# Patient Record
Sex: Female | Born: 1950 | Race: Black or African American | Hispanic: No | Marital: Married | Smoking: Current every day smoker
Health system: Southern US, Community
[De-identification: ages and names within clinical notes are randomized; demographics above are authoritative.]

## PROBLEM LIST (undated history)

## (undated) DIAGNOSIS — N321 Vesicointestinal fistula: Secondary | ICD-10-CM

## (undated) DIAGNOSIS — K5792 Diverticulitis of intestine, part unspecified, without perforation or abscess without bleeding: Secondary | ICD-10-CM

## (undated) DIAGNOSIS — G459 Transient cerebral ischemic attack, unspecified: Secondary | ICD-10-CM

## (undated) DIAGNOSIS — K635 Polyp of colon: Secondary | ICD-10-CM

## (undated) DIAGNOSIS — J432 Centrilobular emphysema: Secondary | ICD-10-CM

## (undated) DIAGNOSIS — E785 Hyperlipidemia, unspecified: Secondary | ICD-10-CM

## (undated) HISTORY — PX: SPINE SURGERY: SHX786

## (undated) HISTORY — PX: CHOLECYSTECTOMY: SHX55

## (undated) HISTORY — PX: PARTIAL COLECTOMY: SHX5273

---

## 1999-10-07 ENCOUNTER — Encounter: Admission: RE | Admit: 1999-10-07 | Discharge: 1999-10-07 | Payer: Self-pay | Admitting: *Deleted

## 1999-10-07 ENCOUNTER — Encounter: Payer: Self-pay | Admitting: *Deleted

## 2000-09-30 ENCOUNTER — Other Ambulatory Visit: Admission: RE | Admit: 2000-09-30 | Discharge: 2000-09-30 | Payer: Self-pay | Admitting: Gynecology

## 2000-10-07 ENCOUNTER — Encounter: Payer: Self-pay | Admitting: Gynecology

## 2000-10-07 ENCOUNTER — Encounter: Admission: RE | Admit: 2000-10-07 | Discharge: 2000-10-07 | Payer: Self-pay | Admitting: Gynecology

## 2000-10-26 ENCOUNTER — Other Ambulatory Visit: Admission: RE | Admit: 2000-10-26 | Discharge: 2000-10-26 | Payer: Self-pay | Admitting: Gynecology

## 2001-10-18 ENCOUNTER — Encounter: Admission: RE | Admit: 2001-10-18 | Discharge: 2001-10-18 | Payer: Self-pay | Admitting: Gynecology

## 2001-10-18 ENCOUNTER — Encounter: Payer: Self-pay | Admitting: Gynecology

## 2001-11-01 ENCOUNTER — Other Ambulatory Visit: Admission: RE | Admit: 2001-11-01 | Discharge: 2001-11-01 | Payer: Self-pay | Admitting: Gynecology

## 2002-10-21 ENCOUNTER — Encounter: Admission: RE | Admit: 2002-10-21 | Discharge: 2002-10-21 | Payer: Self-pay | Admitting: Gynecology

## 2002-10-21 ENCOUNTER — Encounter: Payer: Self-pay | Admitting: Gynecology

## 2003-01-25 ENCOUNTER — Other Ambulatory Visit: Admission: RE | Admit: 2003-01-25 | Discharge: 2003-01-25 | Payer: Self-pay | Admitting: Gynecology

## 2003-10-27 ENCOUNTER — Ambulatory Visit (HOSPITAL_COMMUNITY): Admission: RE | Admit: 2003-10-27 | Discharge: 2003-10-27 | Payer: Self-pay | Admitting: Obstetrics and Gynecology

## 2004-02-01 ENCOUNTER — Other Ambulatory Visit: Admission: RE | Admit: 2004-02-01 | Discharge: 2004-02-01 | Payer: Self-pay | Admitting: Gynecology

## 2004-09-16 ENCOUNTER — Encounter (INDEPENDENT_AMBULATORY_CARE_PROVIDER_SITE_OTHER): Payer: Self-pay | Admitting: *Deleted

## 2004-09-16 ENCOUNTER — Ambulatory Visit (HOSPITAL_COMMUNITY): Admission: RE | Admit: 2004-09-16 | Discharge: 2004-09-16 | Payer: Self-pay | Admitting: Gastroenterology

## 2004-10-29 ENCOUNTER — Ambulatory Visit (HOSPITAL_COMMUNITY): Admission: RE | Admit: 2004-10-29 | Discharge: 2004-10-29 | Payer: Self-pay | Admitting: Gynecology

## 2004-11-11 ENCOUNTER — Encounter: Admission: RE | Admit: 2004-11-11 | Discharge: 2004-11-11 | Payer: Self-pay | Admitting: Internal Medicine

## 2005-01-16 ENCOUNTER — Encounter (INDEPENDENT_AMBULATORY_CARE_PROVIDER_SITE_OTHER): Payer: Self-pay | Admitting: Specialist

## 2005-01-16 ENCOUNTER — Inpatient Hospital Stay (HOSPITAL_COMMUNITY): Admission: RE | Admit: 2005-01-16 | Discharge: 2005-01-21 | Payer: Self-pay | Admitting: General Surgery

## 2005-03-05 ENCOUNTER — Other Ambulatory Visit: Admission: RE | Admit: 2005-03-05 | Discharge: 2005-03-05 | Payer: Self-pay | Admitting: Gynecology

## 2005-10-31 ENCOUNTER — Ambulatory Visit (HOSPITAL_COMMUNITY): Admission: RE | Admit: 2005-10-31 | Discharge: 2005-10-31 | Payer: Self-pay | Admitting: Gynecology

## 2006-01-20 ENCOUNTER — Ambulatory Visit (HOSPITAL_COMMUNITY): Admission: RE | Admit: 2006-01-20 | Discharge: 2006-01-20 | Payer: Self-pay | Admitting: Internal Medicine

## 2006-03-11 ENCOUNTER — Other Ambulatory Visit: Admission: RE | Admit: 2006-03-11 | Discharge: 2006-03-11 | Payer: Self-pay | Admitting: Gynecology

## 2006-11-03 ENCOUNTER — Ambulatory Visit (HOSPITAL_COMMUNITY): Admission: RE | Admit: 2006-11-03 | Discharge: 2006-11-03 | Payer: Self-pay | Admitting: Gynecology

## 2007-01-05 ENCOUNTER — Encounter: Admission: RE | Admit: 2007-01-05 | Discharge: 2007-01-05 | Payer: Self-pay | Admitting: Internal Medicine

## 2007-03-15 ENCOUNTER — Other Ambulatory Visit: Admission: RE | Admit: 2007-03-15 | Discharge: 2007-03-15 | Payer: Self-pay | Admitting: Gynecology

## 2007-11-11 ENCOUNTER — Ambulatory Visit (HOSPITAL_COMMUNITY): Admission: RE | Admit: 2007-11-11 | Discharge: 2007-11-11 | Payer: Self-pay | Admitting: Gynecology

## 2007-12-02 ENCOUNTER — Encounter: Admission: RE | Admit: 2007-12-02 | Discharge: 2007-12-02 | Payer: Self-pay | Admitting: Interventional Radiology

## 2008-03-16 ENCOUNTER — Other Ambulatory Visit: Admission: RE | Admit: 2008-03-16 | Discharge: 2008-03-16 | Payer: Self-pay | Admitting: Gynecology

## 2008-11-13 ENCOUNTER — Ambulatory Visit (HOSPITAL_COMMUNITY): Admission: RE | Admit: 2008-11-13 | Discharge: 2008-11-13 | Payer: Self-pay | Admitting: Gynecology

## 2008-12-20 ENCOUNTER — Ambulatory Visit (HOSPITAL_COMMUNITY): Admission: RE | Admit: 2008-12-20 | Discharge: 2008-12-20 | Payer: Self-pay | Admitting: Gynecology

## 2009-01-31 ENCOUNTER — Ambulatory Visit: Admission: RE | Admit: 2009-01-31 | Discharge: 2009-01-31 | Payer: Self-pay | Admitting: Gynecologic Oncology

## 2009-11-14 ENCOUNTER — Ambulatory Visit (HOSPITAL_COMMUNITY): Admission: RE | Admit: 2009-11-14 | Discharge: 2009-11-14 | Payer: Self-pay | Admitting: Gynecology

## 2010-02-12 ENCOUNTER — Encounter: Admission: RE | Admit: 2010-02-12 | Discharge: 2010-02-12 | Payer: Self-pay | Admitting: Internal Medicine

## 2010-11-19 ENCOUNTER — Other Ambulatory Visit (HOSPITAL_COMMUNITY): Payer: Self-pay | Admitting: Gynecology

## 2010-11-19 DIAGNOSIS — Z1231 Encounter for screening mammogram for malignant neoplasm of breast: Secondary | ICD-10-CM

## 2010-11-21 ENCOUNTER — Ambulatory Visit (HOSPITAL_COMMUNITY)
Admission: RE | Admit: 2010-11-21 | Discharge: 2010-11-21 | Disposition: A | Discharge status: Home / Self Care | Payer: BC Managed Care – PPO | Source: Ambulatory Visit | Attending: Gynecology | Admitting: Gynecology

## 2010-11-21 DIAGNOSIS — Z1231 Encounter for screening mammogram for malignant neoplasm of breast: Secondary | ICD-10-CM | POA: Insufficient documentation

## 2011-02-11 NOTE — Consult Note (Signed)
NAME:  Ashley Galloway, BROSIOUS NO.:  [id number]   MEDICAL RECORD NO.:  [id number]          PATIENT TYPE:  OUT   LOCATION:  GYN                          FACILITY:  [facility]   PHYSICIAN:  John T. Kyla Balzarine, M.D.    DATE OF BIRTH:  04-May-1951   DATE OF CONSULTATION:  DATE OF DISCHARGE:                                 CONSULTATION   CHIEF COMPLAINT:  This 60 year old nulligravida is seen at the request  of Dr. Chevis Pretty for a cyst with borderline CA-125 value.   HISTORY OF PRESENT ILLNESS:  The patient relates menopause at  approximately age 52.  She was on variety of HRT regimens including oral  estrogen and progesterone and a more recent estrogen patch.  She  discontinued this and had an episode of spotting.  Evaluation included  endometrial biopsy showing disordered proliferative endometrium and  ultrasound performed, March 24 that revealed small fibroids in the  posterior uterine wall ranging between 1.3 and 2.5 cm in maximum  diameter.  The endometrial thickness was 4 mm.  The right ovary  contained a complex cystic lesion with thin internal septations  measuring 3.2 x 2.8 x 2.9 cm with no internal blood flow identified and  no thickened septations, mural nodularity, or solid components.  The  left ovary was normal and there was no evidence of free fluid.  CA-125  value was 32.9.  On close questioning, the patient is otherwise  asymptomatic, with no further episodes of menopausal bleeding.  She has  negligible vasomotor symptoms since discontinuing HRT.  Bowel and  bladder functions are normal and she denies bloating or early satiety.  There is no family history suggesting BRCA mutation.  The patient used  oral contraceptives for 15 years prior to menopause and underwent BTL in  1989.   PAST MEDICAL HISTORY:  Hypertension, hyperlipidemia, prior tubal  ligation, open cholecystectomy in 2006, and partial colonic resection in  2006.  Remote T&A.   MEDICATIONS:  Triamterene/HCTZ,  lovastatin, potassium supplementation,  low-dose aspirin, and multivitamins.   ALLERGIES:  None known.   PERSONAL SOCIAL HISTORY:  Married, smokes one-half pack per day tobacco,  and denies illicit drug or ethanol use.   FAMILY HISTORY:  No know gynecologic malignancies; maternal first cousin  had breast cancer, but several maternal aunts and the patient's mother  had no malignancies.   REVIEW OF SYSTEMS:  Other than noted above, negative 10-point system  review.   PHYSICAL EXAMINATION:  VITAL SIGNS:  Weight 178 pounds and vital signs  stable/afebrile.  GENERAL:  The patient is anxious, alert, and oriented x3, in no acute  distress.  ENT:  Clear oropharynx, supple neck without goiter, and no scleral  icterus.  LUNGS:  Lung fields are clear.  HEART:  Regular rhythm with no JVD.  ABDOMEN:  Soft and benign with well-healed surgical incisions.  No  hernia, tenderness, ascites, or mass.  EXTREMITIES:  Full strength and range of motion with no cords, Homans,  or edema.  NEUROLOGIC:  Screen intact.  SKIN:  Without suspicious lesions.  PELVIC:  External genitalia, BUS, bladder, and urethra  are normal.  The  vagina and cervix are clear.  The cervix is mobile without tenderness or  lesion.  Bimanual rectovaginal examinations, uterus is upper limits in  size, there is vague fullness in the right adnexa, but no distinct mass.  There is no cul-de-sac nodularity in the contralateral adnexa.  The  adnexum is normal.   LABORATORY DATA:  Review of Dr. Corwin Levins records confirm CA-125 value and  ultrasound report.   ASSESSMENT:  A 3-cm and benign-appearing ovarian cyst with borderline CA-  125 value (32.9).   PLAN AND RECOMMENDATIONS:  I had a long talk with the patient and her  husband regarding our differential diagnosis and potential causes for  elevation of CA-125, including ovarian cancer of various benign  conditions.  It is likely that she has functional cyst of the ovary   precipitated by withdrawal of HRT.  I recommended that the patient  undergo followup ultrasound scan in approximately 8 weeks; if the cyst  is still present and CA-125 values following, she could then simply be  observed with a followup scan in approximately 6 months; if the cyst is  resolved no further followup would be required.  If her CA-125 is  elevated above 100, cyst enlarging are looking a little more worrisome,  I would then recommend an operative intervention.  To keep imaging  consistent, we would recommend that she follow up with Dr. Chevis Pretty and we  would be glad to see her back at any time on a p.r.n. basis.      John T. Kyla Balzarine, M.D.  Electronically Signed     JTS/MEDQ  D:  01/31/2009  T:  02/01/2009  Job:  [id number]   cc:   Deirdre Peer. Polite, M.D.   Leatha Gilding. Mezer, M.D.  Fax: [phone]   Telford Nab, R.N.  [address]  [place], [place] [address]

## 2011-02-14 NOTE — Op Note (Signed)
NAME:  Ashley Galloway, Ashley Galloway              ACCOUNT NO.:  [id number]   MEDICAL RECORD NO.:  [id number]          PATIENT TYPE:  AMB   LOCATION:  ENDO                         FACILITY:  [facility]   PHYSICIAN:  John C. Madilyn Fireman, M.D.    DATE OF BIRTH:  25-Apr-1951   DATE OF PROCEDURE:  09/16/2004  DATE OF DISCHARGE:                                 OPERATIVE REPORT   PROCEDURE:  Colonoscopy with polypectomy and biopsy.   INDICATIONS FOR PROCEDURE:  Average risk colon cancer screening.   DESCRIPTION OF PROCEDURE:  The patient was placed in the left lateral  decubitus position then placed on the pulse monitor with continuous low flow  oxygen delivered by nasal cannula. She was sedated with 75 mcg IV fentanyl  and 7 mg IV Versed. The Olympus video colonoscope was inserted into the  rectum and advanced to the cecum, confirmed by transillumination at  McBurney's point and visualization of the ileocecal valve and appendiceal  orifice. The prep was excellent. The cecum appeared normal with no masses,  polyps, diverticula or other mucosal abnormalities.  Within the ascending  colon, there was a soft, diffuse,  multilobulated somewhat complex polypoid  mass approximately 2 x 4 cm in diameter that was sessile with no stalk and  did not appear to be resectable endoscopically.  Multiple biopsies were  taken.  Overall, it had the appearance of a complex adenoma but had somewhat  atypical looking surface.  The remainder of the ascending and transverse  colon appeared normal with no further polyps, masses, diverticula or other  mucosal abnormalities.  Within the descending colon, there was a 1.5 cm  polyp at 40 cm removed by snare. A 1 cm polyp at 30 cm was also removed by  snare and sent in a separate specimen container. A 2 x 1.5 cm pedunculated  polyp was seen at 22 cm and sent in a third specimen container.  The  remainder of the rectosigmoid and rectum appeared normal with no further  abnormalities.  The scope was  then withdrawn and the patient returned to the  recovery room in stable condition.  She tolerated the procedure well and  there were no immediate complications.   IMPRESSION:  1.  Ascending colon polypoid mass doubt resectable endoscopically.  2.  Descending sigmoid and rectosigmoid polyps.   PLAN:  Await all biopsy results and if ascending colon polyp adenomatous  will probably need surgical resection.      JCH/MEDQ  D:  09/16/2004  T:  09/16/2004  Job:  [id number]   cc:   Darius Bump, M.D.  [address]  [place] [address]  Fax: [phone]

## 2011-02-14 NOTE — Op Note (Signed)
NAME:  Ashley Galloway, Ashley Galloway NO.:  [id number]   MEDICAL RECORD NO.:  [id number]          PATIENT TYPE:  INP   LOCATION:  0001                         FACILITY:  [facility]   PHYSICIAN:  Adolph Pollack, M.D.DATE OF BIRTH:  [birth year]   DATE OF PROCEDURE:  01/16/2005  DATE OF DISCHARGE:                                 OPERATIVE REPORT   PREOPERATIVE DIAGNOSES:  1.  Symptomatic cholelithiasis.  2.  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   POSTOPERATIVE DIAGNOSES:  1.  Symptomatic cholelithiasis.  2.  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   OPERATION/PROCEDURE:  1.  Laparoscopic cholecystectomy with a intraoperative cholangiogram.  2.  Laparoscopic-assisted right colectomy.   SURGEON:  Adolph Pollack, M.D.   ASSISTANT:  Leonie Man, M.D.   ANESTHESIA:  General.   INDICATIONS:  The patient is a 60 year old female who underwent screening  colonoscopy and has a large tubulovillous polyp in the right colon they  cannot removed endoscopically.  She also has symptomatic cholelithiasis.  She presents for the above procedures.   DESCRIPTION OF PROCEDURE:  She is seen in the holding area and then brought  to the operating room, placed supine on the operating table and  general  anesthetic was administered.  Foley catheter was placed in the bladder.  The  abdominal wall was sterilely prepped and draped.  A small subumbilical  incision was made through the skin and subcutaneous tissue, fascia, and  peritoneum layers.  Peritoneal cavity was entered under direct vision.  A  pursestring suture of 0 Vicryl was placed around the fascial edges.  A  Hasson trocar was introduced into the peritoneal cavity and pneumoperitoneum  was created by insufflation of the CO2 gas.   Next, a laparoscope was introduced.  Under direct vision a 10/11 mm trocar  was placed in the epigastrium to the left of midline.  Two 5 mm trocars were  then placed through right mid  lateral abdomen.  The patient was placed in  reverse Trendelenburg position, right side tilted slightly up.  The  gallbladder was very firm and intrahepatic.  It was difficult to grasp.  We  began trying to incise some of the peritoneum near the body of the  gallbladder.  The infundibulum went fairly close to the porta hepatis.  I  subsequently used the Harmonic scalpel and began at the dome of the fundus  of the gallbladder and began dissecting the fundus free with the Harmonic  scalpel.  A branch of the cystic artery immediately on the gallbladder began  bleeding and I clipped this.  I continued to go ahead and free the  gallbladder fundus first down to the body and infundibulum.  The gallbladder  was very firm.  We did enter the gallbladder and some stones were spilled  but I retrieved these.  There was also a very large stone impacted in the  neck of the gallbladder.  I then used very careful dissection to identify a  cystic entry branch of the cystic artery, clip it and divide it after  creating a window around it.  I used careful blunt dissection and continued  to free the gallbladder up until the only thing holding the gallbladder was  a cystic duct.  It was very short cystic duct as I could see its junction  with the common bile duct.  A small incision was made at the gallbladder-  cystic duct junction and a cholangiocatheter placed into the cystic duct and  cholangiogram was performed.   Under real time fluoroscopy, dilute contrast material was injected into the  cystic duct.  It was a very short cystic duct.  The common bile duct, common  hepatic and right and left hepatic ducts were all calcified.  The contrast  spilled rapidly into the duodenum.  There was no obvious evidence of  obstruction.  Final report is pending the radiologist's interpretation.   I removed the Cholangiocath.  I was able to place three clips proximally on  the cystic duct and divide it sharply.  The  gallbladder was placed in an  Endopouch bag.  I irrigated the raw surface of the liver.  Hemostasis was  actually adequate.  I did note no bile leak.  I put two pieces of Surgicel  on the raw surface of the liver.  The gallbladder was then removed to the  subumbilical port.  Irrigation was performed in the perihepatic area.  Blood  loss for the cholecystectomy was approximately 200 mL given the fact that  the gallbladder completely intrahepatic and we had to dissect it to the  liver.   Following this, I placed a 5 mm trocar in the left mid lateral abdomen.  I  then approached the hepatic flexure of the colon and divided the lateral  attachments using the Harmonic scalpel.  I then mobilized the right colon by  dividing the white line of Toldt and medializing the colon.  I further  mobilized the hepatic flexure and proximal transverse colon using the  Harmonic scalpel.  Once I felt I had adequate mobility, I removed the two 5  mm right mid abdominal trocars and made an incision between the two through  the skin and subcutaneous tissue and anterior fascia.  I retracted the  rectus muscle medially and divided the posterior fascia and perineum  entering the peritoneal cavity.  I then brought the cecum and distal ileum  up into the wound.  I divided the distal ileum just proximal to the anterior  mesenteric fatty area with the endo-GIA stapler and divided part of the  mesentery in between clamps and ligated the vessels with Vicryl ties.  I  subsequently then placed a stay suture on the staple line of the distal  ileum and dropped it back to the peritoneal cavity.  I then mobilized the  right colon and the proximal transverse colon up into the wound.  I  dissected the omentum away from the proximal transverse colon.  I divided  the proximal transverse colon with the endo-GIA stapler.  The remaining mesentery was divided between clamps and vessels were ligated.  The specimen  was taken off the  field.  I subsequently took the specimen to the back  table, opened it up and identified the large polypoid mass in the midline  ascending colon.   Gloves were then changed.  A side-to-side stabled anastomosis was then  performed with a layered cutting stapler.  The remaining enterotomy was  closed with a linear non-cutting stapler and this site was also oversewn  with real silk sutures.  I then reinforced the  distal portion of the  anterior staple line with a 3-0 silk suture.  The anastomosis was patent,  viable and under no tension.  It was dropped back into the abdominal cavity.   Again, gloves were changed.  The abdominal cavity was then irrigated.  I did  not detect any active bleeding.  Sponges were all removed from the abdominal  cavity, counted and correct.  I then closed the posterior fascia of the  extraction site incision with a running #1 PDS suture.  Anterior fascia was  also closed with a running #1 PDS suture.   The abdomen was reinsufflated.  The anastomosis was seen and a closure was  seen.  Minimal retained irrigation fluid was present and this was evacuated.  I looked up in the gallbladder fossa.  No bleeding or bile leakage was  noted.  I subsequently removed the remaining trocars.  The subumbilical  fascia defect was closed by tightening up and tying down the pursestring  suture.  The wounds were then irrigated and skin incisions closed with  staples followed by sterile dressings.   She tolerated the procedure well without any apparent complications.  Estimated blood loss was approximately 400 mL.  She was taken to the  recovery room in satisfactory condition.      TJR/MEDQ  D:  01/16/2005  T:  01/16/2005  Job:  [id number]   cc:   Darius Bump, M.D.  [address]  [place] [address]  Fax: [phone]   Everardo All. Madilyn Fireman, M.D.  [address]  [place]  [place] [address]  Fax: [phone]

## 2011-02-14 NOTE — H&P (Signed)
NAME:  Ashley Galloway, Ashley Galloway NO.:  [id number]   MEDICAL RECORD NO.:  [id number]          PATIENT TYPE:  INP   LOCATION:  0001                         [facility]ACILITY:  [facility]   PHYSICIAN:  Adolph Pollack, M.D.DATE OF BIRTH:  [birth year]   DATE OF ADMISSION:  01/16/2005  DATE OF DISCHARGE:                                HISTORY & PHYSICAL   REASON:  Elective cholecystectomy and partial colectomy.   HISTORY OF PRESENT ILLNESS:  Ashley Galloway is a 60 year old female who had a  screening colonoscopy and had a large sessile type polyp in the ascending  colon which could not be removed by way of colonoscopy.  It was biopsied and  showed a tubulovillous adenoma.  She has also been having some right upper  quadrant and has been known to have known gallstones and symptomatic  cholelithiasis.  She now presents for laparoscopic cholecystectomy and  laparoscopic assisted partial colectomy.   PAST MEDICAL HISTORY:  1.  Hypertension.  2.  Hyperlipidemia.  3.  Hormonal deficiency.   PAST SURGICAL HISTORY:  1.  Tubal ligation.  2.  Tonsillectomy.  3.  Repair of hammer toe and bunionectomy.   ALLERGIES:  None.   MEDICATIONS:  1.  Aspirin.  2.  Potassium.  3.  Triamterene/hydrochlorothiazide.  4.  Lovastatin.  5.  Hormone replacement.   SOCIAL HISTORY:  She is a former smoker.  No alcohol use.  Works for  [employer].   FAMILY HISTORY:  Positive for sickle cell disease, diabetes.  No colon  cancer.   REVIEW OF SYSTEMS:  Only positive for intermittent vaginal spotting if she  is not on her female hormone replacement.   PHYSICAL EXAMINATION:  GENERAL:  Moderately obese female in no acute  distress, pleasant and cooperative.  VITAL SIGNS:  Temperature, heart rate 58, blood pressure 110/60.  She is  about 5 feet 7 inches tall, weighs 203 pounds.  HEENT:  Eyes:  Extraocular motions intact.  No icterus.  NECK:  Supple without masses or obvious thyroid enlargement.  RESPIRATORY:  The breath sounds are equal and clear.  Respirations are  unlabored.  CARDIOVASCULAR:  Regular rate and rhythm.  No murmur heard.  No lower  extremity edema.  ABDOMEN:  Soft.  Small subumbilical incision.  No palpable masses.  No  organomegaly.  No hernias.  EXTREMITIES:  Full range of motion.   IMPRESSION:  1.  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  2.  Symptomatic cholelithiasis.  3.  Hypertension, well-controlled.   PLAN:  Laparoscopic cholecystectomy with cholangiogram and laparoscopic  assisted right colectomy.  We discussed the procedure and the risks  preoperatively.      TJR/MEDQ  D:  01/16/2005  T:  01/16/2005  Job:  [id number]

## 2011-02-14 NOTE — Discharge Summary (Signed)
NAME:  [name], Ashley Galloway NO.:  [id number]   MEDICAL RECORD NO.:  [id number]          PATIENT TYPE:  INP   LOCATION:  0342                         FACILITY:  [facility]   PHYSICIAN:  Adolph Pollack, M.D.DATE OF BIRTH:  [birth year]   DATE OF ADMISSION:  01/16/2005  DATE OF DISCHARGE:  01/21/2005                                 DISCHARGE SUMMARY   PRINCIPAL DISCHARGE DIAGNOSIS:  Tubulovillous adenoma of the right colon.   SECONDARY DIAGNOSES:  1.  Chronic calculus cholecystitis.  2.  Blood loss anemia.  3.  Hypertension.  4.  Mild postoperative ileus.   PROCEDURE:  Laparoscopic-assisted right colectomy and laparoscopic  cholecystectomy with intraoperative cholangiogram on January 16, 2005.   HISTORY OF PRESENT ILLNESS:  [name] is a 60 year old female who on  screening colonoscopy was found to have a large sessile-type polyp in the  ascending colon that could not be removed completely.  It showed a  tubulovillous adenoma.  She also has symptomatic cholelithiasis.  She was  admitted for the above procedures.   HOSPITAL COURSE:  She underwent the above procedures and tolerated these  well.  She did have a little bit of blood loss anemia with a hemoglobin of  10.9 but tolerated this well.  Her ileus slowly resolved, such that by her  third postoperative day she was taking full liquids.  She was tolerating  these well.  By her fifth postoperative day her bowels were moving.  She was  tolerating a side diet.  Her incisions were clean and intact.  She is able  to be discharged.   DISPOSITION:  Discharged to home on January 21, 2005.   FOLLOWUP:  She is to return to the office in a few days for staple removal.   DISCHARGE INSTRUCTIONS:  She is given discharge instructions as well as  medication for pain.   CONDITION ON DISCHARGE:  Satisfactory.      TJR/MEDQ  D:  02/04/2005  T:  02/04/2005  Job:  [id number]   cc:   Darius Bump, M.D.  [address]  [place] [address]  Fax: [phone]   Everardo All. Madilyn Fireman, M.D.  [address]  [place]  [place] [address]  Fax: [phone]

## 2011-11-24 ENCOUNTER — Other Ambulatory Visit: Payer: Self-pay | Admitting: Gynecology

## 2011-11-24 DIAGNOSIS — Z1231 Encounter for screening mammogram for malignant neoplasm of breast: Secondary | ICD-10-CM

## 2011-12-03 ENCOUNTER — Other Ambulatory Visit (HOSPITAL_COMMUNITY): Payer: Self-pay | Admitting: Gynecology

## 2011-12-03 DIAGNOSIS — Z1231 Encounter for screening mammogram for malignant neoplasm of breast: Secondary | ICD-10-CM

## 2011-12-29 ENCOUNTER — Ambulatory Visit (HOSPITAL_COMMUNITY)
Admission: RE | Admit: 2011-12-29 | Discharge: 2011-12-29 | Disposition: A | Discharge status: Home / Self Care | Payer: BC Managed Care – PPO | Source: Ambulatory Visit | Attending: Gynecology | Admitting: Gynecology

## 2011-12-29 DIAGNOSIS — Z1231 Encounter for screening mammogram for malignant neoplasm of breast: Secondary | ICD-10-CM | POA: Insufficient documentation

## 2012-12-13 ENCOUNTER — Other Ambulatory Visit (HOSPITAL_COMMUNITY): Payer: Self-pay | Admitting: Gynecology

## 2012-12-13 DIAGNOSIS — Z1231 Encounter for screening mammogram for malignant neoplasm of breast: Secondary | ICD-10-CM

## 2012-12-29 ENCOUNTER — Ambulatory Visit (HOSPITAL_COMMUNITY)
Admission: RE | Admit: 2012-12-29 | Discharge: 2012-12-29 | Disposition: A | Discharge status: Home / Self Care | Payer: BC Managed Care – PPO | Source: Ambulatory Visit | Attending: Gynecology | Admitting: Gynecology

## 2012-12-29 DIAGNOSIS — Z1231 Encounter for screening mammogram for malignant neoplasm of breast: Secondary | ICD-10-CM

## 2013-12-30 ENCOUNTER — Other Ambulatory Visit (HOSPITAL_COMMUNITY): Payer: Self-pay | Admitting: Gynecology

## 2013-12-30 DIAGNOSIS — Z1231 Encounter for screening mammogram for malignant neoplasm of breast: Secondary | ICD-10-CM

## 2014-01-06 ENCOUNTER — Ambulatory Visit (HOSPITAL_COMMUNITY)
Admission: RE | Admit: 2014-01-06 | Discharge: 2014-01-06 | Disposition: A | Discharge status: Home / Self Care | Payer: BC Managed Care – PPO | Source: Ambulatory Visit | Attending: Gynecology | Admitting: Gynecology

## 2014-01-06 DIAGNOSIS — Z1231 Encounter for screening mammogram for malignant neoplasm of breast: Secondary | ICD-10-CM | POA: Insufficient documentation

## 2015-01-15 ENCOUNTER — Other Ambulatory Visit (HOSPITAL_COMMUNITY): Payer: Self-pay | Admitting: Internal Medicine

## 2015-01-15 DIAGNOSIS — Z1231 Encounter for screening mammogram for malignant neoplasm of breast: Secondary | ICD-10-CM

## 2015-01-16 ENCOUNTER — Ambulatory Visit (HOSPITAL_COMMUNITY)
Admission: RE | Admit: 2015-01-16 | Discharge: 2015-01-16 | Disposition: A | Discharge status: Home / Self Care | Payer: BLUE CROSS/BLUE SHIELD | Source: Ambulatory Visit | Attending: Internal Medicine | Admitting: Internal Medicine

## 2015-01-16 DIAGNOSIS — Z1231 Encounter for screening mammogram for malignant neoplasm of breast: Secondary | ICD-10-CM | POA: Diagnosis not present

## 2015-04-27 ENCOUNTER — Ambulatory Visit (HOSPITAL_COMMUNITY)
Admission: RE | Admit: 2015-04-27 | Discharge: 2015-04-27 | Disposition: A | Discharge status: Home / Self Care | Payer: BLUE CROSS/BLUE SHIELD | Source: Ambulatory Visit | Attending: Internal Medicine | Admitting: Internal Medicine

## 2015-04-27 ENCOUNTER — Other Ambulatory Visit (HOSPITAL_COMMUNITY): Payer: Self-pay | Admitting: Internal Medicine

## 2015-04-27 DIAGNOSIS — M79604 Pain in right leg: Secondary | ICD-10-CM | POA: Diagnosis not present

## 2015-04-27 DIAGNOSIS — R52 Pain, unspecified: Secondary | ICD-10-CM | POA: Diagnosis not present

## 2015-04-27 NOTE — Progress Notes (Signed)
VASCULAR LAB PRELIMINARY  PRELIMINARY  PRELIMINARY  PRELIMINARY  Right lower extremity venous duplex completed.    Preliminary report:  Right:  No evidence of DVT, superficial thrombosis, or Baker's cyst.  Ulises Wolfinger, RVT 04/27/2015, 6:23 PM

## 2015-05-15 ENCOUNTER — Other Ambulatory Visit: Payer: Self-pay | Admitting: Gynecology

## 2015-05-16 LAB — CYTOLOGY - PAP

## 2015-05-31 ENCOUNTER — Other Ambulatory Visit: Payer: Self-pay | Admitting: Internal Medicine

## 2015-05-31 DIAGNOSIS — R29898 Other symptoms and signs involving the musculoskeletal system: Secondary | ICD-10-CM

## 2015-06-12 ENCOUNTER — Ambulatory Visit
Admission: RE | Admit: 2015-06-12 | Discharge: 2015-06-12 | Disposition: A | Discharge status: Home / Self Care | Payer: BLUE CROSS/BLUE SHIELD | Source: Ambulatory Visit | Attending: Internal Medicine | Admitting: Internal Medicine

## 2015-06-12 DIAGNOSIS — R29898 Other symptoms and signs involving the musculoskeletal system: Secondary | ICD-10-CM

## 2016-01-02 ENCOUNTER — Other Ambulatory Visit: Payer: Self-pay | Admitting: Internal Medicine

## 2016-01-02 DIAGNOSIS — E2839 Other primary ovarian failure: Secondary | ICD-10-CM

## 2016-01-02 DIAGNOSIS — Z1231 Encounter for screening mammogram for malignant neoplasm of breast: Secondary | ICD-10-CM

## 2016-01-22 ENCOUNTER — Other Ambulatory Visit: Payer: BLUE CROSS/BLUE SHIELD

## 2016-01-22 ENCOUNTER — Ambulatory Visit: Payer: BLUE CROSS/BLUE SHIELD

## 2016-01-25 ENCOUNTER — Ambulatory Visit
Admission: RE | Admit: 2016-01-25 | Discharge: 2016-01-25 | Disposition: A | Discharge status: Home / Self Care | Payer: BLUE CROSS/BLUE SHIELD | Source: Ambulatory Visit | Attending: Internal Medicine | Admitting: Internal Medicine

## 2016-01-25 DIAGNOSIS — Z1231 Encounter for screening mammogram for malignant neoplasm of breast: Secondary | ICD-10-CM

## 2016-01-25 DIAGNOSIS — E2839 Other primary ovarian failure: Secondary | ICD-10-CM

## 2016-04-29 DIAGNOSIS — R69 Illness, unspecified: Secondary | ICD-10-CM | POA: Diagnosis not present

## 2016-04-29 DIAGNOSIS — E7800 Pure hypercholesterolemia, unspecified: Secondary | ICD-10-CM | POA: Diagnosis not present

## 2016-04-29 DIAGNOSIS — L989 Disorder of the skin and subcutaneous tissue, unspecified: Secondary | ICD-10-CM | POA: Diagnosis not present

## 2016-04-29 DIAGNOSIS — I10 Essential (primary) hypertension: Secondary | ICD-10-CM | POA: Diagnosis not present

## 2016-06-20 DIAGNOSIS — L821 Other seborrheic keratosis: Secondary | ICD-10-CM | POA: Diagnosis not present

## 2016-06-20 DIAGNOSIS — L820 Inflamed seborrheic keratosis: Secondary | ICD-10-CM | POA: Diagnosis not present

## 2016-06-20 DIAGNOSIS — L918 Other hypertrophic disorders of the skin: Secondary | ICD-10-CM | POA: Diagnosis not present

## 2016-06-20 DIAGNOSIS — D2371 Other benign neoplasm of skin of right lower limb, including hip: Secondary | ICD-10-CM | POA: Diagnosis not present

## 2016-06-20 DIAGNOSIS — D2271 Melanocytic nevi of right lower limb, including hip: Secondary | ICD-10-CM | POA: Diagnosis not present

## 2016-06-22 DIAGNOSIS — R69 Illness, unspecified: Secondary | ICD-10-CM | POA: Diagnosis not present

## 2016-09-02 DIAGNOSIS — R69 Illness, unspecified: Secondary | ICD-10-CM | POA: Diagnosis not present

## 2016-09-06 DIAGNOSIS — I6529 Occlusion and stenosis of unspecified carotid artery: Secondary | ICD-10-CM | POA: Diagnosis not present

## 2016-09-06 DIAGNOSIS — Z0000 Encounter for general adult medical examination without abnormal findings: Secondary | ICD-10-CM | POA: Diagnosis not present

## 2016-09-06 DIAGNOSIS — E7800 Pure hypercholesterolemia, unspecified: Secondary | ICD-10-CM | POA: Diagnosis not present

## 2016-09-06 DIAGNOSIS — I10 Essential (primary) hypertension: Secondary | ICD-10-CM | POA: Diagnosis not present

## 2016-11-19 ENCOUNTER — Other Ambulatory Visit: Payer: Self-pay | Admitting: Internal Medicine

## 2016-11-19 DIAGNOSIS — E7800 Pure hypercholesterolemia, unspecified: Secondary | ICD-10-CM | POA: Diagnosis not present

## 2016-11-19 DIAGNOSIS — F17208 Nicotine dependence, unspecified, with other nicotine-induced disorders: Secondary | ICD-10-CM

## 2016-11-19 DIAGNOSIS — E663 Overweight: Secondary | ICD-10-CM | POA: Diagnosis not present

## 2016-11-19 DIAGNOSIS — R69 Illness, unspecified: Secondary | ICD-10-CM | POA: Diagnosis not present

## 2016-11-19 DIAGNOSIS — I10 Essential (primary) hypertension: Secondary | ICD-10-CM | POA: Diagnosis not present

## 2016-11-19 DIAGNOSIS — Z0000 Encounter for general adult medical examination without abnormal findings: Secondary | ICD-10-CM | POA: Diagnosis not present

## 2016-11-19 DIAGNOSIS — Z6830 Body mass index (BMI) 30.0-30.9, adult: Secondary | ICD-10-CM | POA: Diagnosis not present

## 2016-11-20 ENCOUNTER — Ambulatory Visit
Admission: RE | Admit: 2016-11-20 | Discharge: 2016-11-20 | Disposition: A | Discharge status: Home / Self Care | Payer: Medicare HMO | Source: Ambulatory Visit | Attending: Internal Medicine | Admitting: Internal Medicine

## 2016-11-20 DIAGNOSIS — F17208 Nicotine dependence, unspecified, with other nicotine-induced disorders: Secondary | ICD-10-CM

## 2016-11-20 DIAGNOSIS — R69 Illness, unspecified: Secondary | ICD-10-CM | POA: Diagnosis not present

## 2016-12-30 DIAGNOSIS — M5441 Lumbago with sciatica, right side: Secondary | ICD-10-CM | POA: Diagnosis not present

## 2017-01-06 DIAGNOSIS — R69 Illness, unspecified: Secondary | ICD-10-CM | POA: Diagnosis not present

## 2017-01-28 ENCOUNTER — Ambulatory Visit
Admission: RE | Admit: 2017-01-28 | Discharge: 2017-01-28 | Disposition: A | Discharge status: Home / Self Care | Payer: Medicare HMO | Source: Ambulatory Visit | Attending: Internal Medicine | Admitting: Internal Medicine

## 2017-01-28 ENCOUNTER — Other Ambulatory Visit: Payer: Self-pay | Admitting: Internal Medicine

## 2017-01-28 DIAGNOSIS — Z1231 Encounter for screening mammogram for malignant neoplasm of breast: Secondary | ICD-10-CM | POA: Diagnosis not present

## 2017-02-20 ENCOUNTER — Ambulatory Visit: Payer: Medicare HMO

## 2017-03-26 DIAGNOSIS — R69 Illness, unspecified: Secondary | ICD-10-CM | POA: Diagnosis not present

## 2017-03-26 DIAGNOSIS — M5431 Sciatica, right side: Secondary | ICD-10-CM | POA: Diagnosis not present

## 2017-04-09 ENCOUNTER — Other Ambulatory Visit: Payer: Self-pay | Admitting: Internal Medicine

## 2017-04-09 DIAGNOSIS — M545 Low back pain: Secondary | ICD-10-CM | POA: Diagnosis not present

## 2017-04-15 ENCOUNTER — Other Ambulatory Visit: Payer: Self-pay | Admitting: Internal Medicine

## 2017-04-15 ENCOUNTER — Ambulatory Visit
Admission: RE | Admit: 2017-04-15 | Discharge: 2017-04-15 | Disposition: A | Discharge status: Home / Self Care | Payer: Medicare HMO | Source: Ambulatory Visit | Attending: Internal Medicine | Admitting: Internal Medicine

## 2017-04-15 DIAGNOSIS — M47816 Spondylosis without myelopathy or radiculopathy, lumbar region: Secondary | ICD-10-CM | POA: Diagnosis not present

## 2017-04-15 DIAGNOSIS — M5489 Other dorsalgia: Secondary | ICD-10-CM

## 2017-04-18 ENCOUNTER — Ambulatory Visit
Admission: RE | Admit: 2017-04-18 | Discharge: 2017-04-18 | Disposition: A | Discharge status: Home / Self Care | Payer: Medicare HMO | Source: Ambulatory Visit | Attending: Internal Medicine | Admitting: Internal Medicine

## 2017-04-18 DIAGNOSIS — M5126 Other intervertebral disc displacement, lumbar region: Secondary | ICD-10-CM | POA: Diagnosis not present

## 2017-04-18 DIAGNOSIS — M545 Low back pain: Secondary | ICD-10-CM

## 2017-04-18 DIAGNOSIS — M48061 Spinal stenosis, lumbar region without neurogenic claudication: Secondary | ICD-10-CM | POA: Diagnosis not present

## 2017-05-18 DIAGNOSIS — M5416 Radiculopathy, lumbar region: Secondary | ICD-10-CM | POA: Diagnosis not present

## 2017-05-20 DIAGNOSIS — E663 Overweight: Secondary | ICD-10-CM | POA: Diagnosis not present

## 2017-05-20 DIAGNOSIS — M48061 Spinal stenosis, lumbar region without neurogenic claudication: Secondary | ICD-10-CM | POA: Diagnosis not present

## 2017-05-20 DIAGNOSIS — I10 Essential (primary) hypertension: Secondary | ICD-10-CM | POA: Diagnosis not present

## 2017-05-20 DIAGNOSIS — E7800 Pure hypercholesterolemia, unspecified: Secondary | ICD-10-CM | POA: Diagnosis not present

## 2017-05-20 DIAGNOSIS — R69 Illness, unspecified: Secondary | ICD-10-CM | POA: Diagnosis not present

## 2017-05-20 DIAGNOSIS — Z6830 Body mass index (BMI) 30.0-30.9, adult: Secondary | ICD-10-CM | POA: Diagnosis not present

## 2017-05-26 DIAGNOSIS — M545 Low back pain: Secondary | ICD-10-CM | POA: Diagnosis not present

## 2017-05-26 DIAGNOSIS — M6281 Muscle weakness (generalized): Secondary | ICD-10-CM | POA: Diagnosis not present

## 2017-05-26 DIAGNOSIS — M79604 Pain in right leg: Secondary | ICD-10-CM | POA: Diagnosis not present

## 2017-06-04 DIAGNOSIS — M6281 Muscle weakness (generalized): Secondary | ICD-10-CM | POA: Diagnosis not present

## 2017-06-04 DIAGNOSIS — M545 Low back pain: Secondary | ICD-10-CM | POA: Diagnosis not present

## 2017-06-04 DIAGNOSIS — M79604 Pain in right leg: Secondary | ICD-10-CM | POA: Diagnosis not present

## 2017-06-10 DIAGNOSIS — M79604 Pain in right leg: Secondary | ICD-10-CM | POA: Diagnosis not present

## 2017-06-10 DIAGNOSIS — M6281 Muscle weakness (generalized): Secondary | ICD-10-CM | POA: Diagnosis not present

## 2017-06-10 DIAGNOSIS — M545 Low back pain: Secondary | ICD-10-CM | POA: Diagnosis not present

## 2017-06-17 DIAGNOSIS — M79604 Pain in right leg: Secondary | ICD-10-CM | POA: Diagnosis not present

## 2017-06-17 DIAGNOSIS — M6281 Muscle weakness (generalized): Secondary | ICD-10-CM | POA: Diagnosis not present

## 2017-06-17 DIAGNOSIS — M545 Low back pain: Secondary | ICD-10-CM | POA: Diagnosis not present

## 2017-07-01 DIAGNOSIS — Z23 Encounter for immunization: Secondary | ICD-10-CM | POA: Diagnosis not present

## 2017-12-04 ENCOUNTER — Other Ambulatory Visit: Payer: Self-pay | Admitting: Internal Medicine

## 2017-12-04 DIAGNOSIS — R69 Illness, unspecified: Secondary | ICD-10-CM | POA: Diagnosis not present

## 2017-12-04 DIAGNOSIS — E2839 Other primary ovarian failure: Secondary | ICD-10-CM | POA: Diagnosis not present

## 2017-12-04 DIAGNOSIS — F17210 Nicotine dependence, cigarettes, uncomplicated: Secondary | ICD-10-CM

## 2017-12-04 DIAGNOSIS — E663 Overweight: Secondary | ICD-10-CM | POA: Diagnosis not present

## 2017-12-04 DIAGNOSIS — E7800 Pure hypercholesterolemia, unspecified: Secondary | ICD-10-CM | POA: Diagnosis not present

## 2017-12-04 DIAGNOSIS — Z0000 Encounter for general adult medical examination without abnormal findings: Secondary | ICD-10-CM | POA: Diagnosis not present

## 2017-12-04 DIAGNOSIS — I10 Essential (primary) hypertension: Secondary | ICD-10-CM | POA: Diagnosis not present

## 2017-12-04 DIAGNOSIS — I700 Atherosclerosis of aorta: Secondary | ICD-10-CM | POA: Diagnosis not present

## 2017-12-04 DIAGNOSIS — Z1389 Encounter for screening for other disorder: Secondary | ICD-10-CM | POA: Diagnosis not present

## 2017-12-16 DIAGNOSIS — Z0000 Encounter for general adult medical examination without abnormal findings: Secondary | ICD-10-CM | POA: Diagnosis not present

## 2017-12-21 ENCOUNTER — Ambulatory Visit
Admission: RE | Admit: 2017-12-21 | Discharge: 2017-12-21 | Disposition: A | Discharge status: Home / Self Care | Payer: Medicare HMO | Source: Ambulatory Visit | Attending: Internal Medicine | Admitting: Internal Medicine

## 2017-12-21 ENCOUNTER — Other Ambulatory Visit: Payer: Self-pay | Admitting: Internal Medicine

## 2017-12-21 DIAGNOSIS — R69 Illness, unspecified: Secondary | ICD-10-CM | POA: Diagnosis not present

## 2017-12-21 DIAGNOSIS — F17210 Nicotine dependence, cigarettes, uncomplicated: Secondary | ICD-10-CM

## 2017-12-21 DIAGNOSIS — Z1231 Encounter for screening mammogram for malignant neoplasm of breast: Secondary | ICD-10-CM

## 2018-01-04 DIAGNOSIS — R195 Other fecal abnormalities: Secondary | ICD-10-CM | POA: Diagnosis not present

## 2018-01-04 DIAGNOSIS — D509 Iron deficiency anemia, unspecified: Secondary | ICD-10-CM | POA: Diagnosis not present

## 2018-02-02 ENCOUNTER — Ambulatory Visit
Admission: RE | Admit: 2018-02-02 | Discharge: 2018-02-02 | Disposition: A | Discharge status: Home / Self Care | Payer: Medicare HMO | Source: Ambulatory Visit | Attending: Internal Medicine | Admitting: Internal Medicine

## 2018-02-02 DIAGNOSIS — Z1231 Encounter for screening mammogram for malignant neoplasm of breast: Secondary | ICD-10-CM | POA: Diagnosis not present

## 2018-04-06 DIAGNOSIS — M545 Low back pain: Secondary | ICD-10-CM | POA: Diagnosis not present

## 2018-06-08 DIAGNOSIS — R69 Illness, unspecified: Secondary | ICD-10-CM | POA: Diagnosis not present

## 2018-06-08 DIAGNOSIS — I700 Atherosclerosis of aorta: Secondary | ICD-10-CM | POA: Diagnosis not present

## 2018-06-08 DIAGNOSIS — Z23 Encounter for immunization: Secondary | ICD-10-CM | POA: Diagnosis not present

## 2018-06-08 DIAGNOSIS — E7800 Pure hypercholesterolemia, unspecified: Secondary | ICD-10-CM | POA: Diagnosis not present

## 2018-06-08 DIAGNOSIS — I10 Essential (primary) hypertension: Secondary | ICD-10-CM | POA: Diagnosis not present

## 2018-06-11 DIAGNOSIS — H5212 Myopia, left eye: Secondary | ICD-10-CM | POA: Diagnosis not present

## 2018-06-11 DIAGNOSIS — H25093 Other age-related incipient cataract, bilateral: Secondary | ICD-10-CM | POA: Diagnosis not present

## 2018-06-11 DIAGNOSIS — H40013 Open angle with borderline findings, low risk, bilateral: Secondary | ICD-10-CM | POA: Diagnosis not present

## 2018-06-11 DIAGNOSIS — H524 Presbyopia: Secondary | ICD-10-CM | POA: Diagnosis not present

## 2018-06-11 DIAGNOSIS — Z83511 Family history of glaucoma: Secondary | ICD-10-CM | POA: Diagnosis not present

## 2018-06-11 DIAGNOSIS — H52223 Regular astigmatism, bilateral: Secondary | ICD-10-CM | POA: Diagnosis not present

## 2018-06-18 DIAGNOSIS — H401131 Primary open-angle glaucoma, bilateral, mild stage: Secondary | ICD-10-CM | POA: Diagnosis not present

## 2018-07-12 DIAGNOSIS — H401131 Primary open-angle glaucoma, bilateral, mild stage: Secondary | ICD-10-CM | POA: Diagnosis not present

## 2018-08-12 DIAGNOSIS — R69 Illness, unspecified: Secondary | ICD-10-CM | POA: Diagnosis not present

## 2018-10-11 DIAGNOSIS — H401131 Primary open-angle glaucoma, bilateral, mild stage: Secondary | ICD-10-CM | POA: Diagnosis not present

## 2018-10-20 DIAGNOSIS — J209 Acute bronchitis, unspecified: Secondary | ICD-10-CM | POA: Diagnosis not present

## 2018-12-12 IMAGING — CT CT CHEST LUNG CANCER SCREENING LOW DOSE W/O CM
1 series · 10 of 10 positions shown, 13 images · non-contrast
Comparison: 11/20/2016.

CLINICAL DATA: Current smoker, 37 pack-year history, lung cancer
screening.

EXAM:
CT CHEST WITHOUT CONTRAST LOW-DOSE FOR LUNG CANCER SCREENING
TECHNIQUE: Multidetector CT imaging of the chest was performed following the
standard protocol without IV contrast.

[ct lung segmentation data · axial · 0.77mm/px · z∈[-324,-324]mm · 10 of 318 frames shown]
[frame 1/318  mediastinal]
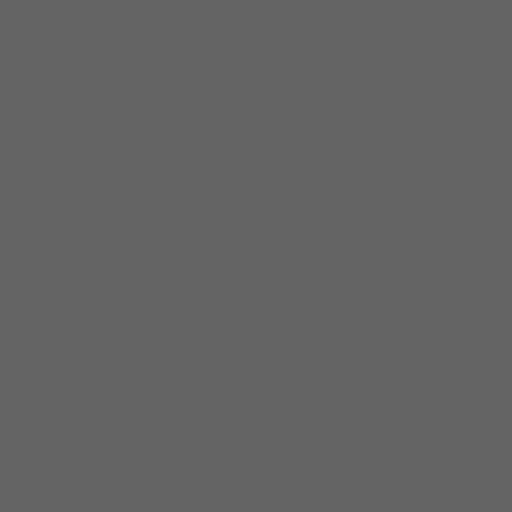
[frame 1/318  lung]
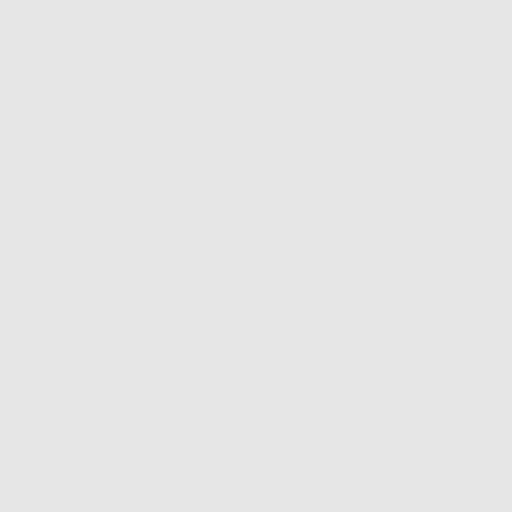
[frame 36/318  lung]
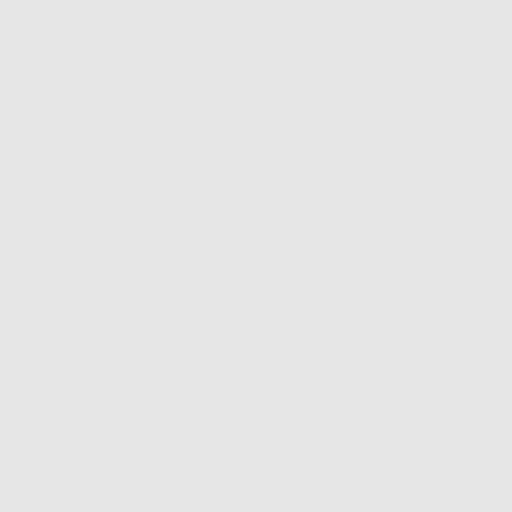
[frame 71/318  lung]
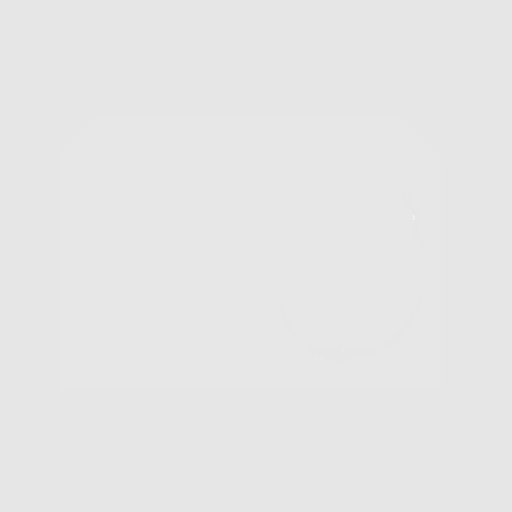
[frame 106/318  lung]
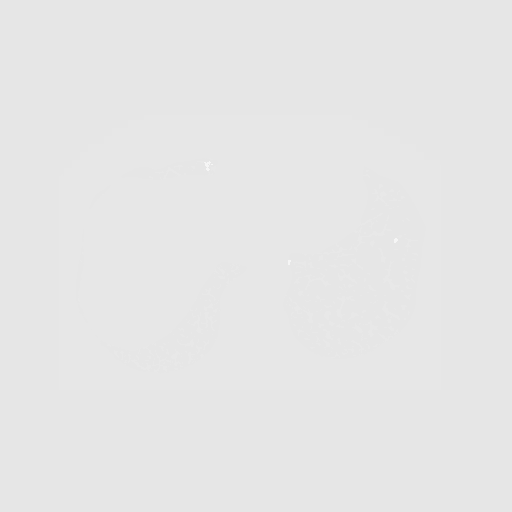
[frame 141/318  mediastinal]
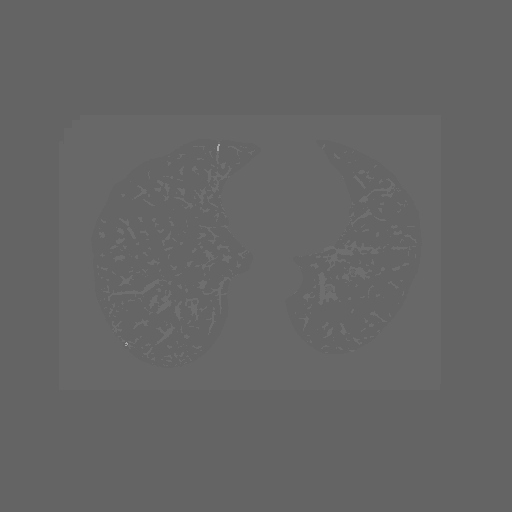
[frame 141/318  lung]
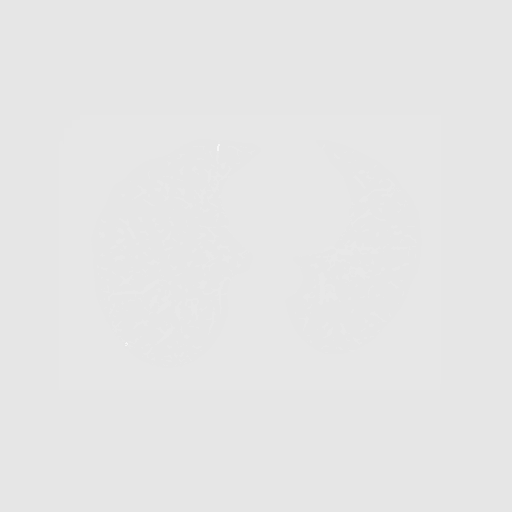
[frame 177/318  lung]
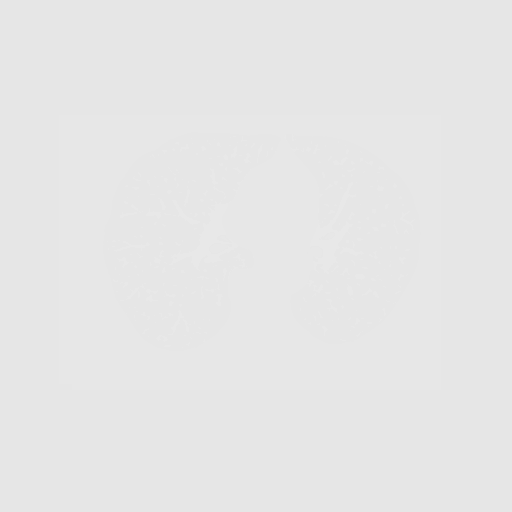
[frame 212/318  lung]
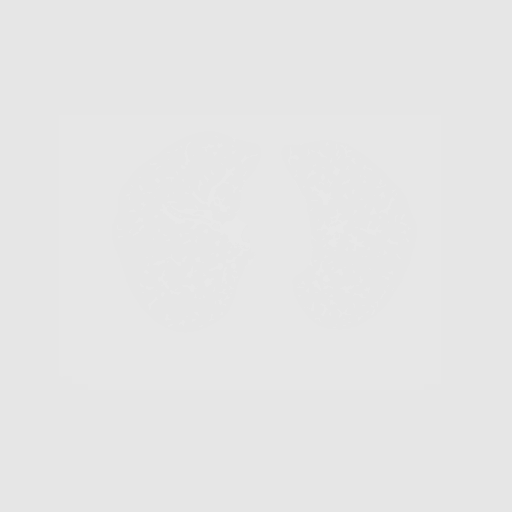
[frame 247/318  lung]
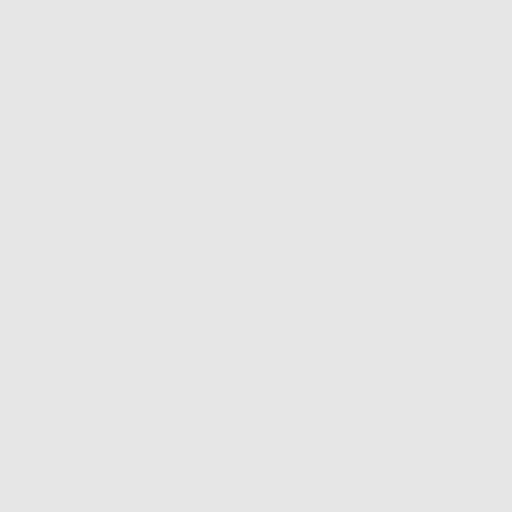
[frame 282/318  mediastinal]
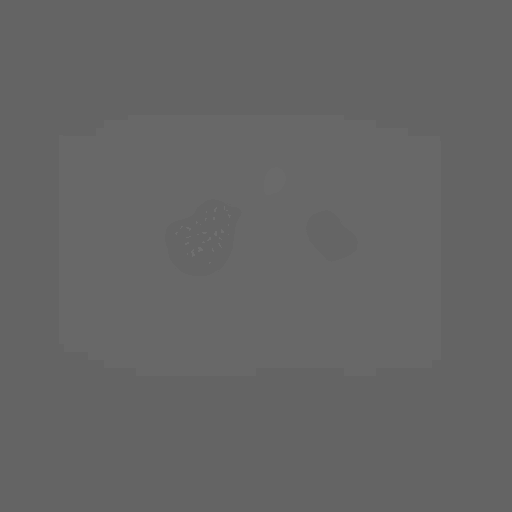
[frame 282/318  lung]
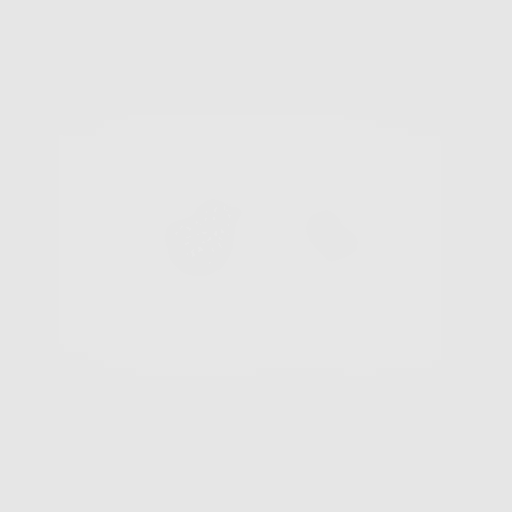
[frame 318/318  lung]
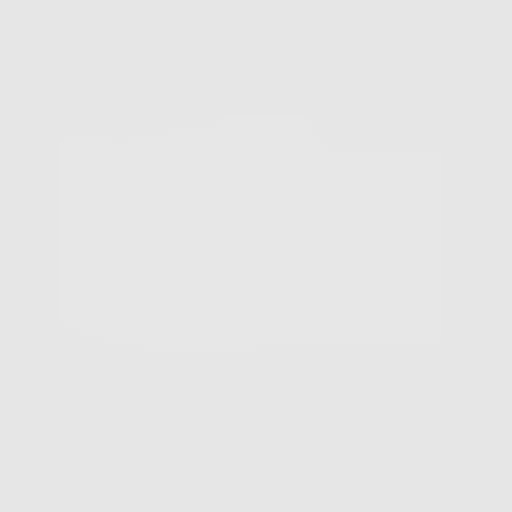

[10 of 10 positions shown; findings below may reference images not displayed]

FINDINGS: Cardiovascular: Atherosclerotic calcification of the arterial
vasculature, including coronary arteries. Heart size normal. No
pericardial effusion.

Mediastinum/Nodes: No pathologically enlarged mediastinal or
axillary lymph nodes. Hilar regions are difficult to evaluate
without IV contrast. Esophagus is grossly unremarkable.

Lungs/Pleura: Mild scattered scarring in the lung bases. 2 mm right
upper lobe pulmonary nodule, unchanged. No new pulmonary nodules. No
pleural fluid. Airway is unremarkable.

Upper Abdomen: Visualized portions of the liver, adrenal glands,
kidneys, spleen, pancreas, stomach and bowel are grossly
unremarkable. Cholecystectomy. No upper abdominal adenopathy.

Musculoskeletal: Degenerative changes in the spine. No worrisome
lytic or sclerotic lesions.
IMPRESSION: 1. Lung-RADS 2, benign appearance or behavior. Continue annual
screening with low-dose chest CT without contrast in 12 months.
2. Aortic atherosclerosis (937QQ-170.0). Coronary artery
calcification.

## 2018-12-29 ENCOUNTER — Other Ambulatory Visit: Payer: Self-pay | Admitting: Internal Medicine

## 2018-12-29 DIAGNOSIS — Z1231 Encounter for screening mammogram for malignant neoplasm of breast: Secondary | ICD-10-CM

## 2019-02-16 ENCOUNTER — Ambulatory Visit
Admission: RE | Admit: 2019-02-16 | Discharge: 2019-02-16 | Disposition: A | Discharge status: Home / Self Care | Payer: Medicare HMO | Source: Ambulatory Visit | Attending: Internal Medicine | Admitting: Internal Medicine

## 2019-02-16 ENCOUNTER — Other Ambulatory Visit: Payer: Self-pay

## 2019-02-16 DIAGNOSIS — Z1231 Encounter for screening mammogram for malignant neoplasm of breast: Secondary | ICD-10-CM

## 2019-02-18 DIAGNOSIS — Z20828 Contact with and (suspected) exposure to other viral communicable diseases: Secondary | ICD-10-CM | POA: Diagnosis not present

## 2019-02-24 ENCOUNTER — Ambulatory Visit: Payer: Medicare HMO

## 2019-05-04 DIAGNOSIS — K635 Polyp of colon: Secondary | ICD-10-CM | POA: Diagnosis not present

## 2019-05-04 DIAGNOSIS — Z980 Intestinal bypass and anastomosis status: Secondary | ICD-10-CM | POA: Diagnosis not present

## 2019-05-04 DIAGNOSIS — D123 Benign neoplasm of transverse colon: Secondary | ICD-10-CM | POA: Diagnosis not present

## 2019-05-04 DIAGNOSIS — D124 Benign neoplasm of descending colon: Secondary | ICD-10-CM | POA: Diagnosis not present

## 2019-05-04 DIAGNOSIS — K644 Residual hemorrhoidal skin tags: Secondary | ICD-10-CM | POA: Diagnosis not present

## 2019-05-04 DIAGNOSIS — D125 Benign neoplasm of sigmoid colon: Secondary | ICD-10-CM | POA: Diagnosis not present

## 2019-05-04 DIAGNOSIS — Z86010 Personal history of colonic polyps: Secondary | ICD-10-CM | POA: Diagnosis not present

## 2019-05-04 DIAGNOSIS — K5730 Diverticulosis of large intestine without perforation or abscess without bleeding: Secondary | ICD-10-CM | POA: Diagnosis not present

## 2019-05-06 DIAGNOSIS — D124 Benign neoplasm of descending colon: Secondary | ICD-10-CM | POA: Diagnosis not present

## 2019-05-06 DIAGNOSIS — D123 Benign neoplasm of transverse colon: Secondary | ICD-10-CM | POA: Diagnosis not present

## 2019-05-06 DIAGNOSIS — K635 Polyp of colon: Secondary | ICD-10-CM | POA: Diagnosis not present

## 2019-05-06 DIAGNOSIS — D125 Benign neoplasm of sigmoid colon: Secondary | ICD-10-CM | POA: Diagnosis not present

## 2019-06-28 ENCOUNTER — Other Ambulatory Visit: Payer: Self-pay | Admitting: Internal Medicine

## 2019-06-28 DIAGNOSIS — E7800 Pure hypercholesterolemia, unspecified: Secondary | ICD-10-CM | POA: Diagnosis not present

## 2019-06-28 DIAGNOSIS — I700 Atherosclerosis of aorta: Secondary | ICD-10-CM | POA: Diagnosis not present

## 2019-06-28 DIAGNOSIS — Z0000 Encounter for general adult medical examination without abnormal findings: Secondary | ICD-10-CM | POA: Diagnosis not present

## 2019-06-28 DIAGNOSIS — Z86010 Personal history of colonic polyps: Secondary | ICD-10-CM | POA: Diagnosis not present

## 2019-06-28 DIAGNOSIS — F17210 Nicotine dependence, cigarettes, uncomplicated: Secondary | ICD-10-CM

## 2019-06-28 DIAGNOSIS — I10 Essential (primary) hypertension: Secondary | ICD-10-CM | POA: Diagnosis not present

## 2019-06-28 DIAGNOSIS — Z23 Encounter for immunization: Secondary | ICD-10-CM | POA: Diagnosis not present

## 2019-06-28 DIAGNOSIS — R69 Illness, unspecified: Secondary | ICD-10-CM | POA: Diagnosis not present

## 2019-06-28 DIAGNOSIS — Z1389 Encounter for screening for other disorder: Secondary | ICD-10-CM | POA: Diagnosis not present

## 2019-06-29 DIAGNOSIS — Z0100 Encounter for examination of eyes and vision without abnormal findings: Secondary | ICD-10-CM | POA: Diagnosis not present

## 2019-06-30 ENCOUNTER — Other Ambulatory Visit: Payer: Self-pay | Admitting: Internal Medicine

## 2019-06-30 DIAGNOSIS — F17210 Nicotine dependence, cigarettes, uncomplicated: Secondary | ICD-10-CM

## 2019-07-04 ENCOUNTER — Ambulatory Visit: Payer: Medicare HMO

## 2019-07-05 ENCOUNTER — Ambulatory Visit
Admission: RE | Admit: 2019-07-05 | Discharge: 2019-07-05 | Disposition: A | Discharge status: Home / Self Care | Payer: Medicare HMO | Source: Ambulatory Visit | Attending: Internal Medicine | Admitting: Internal Medicine

## 2019-07-05 DIAGNOSIS — F17210 Nicotine dependence, cigarettes, uncomplicated: Secondary | ICD-10-CM

## 2019-07-05 DIAGNOSIS — Z87891 Personal history of nicotine dependence: Secondary | ICD-10-CM | POA: Diagnosis not present

## 2019-07-11 ENCOUNTER — Other Ambulatory Visit: Payer: Self-pay | Admitting: Internal Medicine

## 2019-07-11 ENCOUNTER — Ambulatory Visit
Admission: RE | Admit: 2019-07-11 | Discharge: 2019-07-11 | Disposition: A | Discharge status: Home / Self Care | Payer: Medicare HMO | Source: Ambulatory Visit | Attending: Internal Medicine | Admitting: Internal Medicine

## 2019-07-11 ENCOUNTER — Other Ambulatory Visit: Payer: Self-pay

## 2019-07-11 DIAGNOSIS — R1032 Left lower quadrant pain: Secondary | ICD-10-CM

## 2019-07-11 DIAGNOSIS — K5730 Diverticulosis of large intestine without perforation or abscess without bleeding: Secondary | ICD-10-CM | POA: Diagnosis not present

## 2019-07-11 MED ORDER — IOPAMIDOL (ISOVUE-300) INJECTION 61%
100.0000 mL | Freq: Once | INTRAVENOUS | Status: AC | PRN
  Administered 2019-07-11: 100 mL via INTRAVENOUS

## 2019-07-19 DIAGNOSIS — K630 Abscess of intestine: Secondary | ICD-10-CM | POA: Diagnosis not present

## 2019-07-19 DIAGNOSIS — K529 Noninfective gastroenteritis and colitis, unspecified: Secondary | ICD-10-CM | POA: Diagnosis not present

## 2019-08-04 ENCOUNTER — Other Ambulatory Visit: Payer: Self-pay | Admitting: Gastroenterology

## 2019-08-04 DIAGNOSIS — K5732 Diverticulitis of large intestine without perforation or abscess without bleeding: Secondary | ICD-10-CM | POA: Diagnosis not present

## 2019-08-04 DIAGNOSIS — N321 Vesicointestinal fistula: Secondary | ICD-10-CM | POA: Diagnosis not present

## 2019-08-04 DIAGNOSIS — K630 Abscess of intestine: Secondary | ICD-10-CM

## 2019-08-11 ENCOUNTER — Other Ambulatory Visit: Payer: Medicare HMO

## 2019-08-12 ENCOUNTER — Other Ambulatory Visit: Payer: Self-pay

## 2019-08-12 ENCOUNTER — Ambulatory Visit
Admission: RE | Admit: 2019-08-12 | Discharge: 2019-08-12 | Disposition: A | Discharge status: Home / Self Care | Payer: Medicare HMO | Source: Ambulatory Visit | Attending: Gastroenterology | Admitting: Gastroenterology

## 2019-08-12 DIAGNOSIS — K5732 Diverticulitis of large intestine without perforation or abscess without bleeding: Secondary | ICD-10-CM | POA: Diagnosis not present

## 2019-08-12 DIAGNOSIS — K630 Abscess of intestine: Secondary | ICD-10-CM

## 2019-08-12 MED ORDER — IOPAMIDOL (ISOVUE-300) INJECTION 61%
100.0000 mL | Freq: Once | INTRAVENOUS | Status: AC | PRN
  Administered 2019-08-12: 100 mL via INTRAVENOUS

## 2019-11-04 DIAGNOSIS — Z86010 Personal history of colonic polyps: Secondary | ICD-10-CM | POA: Diagnosis not present

## 2019-11-04 DIAGNOSIS — Z8719 Personal history of other diseases of the digestive system: Secondary | ICD-10-CM | POA: Diagnosis not present

## 2019-11-23 DIAGNOSIS — E7800 Pure hypercholesterolemia, unspecified: Secondary | ICD-10-CM | POA: Diagnosis not present

## 2019-11-23 DIAGNOSIS — I10 Essential (primary) hypertension: Secondary | ICD-10-CM | POA: Diagnosis not present

## 2019-11-23 DIAGNOSIS — E782 Mixed hyperlipidemia: Secondary | ICD-10-CM | POA: Diagnosis not present

## 2019-12-14 DIAGNOSIS — H401131 Primary open-angle glaucoma, bilateral, mild stage: Secondary | ICD-10-CM | POA: Diagnosis not present

## 2019-12-20 DIAGNOSIS — I10 Essential (primary) hypertension: Secondary | ICD-10-CM | POA: Diagnosis not present

## 2019-12-20 DIAGNOSIS — I700 Atherosclerosis of aorta: Secondary | ICD-10-CM | POA: Diagnosis not present

## 2019-12-20 DIAGNOSIS — E7800 Pure hypercholesterolemia, unspecified: Secondary | ICD-10-CM | POA: Diagnosis not present

## 2019-12-20 DIAGNOSIS — M48061 Spinal stenosis, lumbar region without neurogenic claudication: Secondary | ICD-10-CM | POA: Diagnosis not present

## 2019-12-20 DIAGNOSIS — R69 Illness, unspecified: Secondary | ICD-10-CM | POA: Diagnosis not present

## 2019-12-20 DIAGNOSIS — E663 Overweight: Secondary | ICD-10-CM | POA: Diagnosis not present

## 2020-01-09 ENCOUNTER — Other Ambulatory Visit: Payer: Self-pay | Admitting: Internal Medicine

## 2020-01-09 DIAGNOSIS — Z1231 Encounter for screening mammogram for malignant neoplasm of breast: Secondary | ICD-10-CM

## 2020-02-20 ENCOUNTER — Ambulatory Visit
Admission: RE | Admit: 2020-02-20 | Discharge: 2020-02-20 | Disposition: A | Discharge status: Home / Self Care | Payer: Medicare HMO | Source: Ambulatory Visit | Attending: Internal Medicine | Admitting: Internal Medicine

## 2020-02-20 ENCOUNTER — Other Ambulatory Visit: Payer: Self-pay

## 2020-02-20 DIAGNOSIS — Z1231 Encounter for screening mammogram for malignant neoplasm of breast: Secondary | ICD-10-CM

## 2020-03-10 DIAGNOSIS — Z0100 Encounter for examination of eyes and vision without abnormal findings: Secondary | ICD-10-CM | POA: Diagnosis not present

## 2020-03-10 DIAGNOSIS — R69 Illness, unspecified: Secondary | ICD-10-CM | POA: Diagnosis not present

## 2020-04-12 DIAGNOSIS — R69 Illness, unspecified: Secondary | ICD-10-CM | POA: Diagnosis not present

## 2020-05-24 ENCOUNTER — Other Ambulatory Visit: Payer: Self-pay | Admitting: Gastroenterology

## 2020-06-05 ENCOUNTER — Other Ambulatory Visit: Payer: Self-pay | Admitting: Gastroenterology

## 2020-06-05 DIAGNOSIS — N321 Vesicointestinal fistula: Secondary | ICD-10-CM

## 2020-06-05 DIAGNOSIS — R159 Full incontinence of feces: Secondary | ICD-10-CM

## 2020-06-11 ENCOUNTER — Other Ambulatory Visit: Payer: Self-pay | Admitting: Gastroenterology

## 2020-06-11 DIAGNOSIS — R159 Full incontinence of feces: Secondary | ICD-10-CM

## 2020-06-11 DIAGNOSIS — N321 Vesicointestinal fistula: Secondary | ICD-10-CM

## 2020-06-22 ENCOUNTER — Ambulatory Visit
Admission: RE | Admit: 2020-06-22 | Discharge: 2020-06-22 | Disposition: A | Discharge status: Home / Self Care | Payer: Medicare HMO | Source: Ambulatory Visit | Attending: Gastroenterology | Admitting: Gastroenterology

## 2020-06-22 ENCOUNTER — Other Ambulatory Visit: Payer: Self-pay

## 2020-06-22 DIAGNOSIS — K5732 Diverticulitis of large intestine without perforation or abscess without bleeding: Secondary | ICD-10-CM | POA: Diagnosis not present

## 2020-06-22 DIAGNOSIS — N321 Vesicointestinal fistula: Secondary | ICD-10-CM

## 2020-06-22 DIAGNOSIS — R159 Full incontinence of feces: Secondary | ICD-10-CM

## 2020-06-22 MED ORDER — IOPAMIDOL (ISOVUE-300) INJECTION 61%
100.0000 mL | Freq: Once | INTRAVENOUS | Status: AC | PRN
  Administered 2020-06-22: 100 mL via INTRAVENOUS

## 2020-06-24 DIAGNOSIS — R69 Illness, unspecified: Secondary | ICD-10-CM | POA: Diagnosis not present

## 2020-06-25 ENCOUNTER — Other Ambulatory Visit: Payer: Medicare HMO

## 2020-07-25 DIAGNOSIS — R69 Illness, unspecified: Secondary | ICD-10-CM | POA: Diagnosis not present

## 2020-07-25 DIAGNOSIS — M48061 Spinal stenosis, lumbar region without neurogenic claudication: Secondary | ICD-10-CM | POA: Diagnosis not present

## 2020-07-25 DIAGNOSIS — Z0000 Encounter for general adult medical examination without abnormal findings: Secondary | ICD-10-CM | POA: Diagnosis not present

## 2020-07-25 DIAGNOSIS — Z5181 Encounter for therapeutic drug level monitoring: Secondary | ICD-10-CM | POA: Diagnosis not present

## 2020-07-25 DIAGNOSIS — E7800 Pure hypercholesterolemia, unspecified: Secondary | ICD-10-CM | POA: Diagnosis not present

## 2020-07-25 DIAGNOSIS — I10 Essential (primary) hypertension: Secondary | ICD-10-CM | POA: Diagnosis not present

## 2020-07-25 DIAGNOSIS — E663 Overweight: Secondary | ICD-10-CM | POA: Diagnosis not present

## 2020-07-25 DIAGNOSIS — J432 Centrilobular emphysema: Secondary | ICD-10-CM | POA: Diagnosis not present

## 2020-07-25 DIAGNOSIS — I700 Atherosclerosis of aorta: Secondary | ICD-10-CM | POA: Diagnosis not present

## 2020-07-25 DIAGNOSIS — M6530 Trigger finger, unspecified finger: Secondary | ICD-10-CM | POA: Diagnosis not present

## 2020-08-01 DIAGNOSIS — M1812 Unilateral primary osteoarthritis of first carpometacarpal joint, left hand: Secondary | ICD-10-CM | POA: Diagnosis not present

## 2020-08-01 DIAGNOSIS — M65312 Trigger thumb, left thumb: Secondary | ICD-10-CM | POA: Diagnosis not present

## 2020-08-31 DIAGNOSIS — M6530 Trigger finger, unspecified finger: Secondary | ICD-10-CM | POA: Diagnosis not present

## 2020-08-31 DIAGNOSIS — M65351 Trigger finger, right little finger: Secondary | ICD-10-CM | POA: Diagnosis not present

## 2020-08-31 DIAGNOSIS — M65312 Trigger thumb, left thumb: Secondary | ICD-10-CM | POA: Diagnosis not present

## 2020-08-31 DIAGNOSIS — M19041 Primary osteoarthritis, right hand: Secondary | ICD-10-CM | POA: Diagnosis not present

## 2020-09-16 DIAGNOSIS — J432 Centrilobular emphysema: Secondary | ICD-10-CM | POA: Diagnosis not present

## 2020-09-16 DIAGNOSIS — E782 Mixed hyperlipidemia: Secondary | ICD-10-CM | POA: Diagnosis not present

## 2020-09-16 DIAGNOSIS — E7800 Pure hypercholesterolemia, unspecified: Secondary | ICD-10-CM | POA: Diagnosis not present

## 2020-09-16 DIAGNOSIS — I10 Essential (primary) hypertension: Secondary | ICD-10-CM | POA: Diagnosis not present

## 2020-10-12 DIAGNOSIS — M65351 Trigger finger, right little finger: Secondary | ICD-10-CM | POA: Diagnosis not present

## 2020-10-12 DIAGNOSIS — M65312 Trigger thumb, left thumb: Secondary | ICD-10-CM | POA: Diagnosis not present

## 2020-12-04 DIAGNOSIS — H401130 Primary open-angle glaucoma, bilateral, stage unspecified: Secondary | ICD-10-CM | POA: Diagnosis not present

## 2021-01-11 ENCOUNTER — Other Ambulatory Visit: Payer: Self-pay | Admitting: Internal Medicine

## 2021-01-11 DIAGNOSIS — Z1231 Encounter for screening mammogram for malignant neoplasm of breast: Secondary | ICD-10-CM

## 2021-01-24 DIAGNOSIS — J432 Centrilobular emphysema: Secondary | ICD-10-CM | POA: Diagnosis not present

## 2021-01-24 DIAGNOSIS — I10 Essential (primary) hypertension: Secondary | ICD-10-CM | POA: Diagnosis not present

## 2021-01-24 DIAGNOSIS — R69 Illness, unspecified: Secondary | ICD-10-CM | POA: Diagnosis not present

## 2021-01-24 DIAGNOSIS — E7800 Pure hypercholesterolemia, unspecified: Secondary | ICD-10-CM | POA: Diagnosis not present

## 2021-01-24 DIAGNOSIS — M5432 Sciatica, left side: Secondary | ICD-10-CM | POA: Diagnosis not present

## 2021-01-25 ENCOUNTER — Other Ambulatory Visit: Payer: Self-pay | Admitting: Internal Medicine

## 2021-01-25 DIAGNOSIS — F17210 Nicotine dependence, cigarettes, uncomplicated: Secondary | ICD-10-CM

## 2021-02-08 ENCOUNTER — Other Ambulatory Visit: Payer: Self-pay

## 2021-02-08 ENCOUNTER — Ambulatory Visit
Admission: RE | Admit: 2021-02-08 | Discharge: 2021-02-08 | Disposition: A | Discharge status: Home / Self Care | Payer: Medicare HMO | Source: Ambulatory Visit | Attending: Internal Medicine | Admitting: Internal Medicine

## 2021-02-08 DIAGNOSIS — R69 Illness, unspecified: Secondary | ICD-10-CM | POA: Diagnosis not present

## 2021-02-08 DIAGNOSIS — F17210 Nicotine dependence, cigarettes, uncomplicated: Secondary | ICD-10-CM

## 2021-02-19 DIAGNOSIS — Z23 Encounter for immunization: Secondary | ICD-10-CM | POA: Diagnosis not present

## 2021-02-26 ENCOUNTER — Other Ambulatory Visit: Payer: Self-pay | Admitting: Gastroenterology

## 2021-02-26 DIAGNOSIS — K5732 Diverticulitis of large intestine without perforation or abscess without bleeding: Secondary | ICD-10-CM

## 2021-02-28 ENCOUNTER — Ambulatory Visit
Admission: RE | Admit: 2021-02-28 | Discharge: 2021-02-28 | Disposition: A | Discharge status: Home / Self Care | Payer: Medicare HMO | Source: Ambulatory Visit | Attending: Gastroenterology | Admitting: Gastroenterology

## 2021-02-28 ENCOUNTER — Other Ambulatory Visit: Payer: Self-pay

## 2021-02-28 DIAGNOSIS — I700 Atherosclerosis of aorta: Secondary | ICD-10-CM | POA: Diagnosis not present

## 2021-02-28 DIAGNOSIS — K6389 Other specified diseases of intestine: Secondary | ICD-10-CM | POA: Diagnosis not present

## 2021-02-28 DIAGNOSIS — D259 Leiomyoma of uterus, unspecified: Secondary | ICD-10-CM | POA: Diagnosis not present

## 2021-02-28 DIAGNOSIS — K5732 Diverticulitis of large intestine without perforation or abscess without bleeding: Secondary | ICD-10-CM | POA: Diagnosis not present

## 2021-02-28 MED ORDER — IOPAMIDOL (ISOVUE-300) INJECTION 61%
100.0000 mL | Freq: Once | INTRAVENOUS | Status: AC | PRN
  Administered 2021-02-28: 100 mL via INTRAVENOUS

## 2021-03-05 ENCOUNTER — Ambulatory Visit: Payer: Medicare HMO

## 2021-04-26 ENCOUNTER — Other Ambulatory Visit: Payer: Self-pay

## 2021-04-26 ENCOUNTER — Ambulatory Visit
Admission: RE | Admit: 2021-04-26 | Discharge: 2021-04-26 | Disposition: A | Discharge status: Home / Self Care | Payer: Medicare HMO | Source: Ambulatory Visit | Attending: Internal Medicine | Admitting: Internal Medicine

## 2021-04-26 DIAGNOSIS — Z1231 Encounter for screening mammogram for malignant neoplasm of breast: Secondary | ICD-10-CM | POA: Diagnosis not present

## 2021-06-10 DIAGNOSIS — H52229 Regular astigmatism, unspecified eye: Secondary | ICD-10-CM | POA: Diagnosis not present

## 2021-06-10 DIAGNOSIS — H40009 Preglaucoma, unspecified, unspecified eye: Secondary | ICD-10-CM | POA: Diagnosis not present

## 2021-06-10 DIAGNOSIS — Z0100 Encounter for examination of eyes and vision without abnormal findings: Secondary | ICD-10-CM | POA: Diagnosis not present

## 2021-07-16 DIAGNOSIS — Z23 Encounter for immunization: Secondary | ICD-10-CM | POA: Diagnosis not present

## 2021-08-06 DIAGNOSIS — I10 Essential (primary) hypertension: Secondary | ICD-10-CM | POA: Diagnosis not present

## 2021-08-06 DIAGNOSIS — E663 Overweight: Secondary | ICD-10-CM | POA: Diagnosis not present

## 2021-08-06 DIAGNOSIS — I700 Atherosclerosis of aorta: Secondary | ICD-10-CM | POA: Diagnosis not present

## 2021-08-06 DIAGNOSIS — Z0000 Encounter for general adult medical examination without abnormal findings: Secondary | ICD-10-CM | POA: Diagnosis not present

## 2021-08-06 DIAGNOSIS — E2839 Other primary ovarian failure: Secondary | ICD-10-CM | POA: Diagnosis not present

## 2021-08-06 DIAGNOSIS — M25539 Pain in unspecified wrist: Secondary | ICD-10-CM | POA: Diagnosis not present

## 2021-08-06 DIAGNOSIS — J432 Centrilobular emphysema: Secondary | ICD-10-CM | POA: Diagnosis not present

## 2021-08-06 DIAGNOSIS — E7800 Pure hypercholesterolemia, unspecified: Secondary | ICD-10-CM | POA: Diagnosis not present

## 2021-08-06 DIAGNOSIS — R69 Illness, unspecified: Secondary | ICD-10-CM | POA: Diagnosis not present

## 2021-08-14 DIAGNOSIS — M1811 Unilateral primary osteoarthritis of first carpometacarpal joint, right hand: Secondary | ICD-10-CM | POA: Diagnosis not present

## 2021-08-14 DIAGNOSIS — R159 Full incontinence of feces: Secondary | ICD-10-CM | POA: Insufficient documentation

## 2021-08-14 DIAGNOSIS — J432 Centrilobular emphysema: Secondary | ICD-10-CM | POA: Insufficient documentation

## 2021-08-14 DIAGNOSIS — F419 Anxiety disorder, unspecified: Secondary | ICD-10-CM | POA: Insufficient documentation

## 2021-08-14 DIAGNOSIS — R253 Fasciculation: Secondary | ICD-10-CM | POA: Insufficient documentation

## 2021-08-14 DIAGNOSIS — R259 Unspecified abnormal involuntary movements: Secondary | ICD-10-CM | POA: Insufficient documentation

## 2021-08-14 DIAGNOSIS — M79641 Pain in right hand: Secondary | ICD-10-CM | POA: Diagnosis not present

## 2021-08-14 DIAGNOSIS — E2839 Other primary ovarian failure: Secondary | ICD-10-CM | POA: Insufficient documentation

## 2021-08-14 DIAGNOSIS — N321 Vesicointestinal fistula: Secondary | ICD-10-CM | POA: Insufficient documentation

## 2021-08-16 DIAGNOSIS — M79641 Pain in right hand: Secondary | ICD-10-CM | POA: Diagnosis not present

## 2021-10-09 DIAGNOSIS — M65351 Trigger finger, right little finger: Secondary | ICD-10-CM | POA: Diagnosis not present

## 2021-10-09 DIAGNOSIS — M1811 Unilateral primary osteoarthritis of first carpometacarpal joint, right hand: Secondary | ICD-10-CM | POA: Diagnosis not present

## 2021-12-04 DIAGNOSIS — M180 Bilateral primary osteoarthritis of first carpometacarpal joints: Secondary | ICD-10-CM | POA: Diagnosis not present

## 2022-01-16 DIAGNOSIS — Z0100 Encounter for examination of eyes and vision without abnormal findings: Secondary | ICD-10-CM | POA: Diagnosis not present

## 2022-02-04 DIAGNOSIS — I10 Essential (primary) hypertension: Secondary | ICD-10-CM | POA: Diagnosis not present

## 2022-02-04 DIAGNOSIS — R69 Illness, unspecified: Secondary | ICD-10-CM | POA: Diagnosis not present

## 2022-02-04 DIAGNOSIS — E2839 Other primary ovarian failure: Secondary | ICD-10-CM | POA: Diagnosis not present

## 2022-02-04 DIAGNOSIS — E7800 Pure hypercholesterolemia, unspecified: Secondary | ICD-10-CM | POA: Diagnosis not present

## 2022-02-17 ENCOUNTER — Other Ambulatory Visit: Payer: Self-pay | Admitting: Internal Medicine

## 2022-02-17 DIAGNOSIS — E2839 Other primary ovarian failure: Secondary | ICD-10-CM

## 2022-03-18 ENCOUNTER — Other Ambulatory Visit: Payer: Self-pay | Admitting: Internal Medicine

## 2022-03-18 ENCOUNTER — Ambulatory Visit
Admission: RE | Admit: 2022-03-18 | Discharge: 2022-03-18 | Disposition: A | Discharge status: Home / Self Care | Payer: Medicare HMO | Source: Ambulatory Visit | Attending: Internal Medicine | Admitting: Internal Medicine

## 2022-03-18 DIAGNOSIS — G459 Transient cerebral ischemic attack, unspecified: Secondary | ICD-10-CM

## 2022-03-18 DIAGNOSIS — R29898 Other symptoms and signs involving the musculoskeletal system: Secondary | ICD-10-CM | POA: Diagnosis not present

## 2022-03-18 DIAGNOSIS — R109 Unspecified abdominal pain: Secondary | ICD-10-CM | POA: Diagnosis not present

## 2022-03-18 DIAGNOSIS — R531 Weakness: Secondary | ICD-10-CM | POA: Diagnosis not present

## 2022-03-19 ENCOUNTER — Other Ambulatory Visit: Payer: Self-pay | Admitting: Internal Medicine

## 2022-03-19 DIAGNOSIS — Z1231 Encounter for screening mammogram for malignant neoplasm of breast: Secondary | ICD-10-CM

## 2022-03-28 DIAGNOSIS — G459 Transient cerebral ischemic attack, unspecified: Secondary | ICD-10-CM | POA: Diagnosis not present

## 2022-03-28 DIAGNOSIS — I6523 Occlusion and stenosis of bilateral carotid arteries: Secondary | ICD-10-CM | POA: Diagnosis not present

## 2022-04-05 ENCOUNTER — Other Ambulatory Visit: Payer: Self-pay | Admitting: Cardiology

## 2022-04-08 LAB — LIPID PANEL W/O CHOL/HDL RATIO
Cholesterol, Total: 187 mg/dL (ref 100–199)
HDL: 69 mg/dL (ref >39)
LDL Chol Calc (NIH): 94 mg/dL (ref 0–99)
Triglycerides: 141 mg/dL (ref 0–149)
VLDL Cholesterol Cal: 24 mg/dL (ref 5–40)

## 2022-04-18 DIAGNOSIS — R140 Abdominal distension (gaseous): Secondary | ICD-10-CM | POA: Diagnosis not present

## 2022-04-18 DIAGNOSIS — K5792 Diverticulitis of intestine, part unspecified, without perforation or abscess without bleeding: Secondary | ICD-10-CM | POA: Diagnosis not present

## 2022-04-18 DIAGNOSIS — Z86010 Personal history of colonic polyps: Secondary | ICD-10-CM | POA: Diagnosis not present

## 2022-04-22 ENCOUNTER — Telehealth: Payer: Self-pay

## 2022-04-22 NOTE — Telephone Encounter (Signed)
Called patient via to give lab results from free screening.  Total Cholesterol: 187 Triglycerides: 141 HDL Cholesterol: 69 LDL Cholesterol: 94   Patient voiced understanding of normal lab results.

## 2022-04-28 ENCOUNTER — Ambulatory Visit
Admission: RE | Admit: 2022-04-28 | Discharge: 2022-04-28 | Disposition: A | Discharge status: Home / Self Care | Payer: Medicare Other | Source: Ambulatory Visit | Attending: Internal Medicine | Admitting: Internal Medicine

## 2022-04-28 DIAGNOSIS — Z1231 Encounter for screening mammogram for malignant neoplasm of breast: Secondary | ICD-10-CM | POA: Diagnosis not present

## 2022-06-18 DIAGNOSIS — H524 Presbyopia: Secondary | ICD-10-CM | POA: Diagnosis not present

## 2022-06-20 DIAGNOSIS — H524 Presbyopia: Secondary | ICD-10-CM | POA: Diagnosis not present

## 2022-07-08 DIAGNOSIS — K5289 Other specified noninfective gastroenteritis and colitis: Secondary | ICD-10-CM | POA: Diagnosis not present

## 2022-07-08 DIAGNOSIS — K648 Other hemorrhoids: Secondary | ICD-10-CM | POA: Diagnosis not present

## 2022-07-08 DIAGNOSIS — Z86010 Personal history of colonic polyps: Secondary | ICD-10-CM | POA: Diagnosis not present

## 2022-07-08 DIAGNOSIS — K5730 Diverticulosis of large intestine without perforation or abscess without bleeding: Secondary | ICD-10-CM | POA: Diagnosis not present

## 2022-07-08 DIAGNOSIS — Z09 Encounter for follow-up examination after completed treatment for conditions other than malignant neoplasm: Secondary | ICD-10-CM | POA: Diagnosis not present

## 2022-07-08 DIAGNOSIS — K529 Noninfective gastroenteritis and colitis, unspecified: Secondary | ICD-10-CM | POA: Diagnosis not present

## 2022-07-08 DIAGNOSIS — Z980 Intestinal bypass and anastomosis status: Secondary | ICD-10-CM | POA: Diagnosis not present

## 2022-07-08 DIAGNOSIS — D123 Benign neoplasm of transverse colon: Secondary | ICD-10-CM | POA: Diagnosis not present

## 2022-07-08 DIAGNOSIS — K6389 Other specified diseases of intestine: Secondary | ICD-10-CM | POA: Diagnosis not present

## 2022-07-10 DIAGNOSIS — D123 Benign neoplasm of transverse colon: Secondary | ICD-10-CM | POA: Diagnosis not present

## 2022-07-10 DIAGNOSIS — K5289 Other specified noninfective gastroenteritis and colitis: Secondary | ICD-10-CM | POA: Diagnosis not present

## 2022-08-08 ENCOUNTER — Ambulatory Visit
Admission: RE | Admit: 2022-08-08 | Discharge: 2022-08-08 | Disposition: A | Discharge status: Home / Self Care | Payer: Medicare Other | Source: Ambulatory Visit | Attending: Internal Medicine | Admitting: Internal Medicine

## 2022-08-08 DIAGNOSIS — Z780 Asymptomatic menopausal state: Secondary | ICD-10-CM | POA: Diagnosis not present

## 2022-08-08 DIAGNOSIS — E2839 Other primary ovarian failure: Secondary | ICD-10-CM

## 2022-08-12 ENCOUNTER — Encounter: Payer: Self-pay | Admitting: *Deleted

## 2022-08-12 NOTE — Progress Notes (Signed)
Unable to obtain current phone number. Pt [employer] claims data indicates pt saw PCP Dr. Delfina Redwood, with [facility], 6/30/23[facility] CM contacted to determine if pt has access to (still sees) PCP, Dr. Delfina Redwood. Janalyn Shy, [facility] CM RN, confirmed pt still being followed by Dr. Delfina Redwood as her PCP and that Dr. Delfina Redwood is aware of pt's follow up by oncology and GI as documented in [facility] records. Pt seen by Dr. Delfina Redwood as noted on 03/28/22. No additional 5-in-5 health equity team follow up needs noted so no additional follow-up no call or letter to pt indicated at this time per [facility].

## 2022-09-02 DIAGNOSIS — E7800 Pure hypercholesterolemia, unspecified: Secondary | ICD-10-CM | POA: Diagnosis not present

## 2022-09-02 DIAGNOSIS — J432 Centrilobular emphysema: Secondary | ICD-10-CM | POA: Diagnosis not present

## 2022-09-02 DIAGNOSIS — R69 Illness, unspecified: Secondary | ICD-10-CM | POA: Diagnosis not present

## 2022-09-02 DIAGNOSIS — R001 Bradycardia, unspecified: Secondary | ICD-10-CM | POA: Diagnosis not present

## 2022-09-02 DIAGNOSIS — Z0000 Encounter for general adult medical examination without abnormal findings: Secondary | ICD-10-CM | POA: Diagnosis not present

## 2022-09-02 DIAGNOSIS — M48061 Spinal stenosis, lumbar region without neurogenic claudication: Secondary | ICD-10-CM | POA: Diagnosis not present

## 2022-09-02 DIAGNOSIS — I10 Essential (primary) hypertension: Secondary | ICD-10-CM | POA: Diagnosis not present

## 2022-09-02 DIAGNOSIS — I700 Atherosclerosis of aorta: Secondary | ICD-10-CM | POA: Diagnosis not present

## 2022-09-24 DIAGNOSIS — R001 Bradycardia, unspecified: Secondary | ICD-10-CM | POA: Diagnosis not present

## 2022-09-24 DIAGNOSIS — U071 COVID-19: Secondary | ICD-10-CM | POA: Diagnosis not present

## 2022-09-24 DIAGNOSIS — R6883 Chills (without fever): Secondary | ICD-10-CM | POA: Diagnosis not present

## 2022-09-24 DIAGNOSIS — R519 Headache, unspecified: Secondary | ICD-10-CM | POA: Diagnosis not present

## 2022-10-07 DIAGNOSIS — R001 Bradycardia, unspecified: Secondary | ICD-10-CM | POA: Diagnosis not present

## 2022-12-30 DIAGNOSIS — R001 Bradycardia, unspecified: Secondary | ICD-10-CM | POA: Diagnosis not present

## 2023-03-10 ENCOUNTER — Other Ambulatory Visit: Payer: Self-pay | Admitting: Internal Medicine

## 2023-03-10 DIAGNOSIS — F17210 Nicotine dependence, cigarettes, uncomplicated: Secondary | ICD-10-CM

## 2023-03-10 DIAGNOSIS — I700 Atherosclerosis of aorta: Secondary | ICD-10-CM | POA: Diagnosis not present

## 2023-03-10 DIAGNOSIS — I10 Essential (primary) hypertension: Secondary | ICD-10-CM | POA: Diagnosis not present

## 2023-03-10 DIAGNOSIS — E7800 Pure hypercholesterolemia, unspecified: Secondary | ICD-10-CM | POA: Diagnosis not present

## 2023-03-10 DIAGNOSIS — J432 Centrilobular emphysema: Secondary | ICD-10-CM | POA: Diagnosis not present

## 2023-03-17 ENCOUNTER — Encounter: Payer: Self-pay | Admitting: Internal Medicine

## 2023-03-20 ENCOUNTER — Other Ambulatory Visit: Payer: Self-pay | Admitting: Internal Medicine

## 2023-03-20 DIAGNOSIS — Z1231 Encounter for screening mammogram for malignant neoplasm of breast: Secondary | ICD-10-CM

## 2023-03-27 ENCOUNTER — Ambulatory Visit
Admission: RE | Admit: 2023-03-27 | Discharge: 2023-03-27 | Disposition: A | Discharge status: Home / Self Care | Payer: Medicare Other | Source: Ambulatory Visit | Attending: Internal Medicine | Admitting: Internal Medicine

## 2023-03-27 DIAGNOSIS — F17210 Nicotine dependence, cigarettes, uncomplicated: Secondary | ICD-10-CM

## 2023-04-30 ENCOUNTER — Ambulatory Visit
Admission: RE | Admit: 2023-04-30 | Discharge: 2023-04-30 | Disposition: A | Discharge status: Home / Self Care | Payer: Medicare Other | Source: Ambulatory Visit | Attending: Internal Medicine | Admitting: Internal Medicine

## 2023-04-30 DIAGNOSIS — Z1231 Encounter for screening mammogram for malignant neoplasm of breast: Secondary | ICD-10-CM

## 2023-06-22 DIAGNOSIS — H2513 Age-related nuclear cataract, bilateral: Secondary | ICD-10-CM | POA: Diagnosis not present

## 2023-06-22 DIAGNOSIS — H52223 Regular astigmatism, bilateral: Secondary | ICD-10-CM | POA: Diagnosis not present

## 2023-06-22 DIAGNOSIS — H524 Presbyopia: Secondary | ICD-10-CM | POA: Diagnosis not present

## 2023-06-22 DIAGNOSIS — H401132 Primary open-angle glaucoma, bilateral, moderate stage: Secondary | ICD-10-CM | POA: Diagnosis not present

## 2023-06-22 DIAGNOSIS — H5203 Hypermetropia, bilateral: Secondary | ICD-10-CM | POA: Diagnosis not present

## 2023-08-02 NOTE — Progress Notes (Signed)
Pt states she will speak with PCP at next visit about low heart rate

## 2023-09-02 ENCOUNTER — Encounter: Payer: Self-pay | Admitting: *Deleted

## 2023-09-02 NOTE — Progress Notes (Signed)
Pt attended 08/02/23 screening event where her b/p was 137/68. At the event, the pt confirmed her PCP is Dr. Renford Dills at [facility] [place], also that she had insurance and she did not identify any SDOH insecurities. Chart review reveals via claims data that pt last saw Dr. Nehemiah Settle for an office visit on 03/10/23 and that she has had ongoing communication with his office for med refill and hypnotherapist referral. Future appt for Dr. Cristela Felt office are not visible in [facility] to confirm future appt dates. No additional health equity team support indicated at this time.

## 2023-09-14 ENCOUNTER — Other Ambulatory Visit: Payer: Self-pay

## 2023-09-14 ENCOUNTER — Inpatient Hospital Stay (HOSPITAL_BASED_OUTPATIENT_CLINIC_OR_DEPARTMENT_OTHER)
Admission: EM | Admit: 2023-09-14 | Discharge: 2023-09-19 | DRG: 442 | Disposition: A | Discharge status: Home Health | Payer: Medicare HMO | Attending: Internal Medicine | Admitting: Internal Medicine

## 2023-09-14 ENCOUNTER — Emergency Department (HOSPITAL_BASED_OUTPATIENT_CLINIC_OR_DEPARTMENT_OTHER): Payer: Medicare HMO

## 2023-09-14 DIAGNOSIS — R9389 Abnormal findings on diagnostic imaging of other specified body structures: Secondary | ICD-10-CM | POA: Diagnosis not present

## 2023-09-14 DIAGNOSIS — R531 Weakness: Secondary | ICD-10-CM | POA: Diagnosis not present

## 2023-09-14 DIAGNOSIS — J9811 Atelectasis: Secondary | ICD-10-CM | POA: Diagnosis present

## 2023-09-14 DIAGNOSIS — M5410 Radiculopathy, site unspecified: Secondary | ICD-10-CM | POA: Diagnosis present

## 2023-09-14 DIAGNOSIS — N281 Cyst of kidney, acquired: Secondary | ICD-10-CM | POA: Diagnosis not present

## 2023-09-14 DIAGNOSIS — Z79899 Other long term (current) drug therapy: Secondary | ICD-10-CM

## 2023-09-14 DIAGNOSIS — I959 Hypotension, unspecified: Secondary | ICD-10-CM | POA: Diagnosis not present

## 2023-09-14 DIAGNOSIS — Z860100 Personal history of colon polyps, unspecified: Secondary | ICD-10-CM

## 2023-09-14 DIAGNOSIS — Z91048 Other nonmedicinal substance allergy status: Secondary | ICD-10-CM

## 2023-09-14 DIAGNOSIS — K5730 Diverticulosis of large intestine without perforation or abscess without bleeding: Secondary | ICD-10-CM | POA: Diagnosis not present

## 2023-09-14 DIAGNOSIS — D72829 Elevated white blood cell count, unspecified: Secondary | ICD-10-CM | POA: Diagnosis present

## 2023-09-14 DIAGNOSIS — R109 Unspecified abdominal pain: Secondary | ICD-10-CM | POA: Diagnosis not present

## 2023-09-14 DIAGNOSIS — I10 Essential (primary) hypertension: Secondary | ICD-10-CM | POA: Diagnosis present

## 2023-09-14 DIAGNOSIS — K750 Abscess of liver: Principal | ICD-10-CM | POA: Diagnosis present

## 2023-09-14 DIAGNOSIS — R7881 Bacteremia: Secondary | ICD-10-CM | POA: Diagnosis present

## 2023-09-14 DIAGNOSIS — K429 Umbilical hernia without obstruction or gangrene: Secondary | ICD-10-CM | POA: Diagnosis not present

## 2023-09-14 DIAGNOSIS — F17210 Nicotine dependence, cigarettes, uncomplicated: Secondary | ICD-10-CM | POA: Diagnosis present

## 2023-09-14 DIAGNOSIS — K5792 Diverticulitis of intestine, part unspecified, without perforation or abscess without bleeding: Secondary | ICD-10-CM | POA: Diagnosis present

## 2023-09-14 DIAGNOSIS — J432 Centrilobular emphysema: Secondary | ICD-10-CM | POA: Diagnosis present

## 2023-09-14 DIAGNOSIS — K5732 Diverticulitis of large intestine without perforation or abscess without bleeding: Secondary | ICD-10-CM | POA: Diagnosis present

## 2023-09-14 DIAGNOSIS — Z8673 Personal history of transient ischemic attack (TIA), and cerebral infarction without residual deficits: Secondary | ICD-10-CM

## 2023-09-14 DIAGNOSIS — B955 Unspecified streptococcus as the cause of diseases classified elsewhere: Secondary | ICD-10-CM

## 2023-09-14 DIAGNOSIS — E871 Hypo-osmolality and hyponatremia: Secondary | ICD-10-CM | POA: Diagnosis present

## 2023-09-14 DIAGNOSIS — Z1152 Encounter for screening for COVID-19: Secondary | ICD-10-CM

## 2023-09-14 DIAGNOSIS — E785 Hyperlipidemia, unspecified: Secondary | ICD-10-CM | POA: Diagnosis present

## 2023-09-14 DIAGNOSIS — Z7982 Long term (current) use of aspirin: Secondary | ICD-10-CM

## 2023-09-14 DIAGNOSIS — M48061 Spinal stenosis, lumbar region without neurogenic claudication: Secondary | ICD-10-CM | POA: Diagnosis present

## 2023-09-14 DIAGNOSIS — K7689 Other specified diseases of liver: Secondary | ICD-10-CM | POA: Diagnosis not present

## 2023-09-14 DIAGNOSIS — E876 Hypokalemia: Secondary | ICD-10-CM | POA: Diagnosis not present

## 2023-09-14 DIAGNOSIS — Z9049 Acquired absence of other specified parts of digestive tract: Secondary | ICD-10-CM

## 2023-09-14 HISTORY — DX: Polyp of colon: K63.5

## 2023-09-14 HISTORY — DX: Diverticulitis of intestine, part unspecified, without perforation or abscess without bleeding: K57.92

## 2023-09-14 HISTORY — DX: Vesicointestinal fistula: N32.1

## 2023-09-14 HISTORY — DX: Centrilobular emphysema: J43.2

## 2023-09-14 HISTORY — DX: Transient cerebral ischemic attack, unspecified: G45.9

## 2023-09-14 HISTORY — DX: Hyperlipidemia, unspecified: E78.5

## 2023-09-14 LAB — CBC
HCT: 36.8 % (ref 36.0–46.0)
Hemoglobin: 12.7 g/dL (ref 12.0–15.0)
MCH: 29.7 pg (ref 26.0–34.0)
MCHC: 34.5 g/dL (ref 30.0–36.0)
MCV: 86.2 fL (ref 80.0–100.0)
Platelets: 201 10*3/uL (ref 150–400)
RBC: 4.27 MIL/uL (ref 3.87–5.11)
RDW: 13.7 % (ref 11.5–15.5)
WBC: 14.6 10*3/uL — ABNORMAL HIGH (ref 4.0–10.5)
nRBC: 0 % (ref 0.0–0.2)

## 2023-09-14 LAB — URINALYSIS, ROUTINE W REFLEX MICROSCOPIC
Bilirubin Urine: NEGATIVE
Glucose, UA: NEGATIVE mg/dL
Ketones, ur: NEGATIVE mg/dL
Leukocytes,Ua: NEGATIVE
Nitrite: NEGATIVE
Protein, ur: 100 mg/dL — AB
Specific Gravity, Urine: 1.025 (ref 1.005–1.030)
pH: 6 (ref 5.0–8.0)

## 2023-09-14 LAB — OCCULT BLOOD X 1 CARD TO LAB, STOOL: Fecal Occult Bld: NEGATIVE

## 2023-09-14 LAB — COMPREHENSIVE METABOLIC PANEL
ALT: 206 U/L — ABNORMAL HIGH (ref 0–44)
AST: 224 U/L — ABNORMAL HIGH (ref 15–41)
Albumin: 3.7 g/dL (ref 3.5–5.0)
Alkaline Phosphatase: 117 U/L (ref 38–126)
Anion gap: 13 (ref 5–15)
BUN: 40 mg/dL — ABNORMAL HIGH (ref 8–23)
CO2: 22 mmol/L (ref 22–32)
Calcium: 9.5 mg/dL (ref 8.9–10.3)
Chloride: 96 mmol/L — ABNORMAL LOW (ref 98–111)
Creatinine, Ser: 1.15 mg/dL — ABNORMAL HIGH (ref 0.44–1.00)
GFR, Estimated: 51 mL/min — ABNORMAL LOW (ref >60)
Glucose, Bld: 158 mg/dL — ABNORMAL HIGH (ref 70–99)
Potassium: 3.5 mmol/L (ref 3.5–5.1)
Sodium: 131 mmol/L — ABNORMAL LOW (ref 135–145)
Total Bilirubin: 1.3 mg/dL — ABNORMAL HIGH (ref <1.2)
Total Protein: 7.7 g/dL (ref 6.5–8.1)

## 2023-09-14 LAB — RESP PANEL BY RT-PCR (RSV, FLU A&B, COVID)  RVPGX2
Influenza A by PCR: NEGATIVE
Influenza B by PCR: NEGATIVE
Resp Syncytial Virus by PCR: NEGATIVE
SARS Coronavirus 2 by RT PCR: NEGATIVE

## 2023-09-14 LAB — LIPASE, BLOOD: Lipase: 24 U/L (ref 11–51)

## 2023-09-14 LAB — HEPATITIS PANEL, ACUTE
HCV Ab: NONREACTIVE
Hep A IgM: NONREACTIVE
Hep B C IgM: NONREACTIVE
Hepatitis B Surface Ag: NONREACTIVE

## 2023-09-14 MED ORDER — IOHEXOL 300 MG/ML  SOLN
100.0000 mL | Freq: Once | INTRAMUSCULAR | Status: AC | PRN
  Administered 2023-09-14: 85 mL via INTRAVENOUS

## 2023-09-14 MED ORDER — SODIUM CHLORIDE 0.9 % IV SOLN
2.0000 g | Freq: Once | INTRAVENOUS | Status: AC
  Administered 2023-09-14: 2 g via INTRAVENOUS
  Filled 2023-09-14: qty 20

## 2023-09-14 MED ORDER — SODIUM CHLORIDE 0.9 % IV BOLUS
1000.0000 mL | Freq: Once | INTRAVENOUS | Status: AC
  Administered 2023-09-14: 1000 mL via INTRAVENOUS

## 2023-09-14 MED ORDER — ONDANSETRON HCL 4 MG/2ML IJ SOLN
4.0000 mg | Freq: Once | INTRAMUSCULAR | Status: AC
  Administered 2023-09-14: 4 mg via INTRAVENOUS
  Filled 2023-09-14: qty 2

## 2023-09-14 MED ORDER — ALBUMIN HUMAN 25 % IV SOLN: 12.5000 g | INTRAVENOUS | Status: DC

## 2023-09-14 MED ORDER — SODIUM CHLORIDE 0.9 % IV BOLUS: 500.0000 mL | INTRAVENOUS | Status: DC

## 2023-09-14 MED ORDER — MORPHINE SULFATE (PF) 4 MG/ML IV SOLN
4.0000 mg | Freq: Once | INTRAVENOUS | Status: AC
  Administered 2023-09-14: 4 mg via INTRAVENOUS
  Filled 2023-09-14: qty 1

## 2023-09-14 MED ORDER — SODIUM CHLORIDE 0.9 % IV BOLUS: 500.0000 mL | Freq: Once | INTRAVENOUS | Status: DC

## 2023-09-14 MED ORDER — METRONIDAZOLE 500 MG/100ML IV SOLN
500.0000 mg | Freq: Once | INTRAVENOUS | Status: AC
  Administered 2023-09-14: 500 mg via INTRAVENOUS
  Filled 2023-09-14: qty 100

## 2023-09-14 NOTE — ED Notes (Signed)
Report given to the next RN.Marland KitchenMarland Kitchen

## 2023-09-14 NOTE — Plan of Care (Signed)
Plan of Care Note for accepted transfer  Patient: Ashley Galloway              NWG:[id number]  DOA: 09/14/2023     Facility requesting transfer: [facility] emergency department Requesting Provider: Evlyn Kanner, PA  Reason for transfer: Hepatic abscess/hepatic cystic mass, diverticulitis and transaminitis.  Facility course: Ashley Galloway is a 72 y.o. female history of cholecystectomy, hypertension, hyperlipidemia presented for weakness for the past week.  Patient says that she has epigastric pain that radiates to both flanks and down her legs.  Patient does have history of back surgery and states that she normally does have pain there radiates down her legs but is unsure if this is any different.  Patient is that she cannot eat without vomiting is has not taken any fluids or food over the past few days.  Patient also notes that she has had dark stools however this has been going on for years and denies any anticoagulation or NSAID use or Tylenol use.  Patient denies any dysuria, chest pain, shortness of breath, fevers.  Patient denies any recent travel out of the country.   At presentation to ED patient is hemodynamically stable. CMP showing hyponatremia 131, low chloride 96, BUN 40, elevated creatinine 1.15, transaminitis elevated AST 224, elevated ALT of 206, elevated bilirubin 1.3 and GFR 51.  Anion gap 13. On to find any lab on the chart to compare. CBC showing mild leukocytosis 14.6 otherwise unremarkable. UA hazy appearance, dipstick hemoglobin positive, protein 100, bacteria rare. Lipase 24. FOBT negative. Respiratory panel negative. Pending hepatitis panel. EKG showing normal sinus rhythm heart rate 72.  CT abdomen pelvis showed hepatic abscess/cystic mass.  Detail following. IMPRESSION: 1. Interval development of a multiloculated, heavily septated collection within the central aspects of segment 4-8 extending to the biliary hilum most in keeping with a  developing hepatic abscess measuring 11.0 x 8.7 x 12.4 cm in greatest dimension. Less likely, this may represent a mixed solid cystic mass. If blood cultures are nondiagnostic, needle sampling of the collection may be helpful to tailor antibiotic therapy though the heavily septated nature of the collection would preclude catheter drainage. 2. Severe descending and sigmoid colonic diverticulosis with superimposed mild pericolonic inflammatory stranding and mural thickening involving the proximal sigmoid colon suggestive of a very mild acute or subacute diverticulitis. No evidence of obstruction or perforation. 3. Punctate foci of gas within the bladder lumen nonspecific may relate to recent catheterization.  Chest x-ray showing mild bibasilar atelectasis.  Blood cultures has been obtained before patient has been started treating with IV ceftriaxone metronidazole.  Requested ED provider to speak with on-call general surgery regarding the hepatic abscess/cystic mass and further recommendation before I accept this patient for admission.  ED provider spoke with Dr. Merrilyn Puma general surgery Dr. Fredricka Bonine who will evaluate patient in the a.m.  Patient possibly need GI evaluation, spoke with GI Dr. Marina Goodell over phone and requested if needed reach out to on-call GI in the daytime.  Plan of care: The patient is accepted for admission for inpatient status to Telemetry unit, at [facility].  Check [email or web address] for on-call coverage.  [facility] will assume care on arrival to accepting facility. Until arrival, medical decision making responsibilities remain with the EDP.  However, [facility] available 24/7 for questions and assistance.   Nursing staff please page [facility] Admits and Consults [phone]) as soon as the patient arrives to the hospital.    Author: Tereasa Coop, MD  09/14/2023  [facility]

## 2023-09-14 NOTE — ED Notes (Signed)
Pt assisted to restroom at this time.

## 2023-09-14 NOTE — ED Triage Notes (Signed)
Abd pain and flank pain both sides. Radiates into legs-x1 week. Decreased appetite. Dark stools. Weakness.   Daily meds statins, asas, potassium supplement.

## 2023-09-14 NOTE — ED Provider Notes (Signed)
[place] EMERGENCY DEPARTMENT AT [facility] Provider Note   CSN: [id number] Arrival date & time: 09/14/23  1618     History  Chief Complaint  Patient presents with   Weakness    Ashley Galloway is a 72 y.o. female history of cholecystectomy, hypertension, hyperlipidemia presented for weakness for the past week.  Patient says that she has epigastric pain that radiates to both flanks and down her legs.  Patient does have history of back surgery and states that she normally does have pain there radiates down her legs but is unsure if this is any different.  Patient is that she cannot eat without vomiting is has not taken any fluids or food over the past few days.  Patient also notes that she has had dark stools however this has been going on for years and denies any anticoagulation or NSAID use or Tylenol use.  Patient denies any dysuria, chest pain, shortness of breath, fevers.  Patient denies any recent travel out of the country.  Patient denies history of hepatitis.  Patient does note that last week she was having viral symptoms with congestion and a nonproductive cough have since resolved.  Home Medications Prior to Admission medications   Not on File      Allergies    Patient has no allergy information on record.    Review of Systems   Review of Systems  Neurological:  Positive for weakness.    Physical Exam Updated Vital Signs BP (!) 129/56   Pulse 70   Temp 99.1 F (37.3 C)   Resp (!) 23   Wt 81.6 kg   SpO2 92%  Physical Exam Constitutional:      General: She is not in acute distress. Eyes:     Pupils: Pupils are equal, round, and reactive to light.  Cardiovascular:     Rate and Rhythm: Normal rate and regular rhythm.     Pulses: Normal pulses.     Heart sounds: Normal heart sounds.  Pulmonary:     Effort: Pulmonary effort is normal. No respiratory distress.     Breath sounds: Normal breath sounds.  Abdominal:     General: There is no  distension.     Palpations: Abdomen is soft.     Tenderness: There is abdominal tenderness (Epigastrium). There is no guarding or rebound.  Genitourinary:    Comments: Chaperone: Arnold Long, NT No obvious signs of hemorrhaging No obvious melena or hematochezia No rectal abnormalities Rectal tone intact Musculoskeletal:        General: Normal range of motion.     Cervical back: Normal range of motion. No rigidity.  Skin:    General: Skin is warm and dry.     Capillary Refill: Capillary refill takes less than 2 seconds.     Comments: No overlying skin color changes  Neurological:     Mental Status: She is alert and oriented to person, place, and time.  Psychiatric:        Mood and Affect: Mood normal.     ED Results / Procedures / Treatments   Labs (all labs ordered are listed, but only abnormal results are displayed) Labs Reviewed  COMPREHENSIVE METABOLIC PANEL - Abnormal; Notable for the following components:      Result Value   Sodium 131 (*)    Chloride 96 (*)    Glucose, Bld 158 (*)    BUN 40 (*)    Creatinine, Ser 1.15 (*)    AST 224 (*)  ALT 206 (*)    Total Bilirubin 1.3 (*)    GFR, Estimated 51 (*)    All other components within normal limits  CBC - Abnormal; Notable for the following components:   WBC 14.6 (*)    All other components within normal limits  URINALYSIS, ROUTINE W REFLEX MICROSCOPIC - Abnormal; Notable for the following components:   Color, Urine ORANGE (*)    APPearance HAZY (*)    Hgb urine dipstick MODERATE (*)    Protein, ur 100 (*)    Bacteria, UA RARE (*)    All other components within normal limits  RESP PANEL BY RT-PCR (RSV, FLU A&B, COVID)  RVPGX2  CULTURE, BLOOD (ROUTINE X 2)  CULTURE, BLOOD (ROUTINE X 2)  LIPASE, BLOOD  OCCULT BLOOD X 1 CARD TO LAB, STOOL  HEPATITIS PANEL, ACUTE  POC OCCULT BLOOD, ED    EKG None  Radiology CT ABDOMEN PELVIS W CONTRAST Result Date: 09/14/2023 CLINICAL DATA:  Epigastric abdominal pain  EXAM: CT ABDOMEN AND PELVIS WITH CONTRAST TECHNIQUE: Multidetector CT imaging of the abdomen and pelvis was performed using the standard protocol following bolus administration of intravenous contrast. RADIATION DOSE REDUCTION: This exam was performed according to the departmental dose-optimization program which includes automated exposure control, adjustment of the mA and/or kV according to patient size and/or use of iterative reconstruction technique. CONTRAST:  85mL OMNIPAQUE IOHEXOL 300 MG/ML  SOLN COMPARISON:  02/28/2021 FINDINGS: Lower chest: No acute abnormality. Hepatobiliary: Status post cholecystectomy. A multiloculated, heavily septated collection has developed within the central aspects of segment 4-8 extending to the biliary hilum most in keeping with a developing hepatic abscess measuring 11.0 x 8.7 x 12.4 cm in greatest dimension. Less likely, this may represent a mixed solid cystic mass. Mild extrahepatic biliary ductal dilation is stable, likely representing post cholecystectomy change. No intrahepatic biliary ductal dilation. Tiny cyst again noted within the hepatic dome. Pancreas: Unremarkable Spleen: Unremarkable Adrenals/Urinary Tract: The adrenal glands are unremarkable. The kidneys are normal in size and position. Vascular calcification noted within the renal hila bilaterally. Simple cortical cyst noted within the interpolar region of the right kidney for which no follow-up imaging is recommended. The kidneys are otherwise unremarkable. Bladder is decompressed. Punctate foci of gas within the bladder lumen nonspecific may relate to recent catheterization. Stomach/Bowel: There is severe descending and sigmoid colonic diverticulosis. There is superimposed mild pericolonic inflammatory stranding and mural thickening involving the proximal sigmoid colon suggestive of a very mild acute or subacute diverticulitis. There is no evidence of obstruction or perforation. No free intraperitoneal gas or  fluid. The stomach, small bowel, and large bowel are otherwise unremarkable. Appendix absent. Vascular/Lymphatic: Extensive aortoiliac atherosclerotic calcification. No aortic aneurysm. No pathologic adenopathy within the abdomen and pelvis. Reproductive: Uterus and bilateral adnexa are unremarkable. Other: Tiny fat containing umbilical hernia. Musculoskeletal: No acute bone abnormality. Osseous structures are age-appropriate. IMPRESSION: 1. Interval development of a multiloculated, heavily septated collection within the central aspects of segment 4-8 extending to the biliary hilum most in keeping with a developing hepatic abscess measuring 11.0 x 8.7 x 12.4 cm in greatest dimension. Less likely, this may represent a mixed solid cystic mass. If blood cultures are nondiagnostic, needle sampling of the collection may be helpful to tailor antibiotic therapy though the heavily septated nature of the collection would preclude catheter drainage. 2. Severe descending and sigmoid colonic diverticulosis with superimposed mild pericolonic inflammatory stranding and mural thickening involving the proximal sigmoid colon suggestive of a very mild acute or  subacute diverticulitis. No evidence of obstruction or perforation. 3. Punctate foci of gas within the bladder lumen nonspecific may relate to recent catheterization. Aortic Atherosclerosis (ICD10-I70.0). Electronically Signed   By: Helyn Numbers M.D.   On: 09/14/2023 20:50   DG Chest Port 1 View Result Date: 09/14/2023 CLINICAL DATA:  Abdominal pain, bilateral flank pain, weakness EXAM: PORTABLE CHEST 1 VIEW COMPARISON:  01/14/2005 FINDINGS: Mild bibasilar linear atelectasis. Mild elevation of right hemidiaphragm. No superimposed confluent pulmonary infiltrate. No pneumothorax or pleural effusion. Cardiac size within normal limits. Pulmonary vascularity is normal. No acute bone abnormality. IMPRESSION: 1. Mild bibasilar atelectasis. Electronically Signed   By: Helyn Numbers M.D.   On: 09/14/2023 20:39    Procedures .Critical Care  Performed by: Netta Corrigan, PA-C Authorized by: Netta Corrigan, PA-C   Critical care provider statement:    Critical care time (minutes):  40   Critical care time was exclusive of:  Separately billable procedures and treating other patients   Critical care was necessary to treat or prevent imminent or life-threatening deterioration of the following conditions: Hepatic abscess requiring IV antibiotics.   Critical care was time spent personally by me on the following activities:  Blood draw for specimens, development of treatment plan with patient or surrogate, discussions with consultants, evaluation of patient's response to treatment, examination of patient, obtaining history from patient or surrogate, review of old charts, re-evaluation of patient's condition, pulse oximetry, ordering and review of radiographic studies, ordering and review of laboratory studies and ordering and performing treatments and interventions   I assumed direction of critical care for this patient from another provider in my specialty: no     Care discussed with: admitting provider       Medications Ordered in ED Medications  metroNIDAZOLE (FLAGYL) IVPB 500 mg (has no administration in time range)  ondansetron (ZOFRAN) injection 4 mg (4 mg Intravenous Given 09/14/23 1930)  morphine (PF) 4 MG/ML injection 4 mg (4 mg Intravenous Given 09/14/23 1932)  sodium chloride 0.9 % bolus 1,000 mL (0 mLs Intravenous Stopped 09/14/23 2057)  iohexol (OMNIPAQUE) 300 MG/ML solution 100 mL (85 mLs Intravenous Contrast Given 09/14/23 1917)  cefTRIAXone (ROCEPHIN) 2 g in sodium chloride 0.9 % 100 mL IVPB (2 g Intravenous New Bag/Given 09/14/23 2130)    ED Course/ Medical Decision Making/ A&P                                 Medical Decision Making Amount and/or Complexity of Data Reviewed Labs: ordered. Radiology: ordered.  Risk Prescription drug  management.   Ashley Galloway 72 y.o. presented today for weakness. Working DDx that I considered at this time includes, but not limited to, electrolyte abnormalities, anemia, GI bleed, hepatitis, cancer, viral symptoms, pneumonia, sepsis.  R/o DDx: electrolyte abnormalities, anemia, GI bleed, hepatitis, cancer, viral symptoms, pneumonia, sepsis.: These are considered less likely due to history of present illness, physical exam, labs/imaging findings  Review of prior external notes: 03/10/2023 office visit  Unique Tests and My Interpretation: CBC: Leukocytosis 14.6 CMP: Transaminitis 224/206 UA: Squamous epithelial cells present, rare bacteria Respiratory panel: Negative Occult card: Negative Hepatitis panel: EKG: 70 bpm, no ST elevations or depressions noted no indicative of ischemia, no blocks noted Chest x-ray: Bilateral basilar atelectasis CT abdomen pelvis with contrast: Hepatic abscess noted with possible subacute/acute diverticulitis  Social Determinants of Health: none  Discussion with Independent Historian:  Husband  Discussion  of Management of Tests:  Sundil, MD Hospitalist ; Fredricka Bonine, MD General Surgery  Risk: High: hospitalization or escalation of hospital-level care  Risk Stratification Score: None  Staffed with Rubin Payor, MD  Plan: On exam patient was no acute distress but was noted to be mildly hypotensive systolic of 90 until begin fluids.  Patient's physical exam was did show epigastric tenderness and so with her weakness will obtain imaging.  With a chaperone in the room a GU exam was conducted that does not show any blood able with the occult test.  Patient's labs do show transaminitis and so unclear as to the pathology of this and so hepatitis panel was also added.  Patient also does have leukocytosis of 14.6.  Patient was endorsing URI-like symptoms last week and so chest x-ray was ordered along with respiratory panel which were both negative.  CT scan  does show hepatic abscess and so blood cultures were obtained along with starting IV antibiotics.  I spoke to the hospitalist who would like [place] to consult general surgery first before putting in orders.  Will consult general surgery.  I spoke to the general surgeon on-call and they state they will put the patient on the list and see her in the morning.  Hospitalist was updated of this.  Hospitalist accepted patient for admission.  Patient stable for admission at this time.  This chart was dictated using voice recognition software.  Despite best efforts to proofread,  errors can occur which can change the documentation meaning.         Final Clinical Impression(s) / ED Diagnoses Final diagnoses:  Hepatic abscess    Rx / DC Orders ED Discharge Orders     None         Remi Deter 09/14/23 2204    Benjiman Core, MD 09/14/23 [phone]

## 2023-09-14 NOTE — ED Notes (Signed)
[facility] at bedside to transport pt to [place] 

## 2023-09-15 ENCOUNTER — Inpatient Hospital Stay (HOSPITAL_COMMUNITY): Payer: Medicare HMO

## 2023-09-15 ENCOUNTER — Encounter (HOSPITAL_COMMUNITY): Payer: Self-pay | Admitting: Internal Medicine

## 2023-09-15 DIAGNOSIS — K5730 Diverticulosis of large intestine without perforation or abscess without bleeding: Secondary | ICD-10-CM | POA: Diagnosis not present

## 2023-09-15 DIAGNOSIS — F17210 Nicotine dependence, cigarettes, uncomplicated: Secondary | ICD-10-CM | POA: Diagnosis not present

## 2023-09-15 DIAGNOSIS — K750 Abscess of liver: Secondary | ICD-10-CM

## 2023-09-15 DIAGNOSIS — M5416 Radiculopathy, lumbar region: Secondary | ICD-10-CM

## 2023-09-15 DIAGNOSIS — M5137 Other intervertebral disc degeneration, lumbosacral region with discogenic back pain only: Secondary | ICD-10-CM | POA: Diagnosis not present

## 2023-09-15 DIAGNOSIS — J9811 Atelectasis: Secondary | ICD-10-CM | POA: Diagnosis not present

## 2023-09-15 DIAGNOSIS — Z1152 Encounter for screening for COVID-19: Secondary | ICD-10-CM | POA: Diagnosis not present

## 2023-09-15 DIAGNOSIS — M5410 Radiculopathy, site unspecified: Secondary | ICD-10-CM | POA: Diagnosis present

## 2023-09-15 DIAGNOSIS — M48061 Spinal stenosis, lumbar region without neurogenic claudication: Secondary | ICD-10-CM | POA: Diagnosis present

## 2023-09-15 DIAGNOSIS — Z8673 Personal history of transient ischemic attack (TIA), and cerebral infarction without residual deficits: Secondary | ICD-10-CM | POA: Diagnosis not present

## 2023-09-15 DIAGNOSIS — R1084 Generalized abdominal pain: Secondary | ICD-10-CM | POA: Diagnosis not present

## 2023-09-15 DIAGNOSIS — R7881 Bacteremia: Secondary | ICD-10-CM | POA: Diagnosis present

## 2023-09-15 DIAGNOSIS — D72829 Elevated white blood cell count, unspecified: Secondary | ICD-10-CM | POA: Diagnosis present

## 2023-09-15 DIAGNOSIS — Z7982 Long term (current) use of aspirin: Secondary | ICD-10-CM | POA: Diagnosis not present

## 2023-09-15 DIAGNOSIS — I10 Essential (primary) hypertension: Secondary | ICD-10-CM | POA: Diagnosis present

## 2023-09-15 DIAGNOSIS — K838 Other specified diseases of biliary tract: Secondary | ICD-10-CM | POA: Diagnosis not present

## 2023-09-15 DIAGNOSIS — M4807 Spinal stenosis, lumbosacral region: Secondary | ICD-10-CM | POA: Diagnosis not present

## 2023-09-15 DIAGNOSIS — E876 Hypokalemia: Secondary | ICD-10-CM | POA: Diagnosis not present

## 2023-09-15 DIAGNOSIS — M5136 Other intervertebral disc degeneration, lumbar region with discogenic back pain only: Secondary | ICD-10-CM | POA: Diagnosis not present

## 2023-09-15 DIAGNOSIS — K5732 Diverticulitis of large intestine without perforation or abscess without bleeding: Secondary | ICD-10-CM | POA: Diagnosis not present

## 2023-09-15 DIAGNOSIS — E785 Hyperlipidemia, unspecified: Secondary | ICD-10-CM | POA: Diagnosis present

## 2023-09-15 DIAGNOSIS — I959 Hypotension, unspecified: Secondary | ICD-10-CM | POA: Diagnosis not present

## 2023-09-15 DIAGNOSIS — K5792 Diverticulitis of intestine, part unspecified, without perforation or abscess without bleeding: Secondary | ICD-10-CM | POA: Diagnosis present

## 2023-09-15 DIAGNOSIS — Z9049 Acquired absence of other specified parts of digestive tract: Secondary | ICD-10-CM | POA: Diagnosis not present

## 2023-09-15 DIAGNOSIS — E871 Hypo-osmolality and hyponatremia: Secondary | ICD-10-CM | POA: Diagnosis present

## 2023-09-15 DIAGNOSIS — N281 Cyst of kidney, acquired: Secondary | ICD-10-CM | POA: Diagnosis not present

## 2023-09-15 DIAGNOSIS — J432 Centrilobular emphysema: Secondary | ICD-10-CM | POA: Diagnosis not present

## 2023-09-15 DIAGNOSIS — Z91048 Other nonmedicinal substance allergy status: Secondary | ICD-10-CM | POA: Diagnosis not present

## 2023-09-15 DIAGNOSIS — Z79899 Other long term (current) drug therapy: Secondary | ICD-10-CM | POA: Diagnosis not present

## 2023-09-15 DIAGNOSIS — Z860100 Personal history of colon polyps, unspecified: Secondary | ICD-10-CM | POA: Diagnosis not present

## 2023-09-15 DIAGNOSIS — B955 Unspecified streptococcus as the cause of diseases classified elsewhere: Secondary | ICD-10-CM | POA: Diagnosis present

## 2023-09-15 LAB — COMPREHENSIVE METABOLIC PANEL
ALT: 171 U/L — ABNORMAL HIGH (ref 0–44)
AST: 201 U/L — ABNORMAL HIGH (ref 15–41)
Albumin: 1.9 g/dL — ABNORMAL LOW (ref 3.5–5.0)
Alkaline Phosphatase: 91 U/L (ref 38–126)
Anion gap: 11 (ref 5–15)
BUN: 29 mg/dL — ABNORMAL HIGH (ref 8–23)
CO2: 20 mmol/L — ABNORMAL LOW (ref 22–32)
Calcium: 7.8 mg/dL — ABNORMAL LOW (ref 8.9–10.3)
Chloride: 102 mmol/L (ref 98–111)
Creatinine, Ser: 0.96 mg/dL (ref 0.44–1.00)
GFR, Estimated: >60 mL/min (ref >60)
Glucose, Bld: 116 mg/dL — ABNORMAL HIGH (ref 70–99)
Potassium: 3.3 mmol/L — ABNORMAL LOW (ref 3.5–5.1)
Sodium: 133 mmol/L — ABNORMAL LOW (ref 135–145)
Total Bilirubin: 1 mg/dL (ref <1.2)
Total Protein: 5.7 g/dL — ABNORMAL LOW (ref 6.5–8.1)

## 2023-09-15 LAB — CBC
HCT: 30.5 % — ABNORMAL LOW (ref 36.0–46.0)
Hemoglobin: 10.5 g/dL — ABNORMAL LOW (ref 12.0–15.0)
MCH: 29.7 pg (ref 26.0–34.0)
MCHC: 34.4 g/dL (ref 30.0–36.0)
MCV: 86.2 fL (ref 80.0–100.0)
Platelets: 169 10*3/uL (ref 150–400)
RBC: 3.54 MIL/uL — ABNORMAL LOW (ref 3.87–5.11)
RDW: 13.8 % (ref 11.5–15.5)
WBC: 12.1 10*3/uL — ABNORMAL HIGH (ref 4.0–10.5)
nRBC: 0 % (ref 0.0–0.2)

## 2023-09-15 LAB — GLUCOSE, CAPILLARY: Glucose-Capillary: 108 mg/dL — ABNORMAL HIGH (ref 70–99)

## 2023-09-15 MED ORDER — ONDANSETRON HCL 4 MG PO TABS: 4.0000 mg | ORAL_TABLET | Freq: Four times a day (QID) | ORAL | Status: DC | PRN

## 2023-09-15 MED ORDER — MORPHINE SULFATE (PF) 2 MG/ML IV SOLN
2.0000 mg | INTRAVENOUS | Status: DC | PRN
  Administered 2023-09-16 (×2): 2 mg via INTRAVENOUS
  Filled 2023-09-15: qty 2
  Filled 2023-09-15: qty 1

## 2023-09-15 MED ORDER — GADOBUTROL 1 MMOL/ML IV SOLN
8.0000 mL | Freq: Once | INTRAVENOUS | Status: AC | PRN
Start: 2023-09-15 — End: 2023-09-15
  Administered 2023-09-15: 8 mL via INTRAVENOUS

## 2023-09-15 MED ORDER — SODIUM CHLORIDE 0.9 % IV SOLN
2.0000 g | INTRAVENOUS | Status: DC
  Administered 2023-09-15 – 2023-09-18 (×4): 2 g via INTRAVENOUS
  Filled 2023-09-15 (×4): qty 20

## 2023-09-15 MED ORDER — DEXTROSE IN LACTATED RINGERS 5 % IV SOLN: INTRAVENOUS | Status: AC

## 2023-09-15 MED ORDER — SODIUM CHLORIDE 0.9 % IV SOLN: Freq: Once | INTRAVENOUS | Status: AC

## 2023-09-15 MED ORDER — ACETAMINOPHEN 650 MG RE SUPP: 650.0000 mg | Freq: Four times a day (QID) | RECTAL | Status: DC | PRN

## 2023-09-15 MED ORDER — ONDANSETRON HCL 4 MG/2ML IJ SOLN
4.0000 mg | Freq: Four times a day (QID) | INTRAMUSCULAR | Status: DC | PRN
Start: 2023-09-15 — End: 2023-09-19

## 2023-09-15 MED ORDER — ENOXAPARIN SODIUM 40 MG/0.4ML IJ SOSY
40.0000 mg | PREFILLED_SYRINGE | INTRAMUSCULAR | Status: DC
  Administered 2023-09-15 – 2023-09-18 (×4): 40 mg via SUBCUTANEOUS
  Filled 2023-09-15 (×4): qty 0.4

## 2023-09-15 MED ORDER — SODIUM CHLORIDE 0.9 % IV BOLUS
500.0000 mL | Freq: Once | INTRAVENOUS | Status: AC
  Administered 2023-09-15: 500 mL via INTRAVENOUS

## 2023-09-15 MED ORDER — METRONIDAZOLE 500 MG/100ML IV SOLN
500.0000 mg | Freq: Two times a day (BID) | INTRAVENOUS | Status: DC
  Administered 2023-09-15 – 2023-09-19 (×9): 500 mg via INTRAVENOUS
  Filled 2023-09-15 (×9): qty 100

## 2023-09-15 MED ORDER — ENOXAPARIN SODIUM 40 MG/0.4ML IJ SOSY
40.0000 mg | PREFILLED_SYRINGE | INTRAMUSCULAR | Status: DC
Start: 2023-09-15 — End: 2023-09-15

## 2023-09-15 MED ORDER — ACETAMINOPHEN 325 MG PO TABS
650.0000 mg | ORAL_TABLET | Freq: Four times a day (QID) | ORAL | Status: DC | PRN
  Administered 2023-09-15: 650 mg via ORAL
  Filled 2023-09-15 (×2): qty 2

## 2023-09-15 NOTE — H&P (Addendum)
History and Physical    Patient: Ashley Galloway ZOX:[id number] DOB: [birth year] DOA: 09/14/2023 DOS: the patient was seen and examined on 09/15/2023 PCP: Renford Dills, MD  Patient coming from: Home  Chief Complaint:  Chief Complaint  Patient presents with   Weakness   HPI: Ashley Galloway is a 72 y.o. female with medical history significant of Colon polyps, emphysema, TIA, diverticulitis, lumbar spine surgery.  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  Also had chronic cholecystitis at that time with resection of gallbladder.  No malignancy detected on pathology of either.  Sounds like she is up to date with colonoscopy screening.  I see a visit with Dr. Marca Ancona in 2021, though she thinks her most recent colonoscopy was more like 2023: 1 "small colon polyp" per pt report.  Pt in to ED with c/o LE weakness for past week.  Has had some progressive weakness in LE, R>L for a number of months now (started in summer maybe), but worse for past week.  For past week having vomiting with any PO intake.  Patient denies any dysuria, chest pain, shortness of breath, fevers. Patient denies any recent travel out of the country. Patient denies history of hepatitis. Patient does note that last week she was having viral symptoms with congestion and a nonproductive cough have since resolved.   In ED: CT AP = 1) large septated fluid collection in liver, abscess preferred over cystic mass by radiologist, 2) subacute diverticulitis.  Tm 100.8 in ED.  WBC 14k  LFTs in the 200s  Started on rocephin + flagyl.  Hospitalist asked to admit.   Review of Systems: As mentioned in the history of present illness. All other systems reviewed and are negative. Past Medical History:  Diagnosis Date   Centrilobular emphysema (HCC)    Colon polyps    Looks like saw Dr. Marca Ancona in 2021   Diverticulitis    HLD (hyperlipidemia)    TIA (transient  ischemic attack)    Vesicointestinal fistula    Past Surgical History:  Procedure Laterality Date   CHOLECYSTECTOMY     PARTIAL COLECTOMY     Tubulovillous adenoma   Social History:  reports that she has been smoking cigarettes. She has been exposed to tobacco smoke. She has never used smokeless tobacco. No history on file for alcohol use and drug use.  Not on File  Family History  Problem Relation Age of Onset   Breast cancer Neg Hx     Prior to Admission medications   Not on File    Physical Exam: Vitals:   09/14/23 2100 09/14/23 2126 09/15/23 0020 09/15/23 0037  BP: (!) 129/56   (!) 112/58  Pulse: 70   76  Resp: (!) 23   20  Temp:    (!) 100.8 F (38.2 C)  TempSrc:    Oral  SpO2: 92%   100%  Weight:  81.6 kg 84.4 kg   Height:   5\' 7"  (1.702 m)    Constitutional: NAD, calm, comfortable Respiratory: clear to auscultation bilaterally, no wheezing, no crackles. Normal respiratory effort. No accessory muscle use.  Cardiovascular: Tachycardic Abdomen: TTP Neurologic: CN 2-12 grossly intact. Sensation intact, DTR normal. Strength 5/5 in all 4.  Psychiatric: Normal judgment and insight. Alert and oriented x 3. Normal mood.   Data Reviewed:    Labs on Admission: I have personally reviewed following labs and imaging studies  CBC: Recent Labs  Lab 09/14/23 1638  WBC 14.6*  HGB 12.7  HCT 36.8  MCV 86.2  PLT 201   Basic Metabolic Panel: Recent Labs  Lab 09/14/23 1638  NA 131*  K 3.5  CL 96*  CO2 22  GLUCOSE 158*  BUN 40*  CREATININE 1.15*  CALCIUM 9.5   GFR: Estimated Creatinine Clearance: 49.4 mL/min (A) (by C-G formula based on SCr of 1.15 mg/dL (H)). Liver Function Tests: Recent Labs  Lab 09/14/23 1638  AST 224*  ALT 206*  ALKPHOS 117  BILITOT 1.3*  PROT 7.7  ALBUMIN 3.7   Recent Labs  Lab 09/14/23 1638  LIPASE 24   No results for input(s): "AMMONIA" in the last 168 hours. Coagulation Profile: No results for input(s): "INR", "PROTIME"  in the last 168 hours. Cardiac Enzymes: No results for input(s): "CKTOTAL", "CKMB", "CKMBINDEX", "TROPONINI" in the last 168 hours. BNP (last 3 results) No results for input(s): "PROBNP" in the last 8760 hours. HbA1C: No results for input(s): "HGBA1C" in the last 72 hours. CBG: No results for input(s): "GLUCAP" in the last 168 hours. Lipid Profile: No results for input(s): "CHOL", "HDL", "LDLCALC", "TRIG", "CHOLHDL", "LDLDIRECT" in the last 72 hours. Thyroid Function Tests: No results for input(s): "TSH", "T4TOTAL", "FREET4", "T3FREE", "THYROIDAB" in the last 72 hours. Anemia Panel: No results for input(s): "VITAMINB12", "FOLATE", "FERRITIN", "TIBC", "IRON", "RETICCTPCT" in the last 72 hours. Urine analysis:    Component Value Date/Time   COLORURINE ORANGE (A) 09/14/2023 1638   APPEARANCEUR HAZY (A) 09/14/2023 1638   LABSPEC 1.025 09/14/2023 1638   PHURINE 6.0 09/14/2023 1638   GLUCOSEU NEGATIVE 09/14/2023 1638   HGBUR MODERATE (A) 09/14/2023 1638   BILIRUBINUR NEGATIVE 09/14/2023 1638   KETONESUR NEGATIVE 09/14/2023 1638   PROTEINUR 100 (A) 09/14/2023 1638   NITRITE NEGATIVE 09/14/2023 1638   LEUKOCYTESUR NEGATIVE 09/14/2023 1638    Radiological Exams on Admission: CT ABDOMEN PELVIS W CONTRAST Result Date: 09/14/2023 CLINICAL DATA:  Epigastric abdominal pain EXAM: CT ABDOMEN AND PELVIS WITH CONTRAST TECHNIQUE: Multidetector CT imaging of the abdomen and pelvis was performed using the standard protocol following bolus administration of intravenous contrast. RADIATION DOSE REDUCTION: This exam was performed according to the departmental dose-optimization program which includes automated exposure control, adjustment of the mA and/or kV according to patient size and/or use of iterative reconstruction technique. CONTRAST:  85mL OMNIPAQUE IOHEXOL 300 MG/ML  SOLN COMPARISON:  02/28/2021 FINDINGS: Lower chest: No acute abnormality. Hepatobiliary: Status post cholecystectomy. A  multiloculated, heavily septated collection has developed within the central aspects of segment 4-8 extending to the biliary hilum most in keeping with a developing hepatic abscess measuring 11.0 x 8.7 x 12.4 cm in greatest dimension. Less likely, this may represent a mixed solid cystic mass. Mild extrahepatic biliary ductal dilation is stable, likely representing post cholecystectomy change. No intrahepatic biliary ductal dilation. Tiny cyst again noted within the hepatic dome. Pancreas: Unremarkable Spleen: Unremarkable Adrenals/Urinary Tract: The adrenal glands are unremarkable. The kidneys are normal in size and position. Vascular calcification noted within the renal hila bilaterally. Simple cortical cyst noted within the interpolar region of the right kidney for which no follow-up imaging is recommended. The kidneys are otherwise unremarkable. Bladder is decompressed. Punctate foci of gas within the bladder lumen nonspecific may relate to recent catheterization. Stomach/Bowel: There is severe descending and sigmoid colonic diverticulosis. There is superimposed mild pericolonic inflammatory stranding and mural thickening involving the proximal sigmoid colon suggestive of a very mild acute or subacute diverticulitis. There is no evidence of obstruction or perforation. No  free intraperitoneal gas or fluid. The stomach, small bowel, and large bowel are otherwise unremarkable. Appendix absent. Vascular/Lymphatic: Extensive aortoiliac atherosclerotic calcification. No aortic aneurysm. No pathologic adenopathy within the abdomen and pelvis. Reproductive: Uterus and bilateral adnexa are unremarkable. Other: Tiny fat containing umbilical hernia. Musculoskeletal: No acute bone abnormality. Osseous structures are age-appropriate. IMPRESSION: 1. Interval development of a multiloculated, heavily septated collection within the central aspects of segment 4-8 extending to the biliary hilum most in keeping with a developing  hepatic abscess measuring 11.0 x 8.7 x 12.4 cm in greatest dimension. Less likely, this may represent a mixed solid cystic mass. If blood cultures are nondiagnostic, needle sampling of the collection may be helpful to tailor antibiotic therapy though the heavily septated nature of the collection would preclude catheter drainage. 2. Severe descending and sigmoid colonic diverticulosis with superimposed mild pericolonic inflammatory stranding and mural thickening involving the proximal sigmoid colon suggestive of a very mild acute or subacute diverticulitis. No evidence of obstruction or perforation. 3. Punctate foci of gas within the bladder lumen nonspecific may relate to recent catheterization. Aortic Atherosclerosis (ICD10-I70.0). Electronically Signed   By: Helyn Numbers M.D.   On: 09/14/2023 20:50   DG Chest Port 1 View Result Date: 09/14/2023 CLINICAL DATA:  Abdominal pain, bilateral flank pain, weakness EXAM: PORTABLE CHEST 1 VIEW COMPARISON:  01/14/2005 FINDINGS: Mild bibasilar linear atelectasis. Mild elevation of right hemidiaphragm. No superimposed confluent pulmonary infiltrate. No pneumothorax or pleural effusion. Cardiac size within normal limits. Pulmonary vascularity is normal. No acute bone abnormality. IMPRESSION: 1. Mild bibasilar atelectasis. Electronically Signed   By: Helyn Numbers M.D.   On: 09/14/2023 20:39    EKG: Independently reviewed.   Assessment and Plan: * Hepatic abscess Possibly spread from diverticulitis given the subacute appearing nature of diverticulitis on CT today? Continue empiric rocephin + flagyl BCx pending Gen surg to see in consult in AM Ordering MR w and w/o liver to see if we can get any further info non-invasively. Next step would be IR aspiration / biopsy / drain what they can Keeping NPO in case this can be done later in day today Also delay onset of DVT ppx to 1800 Ordering AFP, CEA, and CA 19-9  Radiculopathy Pt also having symptoms that  sound suspicious for lumbar radiculopathy. Since getting MRI of liver anyhow + h/o lumbar spinal surgery some years ago, will go ahead and get MR of L spine as well  Diverticulitis Looks like pt also has diverticulitis of large intestine. On ABx as above      Advance Care Planning:   Code Status: Full Code  Consults: Gen surg  Family Communication: No family in room  Severity of Illness: The appropriate patient status for this patient is INPATIENT. Inpatient status is judged to be reasonable and necessary in order to provide the required intensity of service to ensure the patient's safety. The patient's presenting symptoms, physical exam findings, and initial radiographic and laboratory data in the context of their chronic comorbidities is felt to place them at high risk for further clinical deterioration. Furthermore, it is not anticipated that the patient will be medically stable for discharge from the hospital within 2 midnights of admission.   * I certify that at the point of admission it is my clinical judgment that the patient will require inpatient hospital care spanning beyond 2 midnights from the point of admission due to high intensity of service, high risk for further deterioration and high frequency of surveillance required.*  Author:  Hillary Bow., DO 09/15/2023 1:44 AM  For on call review [email or web address].

## 2023-09-15 NOTE — Assessment & Plan Note (Addendum)
-   per CT: " Interval development of a multiloculated, heavily septated collection within the central aspects of segment 4-8 extending to the biliary hilum most in keeping with a developing hepatic abscess measuring 11.0 x 8.7 x 12.4 cm in greatest dimension" - continue rocephin and flagyl; awaiting final sensitivities - suspect the strep bacteremia is translocated from hepatic abscess as well; fluid culture from abscess also growing strep intermedius -General Surgery following, appreciate assistance - patient underwent IR drain placement on 12/18; follow up fluid culture - ID consulted given overall complexity and rec's for course length etc -Tentative plan for 6-week course of Rocephin per ID.  PICC line ordered 09/18/2023

## 2023-09-15 NOTE — Progress Notes (Signed)
Pt. Off the floor. Pt transported to radiology for MRI.

## 2023-09-15 NOTE — Progress Notes (Addendum)
Pt admitted to room 6n30 from [facility] by [facility]. Denies nausea/pain at this time. Husband at bedside. Instructed to call for assistance when needs to get OOB. Bed in low positions, call light within reach, bed alarm on.[facility] admits notified of admission.

## 2023-09-15 NOTE — Plan of Care (Signed)
  Problem: Clinical Measurements: Goal: Will remain free from infection Outcome: Progressing   Problem: Clinical Measurements: Goal: Diagnostic test results will improve Outcome: Progressing   Problem: Clinical Measurements: Goal: Cardiovascular complication will be avoided Outcome: Progressing   Problem: Activity: Goal: Risk for activity intolerance will decrease Outcome: Progressing   Problem: Nutrition: Goal: Adequate nutrition will be maintained Outcome: Progressing   Problem: Pain Management: Goal: General experience of comfort will improve Outcome: Progressing   Problem: Safety: Goal: Ability to remain free from injury will improve Outcome: Progressing   Problem: Skin Integrity: Goal: Risk for impaired skin integrity will decrease Outcome: Progressing

## 2023-09-15 NOTE — Assessment & Plan Note (Addendum)
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

## 2023-09-15 NOTE — Assessment & Plan Note (Addendum)
-   mild and noted on CT A/P as well on admission - diet advanced slowly; can continue low fiber diet on d/c - continue flagyl for 2 weeks total; will also be on rocephin as per above

## 2023-09-15 NOTE — Consult Note (Signed)
Surgical Evaluation Requesting provider: Evlyn Kanner PA-C  Chief Complaint: Abdominal pain  HPI: 72 year old woman with a history of cholecystectomy and partial colectomy who presented to med center at [facility] yesterday afternoon with abdominal pain and bilateral flank pain radiating into her legs.  This has been going on for about 1 week and associated with decreased appetite, vomiting, weakness and dark stools. Currently she reports the pain is significantly better.   Not on File  Past Medical History:  Diagnosis Date   Centrilobular emphysema (HCC)    Colon polyps    Looks like saw Dr. Marca Ancona in 2021   Diverticulitis    HLD (hyperlipidemia)    TIA (transient ischemic attack)    Vesicointestinal fistula     Past Surgical History:  Procedure Laterality Date   CHOLECYSTECTOMY     PARTIAL COLECTOMY     Tubulovillous adenoma    Family History  Problem Relation Age of Onset   Breast cancer Neg Hx     Social History   Socioeconomic History   Marital status: Married    Spouse name: Not on file   Number of children: Not on file   Years of education: Not on file   Highest education level: Not on file  Occupational History   Not on file  Tobacco Use   Smoking status: Every Day    Types: Cigarettes    Passive exposure: Current   Smokeless tobacco: Never  Substance and Sexual Activity   Alcohol use: Not on file   Drug use: Not on file   Sexual activity: Not on file  Other Topics Concern   Not on file  Social History Narrative   Not on file   Social Drivers of Health   Financial Resource Strain: Not on file  Food Insecurity: No Food Insecurity (09/15/2023)   Hunger Vital Sign    Worried About Running Out of Food in the Last Year: Never true    Ran Out of Food in the Last Year: Never true  Transportation Needs: No Transportation Needs (09/15/2023)   PRAPARE - Administrator, Civil Service (Medical): No    Lack of Transportation (Non-Medical): No   Physical Activity: Not on file  Stress: Not on file  Social Connections: Not on file    No current facility-administered medications on file prior to encounter.   No current outpatient medications on file prior to encounter.    Review of Systems: a complete, 10pt review of systems was completed with pertinent positives and negatives as documented in the HPI  Physical Exam: Vitals:   09/14/23 2100 09/15/23 0037  BP: (!) 129/56 (!) 112/58  Pulse: 70 76  Resp: (!) 23 20  Temp:  (!) 100.8 F (38.2 C)  SpO2: 92% 100%   Gen: A&Ox3, no distress  Eyes: lids and conjunctivae normal, no icterus. Pupils equally round and reactive to light.  Neck: supple without mass or thyromegaly Chest: respiratory effort is normal. No crepitus or tenderness on palpation of the chest. Breath sounds equal.  Cardiovascular: RRR with palpable distal pulses, no pedal edema Gastrointestinal: soft, nondistended, currently completely nontender. Well healed right subcostal incision and infraumbilical port site incision. No hernia or palpable mass.   Muscoloskeletal: no clubbing or cyanosis of the fingers.  Strength is symmetrical throughout.  Range of motion of bilateral upper and lower extremities normal without pain, crepitation or contracture. Neuro: cranial nerves grossly intact.  Sensation intact to light touch diffusely. Psych: appropriate mood and affect, normal  insight/judgment intact  Skin: warm and dry      Latest Ref Rng & Units 09/14/2023    4:38 PM  CBC  WBC 4.0 - 10.5 K/uL 14.6   Hemoglobin 12.0 - 15.0 g/dL 19.1   Hematocrit 47.8 - 46.0 % 36.8   Platelets 150 - 400 K/uL 201        Latest Ref Rng & Units 09/14/2023    4:38 PM  CMP  Glucose 70 - 99 mg/dL 295   BUN 8 - 23 mg/dL 40   Creatinine 6.21 - 1.00 mg/dL 3.08   Sodium 657 - 846 mmol/L 131   Potassium 3.5 - 5.1 mmol/L 3.5   Chloride 98 - 111 mmol/L 96   CO2 22 - 32 mmol/L 22   Calcium 8.9 - 10.3 mg/dL 9.5   Total Protein 6.5 -  8.1 g/dL 7.7   Total Bilirubin <9.6 mg/dL 1.3   Alkaline Phos 38 - 126 U/L 117   AST 15 - 41 U/L 224   ALT 0 - 44 U/L 206     No results found for: "INR", "PROTIME"  Imaging: CT ABDOMEN PELVIS W CONTRAST Result Date: 09/14/2023 CLINICAL DATA:  Epigastric abdominal pain EXAM: CT ABDOMEN AND PELVIS WITH CONTRAST TECHNIQUE: Multidetector CT imaging of the abdomen and pelvis was performed using the standard protocol following bolus administration of intravenous contrast. RADIATION DOSE REDUCTION: This exam was performed according to the departmental dose-optimization program which includes automated exposure control, adjustment of the mA and/or kV according to patient size and/or use of iterative reconstruction technique. CONTRAST:  85mL OMNIPAQUE IOHEXOL 300 MG/ML  SOLN COMPARISON:  02/28/2021 FINDINGS: Lower chest: No acute abnormality. Hepatobiliary: Status post cholecystectomy. A multiloculated, heavily septated collection has developed within the central aspects of segment 4-8 extending to the biliary hilum most in keeping with a developing hepatic abscess measuring 11.0 x 8.7 x 12.4 cm in greatest dimension. Less likely, this may represent a mixed solid cystic mass. Mild extrahepatic biliary ductal dilation is stable, likely representing post cholecystectomy change. No intrahepatic biliary ductal dilation. Tiny cyst again noted within the hepatic dome. Pancreas: Unremarkable Spleen: Unremarkable Adrenals/Urinary Tract: The adrenal glands are unremarkable. The kidneys are normal in size and position. Vascular calcification noted within the renal hila bilaterally. Simple cortical cyst noted within the interpolar region of the right kidney for which no follow-up imaging is recommended. The kidneys are otherwise unremarkable. Bladder is decompressed. Punctate foci of gas within the bladder lumen nonspecific may relate to recent catheterization. Stomach/Bowel: There is severe descending and sigmoid colonic  diverticulosis. There is superimposed mild pericolonic inflammatory stranding and mural thickening involving the proximal sigmoid colon suggestive of a very mild acute or subacute diverticulitis. There is no evidence of obstruction or perforation. No free intraperitoneal gas or fluid. The stomach, small bowel, and large bowel are otherwise unremarkable. Appendix absent. Vascular/Lymphatic: Extensive aortoiliac atherosclerotic calcification. No aortic aneurysm. No pathologic adenopathy within the abdomen and pelvis. Reproductive: Uterus and bilateral adnexa are unremarkable. Other: Tiny fat containing umbilical hernia. Musculoskeletal: No acute bone abnormality. Osseous structures are age-appropriate. IMPRESSION: 1. Interval development of a multiloculated, heavily septated collection within the central aspects of segment 4-8 extending to the biliary hilum most in keeping with a developing hepatic abscess measuring 11.0 x 8.7 x 12.4 cm in greatest dimension. Less likely, this may represent a mixed solid cystic mass. If blood cultures are nondiagnostic, needle sampling of the collection may be helpful to tailor antibiotic therapy though the heavily septated  nature of the collection would preclude catheter drainage. 2. Severe descending and sigmoid colonic diverticulosis with superimposed mild pericolonic inflammatory stranding and mural thickening involving the proximal sigmoid colon suggestive of a very mild acute or subacute diverticulitis. No evidence of obstruction or perforation. 3. Punctate foci of gas within the bladder lumen nonspecific may relate to recent catheterization. Aortic Atherosclerosis (ICD10-I70.0). Electronically Signed   By: Helyn Numbers M.D.   On: 09/14/2023 20:50   DG Chest Port 1 View Result Date: 09/14/2023 CLINICAL DATA:  Abdominal pain, bilateral flank pain, weakness EXAM: PORTABLE CHEST 1 VIEW COMPARISON:  01/14/2005 FINDINGS: Mild bibasilar linear atelectasis. Mild elevation of  right hemidiaphragm. No superimposed confluent pulmonary infiltrate. No pneumothorax or pleural effusion. Cardiac size within normal limits. Pulmonary vascularity is normal. No acute bone abnormality. IMPRESSION: 1. Mild bibasilar atelectasis. Electronically Signed   By: Helyn Numbers M.D.   On: 09/14/2023 20:39     A/P: 72 year old woman with large multiloculated hepatic abscess versus cystic mass.  Associated mild transaminitis and leukocytosis.  Possible mild diverticulitis.  I have reviewed her CT images personally.  This lesion occupies much of the central and right lobe.  Recommend broad-spectrum empiric antibiotics, fluid resuscitation and correction of electrolyte abnormalities, dedicated liver imaging to further evaluate (MRI liver currently ordered).  Surgery team will follow.    Patient Active Problem List   Diagnosis Date Noted   Diverticulitis 09/15/2023   Hepatic abscess 09/14/2023       Phylliss Blakes, MD [facility]  See AMION to contact appropriate on-call provider

## 2023-09-15 NOTE — Plan of Care (Signed)
Patient seen and rounded on this morning.  Admitted after midnight, see H&P for full details. 72 year old female admitted with weakness and abdominal discomfort.  She was also complaining of some lower back pain radiating down her right leg. CT abdomen/pelvis showed concern for hepatic abscess measuring 11 x 8.7 x 12.4 cm. General surgery was consulted and she was started on antibiotics. She was ordered to undergo MRI liver as well as MRI lumbar spine to further evaluate the concerns.  Plan: -Continue Rocephin and Flagyl - Follow-up MRI liver and lumbar spine -Follow-up blood cultures -okay to start IVF while NPO; can discuss diet advancement with surgery post MRI  Lewie Chamber, MD [facility] 09/15/2023, 3:23 PM

## 2023-09-16 ENCOUNTER — Inpatient Hospital Stay (HOSPITAL_COMMUNITY): Payer: Medicare HMO

## 2023-09-16 ENCOUNTER — Encounter (HOSPITAL_COMMUNITY): Payer: Self-pay | Admitting: Internal Medicine

## 2023-09-16 DIAGNOSIS — B955 Unspecified streptococcus as the cause of diseases classified elsewhere: Secondary | ICD-10-CM

## 2023-09-16 DIAGNOSIS — K750 Abscess of liver: Secondary | ICD-10-CM | POA: Diagnosis not present

## 2023-09-16 DIAGNOSIS — R7881 Bacteremia: Secondary | ICD-10-CM

## 2023-09-16 DIAGNOSIS — K5792 Diverticulitis of intestine, part unspecified, without perforation or abscess without bleeding: Secondary | ICD-10-CM

## 2023-09-16 DIAGNOSIS — M48061 Spinal stenosis, lumbar region without neurogenic claudication: Secondary | ICD-10-CM | POA: Diagnosis not present

## 2023-09-16 HISTORY — PX: IR GUIDED DRAIN W CATHETER PLACEMENT: IMG719

## 2023-09-16 LAB — COMPREHENSIVE METABOLIC PANEL
ALT: 156 U/L — ABNORMAL HIGH (ref 0–44)
AST: 161 U/L — ABNORMAL HIGH (ref 15–41)
Albumin: 1.9 g/dL — ABNORMAL LOW (ref 3.5–5.0)
Alkaline Phosphatase: 94 U/L (ref 38–126)
Anion gap: 6 (ref 5–15)
BUN: 24 mg/dL — ABNORMAL HIGH (ref 8–23)
CO2: 23 mmol/L (ref 22–32)
Calcium: 7.9 mg/dL — ABNORMAL LOW (ref 8.9–10.3)
Chloride: 106 mmol/L (ref 98–111)
Creatinine, Ser: 0.88 mg/dL (ref 0.44–1.00)
GFR, Estimated: >60 mL/min (ref >60)
Glucose, Bld: 132 mg/dL — ABNORMAL HIGH (ref 70–99)
Potassium: 3.2 mmol/L — ABNORMAL LOW (ref 3.5–5.1)
Sodium: 135 mmol/L (ref 135–145)
Total Bilirubin: 0.7 mg/dL (ref <1.2)
Total Protein: 5.6 g/dL — ABNORMAL LOW (ref 6.5–8.1)

## 2023-09-16 LAB — CBC WITH DIFFERENTIAL/PLATELET
Abs Immature Granulocytes: 0 10*3/uL (ref 0.00–0.07)
Basophils Absolute: 0 10*3/uL (ref 0.0–0.1)
Basophils Relative: 0 %
Eosinophils Absolute: 0 10*3/uL (ref 0.0–0.5)
Eosinophils Relative: 0 %
HCT: 31.1 % — ABNORMAL LOW (ref 36.0–46.0)
Hemoglobin: 10.6 g/dL — ABNORMAL LOW (ref 12.0–15.0)
Lymphocytes Relative: 7 %
Lymphs Abs: 1 10*3/uL (ref 0.7–4.0)
MCH: 29.4 pg (ref 26.0–34.0)
MCHC: 34.1 g/dL (ref 30.0–36.0)
MCV: 86.1 fL (ref 80.0–100.0)
Monocytes Absolute: 0.7 10*3/uL (ref 0.1–1.0)
Monocytes Relative: 5 %
Neutro Abs: 12.8 10*3/uL — ABNORMAL HIGH (ref 1.7–7.7)
Neutrophils Relative %: 88 %
Platelets: 177 10*3/uL (ref 150–400)
RBC: 3.61 MIL/uL — ABNORMAL LOW (ref 3.87–5.11)
RDW: 14.2 % (ref 11.5–15.5)
WBC: 14.5 10*3/uL — ABNORMAL HIGH (ref 4.0–10.5)
nRBC: 0 % (ref 0.0–0.2)
nRBC: 0 /100{WBCs}

## 2023-09-16 LAB — BLOOD CULTURE ID PANEL (REFLEXED) - BCID2

## 2023-09-16 LAB — CANCER ANTIGEN 19-9: CA 19-9: <2 U/mL (ref 0–35)

## 2023-09-16 LAB — CEA: CEA: 5.1 ng/mL — ABNORMAL HIGH (ref 0.0–4.7)

## 2023-09-16 LAB — PROTIME-INR
INR: 1.3 — ABNORMAL HIGH (ref 0.8–1.2)
Prothrombin Time: 16.3 s — ABNORMAL HIGH (ref 11.4–15.2)

## 2023-09-16 LAB — MAGNESIUM: Magnesium: 2.4 mg/dL (ref 1.7–2.4)

## 2023-09-16 LAB — AFP TUMOR MARKER: AFP, Serum, Tumor Marker: <1.8 ng/mL (ref 0.0–9.2)

## 2023-09-16 MED ORDER — SODIUM CHLORIDE 0.9 % IV SOLN
INTRAVENOUS | Status: AC | PRN
  Administered 2023-09-16: 250 mL via INTRAVENOUS

## 2023-09-16 MED ORDER — MIDAZOLAM HCL 2 MG/2ML IJ SOLN
INTRAMUSCULAR | Status: AC | PRN
  Administered 2023-09-16: .5 mg via INTRAVENOUS

## 2023-09-16 MED ORDER — POTASSIUM CHLORIDE CRYS ER 20 MEQ PO TBCR
40.0000 meq | EXTENDED_RELEASE_TABLET | Freq: Once | ORAL | Status: AC
  Administered 2023-09-16: 40 meq via ORAL
  Filled 2023-09-16: qty 2

## 2023-09-16 MED ORDER — FENTANYL CITRATE (PF) 100 MCG/2ML IJ SOLN
INTRAMUSCULAR | Status: AC
  Filled 2023-09-16: qty 4

## 2023-09-16 MED ORDER — MIDAZOLAM HCL 2 MG/2ML IJ SOLN
INTRAMUSCULAR | Status: AC
  Filled 2023-09-16: qty 4

## 2023-09-16 MED ORDER — MIDAZOLAM HCL 2 MG/2ML IJ SOLN
INTRAMUSCULAR | Status: AC
  Filled 2023-09-16: qty 2

## 2023-09-16 MED ORDER — LIDOCAINE HCL 1 % IJ SOLN
INTRAMUSCULAR | Status: AC
Start: 2023-09-16 — End: ?
  Filled 2023-09-16: qty 20

## 2023-09-16 MED ORDER — FENTANYL CITRATE (PF) 100 MCG/2ML IJ SOLN
INTRAMUSCULAR | Status: AC | PRN
  Administered 2023-09-16: 25 ug via INTRAVENOUS

## 2023-09-16 MED ORDER — FENTANYL CITRATE (PF) 100 MCG/2ML IJ SOLN
INTRAMUSCULAR | Status: AC
  Filled 2023-09-16: qty 2

## 2023-09-16 MED ORDER — SODIUM CHLORIDE 0.9 % IV SOLN: 2.0000 g | Freq: Once | INTRAVENOUS | Status: DC

## 2023-09-16 NOTE — Progress Notes (Signed)
PHARMACY - PHYSICIAN COMMUNICATION CRITICAL VALUE ALERT - BLOOD CULTURE IDENTIFICATION (BCID)  Assessment:  Ashley Galloway is an 72 y.o. female who presented to [facility] on 09/14/2023 with a chief complaint of abdominal pain, possible diverticulitis. Patient was also c/o lower back pain. CT A/P with concern for hepatic abscess. 12/16 Bcx: 1/3 bottles GPCs in pairs and chains. BCID with streptococcus spp.   Name of physician (or Provider) Contacted: Herma Carson, MD   Current antibiotics: ceftriaxone 2g IV Q24H + Metronidazole 500 mg IV Q12H   Changes to prescribed antibiotics recommended:  Patient is on recommended antibiotics - No changes needed  No results found for this or any previous visit.  Jani Gravel, PharmD Clinical Pharmacist  09/16/2023 7:02 AM

## 2023-09-16 NOTE — Assessment & Plan Note (Addendum)
-   suspect the strep bacteremia is translocated from hepatic abscess; both growing strep intermedius - growing Strep intermedius in 2/2 bottles from 12/16 - 12/17 and 12/18 remain negative  -TTE recommended per ID, unfortunately no echo avail over weekend; so as to not delay discharge, discussed with ID and okay for outpatient echo

## 2023-09-16 NOTE — Procedures (Signed)
Interventional Radiology Procedure:   Indications: Hepatic abscess  Procedure: Imaged guided hepatic abscess drain placement  Findings: Large multi-loculated hepatic abscess.  Placed 10 Fr drain into largest collection and aspirated 280 ml of purulent fluid.    Complications: None     EBL: Minimal  Plan: Send fluid for culture.  Follow drain output.    Myrtha Tonkovich R. Lowella Dandy, MD  Pager: [phone]

## 2023-09-16 NOTE — Progress Notes (Signed)
Progress Note     Subjective: Abdominal pain significantly improved. Flank pain improved as well. Has been NPO. No n/v   Objective: Vital signs in last 24 hours: Temp:  [98.3 F (36.8 C)-100.5 F (38.1 C)] 98.7 F (37.1 C) (12/18 0801) Pulse Rate:  [67-79] 79 (12/18 0801) Resp:  [16-20] 16 (12/18 0801) BP: (90-112)/(39-79) 95/79 (12/18 0801) SpO2:  [98 %-100 %] 100 % (12/18 0801) Last BM Date : 09/14/23  Intake/Output from previous day: 12/17 0701 - 12/18 0700 In: 581.8 [I.V.:481.8; IV Piggyback:100] Out: -  Intake/Output this shift: No intake/output data recorded.  PE: General: pleasant, WD, female who is laying in bed in NAD Lungs:Respiratory effort nonlabored Abd: soft, ND, +BS. Mild TTP RUQ MSK: all 4 extremities are symmetrical with no cyanosis, clubbing, or edema. Skin: warm and dry  Psych: A&Ox3 with an appropriate affect.    Lab Results:  Recent Labs    09/15/23 0621 09/16/23 0653  WBC 12.1* 14.5*  HGB 10.5* 10.6*  HCT 30.5* 31.1*  PLT 169 177   BMET Recent Labs    09/15/23 0621 09/16/23 0653  NA 133* 135  K 3.3* 3.2*  CL 102 106  CO2 20* 23  GLUCOSE 116* 132*  BUN 29* 24*  CREATININE 0.96 0.88  CALCIUM 7.8* 7.9*   PT/INR No results for input(s): "LABPROT", "INR" in the last 72 hours. CMP     Component Value Date/Time   NA 135 09/16/2023 0653   K 3.2 (L) 09/16/2023 0653   CL 106 09/16/2023 0653   CO2 23 09/16/2023 0653   GLUCOSE 132 (H) 09/16/2023 0653   BUN 24 (H) 09/16/2023 0653   CREATININE 0.88 09/16/2023 0653   CALCIUM 7.9 (L) 09/16/2023 0653   PROT 5.6 (L) 09/16/2023 0653   ALBUMIN 1.9 (L) 09/16/2023 0653   AST 161 (H) 09/16/2023 0653   ALT 156 (H) 09/16/2023 0653   ALKPHOS 94 09/16/2023 0653   BILITOT 0.7 09/16/2023 0653   GFRNONAA >60 09/16/2023 0653   Lipase     Component Value Date/Time   LIPASE 24 09/14/2023 1638       Studies/Results: MR LIVER W WO CONTRAST Result Date: 09/16/2023 CLINICAL DATA:   Evaluate for liver abscess versus tumor. EXAM: MRI ABDOMEN WITHOUT AND WITH CONTRAST TECHNIQUE: Multiplanar multisequence MR imaging of the abdomen was performed both before and after the administration of intravenous contrast. CONTRAST:  8mL GADAVIST GADOBUTROL 1 MMOL/ML IV SOLN COMPARISON:  CT AP from 09/14/2023 FINDINGS: Exam detail is limited secondary to patient body habitus and motion artifact. Lower chest: No acute findings. Hepatobiliary: There is a large, complex, extensively septated mass centered around segment 8 of the liver and extending into segments 4, 5 and 7. This is a new finding when compared with CT from 03/27/2023. The mass is predominantly T2 hyperintense with extensive T1 isointense irregular internal septations. This demonstrates high signal on the diffusion-weighted images and decreased signal on the ADC mapping. This measures 11.9 x 7.3 by 12.5 cm. On the postcontrast images there is diffuse enhancement of the internal areas of septation. Previous cholecystectomy. Chronic increased caliber of the common bile duct measuring 1.2 cm with mild intrahepatic bile duct dilatation., unchanged from 02/28/2021. No sign of choledocholithiasis Pancreas: No mass, inflammatory changes, or other parenchymal abnormality identified. Spleen:  Within normal limits in size and appearance. Adrenals/Urinary Tract: Normal adrenal glands. Simple appearing cyst within the upper pole of right kidney is uniformly T2 hyperintense measuring 1.8 cm. No signs of hydronephrosis  identified bilaterally. Stomach/Bowel: Stomach is nondistended. No pathologic dilatation of the visualized bowel loops. Colonic diverticulosis. Sub optimal visualization of recently demonstrated diverticular changes involving the sigmoid colon. Vascular/Lymphatic: No aneurysm visualized. Atherosclerotic changes involving the abdominal aorta. No adenopathy. Other:  No significant ascites. Musculoskeletal: No suspicious bone lesions identified.  IMPRESSION: 1. There is a large, complex, extensively septated mass centered around segment 8 of the liver and extending into segments 4, 5 and 7. This is a new finding when compared with CT from 03/27/2023. This is favored to represent a large hepatic abscess. Correlate for any clinical signs or symptoms of infection. A neoplastic process is considered less likely. 2. Previous cholecystectomy. Chronic increased caliber of the common bile duct measuring 1.2 cm with mild intrahepatic bile duct dilatation, unchanged from 02/28/2021. No sign of choledocholithiasis. 3. Colonic diverticulosis. Sub optimal visualization of recently demonstrated diverticular changes involving the sigmoid colon. Electronically Signed   By: Signa Kell M.D.   On: 09/16/2023 06:29   MR LUMBAR SPINE WO CONTRAST Result Date: 09/16/2023 CLINICAL DATA:  Low back pain EXAM: MRI LUMBAR SPINE WITHOUT CONTRAST TECHNIQUE: Multiplanar, multisequence MR imaging of the lumbar spine was performed. No intravenous contrast was administered. COMPARISON:  04/18/2017 FINDINGS: Segmentation:  Standard. Alignment:  Physiologic. Vertebrae:  No fracture, evidence of discitis, or bone lesion. Conus medullaris and cauda equina: Conus extends to the L1 level. Conus and cauda equina appear normal. Paraspinal and other soft tissues: Incompletely visualized multi-septated collection within the liver. Disc levels: L1-L2: Normal disc space and facet joints. No spinal canal stenosis. No neural foraminal stenosis. L2-L3: Small disc bulge. Mild spinal canal stenosis. No neural foraminal stenosis. L3-L4: Small disc bulge. No spinal canal stenosis. Unchanged moderate bilateral neural foraminal stenosis. L4-L5: Small disc bulge with endplate spurring and mild facet hypertrophy. No spinal canal stenosis. Unchanged severe bilateral neural foraminal stenosis. L5-S1: Moderate facet hypertrophy and small right asymmetric disc bulge. No spinal canal stenosis. Unchanged  moderate right neural foraminal stenosis. Visualized sacrum: Normal. IMPRESSION: 1. Unchanged severe bilateral L4-L5, moderate bilateral L3-L4 and right L5-S1 neural foraminal stenosis. 2. Mild L2-L3 spinal canal stenosis. 3. Incompletely visualized multi-septated collection within the liver. Electronically Signed   By: Deatra Robinson M.D.   On: 09/16/2023 02:36   CT ABDOMEN PELVIS W CONTRAST Result Date: 09/14/2023 CLINICAL DATA:  Epigastric abdominal pain EXAM: CT ABDOMEN AND PELVIS WITH CONTRAST TECHNIQUE: Multidetector CT imaging of the abdomen and pelvis was performed using the standard protocol following bolus administration of intravenous contrast. RADIATION DOSE REDUCTION: This exam was performed according to the departmental dose-optimization program which includes automated exposure control, adjustment of the mA and/or kV according to patient size and/or use of iterative reconstruction technique. CONTRAST:  85mL OMNIPAQUE IOHEXOL 300 MG/ML  SOLN COMPARISON:  02/28/2021 FINDINGS: Lower chest: No acute abnormality. Hepatobiliary: Status post cholecystectomy. A multiloculated, heavily septated collection has developed within the central aspects of segment 4-8 extending to the biliary hilum most in keeping with a developing hepatic abscess measuring 11.0 x 8.7 x 12.4 cm in greatest dimension. Less likely, this may represent a mixed solid cystic mass. Mild extrahepatic biliary ductal dilation is stable, likely representing post cholecystectomy change. No intrahepatic biliary ductal dilation. Tiny cyst again noted within the hepatic dome. Pancreas: Unremarkable Spleen: Unremarkable Adrenals/Urinary Tract: The adrenal glands are unremarkable. The kidneys are normal in size and position. Vascular calcification noted within the renal hila bilaterally. Simple cortical cyst noted within the interpolar region of the right kidney for which  no follow-up imaging is recommended. The kidneys are otherwise unremarkable.  Bladder is decompressed. Punctate foci of gas within the bladder lumen nonspecific may relate to recent catheterization. Stomach/Bowel: There is severe descending and sigmoid colonic diverticulosis. There is superimposed mild pericolonic inflammatory stranding and mural thickening involving the proximal sigmoid colon suggestive of a very mild acute or subacute diverticulitis. There is no evidence of obstruction or perforation. No free intraperitoneal gas or fluid. The stomach, small bowel, and large bowel are otherwise unremarkable. Appendix absent. Vascular/Lymphatic: Extensive aortoiliac atherosclerotic calcification. No aortic aneurysm. No pathologic adenopathy within the abdomen and pelvis. Reproductive: Uterus and bilateral adnexa are unremarkable. Other: Tiny fat containing umbilical hernia. Musculoskeletal: No acute bone abnormality. Osseous structures are age-appropriate. IMPRESSION: 1. Interval development of a multiloculated, heavily septated collection within the central aspects of segment 4-8 extending to the biliary hilum most in keeping with a developing hepatic abscess measuring 11.0 x 8.7 x 12.4 cm in greatest dimension. Less likely, this may represent a mixed solid cystic mass. If blood cultures are nondiagnostic, needle sampling of the collection may be helpful to tailor antibiotic therapy though the heavily septated nature of the collection would preclude catheter drainage. 2. Severe descending and sigmoid colonic diverticulosis with superimposed mild pericolonic inflammatory stranding and mural thickening involving the proximal sigmoid colon suggestive of a very mild acute or subacute diverticulitis. No evidence of obstruction or perforation. 3. Punctate foci of gas within the bladder lumen nonspecific may relate to recent catheterization. Aortic Atherosclerosis (ICD10-I70.0). Electronically Signed   By: Helyn Numbers M.D.   On: 09/14/2023 20:50   DG Chest Port 1 View Result Date:  09/14/2023 CLINICAL DATA:  Abdominal pain, bilateral flank pain, weakness EXAM: PORTABLE CHEST 1 VIEW COMPARISON:  01/14/2005 FINDINGS: Mild bibasilar linear atelectasis. Mild elevation of right hemidiaphragm. No superimposed confluent pulmonary infiltrate. No pneumothorax or pleural effusion. Cardiac size within normal limits. Pulmonary vascularity is normal. No acute bone abnormality. IMPRESSION: 1. Mild bibasilar atelectasis. Electronically Signed   By: Helyn Numbers M.D.   On: 09/14/2023 20:39    Anti-infectives: Anti-infectives (From admission, onward)    Start     Dose/Rate Route Frequency Ordered Stop   09/15/23 2200  cefTRIAXone (ROCEPHIN) 2 g in sodium chloride 0.9 % 100 mL IVPB        2 g 200 mL/hr over 30 Minutes Intravenous Every 24 hours 09/15/23 0122     09/15/23 1000  metroNIDAZOLE (FLAGYL) IVPB 500 mg        500 mg 100 mL/hr over 60 Minutes Intravenous Every 12 hours 09/15/23 0122     09/14/23 2115  cefTRIAXone (ROCEPHIN) 2 g in sodium chloride 0.9 % 100 mL IVPB        2 g 200 mL/hr over 30 Minutes Intravenous  Once 09/14/23 2113 09/14/23 2230   09/14/23 2115  metroNIDAZOLE (FLAGYL) IVPB 500 mg        500 mg 100 mL/hr over 60 Minutes Intravenous  Once 09/14/23 2113 09/14/23 2342        Assessment/Plan Multiloculated hepatic abscess  - MR abdomen 12/17 with large complex extensively septated mass - mild diverticulitis on CT 12/16 - blood cultures pending with strep on ID panel  - recommend IR evaluation for drainage of abscess  FEN: NPO for IR eval ID: [email or web address] VTE: lovenox  I reviewed hospitalist notes, last 24 h vitals and pain scores, last 48 h intake and output, last 24 h labs and trends, and last 24 h imaging results.  LOS: 1 day   Eric Form, [facility] Surgery 09/16/2023, 8:17 AM Please see Amion for pager number during day hours 7:00am-4:30pm

## 2023-09-16 NOTE — H&P (Addendum)
Chief Complaint: Patient was seen in consultation today for image guided liver abscess drain placement.   at the request of Carl Best, PA-C  Referring Physician(s): Carl Best PA-C  Supervising Physician: Richarda Overlie  Patient Status: [facility] - In-pt  Full Code  History of Present Illness: Ashley Galloway is a 72 y.o. 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  Patient presented to the emergency room 09/14/23 with c/o LE weakness, vomiting and poor oral intake. In the ER, patient had an elevated WBC count, transaminitis, and fever. CT abdomen/pelvis revealed a large septated collection in the biliary hilum and severe descending and sigmoid colon diverticulosis with pericolonic stranding and mural thickening. Patient was admitted to the hospital.   Today, patient reports that for the last week she experienced lethargy and a poor appetite. This started after attending a chair yoga class. Patient reports that she initially had abdominal pain, but this resolved. She also notes that her mouth is dry.   Past Medical History:  Diagnosis Date   Centrilobular emphysema (HCC)    Colon polyps    Looks like saw Dr. Marca Ancona in 2021   Diverticulitis    HLD (hyperlipidemia)    TIA (transient ischemic attack)    Vesicointestinal fistula     Past Surgical History:  Procedure Laterality Date   CHOLECYSTECTOMY     PARTIAL COLECTOMY     Tubulovillous adenoma   SPINE SURGERY      Allergies: Tape  Medications: Prior to Admission medications   Medication Sig Start Date End Date Taking? Authorizing Provider  aspirin EC 81 MG tablet Take 81 mg by mouth daily. Swallow whole.   Yes [provider]  KLOR-CON M20 20 MEQ tablet Take 20 mEq by mouth daily.   Yes [provider]  latanoprost (XALATAN) 0.005 % ophthalmic solution Place 1 drop into both eyes at  bedtime.   Yes [provider]  lovastatin (MEVACOR) 20 MG tablet Take 20 mg by mouth daily.   Yes [provider]  Multiple Vitamins-Minerals (CENTRUM SILVER WOMEN 50+) TABS Take 1 tablet by mouth daily.   Yes [provider]  timolol (TIMOPTIC) 0.5 % ophthalmic solution Place 1 drop into both eyes 2 (two) times daily. 07/08/23  Yes [provider]  traZODone (DESYREL) 50 MG tablet Take 1-2 tablets by mouth at bedtime. 04/23/23  Yes [provider]  triamterene-hydrochlorothiazide (DYAZIDE) 37.5-25 MG capsule Take 1 capsule by mouth every morning.   Yes [provider]     Family History  Problem Relation Age of Onset   Breast cancer Neg Hx     Social History   Socioeconomic History   Marital status: Married    Spouse name: Not on file   Number of children: Not on file   Years of education: Not on file   Highest education level: Not on file  Occupational History   Not on file  Tobacco Use   Smoking status: Every Day    Types: Cigarettes    Passive exposure: Current   Smokeless tobacco: Never  Substance and Sexual Activity   Alcohol use: Not on file   Drug use: Not on file   Sexual activity: Not on file  Other Topics Concern   Not on file  Social History Narrative   Not on file   Social Drivers of Health   Financial Resource Strain: Not on file  Food Insecurity: No Food  Insecurity (09/15/2023)   Hunger Vital Sign    Worried About Running Out of Food in the Last Year: Never true    Ran Out of Food in the Last Year: Never true  Transportation Needs: No Transportation Needs (09/15/2023)   PRAPARE - Administrator, Civil Service (Medical): No    Lack of Transportation (Non-Medical): No  Physical Activity: Not on file  Stress: Not on file  Social Connections: Not on file      Review of Systems: A 12 point ROS discussed and pertinent positives are indicated in the HPI above.  All other systems are  negative.  Review of Systems  Constitutional:  Positive for appetite change and fatigue.  HENT:         Positive for: xerostomia  Respiratory:  Negative for shortness of breath.   Cardiovascular:  Negative for chest pain.  Gastrointestinal:  Negative for abdominal pain.  Neurological:  Positive for weakness.  Psychiatric/Behavioral:  Negative for confusion.     Vital Signs: BP 95/79 (BP Location: Right Arm)   Pulse 79   Temp 98.7 F (37.1 C) (Oral)   Resp 16   Ht 5\' 7"  (1.702 m)   Wt 186 lb 1.1 oz (84.4 kg)   SpO2 100%   BMI 29.14 kg/m   Advance Care Plan: The advanced care plan/surrogate decision maker was discussed at the time of visit and documented in the medical record.    Physical Exam HENT:     Head: Normocephalic and atraumatic.     Mouth/Throat:     Mouth: Mucous membranes are dry.  Cardiovascular:     Rate and Rhythm: Normal rate and regular rhythm.  Pulmonary:     Effort: Pulmonary effort is normal. No respiratory distress.  Abdominal:     Palpations: Abdomen is soft.     Tenderness: There is no abdominal tenderness.  Skin:    General: Skin is warm.  Neurological:     Mental Status: She is alert and oriented to person, place, and time.  Psychiatric:        Thought Content: Thought content normal.     Imaging: MR LIVER W WO CONTRAST Result Date: 09/16/2023 CLINICAL DATA:  Evaluate for liver abscess versus tumor. EXAM: MRI ABDOMEN WITHOUT AND WITH CONTRAST TECHNIQUE: Multiplanar multisequence MR imaging of the abdomen was performed both before and after the administration of intravenous contrast. CONTRAST:  8mL GADAVIST GADOBUTROL 1 MMOL/ML IV SOLN COMPARISON:  CT AP from 09/14/2023 FINDINGS: Exam detail is limited secondary to patient body habitus and motion artifact. Lower chest: No acute findings. Hepatobiliary: There is a large, complex, extensively septated mass centered around segment 8 of the liver and extending into segments 4, 5 and 7. This is a  new finding when compared with CT from 03/27/2023. The mass is predominantly T2 hyperintense with extensive T1 isointense irregular internal septations. This demonstrates high signal on the diffusion-weighted images and decreased signal on the ADC mapping. This measures 11.9 x 7.3 by 12.5 cm. On the postcontrast images there is diffuse enhancement of the internal areas of septation. Previous cholecystectomy. Chronic increased caliber of the common bile duct measuring 1.2 cm with mild intrahepatic bile duct dilatation., unchanged from 02/28/2021. No sign of choledocholithiasis Pancreas: No mass, inflammatory changes, or other parenchymal abnormality identified. Spleen:  Within normal limits in size and appearance. Adrenals/Urinary Tract: Normal adrenal glands. Simple appearing cyst within the upper pole of right kidney is uniformly T2 hyperintense measuring 1.8 cm. No signs  of hydronephrosis identified bilaterally. Stomach/Bowel: Stomach is nondistended. No pathologic dilatation of the visualized bowel loops. Colonic diverticulosis. Sub optimal visualization of recently demonstrated diverticular changes involving the sigmoid colon. Vascular/Lymphatic: No aneurysm visualized. Atherosclerotic changes involving the abdominal aorta. No adenopathy. Other:  No significant ascites. Musculoskeletal: No suspicious bone lesions identified. IMPRESSION: 1. There is a large, complex, extensively septated mass centered around segment 8 of the liver and extending into segments 4, 5 and 7. This is a new finding when compared with CT from 03/27/2023. This is favored to represent a large hepatic abscess. Correlate for any clinical signs or symptoms of infection. A neoplastic process is considered less likely. 2. Previous cholecystectomy. Chronic increased caliber of the common bile duct measuring 1.2 cm with mild intrahepatic bile duct dilatation, unchanged from 02/28/2021. No sign of choledocholithiasis. 3. Colonic diverticulosis.  Sub optimal visualization of recently demonstrated diverticular changes involving the sigmoid colon. Electronically Signed   By: Signa Kell M.D.   On: 09/16/2023 06:29   MR LUMBAR SPINE WO CONTRAST Result Date: 09/16/2023 CLINICAL DATA:  Low back pain EXAM: MRI LUMBAR SPINE WITHOUT CONTRAST TECHNIQUE: Multiplanar, multisequence MR imaging of the lumbar spine was performed. No intravenous contrast was administered. COMPARISON:  04/18/2017 FINDINGS: Segmentation:  Standard. Alignment:  Physiologic. Vertebrae:  No fracture, evidence of discitis, or bone lesion. Conus medullaris and cauda equina: Conus extends to the L1 level. Conus and cauda equina appear normal. Paraspinal and other soft tissues: Incompletely visualized multi-septated collection within the liver. Disc levels: L1-L2: Normal disc space and facet joints. No spinal canal stenosis. No neural foraminal stenosis. L2-L3: Small disc bulge. Mild spinal canal stenosis. No neural foraminal stenosis. L3-L4: Small disc bulge. No spinal canal stenosis. Unchanged moderate bilateral neural foraminal stenosis. L4-L5: Small disc bulge with endplate spurring and mild facet hypertrophy. No spinal canal stenosis. Unchanged severe bilateral neural foraminal stenosis. L5-S1: Moderate facet hypertrophy and small right asymmetric disc bulge. No spinal canal stenosis. Unchanged moderate right neural foraminal stenosis. Visualized sacrum: Normal. IMPRESSION: 1. Unchanged severe bilateral L4-L5, moderate bilateral L3-L4 and right L5-S1 neural foraminal stenosis. 2. Mild L2-L3 spinal canal stenosis. 3. Incompletely visualized multi-septated collection within the liver. Electronically Signed   By: Deatra Robinson M.D.   On: 09/16/2023 02:36   CT ABDOMEN PELVIS W CONTRAST Result Date: 09/14/2023 CLINICAL DATA:  Epigastric abdominal pain EXAM: CT ABDOMEN AND PELVIS WITH CONTRAST TECHNIQUE: Multidetector CT imaging of the abdomen and pelvis was performed using the standard  protocol following bolus administration of intravenous contrast. RADIATION DOSE REDUCTION: This exam was performed according to the departmental dose-optimization program which includes automated exposure control, adjustment of the mA and/or kV according to patient size and/or use of iterative reconstruction technique. CONTRAST:  85mL OMNIPAQUE IOHEXOL 300 MG/ML  SOLN COMPARISON:  02/28/2021 FINDINGS: Lower chest: No acute abnormality. Hepatobiliary: Status post cholecystectomy. A multiloculated, heavily septated collection has developed within the central aspects of segment 4-8 extending to the biliary hilum most in keeping with a developing hepatic abscess measuring 11.0 x 8.7 x 12.4 cm in greatest dimension. Less likely, this may represent a mixed solid cystic mass. Mild extrahepatic biliary ductal dilation is stable, likely representing post cholecystectomy change. No intrahepatic biliary ductal dilation. Tiny cyst again noted within the hepatic dome. Pancreas: Unremarkable Spleen: Unremarkable Adrenals/Urinary Tract: The adrenal glands are unremarkable. The kidneys are normal in size and position. Vascular calcification noted within the renal hila bilaterally. Simple cortical cyst noted within the interpolar region of the right kidney  for which no follow-up imaging is recommended. The kidneys are otherwise unremarkable. Bladder is decompressed. Punctate foci of gas within the bladder lumen nonspecific may relate to recent catheterization. Stomach/Bowel: There is severe descending and sigmoid colonic diverticulosis. There is superimposed mild pericolonic inflammatory stranding and mural thickening involving the proximal sigmoid colon suggestive of a very mild acute or subacute diverticulitis. There is no evidence of obstruction or perforation. No free intraperitoneal gas or fluid. The stomach, small bowel, and large bowel are otherwise unremarkable. Appendix absent. Vascular/Lymphatic: Extensive aortoiliac  atherosclerotic calcification. No aortic aneurysm. No pathologic adenopathy within the abdomen and pelvis. Reproductive: Uterus and bilateral adnexa are unremarkable. Other: Tiny fat containing umbilical hernia. Musculoskeletal: No acute bone abnormality. Osseous structures are age-appropriate. IMPRESSION: 1. Interval development of a multiloculated, heavily septated collection within the central aspects of segment 4-8 extending to the biliary hilum most in keeping with a developing hepatic abscess measuring 11.0 x 8.7 x 12.4 cm in greatest dimension. Less likely, this may represent a mixed solid cystic mass. If blood cultures are nondiagnostic, needle sampling of the collection may be helpful to tailor antibiotic therapy though the heavily septated nature of the collection would preclude catheter drainage. 2. Severe descending and sigmoid colonic diverticulosis with superimposed mild pericolonic inflammatory stranding and mural thickening involving the proximal sigmoid colon suggestive of a very mild acute or subacute diverticulitis. No evidence of obstruction or perforation. 3. Punctate foci of gas within the bladder lumen nonspecific may relate to recent catheterization. Aortic Atherosclerosis (ICD10-I70.0). Electronically Signed   By: Helyn Numbers M.D.   On: 09/14/2023 20:50   DG Chest Port 1 View Result Date: 09/14/2023 CLINICAL DATA:  Abdominal pain, bilateral flank pain, weakness EXAM: PORTABLE CHEST 1 VIEW COMPARISON:  01/14/2005 FINDINGS: Mild bibasilar linear atelectasis. Mild elevation of right hemidiaphragm. No superimposed confluent pulmonary infiltrate. No pneumothorax or pleural effusion. Cardiac size within normal limits. Pulmonary vascularity is normal. No acute bone abnormality. IMPRESSION: 1. Mild bibasilar atelectasis. Electronically Signed   By: Helyn Numbers M.D.   On: 09/14/2023 20:39    Labs:  CBC: Recent Labs    09/14/23 1638 09/15/23 0621 09/16/23 0653  WBC 14.6* 12.1*  14.5*  HGB 12.7 10.5* 10.6*  HCT 36.8 30.5* 31.1*  PLT 201 169 177    COAGS: No results for input(s): "INR", "APTT" in the last 8760 hours.  BMP: Recent Labs    09/14/23 1638 09/15/23 0621 09/16/23 0653  NA 131* 133* 135  K 3.5 3.3* 3.2*  CL 96* 102 106  CO2 22 20* 23  GLUCOSE 158* 116* 132*  BUN 40* 29* 24*  CALCIUM 9.5 7.8* 7.9*  CREATININE 1.15* 0.96 0.88  GFRNONAA 51* >60 >60    LIVER FUNCTION TESTS: Recent Labs    09/14/23 1638 09/15/23 0621 09/16/23 0653  BILITOT 1.3* 1.0 0.7  AST 224* 201* 161*  ALT 206* 171* 156*  ALKPHOS 117 91 94  PROT 7.7 5.7* 5.6*  ALBUMIN 3.7 1.9* 1.9*    TUMOR MARKERS: No results for input(s): "AFPTM", "CEA", "CA199", "CHROMGRNA" in the last 8760 hours.  Assessment and Plan:  Patient is set up for a image guided liver abscess drain placement.  Patient is currently NPO and Lovenox is on hold. Last dose was 2359 12/17. INR ordered.  Risks and benefits discussed with the patient including bleeding, sepsis, infection, damage to adjacent structures, bowel perforation/fistula connection, and sepsis.  All of the patient's questions were answered, patient is agreeable to proceed. Consent signed and  in chart.  Thank you for this interesting consult.  I greatly enjoyed meeting Ashley Galloway and look forward to participating in their care.  A copy of this report was sent to the requesting provider on this date.  Electronically Signed: Rosalita Levan, PA 09/16/2023, 2:18 PM   I spent a total of 20 Minutes    in face to face in clinical consultation, greater than 50% of which was counseling/coordinating care for image guided liver abscess drain placement.

## 2023-09-16 NOTE — Plan of Care (Signed)

## 2023-09-16 NOTE — Hospital Course (Addendum)
Ashley Galloway is a 72 year old female admitted with weakness and abdominal discomfort.  She was also complaining of some lower back pain radiating down her right leg. CT abdomen/pelvis showed concern for hepatic abscess measuring 11 x 8.7 x 12.4 cm.  Also concerning for diverticulitis involving descending and sigmoid colon. General surgery was consulted and she was started on antibiotics. MRI liver also obtained after admission showing underlying hepatic abscess as seen on CT. MRI lumbar spine showed underlying stenosis from approximately L3-S1, worse in L4-L5. She underwent drain placement with IR to the hepatic abscess on 09/16/2023.  Blood cultures and abscess drainage both grew Streptococcus intermedius.  She was evaluated by ID as well.  She was discharged with PICC line in place and 4-week course of Rocephin; 2 weeks total of flagyl.

## 2023-09-16 NOTE — Progress Notes (Signed)
Progress Note    Ashley Galloway   ZOX:[id number]  DOB: 12-Mar-1951  DOA: 09/14/2023     1 PCP: Renford Dills, MD  Initial CC: abdominal pain  Hospital Course: [name] is a 72 year old female admitted with weakness and abdominal discomfort.  She was also complaining of some lower back pain radiating down her right leg. CT abdomen/pelvis showed concern for hepatic abscess measuring 11 x 8.7 x 12.4 cm.  Also concerning for diverticulitis involving descending and sigmoid colon. General surgery was consulted and she was started on antibiotics. MRI liver also obtained after admission showing underlying hepatic abscess as seen on CT. MRI lumbar spine showed underlying stenosis from approximately L3-S1, worse in L4-L5.  Interval History:  No events overnight.  Right leg pain has actually improved and she is ambulating easier today.  Denies much abdominal pain anymore and appetite remaining strong and she is hopeful for diet advancement soon.  Assessment and Plan: * Hepatic abscess - per CT: " Interval development of a multiloculated, heavily septated collection within the central aspects of segment 4-8 extending to the biliary hilum most in keeping with a developing hepatic abscess measuring 11.0 x 8.7 x 12.4 cm in greatest dimension" - continue rocephin and flagyl - suspect the strep bacteremia is translocated from hepatic abscess as well -General Surgery following, appreciate assistance; ?IR drain -will discuss with ID prior to d/c regarding abx course length as well   Streptococcal bacteremia - suspect the strep bacteremia is translocated from hepatic abscess - 1/4 bottles from admission (although sets were drawn several hours apart) -Repeat blood cultures on 09/16/2023 and monitor for clearance -Continue antibiotics as above  Diverticulitis - mild and noted on CT A/P as well on admission - diet advancement per surgery  - on CTX/flagyl  Lumbar stenosis - Patient  was endorsing right leg neuropathic pains at times - MRI L-spine confirms underlying stenosis worse at L4-5 but involving L3-S1 -Supportive treatment for now; can have referral outpatient to neurosurgery after resolution of acute infection   Old records reviewed in assessment of this patient  Antimicrobials: Rocephin 09/14/2023 >> current Flagyl 09/14/2023 >> current  DVT prophylaxis:  enoxaparin (LOVENOX) injection 40 mg Start: 09/15/23 2200   Code Status:   Code Status: Full Code  Mobility Assessment (Last 72 Hours)     Mobility Assessment     Row Name 09/15/23 0742 09/15/23 0021         Does patient have an order for bedrest or is patient medically unstable No - Continue assessment No - Continue assessment      What is the highest level of mobility based on the progressive mobility assessment? Level 6 (Walks independently in room and hall) - Balance while walking in room without assist - Complete Level 5 (Walks with assist in room/hall) - Balance while stepping forward/back and can walk in room with assist - Complete               Barriers to discharge: none Disposition Plan:  Home Status is: Inpt  Objective: Blood pressure 95/79, pulse 79, temperature 98.7 F (37.1 C), temperature source Oral, resp. rate 16, height 5\' 7"  (1.702 m), weight 84.4 kg, SpO2 100%.  Examination:  Physical Exam Constitutional:      Appearance: Normal appearance.  HENT:     Head: Normocephalic and atraumatic.     Mouth/Throat:     Mouth: Mucous membranes are moist.  Eyes:     Extraocular Movements: Extraocular movements intact.  Cardiovascular:  Rate and Rhythm: Normal rate and regular rhythm.  Pulmonary:     Effort: Pulmonary effort is normal. No respiratory distress.     Breath sounds: Normal breath sounds. No wheezing.  Abdominal:     General: Bowel sounds are normal. There is no distension.     Palpations: Abdomen is soft.     Tenderness: There is no abdominal tenderness.   Musculoskeletal:        General: Normal range of motion.     Cervical back: Normal range of motion and neck supple.  Skin:    General: Skin is warm and dry.  Neurological:     General: No focal deficit present.     Mental Status: She is alert.  Psychiatric:        Mood and Affect: Mood normal.      Consultants:  General surgery  Procedures:    Data Reviewed: Results for orders placed or performed during the hospital encounter of 09/14/23 (from the past 24 hours)  CBC with Differential/Platelet     Status: Abnormal   Collection Time: 09/16/23  6:53 AM  Result Value Ref Range   WBC 14.5 (H) 4.0 - 10.5 K/uL   RBC 3.61 (L) 3.87 - 5.11 MIL/uL   Hemoglobin 10.6 (L) 12.0 - 15.0 g/dL   HCT 13.2 (L) 44.0 - 10.2 %   MCV 86.1 80.0 - 100.0 fL   MCH 29.4 26.0 - 34.0 pg   MCHC 34.1 30.0 - 36.0 g/dL   RDW 72.5 36.6 - 44.0 %   Platelets 177 150 - 400 K/uL   nRBC 0.0 0.0 - 0.2 %   Neutrophils Relative % 88 %   Neutro Abs 12.8 (H) 1.7 - 7.7 K/uL   Lymphocytes Relative 7 %   Lymphs Abs 1.0 0.7 - 4.0 K/uL   Monocytes Relative 5 %   Monocytes Absolute 0.7 0.1 - 1.0 K/uL   Eosinophils Relative 0 %   Eosinophils Absolute 0.0 0.0 - 0.5 K/uL   Basophils Relative 0 %   Basophils Absolute 0.0 0.0 - 0.1 K/uL   WBC Morphology See Note    RBC Morphology MORPHOLOGY UNREMARKABLE    nRBC 0 0 /100 WBC   Abs Immature Granulocytes 0.00 0.00 - 0.07 K/uL  Magnesium     Status: None   Collection Time: 09/16/23  6:53 AM  Result Value Ref Range   Magnesium 2.4 1.7 - 2.4 mg/dL  Comprehensive metabolic panel     Status: Abnormal   Collection Time: 09/16/23  6:53 AM  Result Value Ref Range   Sodium 135 135 - 145 mmol/L   Potassium 3.2 (L) 3.5 - 5.1 mmol/L   Chloride 106 98 - 111 mmol/L   CO2 23 22 - 32 mmol/L   Glucose, Bld 132 (H) 70 - 99 mg/dL   BUN 24 (H) 8 - 23 mg/dL   Creatinine, Ser 3.47 0.44 - 1.00 mg/dL   Calcium 7.9 (L) 8.9 - 10.3 mg/dL   Total Protein 5.6 (L) 6.5 - 8.1 g/dL   Albumin  1.9 (L) 3.5 - 5.0 g/dL   AST 425 (H) 15 - 41 U/L   ALT 156 (H) 0 - 44 U/L   Alkaline Phosphatase 94 38 - 126 U/L   Total Bilirubin 0.7 <1.2 mg/dL   GFR, Estimated >95 >63 mL/min   Anion gap 6 5 - 15    I have reviewed pertinent nursing notes, vitals, labs, and images as necessary. I have ordered labwork to follow up  on as indicated.  I have reviewed the last notes from staff over past 24 hours. I have discussed patient's care plan and test results with nursing staff, CM/SW, and other staff as appropriate.  Time spent: Greater than 50% of the 55 minute visit was spent in counseling/coordination of care for the patient as laid out in the A&P.   LOS: 1 day   Lewie Chamber, MD [facility] 09/16/2023, 1:18 PM

## 2023-09-17 DIAGNOSIS — B955 Unspecified streptococcus as the cause of diseases classified elsewhere: Secondary | ICD-10-CM | POA: Diagnosis not present

## 2023-09-17 DIAGNOSIS — K5792 Diverticulitis of intestine, part unspecified, without perforation or abscess without bleeding: Secondary | ICD-10-CM | POA: Diagnosis not present

## 2023-09-17 DIAGNOSIS — R7881 Bacteremia: Secondary | ICD-10-CM | POA: Diagnosis not present

## 2023-09-17 DIAGNOSIS — M48061 Spinal stenosis, lumbar region without neurogenic claudication: Secondary | ICD-10-CM | POA: Diagnosis not present

## 2023-09-17 DIAGNOSIS — K750 Abscess of liver: Secondary | ICD-10-CM | POA: Diagnosis not present

## 2023-09-17 LAB — CBC WITH DIFFERENTIAL/PLATELET
Abs Immature Granulocytes: 0.25 10*3/uL — ABNORMAL HIGH (ref 0.00–0.07)
Basophils Absolute: 0.1 10*3/uL (ref 0.0–0.1)
Basophils Relative: 1 %
Eosinophils Absolute: 0 10*3/uL (ref 0.0–0.5)
Eosinophils Relative: 0 %
HCT: 38.1 % (ref 36.0–46.0)
Hemoglobin: 13.1 g/dL (ref 12.0–15.0)
Immature Granulocytes: 2 %
Lymphocytes Relative: 7 %
Lymphs Abs: 1.1 10*3/uL (ref 0.7–4.0)
MCH: 29.4 pg (ref 26.0–34.0)
MCHC: 34.4 g/dL (ref 30.0–36.0)
MCV: 85.6 fL (ref 80.0–100.0)
Monocytes Absolute: 0.7 10*3/uL (ref 0.1–1.0)
Monocytes Relative: 4 %
Neutro Abs: 13.7 10*3/uL — ABNORMAL HIGH (ref 1.7–7.7)
Neutrophils Relative %: 86 %
Platelets: 205 10*3/uL (ref 150–400)
RBC: 4.45 MIL/uL (ref 3.87–5.11)
RDW: 14.3 % (ref 11.5–15.5)
Smear Review: NORMAL
WBC: 15.9 10*3/uL — ABNORMAL HIGH (ref 4.0–10.5)
nRBC: 0.1 % (ref 0.0–0.2)

## 2023-09-17 LAB — COMPREHENSIVE METABOLIC PANEL
ALT: 112 U/L — ABNORMAL HIGH (ref 0–44)
AST: 79 U/L — ABNORMAL HIGH (ref 15–41)
Albumin: 2 g/dL — ABNORMAL LOW (ref 3.5–5.0)
Alkaline Phosphatase: 93 U/L (ref 38–126)
Anion gap: 7 (ref 5–15)
BUN: 25 mg/dL — ABNORMAL HIGH (ref 8–23)
CO2: 21 mmol/L — ABNORMAL LOW (ref 22–32)
Calcium: 8.1 mg/dL — ABNORMAL LOW (ref 8.9–10.3)
Chloride: 107 mmol/L (ref 98–111)
Creatinine, Ser: 0.93 mg/dL (ref 0.44–1.00)
GFR, Estimated: >60 mL/min (ref >60)
Glucose, Bld: 128 mg/dL — ABNORMAL HIGH (ref 70–99)
Potassium: 3.6 mmol/L (ref 3.5–5.1)
Sodium: 135 mmol/L (ref 135–145)
Total Bilirubin: 0.7 mg/dL (ref <1.2)
Total Protein: 6.1 g/dL — ABNORMAL LOW (ref 6.5–8.1)

## 2023-09-17 LAB — MAGNESIUM: Magnesium: 2.4 mg/dL (ref 1.7–2.4)

## 2023-09-17 MED ORDER — SODIUM CHLORIDE 0.9% FLUSH
5.0000 mL | Freq: Three times a day (TID) | INTRAVENOUS | Status: DC
  Administered 2023-09-17 – 2023-09-19 (×8): 5 mL

## 2023-09-17 MED ORDER — BOOST / RESOURCE BREEZE PO LIQD CUSTOM
1.0000 | Freq: Three times a day (TID) | ORAL | Status: DC
  Administered 2023-09-17: 1 via ORAL

## 2023-09-17 MED ORDER — ENSURE ENLIVE PO LIQD: 237.0000 mL | Freq: Two times a day (BID) | ORAL | Status: DC

## 2023-09-17 NOTE — Progress Notes (Addendum)
Initial Nutrition Assessment  DOCUMENTATION CODES:   Not applicable  INTERVENTION:   - Diet advancement per MD - Ensure Enlive po BID, each supplement provides 350 kcal and 20 grams of protein. - Provided diverticulitis diet education and provided high fiber and low fiber handouts.   NUTRITION DIAGNOSIS:   Inadequate oral intake related to acute illness as evidenced by per patient/family report, energy intake < 75% for > 7 days.   GOAL:   Patient will meet greater than or equal to 90% of their needs   MONITOR:   PO intake, Diet advancement  REASON FOR ASSESSMENT:   Consult, Malnutrition Screening Tool Diet education  ASSESSMENT:  72 y.o F with PMH of diverticulitis, TIA, HLD, emphysema, colon resection, cholecystectomy, and vesicointestinal fistula. Presented with weakness, vomiting, and poor PO intake, found to have hepatic abscess.  12/16 - CT, mild diverticulitis, NPO 12/17  -MR abdomen, large complex extensively septated mass  12/18 - hepatic abscess drain placement  12/19 - Clear liquid diet, thins -> soft diet, thin liquids   Pt tolerated clear liquid diet and advanced to a soft diet. No N/V or abdominal pain noted.   Consulted per pt's request for diverticulitis nutrition education. Pt has no concerns for weight loss, reports her usual body weight to be 180 lbs. Within the last week prior to admission pt endorses poor PO intake, abdominal pain, and vomiting. She stated she "could not even keep water down."  Before her acute illness she endorsed good appetite and PO intake. She did not eat breakfast and would usually eat canned tuna with a pickle and saltines for lunch and have a [place] burger patty with rice for dinner. She reports she would avoid high fiber foods.  Discussed with pt that new evidence suggests that a high fiber diet is still overall recommended to reduce risk of diverticulosis as well as Diverticulitis. Provided examples and sample meal plans to  increase fiber in the diet.  Also noted that There is no scientific data to support that you need to avoid nuts, seeds, or popcorn. Eat as tolerated.  Discussed with pt that if she is experiencing a diverticulitis flare up with symptoms such as nausea, vomiting, diarrhea, a low fiber diet can help alleviate GI symptoms. Provided examples and tips to reduce discomfort during this time such as, eating small frequent meals, avoiding greasy, fried, sugary foods, caffeine and high amounts of lactose.   Admit weight: 84.4 kg  Current weight: 84.4 kg    Drains/Lines: Right hepatic abscess drain 300 ml x 24 hrs   Average Meal Intake: 12/17-12/17: 0% intake x 2 recorded meals  Nutritionally Relevant Medications: Scheduled Meds:  enoxaparin (LOVENOX) injection  40 mg Subcutaneous Q24H   feeding supplement  237 mL Oral BID BM   sodium chloride flush  5 mL Intracatheter Q8H   Continuous Infusions:  cefTRIAXone (ROCEPHIN)  IV Stopped (09/16/23 1844)   metronidazole 500 mg (09/17/23 1055)    Labs Reviewed: BUN 25, Calcium 8.1 CBG ranges from 108 mg/dL over the last 24 hours  NUTRITION - FOCUSED PHYSICAL EXAM:  Flowsheet Row Most Recent Value  Orbital Region No depletion  Upper Arm Region No depletion  Thoracic and Lumbar Region No depletion  Buccal Region No depletion  Temple Region Mild depletion  Clavicle Bone Region No depletion  Clavicle and Acromion Bone Region No depletion  Scapular Bone Region No depletion  Dorsal Hand No depletion  Patellar Region No depletion  Anterior Thigh Region No depletion  Posterior Calf Region No depletion  Edema (RD Assessment) None  Hair Reviewed  Eyes Reviewed  Mouth Reviewed  [Green dots on lips]  Skin Reviewed  Nails Reviewed       Diet Order:   Diet Order             DIET SOFT Room service appropriate? Yes; Fluid consistency: Thin  Diet effective now                   EDUCATION NEEDS:   Education needs have been  addressed  Skin:  Skin Assessment: Reviewed RN Assessment  Last BM:  12/16  Height:   Ht Readings from Last 1 Encounters:  09/15/23 5\' 7"  (1.702 m)    Weight:   Wt Readings from Last 1 Encounters:  09/15/23 84.4 kg    Ideal Body Weight:  61.4 kg  BMI:  Body mass index is 29.14 kg/m.  Estimated Nutritional Needs:   Kcal:  1500-1700  Protein:  70-90 gm  Fluid:  >1.5L   Elliot Dally, RD Registered Dietitian  See Amion for more information

## 2023-09-17 NOTE — Plan of Care (Signed)

## 2023-09-17 NOTE — Progress Notes (Signed)
   09/17/23 1523  Mobility  Activity Ambulated with assistance in hallway  Level of Assistance Contact guard assist, steadying assist  Assistive Device Other (Comment) (IV Pole)  Distance Ambulated (ft) 75 ft  Activity Response Tolerated well  Mobility Referral Yes  Mobility visit 1 Mobility  Mobility Specialist Start Time (ACUTE ONLY) 1512  Mobility Specialist Stop Time (ACUTE ONLY) 1523  Mobility Specialist Time Calculation (min) (ACUTE ONLY) 11 min   Mobility Specialist: Progress Note  Pt agreeable to mobility session - received in bed. Required CG using IV Pole with gait drifting L to R. Pt was asymptomatic throughout session with no complaints. Returned to EOB with all needs met - call bell within reach. Husband present.  Barnie Mort, BS Mobility Specialist Please contact via SecureChat or Rehab office at [phone].

## 2023-09-17 NOTE — Progress Notes (Signed)
      Chief Complaint/Subjective: Feeling much better  Objective: Vital signs in last 24 hours: Temp:  [98.4 F (36.9 C)-99 F (37.2 C)] 98.4 F (36.9 C) (12/19 0829) Pulse Rate:  [65-71] 69 (12/19 0829) Resp:  [16-33] 20 (12/19 0829) BP: (97-120)/(47-92) 120/92 (12/19 0829) SpO2:  [97 %-100 %] 100 % (12/19 0829) Last BM Date : 09/14/23 Intake/Output from previous day: 12/18 0701 - 12/19 0700 In: 200 [IV Piggyback:200] Out: 300 [Drains:300]  PE: Gen: NAd Resp: nonlabored Card: RRR Abd: soft, NT, drain with serosanguinous output  Lab Results:  Recent Labs    09/16/23 0653 09/17/23 0643  WBC 14.5* 15.9*  HGB 10.6* 13.1  HCT 31.1* 38.1  PLT 177 205   Recent Labs    09/16/23 0653 09/17/23 0643  NA 135 135  K 3.2* 3.6  CL 106 107  CO2 23 21*  GLUCOSE 132* 128*  BUN 24* 25*  CREATININE 0.88 0.93  CALCIUM 7.9* 8.1*   Recent Labs    09/16/23 1526  LABPROT 16.3*  INR 1.3*      Component Value Date/Time   NA 135 09/17/2023 0643   K 3.6 09/17/2023 0643   CL 107 09/17/2023 0643   CO2 21 (L) 09/17/2023 0643   GLUCOSE 128 (H) 09/17/2023 0643   BUN 25 (H) 09/17/2023 0643   CREATININE 0.93 09/17/2023 0643   CALCIUM 8.1 (L) 09/17/2023 0643   PROT 6.1 (L) 09/17/2023 0643   ALBUMIN 2.0 (L) 09/17/2023 0643   AST 79 (H) 09/17/2023 0643   ALT 112 (H) 09/17/2023 0643   ALKPHOS 93 09/17/2023 0643   BILITOT 0.7 09/17/2023 0643   GFRNONAA >60 09/17/2023 0643    Assessment/Plan Hepatic abscess s/p drain, diverticulitis FEN - soft diet VTE - lovenox 40 mg daily ID - flagyl, ceftriaxone, cultures pending Disposition - inpatient, no plans for surgery, needs help on what antibiotic course will be   LOS: 2 days   I reviewed last 24 h vitals and pain scores, last 48 h intake and output, last 24 h labs and trends, and last 24 h imaging results.  This care required high  level of medical decision making.   De Blanch [facility] 09/17/2023, 10:04 AM Please see Amion for pager number during day hours 7:00am-4:30pm or 7:00am -11:30am on weekends

## 2023-09-17 NOTE — Progress Notes (Signed)
Chief Complaint: Patient was seen today for hepatic abscess drainage  Supervising Physician: Marliss Coots  Patient Status: [facility] - In-pt  Subjective: S/p perc hepatic abscess drain yesterday Feels so much better today Decreased pain, some soreness at drain insertion site.  Objective: Physical Exam: BP (!) 120/92 (BP Location: Left Arm)   Pulse 69   Temp 98.4 F (36.9 C) (Oral)   Resp 20   Ht 5\' 7"  (1.702 m)   Wt 186 lb 1.1 oz (84.4 kg)   SpO2 100%   BMI 29.14 kg/m  RUQ drain intact, site clean. Output more serosanguinous today.   Current Facility-Administered Medications:    acetaminophen (TYLENOL) tablet 650 mg, 650 mg, Oral, Q6H PRN, 650 mg at 09/15/23 0233 **OR** acetaminophen (TYLENOL) suppository 650 mg, 650 mg, Rectal, Q6H PRN, Julian Reil, Jared M, DO   cefTRIAXone (ROCEPHIN) 2 g in sodium chloride 0.9 % 100 mL IVPB, 2 g, Intravenous, Q24H, Julian Reil, Jared M, DO, Stopped at 09/16/23 1844   enoxaparin (LOVENOX) injection 40 mg, 40 mg, Subcutaneous, Q24H, Julian Reil, Jared M, DO, 40 mg at 09/16/23 2323   feeding supplement (BOOST / RESOURCE BREEZE) liquid 1 Container, 1 Container, Oral, TID BM, Lewie Chamber, MD   metroNIDAZOLE (FLAGYL) IVPB 500 mg, 500 mg, Intravenous, Q12H, Julian Reil, Jared M, DO, Last Rate: 100 mL/hr at 09/16/23 2323, 500 mg at 09/16/23 2323   morphine (PF) 2 MG/ML injection 2-4 mg, 2-4 mg, Intravenous, Q4H PRN, Hillary Bow, DO, 2 mg at 09/16/23 2041   ondansetron (ZOFRAN) tablet 4 mg, 4 mg, Oral, Q6H PRN **OR** ondansetron (ZOFRAN) injection 4 mg, 4 mg, Intravenous, Q6H PRN, Julian Reil, Jared M, DO   sodium chloride flush (NS) 0.9 % injection 5 mL, 5 mL, Intracatheter, Q8H, Richarda Overlie, MD  Labs: CBC Recent Labs    09/16/23 0653 09/17/23 0643  WBC 14.5* 15.9*  HGB 10.6* 13.1  HCT 31.1* 38.1  PLT 177 205   BMET Recent Labs    09/16/23 0653 09/17/23 0643  NA 135 135  K 3.2* 3.6  CL 106 107  CO2 23 21*  GLUCOSE 132* 128*  BUN 24* 25*   CREATININE 0.88 0.93  CALCIUM 7.9* 8.1*   LFT Recent Labs    09/14/23 1638 09/15/23 0621 09/17/23 0643  PROT 7.7   < > 6.1*  ALBUMIN 3.7   < > 2.0*  AST 224*   < > 79*  ALT 206*   < > 112*  ALKPHOS 117   < > 93  BILITOT 1.3*   < > 0.7  LIPASE 24  --   --    < > = values in this interval not displayed.   PT/INR Recent Labs    09/16/23 1526  LABPROT 16.3*  INR 1.3*     Studies/Results: IR Guided Drain W Catheter Placement Result Date: 09/16/2023 INDICATION: 72 year old with a large complex hepatic abscess. Plan for image guided percutaneous drainage. EXAM: IMAGE GUIDED PLACEMENT OF HEPATIC ABSCESS DRAIN MEDICATIONS: Moderate sedation ANESTHESIA/SEDATION: Moderate (conscious) sedation was employed during this procedure. A total of Versed 0.5mg  and fentanyl 25 mcg was administered intravenously at the order of the provider performing the procedure. Total intra-service moderate sedation time: 18 minutes. Patient's level of consciousness and vital signs were monitored continuously by radiology nurse throughout the procedure under the supervision of the provider performing the procedure. COMPLICATIONS: None immediate. PROCEDURE: Informed written consent was obtained from the patient after a thorough discussion of the procedural risks, benefits and alternatives. All  questions were addressed. Maximal Sterile Barrier Technique was utilized including caps, mask, sterile gowns, sterile gloves, sterile drape, hand hygiene and skin antiseptic. A timeout was performed prior to the initiation of the procedure. Patient was placed supine on the interventional table. Liver was identified with ultrasound. The right side of the abdomen was prepped and draped in sterile fashion. Skin was anesthetized using 1% lidocaine. Small incision was made. Using ultrasound guidance, 18 gauge trocar needle was directed into the largest hepatic abscess pocket. Purulent fluid was aspirated. Superstiff Amplatz wire was  placed. The tract was dilated to accommodate a 10 [place] multipurpose drain. 280 mL of purulent fluid was removed. Drain was attached to a suction bulb and sutured in place. Dressing was placed. Fluoroscopic and ultrasound images were taken and saved for documentation. FINDINGS: Large complex multi loculated hepatic abscess. The largest pocket in the right hepatic lobe was targeted. Drain was placed within this large pocket. 280 mL of purulent fluid was removed. The hepatic abscess was significantly decompressed at the end of the procedure. IMPRESSION: Image guided placement of a hepatic abscess drain. 280 mL of purulent fluid was removed. Fluid was sent for culture. Electronically Signed   By: Richarda Overlie M.D.   On: 09/16/2023 17:36   MR LIVER W WO CONTRAST Result Date: 09/16/2023 CLINICAL DATA:  Evaluate for liver abscess versus tumor. EXAM: MRI ABDOMEN WITHOUT AND WITH CONTRAST TECHNIQUE: Multiplanar multisequence MR imaging of the abdomen was performed both before and after the administration of intravenous contrast. CONTRAST:  8mL GADAVIST GADOBUTROL 1 MMOL/ML IV SOLN COMPARISON:  CT AP from 09/14/2023 FINDINGS: Exam detail is limited secondary to patient body habitus and motion artifact. Lower chest: No acute findings. Hepatobiliary: There is a large, complex, extensively septated mass centered around segment 8 of the liver and extending into segments 4, 5 and 7. This is a new finding when compared with CT from 03/27/2023. The mass is predominantly T2 hyperintense with extensive T1 isointense irregular internal septations. This demonstrates high signal on the diffusion-weighted images and decreased signal on the ADC mapping. This measures 11.9 x 7.3 by 12.5 cm. On the postcontrast images there is diffuse enhancement of the internal areas of septation. Previous cholecystectomy. Chronic increased caliber of the common bile duct measuring 1.2 cm with mild intrahepatic bile duct dilatation., unchanged from  02/28/2021. No sign of choledocholithiasis Pancreas: No mass, inflammatory changes, or other parenchymal abnormality identified. Spleen:  Within normal limits in size and appearance. Adrenals/Urinary Tract: Normal adrenal glands. Simple appearing cyst within the upper pole of right kidney is uniformly T2 hyperintense measuring 1.8 cm. No signs of hydronephrosis identified bilaterally. Stomach/Bowel: Stomach is nondistended. No pathologic dilatation of the visualized bowel loops. Colonic diverticulosis. Sub optimal visualization of recently demonstrated diverticular changes involving the sigmoid colon. Vascular/Lymphatic: No aneurysm visualized. Atherosclerotic changes involving the abdominal aorta. No adenopathy. Other:  No significant ascites. Musculoskeletal: No suspicious bone lesions identified. IMPRESSION: 1. There is a large, complex, extensively septated mass centered around segment 8 of the liver and extending into segments 4, 5 and 7. This is a new finding when compared with CT from 03/27/2023. This is favored to represent a large hepatic abscess. Correlate for any clinical signs or symptoms of infection. A neoplastic process is considered less likely. 2. Previous cholecystectomy. Chronic increased caliber of the common bile duct measuring 1.2 cm with mild intrahepatic bile duct dilatation, unchanged from 02/28/2021. No sign of choledocholithiasis. 3. Colonic diverticulosis. Sub optimal visualization of recently demonstrated diverticular changes  involving the sigmoid colon. Electronically Signed   By: Signa Kell M.D.   On: 09/16/2023 06:29   MR LUMBAR SPINE WO CONTRAST Result Date: 09/16/2023 CLINICAL DATA:  Low back pain EXAM: MRI LUMBAR SPINE WITHOUT CONTRAST TECHNIQUE: Multiplanar, multisequence MR imaging of the lumbar spine was performed. No intravenous contrast was administered. COMPARISON:  04/18/2017 FINDINGS: Segmentation:  Standard. Alignment:  Physiologic. Vertebrae:  No fracture,  evidence of discitis, or bone lesion. Conus medullaris and cauda equina: Conus extends to the L1 level. Conus and cauda equina appear normal. Paraspinal and other soft tissues: Incompletely visualized multi-septated collection within the liver. Disc levels: L1-L2: Normal disc space and facet joints. No spinal canal stenosis. No neural foraminal stenosis. L2-L3: Small disc bulge. Mild spinal canal stenosis. No neural foraminal stenosis. L3-L4: Small disc bulge. No spinal canal stenosis. Unchanged moderate bilateral neural foraminal stenosis. L4-L5: Small disc bulge with endplate spurring and mild facet hypertrophy. No spinal canal stenosis. Unchanged severe bilateral neural foraminal stenosis. L5-S1: Moderate facet hypertrophy and small right asymmetric disc bulge. No spinal canal stenosis. Unchanged moderate right neural foraminal stenosis. Visualized sacrum: Normal. IMPRESSION: 1. Unchanged severe bilateral L4-L5, moderate bilateral L3-L4 and right L5-S1 neural foraminal stenosis. 2. Mild L2-L3 spinal canal stenosis. 3. Incompletely visualized multi-septated collection within the liver. Electronically Signed   By: Deatra Robinson M.D.   On: 09/16/2023 02:36    Assessment/Plan: Hepatic abscess s/p perc drain yesterday Cx pending Cont drain care/teaching, pt thinks she will be comfortable doing flushes and drain care herself. IR will set up for outpt imaging and follow up.     LOS: 2 days   I spent a total of 20 minutes in face to face in clinical consultation, greater than 50% of which was counseling/coordinating care for hepatic abscess drain  Brayton El PA-C 09/17/2023 10:26 AM

## 2023-09-17 NOTE — Progress Notes (Signed)
Progress Note    Ashley Galloway   ZOX:[id number]  DOB: 05-Jul-1951  DOA: 09/14/2023     2 PCP: Renford Dills, MD  Initial CC: abdominal pain  Hospital Course: Ms. Cookingham is a 72 year old female admitted with weakness and abdominal discomfort.  She was also complaining of some lower back pain radiating down her right leg. CT abdomen/pelvis showed concern for hepatic abscess measuring 11 x 8.7 x 12.4 cm.  Also concerning for diverticulitis involving descending and sigmoid colon. General surgery was consulted and she was started on antibiotics. MRI liver also obtained after admission showing underlying hepatic abscess as seen on CT. MRI lumbar spine showed underlying stenosis from approximately L3-S1, worse in L4-L5.  Interval History:  No events overnight.  Continues to feel better and improve. Tolerating liquids, advancing to soft diet today. Ambulating well.   Assessment and Plan: * Hepatic abscess - per CT: " Interval development of a multiloculated, heavily septated collection within the central aspects of segment 4-8 extending to the biliary hilum most in keeping with a developing hepatic abscess measuring 11.0 x 8.7 x 12.4 cm in greatest dimension" - continue rocephin and flagyl - suspect the strep bacteremia is translocated from hepatic abscess as well -General Surgery following, appreciate assistance - patient underwent IR drain placement on 12/18; follow up fluid culture - ID consulted given overall complexity and rec's for course length etc  Streptococcal bacteremia - suspect the strep bacteremia is translocated from hepatic abscess - now growing Strep intermedius in 2/2 bottles from 12/16 - 12/17 and 12/18 remain negative  - follow up further ID rec's  Diverticulitis - mild and noted on CT A/P as well on admission - diet advancement per surgery; advanced to soft diet on 09/17/2023 - on CTX/flagyl  Lumbar stenosis - Patient was endorsing right leg  neuropathic pains at times - MRI L-spine confirms underlying stenosis worse at L4-5 but involving L3-S1 -Supportive treatment for now; can have referral outpatient to neurosurgery after resolution of acute infection   Old records reviewed in assessment of this patient  Antimicrobials: Rocephin 09/14/2023 >> current Flagyl 09/14/2023 >> current  DVT prophylaxis:  enoxaparin (LOVENOX) injection 40 mg Start: 09/15/23 2200   Code Status:   Code Status: Full Code  Mobility Assessment (Last 72 Hours)     Mobility Assessment     Row Name 09/16/23 2041 09/16/23 1010 09/15/23 0742 09/15/23 0021     Does patient have an order for bedrest or is patient medically unstable No - Continue assessment No - Continue assessment No - Continue assessment No - Continue assessment    What is the highest level of mobility based on the progressive mobility assessment? Level 6 (Walks independently in room and hall) - Balance while walking in room without assist - Complete Level 6 (Walks independently in room and hall) - Balance while walking in room without assist - Complete Level 6 (Walks independently in room and hall) - Balance while walking in room without assist - Complete Level 5 (Walks with assist in room/hall) - Balance while stepping forward/back and can walk in room with assist - Complete             Barriers to discharge: none Disposition Plan:  Home Status is: Inpt  Objective: Blood pressure (!) 120/92, pulse 69, temperature 98.4 F (36.9 C), temperature source Oral, resp. rate 20, height 5\' 7"  (1.702 m), weight 84.4 kg, SpO2 100%.  Examination:  Physical Exam Constitutional:      Appearance: Normal  appearance.  HENT:     Head: Normocephalic and atraumatic.     Mouth/Throat:     Mouth: Mucous membranes are moist.  Eyes:     Extraocular Movements: Extraocular movements intact.  Cardiovascular:     Rate and Rhythm: Normal rate and regular rhythm.  Pulmonary:     Effort: Pulmonary  effort is normal. No respiratory distress.     Breath sounds: Normal breath sounds. No wheezing.  Abdominal:     General: Bowel sounds are normal. There is no distension.     Palpations: Abdomen is soft.     Tenderness: There is no abdominal tenderness.     Comments: Right abd drain in place with serosang fluid in bulb  Musculoskeletal:        General: Normal range of motion.     Cervical back: Normal range of motion and neck supple.  Skin:    General: Skin is warm and dry.  Neurological:     General: No focal deficit present.     Mental Status: She is alert.  Psychiatric:        Mood and Affect: Mood normal.      Consultants:  General surgery  Procedures:  09/16/2023: Image guided hepatic abscess drain placement.  280 cc immediately drained of purulent fluid  Data Reviewed: Results for orders placed or performed during the hospital encounter of 09/14/23 (from the past 24 hours)  Protime-INR     Status: Abnormal   Collection Time: 09/16/23  3:26 PM  Result Value Ref Range   Prothrombin Time 16.3 (H) 11.4 - 15.2 seconds   INR 1.3 (H) 0.8 - 1.2  Aerobic/Anaerobic Culture w Gram Stain (surgical/deep wound)     Status: None (Preliminary result)   Collection Time: 09/16/23  5:18 PM   Specimen: Abscess  Result Value Ref Range   Specimen Description ABSCESS    Special Requests NONE    Gram Stain NO WBC SEEN NO ORGANISMS SEEN     Culture      NO GROWTH < 12 HOURS Performed at [facility], [address], [place], [place] [address]    Report Status PENDING   CBC with Differential/Platelet     Status: Abnormal   Collection Time: 09/17/23  6:43 AM  Result Value Ref Range   WBC 15.9 (H) 4.0 - 10.5 K/uL   RBC 4.45 3.87 - 5.11 MIL/uL   Hemoglobin 13.1 12.0 - 15.0 g/dL   HCT 01.0 27.2 - 53.6 %   MCV 85.6 80.0 - 100.0 fL   MCH 29.4 26.0 - 34.0 pg   MCHC 34.4 30.0 - 36.0 g/dL   RDW 64.4 03.4 - 74.2 %   Platelets 205 150 - 400 K/uL   nRBC 0.1 0.0 - 0.2 %   Neutrophils  Relative % 86 %   Neutro Abs 13.7 (H) 1.7 - 7.7 K/uL   Lymphocytes Relative 7 %   Lymphs Abs 1.1 0.7 - 4.0 K/uL   Monocytes Relative 4 %   Monocytes Absolute 0.7 0.1 - 1.0 K/uL   Eosinophils Relative 0 %   Eosinophils Absolute 0.0 0.0 - 0.5 K/uL   Basophils Relative 1 %   Basophils Absolute 0.1 0.0 - 0.1 K/uL   WBC Morphology MORPHOLOGY UNREMARKABLE    RBC Morphology MORPHOLOGY UNREMARKABLE    Smear Review Normal platelet morphology    Immature Granulocytes 2 %   Abs Immature Granulocytes 0.25 (H) 0.00 - 0.07 K/uL  Magnesium     Status:  None   Collection Time: 09/17/23  6:43 AM  Result Value Ref Range   Magnesium 2.4 1.7 - 2.4 mg/dL  Comprehensive metabolic panel     Status: Abnormal   Collection Time: 09/17/23  6:43 AM  Result Value Ref Range   Sodium 135 135 - 145 mmol/L   Potassium 3.6 3.5 - 5.1 mmol/L   Chloride 107 98 - 111 mmol/L   CO2 21 (L) 22 - 32 mmol/L   Glucose, Bld 128 (H) 70 - 99 mg/dL   BUN 25 (H) 8 - 23 mg/dL   Creatinine, Ser 2.53 0.44 - 1.00 mg/dL   Calcium 8.1 (L) 8.9 - 10.3 mg/dL   Total Protein 6.1 (L) 6.5 - 8.1 g/dL   Albumin 2.0 (L) 3.5 - 5.0 g/dL   AST 79 (H) 15 - 41 U/L   ALT 112 (H) 0 - 44 U/L   Alkaline Phosphatase 93 38 - 126 U/L   Total Bilirubin 0.7 <1.2 mg/dL   GFR, Estimated >66 >44 mL/min   Anion gap 7 5 - 15    I have reviewed pertinent nursing notes, vitals, labs, and images as necessary. I have ordered labwork to follow up on as indicated.  I have reviewed the last notes from staff over past 24 hours. I have discussed patient's care plan and test results with nursing staff, CM/SW, and other staff as appropriate.  Time spent: Greater than 50% of the 55 minute visit was spent in counseling/coordination of care for the patient as laid out in the A&P.   LOS: 2 days   Lewie Chamber, MD [facility] 09/17/2023, 2:08 PM

## 2023-09-17 NOTE — Consult Note (Signed)
[facility]    Date of Admission:  09/14/2023     Total days of antibiotics 4               Reason for Consult: Hepatic    Referring Provider: Dr. Frederick Peers  Primary Care Provider: Renford Dills, MD   ASSESSMENT:  Ashley Galloway is 72 y/o AA female admitted with abdominal and flank pain and found to have hepatic abscess with Streptococcus intermedius bacteremia. POD # 1 from percutaneous drain placement with 280 ml of purulent fluid obtained. No organisms seen on gram stain and cultures without growth in <24 hours. Blood cultures from 09/16/23 are without growth. Discussed plan of to continue with current dose of ceftriaxone and will monitor cultures for clearance of bacteremia and also organisms from drain placement. Remaining medical and supportive care per Internal Medicine.   PLAN:  Continue current dose of ceftriaxone and metronidazole.  Monitor blood culture and abscess drain culture for clearance of bacteremia and any organisms. Drain management per IR.  Remaining medical and supportive care per Internal Medicine.    Principal Problem:   Hepatic abscess Active Problems:   Diverticulitis   Lumbar stenosis   Streptococcal bacteremia    enoxaparin (LOVENOX) injection  40 mg Subcutaneous Q24H   feeding supplement  237 mL Oral BID BM   sodium chloride flush  5 mL Intracatheter Q8H     HPI: Ashley Galloway is a 72 y.o. female with previous medical history of colon polyps, diverticulitis and tubulovillous adenoma presenting the hospital with abdominal and bilateral flank pan.   Ashley Galloway presented with 1 week history of weakness and epigastric pain that radiated to her bilateral flanks. Experienced nausea and vomiting with eating and oral intake has been minimal. Febrile with temperature of 100.8 F. Chest x-ray with bibasilar atelectasis. CT abdomen/pelvis showing multiloculated, heavily septated fluid collection consistent with  developing hepatic abscess. MRI liver with large, complex, septated mass centered around segment 8 of the liver. MRI lumbar spine with no findings concerning for infection. Started on ceftriaxone and metronidazole. Percutaneous drain placed on 09/16/23 with 280 ml of purulent fluid obtained.  Blood cultures are positive for Streptococcus intermedius in 1 set of cultures. Secondary cultures from 09/16/23 are without growth to date. Cultures from drain placement with no organisms seen on gram stain and no growth on culture in <24 hours. Tolerating antibiotics with no adverse side effects. Feeling better since arriving to the hospital. No recent travel and is not around any pets.     Review of Systems: Review of Systems  Constitutional:  Negative for chills, fever and weight loss.  Respiratory:  Negative for cough, shortness of breath and wheezing.   Cardiovascular:  Negative for chest pain and leg swelling.  Gastrointestinal:  Negative for abdominal pain, constipation, diarrhea, nausea and vomiting.  Skin:  Negative for rash.     Past Medical History:  Diagnosis Date   Centrilobular emphysema (HCC)    Colon polyps    Looks like saw Dr. Marca Ancona in 2021   Diverticulitis    HLD (hyperlipidemia)    TIA (transient ischemic attack)    Vesicointestinal fistula     Social History   Tobacco Use   Smoking status: Every Day    Types: Cigarettes    Passive exposure: Current   Smokeless tobacco: Never    Family History  Problem Relation Age of Onset   Breast cancer Neg Hx     Allergies  Allergen Reactions   Tape     Tape from hormone patch.    OBJECTIVE: Blood pressure (!) 120/92, pulse 69, temperature 98.4 F (36.9 C), temperature source Oral, resp. rate 20, height 5\' 7"  (1.702 m), weight 84.4 kg, SpO2 100%.  Physical Exam Constitutional:      General: She is not in acute distress.    Appearance: She is well-developed.  Cardiovascular:     Rate and Rhythm: Normal rate and  regular rhythm.     Heart sounds: Normal heart sounds.  Pulmonary:     Effort: Pulmonary effort is normal.     Breath sounds: Normal breath sounds.  Abdominal:     Comments: Percutaneous drain in place with charged JP drain.   Skin:    General: Skin is warm and dry.  Neurological:     Mental Status: She is alert and oriented to person, place, and time.  Psychiatric:        Mood and Affect: Mood normal.     Lab Results Lab Results  Component Value Date   WBC 15.9 (H) 09/17/2023   HGB 13.1 09/17/2023   HCT 38.1 09/17/2023   MCV 85.6 09/17/2023   PLT 205 09/17/2023    Lab Results  Component Value Date   CREATININE 0.93 09/17/2023   BUN 25 (H) 09/17/2023   NA 135 09/17/2023   K 3.6 09/17/2023   CL 107 09/17/2023   CO2 21 (L) 09/17/2023    Lab Results  Component Value Date   ALT 112 (H) 09/17/2023   AST 79 (H) 09/17/2023   ALKPHOS 93 09/17/2023   BILITOT 0.7 09/17/2023     Microbiology: Recent Results (from the past 240 hours)  Resp panel by RT-PCR (RSV, Flu A&B, Covid) Urine, Clean Catch     Status: None   Collection Time: 09/14/23  6:56 PM   Specimen: Urine, Clean Catch; Nasal Swab  Result Value Ref Range Status   SARS Coronavirus 2 by RT PCR NEGATIVE NEGATIVE Final    Comment: (NOTE) SARS-CoV-2 target nucleic acids are NOT DETECTED.  The SARS-CoV-2 RNA is generally detectable in upper respiratory specimens during the acute phase of infection. The lowest concentration of SARS-CoV-2 viral copies this assay can detect is 138 copies/mL. A negative result does not preclude SARS-Cov-2 infection and should not be used as the sole basis for treatment or other patient management decisions. A negative result may occur with  improper specimen collection/handling, submission of specimen other than nasopharyngeal swab, presence of viral mutation(s) within the areas targeted by this assay, and inadequate number of viral copies(<138 copies/mL). A negative result must be  combined with clinical observations, patient history, and epidemiological information. The expected result is Negative.  Fact Sheet for Patients:  BloggerCourse.com  Fact Sheet for Healthcare Providers:  SeriousBroker.it  This test is no t yet approved or cleared by the [place] FDA and  has been authorized for detection and/or diagnosis of SARS-CoV-2 by FDA under an Emergency Use Authorization (EUA). This EUA will remain  in effect (meaning this test can be used) for the duration of the COVID-19 declaration under Section 564(b)(1) of the Act, 21 U.S.C.section 360bbb-3(b)(1), unless the authorization is terminated  or revoked sooner.       Influenza A by PCR NEGATIVE NEGATIVE Final   Influenza B by PCR NEGATIVE NEGATIVE Final    Comment: (NOTE) The Xpert Xpress SARS-CoV-2/FLU/RSV plus assay is intended as an aid in the diagnosis of influenza from Nasopharyngeal swab specimens and  should not be used as a sole basis for treatment. Nasal washings and aspirates are unacceptable for Xpert Xpress SARS-CoV-2/FLU/RSV testing.  Fact Sheet for Patients: BloggerCourse.com  Fact Sheet for Healthcare Providers: SeriousBroker.it  This test is not yet approved or cleared by the [place] FDA and has been authorized for detection and/or diagnosis of SARS-CoV-2 by FDA under an Emergency Use Authorization (EUA). This EUA will remain in effect (meaning this test can be used) for the duration of the COVID-19 declaration under Section 564(b)(1) of the Act, 21 U.S.C. section 360bbb-3(b)(1), unless the authorization is terminated or revoked.     Resp Syncytial Virus by PCR NEGATIVE NEGATIVE Final    Comment: (NOTE) Fact Sheet for Patients: BloggerCourse.com  Fact Sheet for Healthcare Providers: SeriousBroker.it  This test is not yet  approved or cleared by the [place] FDA and has been authorized for detection and/or diagnosis of SARS-CoV-2 by FDA under an Emergency Use Authorization (EUA). This EUA will remain in effect (meaning this test can be used) for the duration of the COVID-19 declaration under Section 564(b)(1) of the Act, 21 U.S.C. section 360bbb-3(b)(1), unless the authorization is terminated or revoked.  Performed at [facility], [address] [place], [place] [address]   Culture, blood (routine x 2)     Status: Abnormal (Preliminary result)   Collection Time: 09/14/23  9:27 PM   Specimen: BLOOD  Result Value Ref Range Status   Specimen Description BLOOD RIGHT ANTECUBITAL  Final   Special Requests   Final    BOTTLES DRAWN AEROBIC AND ANAEROBIC Blood Culture results may not be optimal due to an inadequate volume of blood received in culture bottles   Culture  Setup Time   Final    GRAM POSITIVE COCCI IN PAIRS IN CHAINS IN BOTH AEROBIC AND ANAEROBIC BOTTLES CRITICAL RESULT CALLED TO, READ BACK BY AND VERIFIED WITH: PHARMD A. PAYTES 09/16/2023 @ 0650 BY AB    Culture (A)  Final    STREPTOCOCCUS INTERMEDIUS THE SIGNIFICANCE OF ISOLATING THIS ORGANISM FROM A SINGLE VENIPUNCTURE CANNOT BE PREDICTED WITHOUT FURTHER CLINICAL AND CULTURE CORRELATION. SUSCEPTIBILITIES AVAILABLE ONLY ON REQUEST. Performed at [facility], [address] [place], [place] [address]    Report Status PENDING  Incomplete  Blood Culture ID Panel (Reflexed)     Status: Abnormal   Collection Time: 09/14/23  9:27 PM  Result Value Ref Range Status   Enterococcus faecalis NOT DETECTED NOT DETECTED Final   Enterococcus Faecium NOT DETECTED NOT DETECTED Final   Listeria monocytogenes NOT DETECTED NOT DETECTED Final   Staphylococcus species NOT DETECTED NOT DETECTED Final   Staphylococcus aureus (BCID) NOT DETECTED NOT DETECTED Final   Staphylococcus epidermidis NOT DETECTED NOT DETECTED Final    Staphylococcus lugdunensis NOT DETECTED NOT DETECTED Final   Streptococcus species DETECTED (A) NOT DETECTED Final    Comment: Not Enterococcus species, Streptococcus agalactiae, Streptococcus pyogenes, or Streptococcus pneumoniae. CRITICAL RESULT CALLED TO, READ BACK BY AND VERIFIED WITH: PHARMD A. PAYTES 09/16/2023 @ 0650 BY AB    Streptococcus agalactiae NOT DETECTED NOT DETECTED Final   Streptococcus pneumoniae NOT DETECTED NOT DETECTED Final   Streptococcus pyogenes NOT DETECTED NOT DETECTED Final   A.calcoaceticus-baumannii NOT DETECTED NOT DETECTED Final   Bacteroides fragilis NOT DETECTED NOT DETECTED Final   Enterobacterales NOT DETECTED NOT DETECTED Final   Enterobacter cloacae complex NOT DETECTED NOT DETECTED Final   Escherichia coli NOT DETECTED NOT DETECTED Final   Klebsiella aerogenes NOT DETECTED NOT DETECTED  Final   Klebsiella oxytoca NOT DETECTED NOT DETECTED Final   Klebsiella pneumoniae NOT DETECTED NOT DETECTED Final   Proteus species NOT DETECTED NOT DETECTED Final   Salmonella species NOT DETECTED NOT DETECTED Final   Serratia marcescens NOT DETECTED NOT DETECTED Final   Haemophilus influenzae NOT DETECTED NOT DETECTED Final   Neisseria meningitidis NOT DETECTED NOT DETECTED Final   Pseudomonas aeruginosa NOT DETECTED NOT DETECTED Final   Stenotrophomonas maltophilia NOT DETECTED NOT DETECTED Final   Candida albicans NOT DETECTED NOT DETECTED Final   Candida auris NOT DETECTED NOT DETECTED Final   Candida glabrata NOT DETECTED NOT DETECTED Final   Candida krusei NOT DETECTED NOT DETECTED Final   Candida parapsilosis NOT DETECTED NOT DETECTED Final   Candida tropicalis NOT DETECTED NOT DETECTED Final   Cryptococcus neoformans/gattii NOT DETECTED NOT DETECTED Final    Comment: Performed at [facility], [address] [place], [place] [address]  Culture, blood (routine x 2)     Status: None (Preliminary result)   Collection Time: 09/15/23  1:26 AM    Specimen: BLOOD RIGHT HAND  Result Value Ref Range Status   Specimen Description BLOOD RIGHT HAND  Final   Special Requests   Final    BOTTLES DRAWN AEROBIC AND ANAEROBIC Blood Culture adequate volume   Culture   Final    NO GROWTH 2 DAYS Performed at [facility], [address] [place], [place] [address]    Report Status PENDING  Incomplete  Culture, blood (Routine X 2) w Reflex to ID Panel     Status: None (Preliminary result)   Collection Time: 09/16/23  9:25 AM   Specimen: BLOOD LEFT HAND  Result Value Ref Range Status   Specimen Description BLOOD LEFT HAND  Final   Special Requests   Final    BOTTLES DRAWN AEROBIC AND ANAEROBIC Blood Culture results may not be optimal due to an inadequate volume of blood received in culture bottles   Culture   Final    NO GROWTH < 24 HOURS Performed at [facility], [address], [place], [place] [address]    Report Status PENDING  Incomplete  Culture, blood (Routine X 2) w Reflex to ID Panel     Status: None (Preliminary result)   Collection Time: 09/16/23  9:25 AM   Specimen: BLOOD LEFT HAND  Result Value Ref Range Status   Specimen Description BLOOD LEFT HAND  Final   Special Requests   Final    BOTTLES DRAWN AEROBIC AND ANAEROBIC Blood Culture results may not be optimal due to an inadequate volume of blood received in culture bottles   Culture   Final    NO GROWTH < 24 HOURS Performed at [facility], [address] [place], [place] [address]    Report Status PENDING  Incomplete  Aerobic/Anaerobic Culture w Gram Stain (surgical/deep wound)     Status: None (Preliminary result)   Collection Time: 09/16/23  5:18 PM   Specimen: Abscess  Result Value Ref Range Status   Specimen Description ABSCESS  Final   Special Requests NONE  Final   Gram Stain NO WBC SEEN NO ORGANISMS SEEN   Final   Culture   Final    NO GROWTH < 12 HOURS Performed at [facility], [address], [place], [place] [address]     Report Status PENDING  Incomplete     Marcos Eke, NP [facility]  [facility]  09/17/2023  12:46 PM

## 2023-09-18 ENCOUNTER — Other Ambulatory Visit: Payer: Self-pay

## 2023-09-18 DIAGNOSIS — K5792 Diverticulitis of intestine, part unspecified, without perforation or abscess without bleeding: Secondary | ICD-10-CM | POA: Diagnosis not present

## 2023-09-18 DIAGNOSIS — B955 Unspecified streptococcus as the cause of diseases classified elsewhere: Secondary | ICD-10-CM | POA: Diagnosis not present

## 2023-09-18 DIAGNOSIS — K750 Abscess of liver: Secondary | ICD-10-CM | POA: Diagnosis not present

## 2023-09-18 DIAGNOSIS — R7881 Bacteremia: Secondary | ICD-10-CM | POA: Diagnosis not present

## 2023-09-18 LAB — CBC WITH DIFFERENTIAL/PLATELET
Abs Immature Granulocytes: 0.7 10*3/uL — ABNORMAL HIGH (ref 0.00–0.07)
Basophils Absolute: 0.1 10*3/uL (ref 0.0–0.1)
Basophils Relative: 0 %
Eosinophils Absolute: 0 10*3/uL (ref 0.0–0.5)
Eosinophils Relative: 0 %
HCT: 36.4 % (ref 36.0–46.0)
Hemoglobin: 12.8 g/dL (ref 12.0–15.0)
Immature Granulocytes: 3 %
Lymphocytes Relative: 8 %
Lymphs Abs: 1.6 10*3/uL (ref 0.7–4.0)
MCH: 30.1 pg (ref 26.0–34.0)
MCHC: 35.2 g/dL (ref 30.0–36.0)
MCV: 85.6 fL (ref 80.0–100.0)
Monocytes Absolute: 1.1 10*3/uL — ABNORMAL HIGH (ref 0.1–1.0)
Monocytes Relative: 5 %
Neutro Abs: 17.1 10*3/uL — ABNORMAL HIGH (ref 1.7–7.7)
Neutrophils Relative %: 84 %
Platelets: 260 10*3/uL (ref 150–400)
RBC: 4.25 MIL/uL (ref 3.87–5.11)
RDW: 14.4 % (ref 11.5–15.5)
WBC Morphology: INCREASED
WBC: 20.5 10*3/uL — ABNORMAL HIGH (ref 4.0–10.5)
nRBC: 0.1 % (ref 0.0–0.2)

## 2023-09-18 LAB — COMPREHENSIVE METABOLIC PANEL
ALT: 70 U/L — ABNORMAL HIGH (ref 0–44)
AST: 40 U/L (ref 15–41)
Albumin: 1.9 g/dL — ABNORMAL LOW (ref 3.5–5.0)
Alkaline Phosphatase: 88 U/L (ref 38–126)
Anion gap: 9 (ref 5–15)
BUN: 21 mg/dL (ref 8–23)
CO2: 22 mmol/L (ref 22–32)
Calcium: 8.5 mg/dL — ABNORMAL LOW (ref 8.9–10.3)
Chloride: 104 mmol/L (ref 98–111)
Creatinine, Ser: 0.83 mg/dL (ref 0.44–1.00)
GFR, Estimated: >60 mL/min (ref >60)
Glucose, Bld: 98 mg/dL (ref 70–99)
Potassium: 3.6 mmol/L (ref 3.5–5.1)
Sodium: 135 mmol/L (ref 135–145)
Total Bilirubin: 0.8 mg/dL (ref <1.2)
Total Protein: 5.8 g/dL — ABNORMAL LOW (ref 6.5–8.1)

## 2023-09-18 LAB — GLUCOSE, CAPILLARY: Glucose-Capillary: 100 mg/dL — ABNORMAL HIGH (ref 70–99)

## 2023-09-18 LAB — MAGNESIUM: Magnesium: 2 mg/dL (ref 1.7–2.4)

## 2023-09-18 MED ORDER — SODIUM CHLORIDE 0.9% FLUSH
10.0000 mL | INTRAVENOUS | Status: DC | PRN
  Administered 2023-09-19: 10 mL

## 2023-09-18 MED ORDER — CHLORHEXIDINE GLUCONATE CLOTH 2 % EX PADS
6.0000 | MEDICATED_PAD | Freq: Every day | CUTANEOUS | Status: DC
  Administered 2023-09-18 – 2023-09-19 (×2): 6 via TOPICAL

## 2023-09-18 NOTE — Progress Notes (Signed)
Progress Note    Ashley Galloway   YQM:[id number]  DOB: 09-Mar-1951  DOA: 09/14/2023     3 PCP: Ashley Dills, MD  Initial CC: abdominal pain  Hospital Course: Ashley Galloway is a 72 year old female admitted with weakness and abdominal discomfort.  She was also complaining of some lower back pain radiating down her right leg. CT abdomen/pelvis showed concern for hepatic abscess measuring 11 x 8.7 x 12.4 cm.  Also concerning for diverticulitis involving descending and sigmoid colon. General surgery was consulted and she was started on antibiotics. MRI liver also obtained after admission showing underlying hepatic abscess as seen on CT. MRI lumbar spine showed underlying stenosis from approximately L3-S1, worse in L4-L5.  Interval History:  No events overnight.  Overall still feeling improved.  She is anxious for going home but understands we are now waiting for final sensitivities from cultures. PICC line ordered today.  Assessment and Plan: * Hepatic abscess - per CT: " Interval development of a multiloculated, heavily septated collection within the central aspects of segment 4-8 extending to the biliary hilum most in keeping with a developing hepatic abscess measuring 11.0 x 8.7 x 12.4 cm in greatest dimension" - continue rocephin and flagyl; awaiting final sensitivities - suspect the strep bacteremia is translocated from hepatic abscess as well; fluid culture from abscess also growing strep intermedius -General Surgery following, appreciate assistance - patient underwent IR drain placement on 12/18; follow up fluid culture - ID consulted given overall complexity and rec's for course length etc -Tentative plan for 6-week course of Rocephin per ID.  PICC line ordered 09/18/2023  Streptococcal bacteremia - suspect the strep bacteremia is translocated from hepatic abscess; both growing strep intermedius - growing Strep intermedius in 2/2 bottles from 12/16 - 12/17 and 12/18  remain negative  -TTE recommended per ID, follow-up results -Awaiting final sensitivities as noted above before discharge   Diverticulitis - mild and noted on CT A/P as well on admission - diet advancement per surgery; advanced to soft diet on 09/17/2023 - on CTX/flagyl  Lumbar stenosis - Patient was endorsing right leg neuropathic pains at times - MRI L-spine confirms underlying stenosis worse at L4-5 but involving L3-S1 -Supportive treatment for now; can have referral outpatient to neurosurgery after resolution of acute infection   Old records reviewed in assessment of this patient  Antimicrobials: Rocephin 09/14/2023 >> current Flagyl 09/14/2023 >> current  DVT prophylaxis:  enoxaparin (LOVENOX) injection 40 mg Start: 09/15/23 2200   Code Status:   Code Status: Full Code  Mobility Assessment (Last 72 Hours)     Mobility Assessment     Row Name 09/17/23 1935 09/17/23 1045 09/16/23 2041 09/16/23 1010     Does patient have an order for bedrest or is patient medically unstable No - Continue assessment No - Continue assessment No - Continue assessment No - Continue assessment    What is the highest level of mobility based on the progressive mobility assessment? Level 5 (Walks with assist in room/hall) - Balance while stepping forward/back and can walk in room with assist - Complete Level 6 (Walks independently in room and hall) - Balance while walking in room without assist - Complete Level 6 (Walks independently in room and hall) - Balance while walking in room without assist - Complete Level 6 (Walks independently in room and hall) - Balance while walking in room without assist - Complete             Barriers to discharge: none Disposition Plan:  Home Status is: Inpt  Objective: Blood pressure 115/68, pulse (!) 59, temperature 98.1 F (36.7 C), temperature source Oral, resp. rate 16, height 5\' 7"  (1.702 m), weight 84.4 kg, SpO2 97%.  Examination:  Physical  Exam Constitutional:      Appearance: Normal appearance.  HENT:     Head: Normocephalic and atraumatic.     Mouth/Throat:     Mouth: Mucous membranes are moist.  Eyes:     Extraocular Movements: Extraocular movements intact.  Cardiovascular:     Rate and Rhythm: Normal rate and regular rhythm.  Pulmonary:     Effort: Pulmonary effort is normal. No respiratory distress.     Breath sounds: Normal breath sounds. No wheezing.  Abdominal:     General: Bowel sounds are normal. There is no distension.     Palpations: Abdomen is soft.     Tenderness: There is no abdominal tenderness.     Comments: Right abd drain in place with serosang fluid in bulb  Musculoskeletal:        General: Normal range of motion.     Cervical back: Normal range of motion and neck supple.  Skin:    General: Skin is warm and dry.  Neurological:     General: No focal deficit present.     Mental Status: She is alert.  Psychiatric:        Mood and Affect: Mood normal.      Consultants:  General surgery ID  Procedures:  09/16/2023: Image guided hepatic abscess drain placement.  280 cc immediately drained of purulent fluid  Data Reviewed: Results for orders placed or performed during the hospital encounter of 09/14/23 (from the past 24 hours)  Glucose, capillary     Status: Abnormal   Collection Time: 09/18/23  6:55 AM  Result Value Ref Range   Glucose-Capillary 100 (H) 70 - 99 mg/dL   Comment 1 Repeat Test   CBC with Differential/Platelet     Status: Abnormal   Collection Time: 09/18/23  7:00 AM  Result Value Ref Range   WBC 20.5 (H) 4.0 - 10.5 K/uL   RBC 4.25 3.87 - 5.11 MIL/uL   Hemoglobin 12.8 12.0 - 15.0 g/dL   HCT 41.3 24.4 - 01.0 %   MCV 85.6 80.0 - 100.0 fL   MCH 30.1 26.0 - 34.0 pg   MCHC 35.2 30.0 - 36.0 g/dL   RDW 27.2 53.6 - 64.4 %   Platelets 260 150 - 400 K/uL   nRBC 0.1 0.0 - 0.2 %   Neutrophils Relative % 84 %   Neutro Abs 17.1 (H) 1.7 - 7.7 K/uL   Lymphocytes Relative 8 %    Lymphs Abs 1.6 0.7 - 4.0 K/uL   Monocytes Relative 5 %   Monocytes Absolute 1.1 (H) 0.1 - 1.0 K/uL   Eosinophils Relative 0 %   Eosinophils Absolute 0.0 0.0 - 0.5 K/uL   Basophils Relative 0 %   Basophils Absolute 0.1 0.0 - 0.1 K/uL   WBC Morphology INCREASED BANDS (>20% BANDS)    RBC Morphology See Note    Smear Review See Note    Immature Granulocytes 3 %   Abs Immature Granulocytes 0.70 (H) 0.00 - 0.07 K/uL   Burr Cells PRESENT   Magnesium     Status: None   Collection Time: 09/18/23  7:00 AM  Result Value Ref Range   Magnesium 2.0 1.7 - 2.4 mg/dL  Comprehensive metabolic panel     Status: Abnormal   Collection Time:  09/18/23  7:00 AM  Result Value Ref Range   Sodium 135 135 - 145 mmol/L   Potassium 3.6 3.5 - 5.1 mmol/L   Chloride 104 98 - 111 mmol/L   CO2 22 22 - 32 mmol/L   Glucose, Bld 98 70 - 99 mg/dL   BUN 21 8 - 23 mg/dL   Creatinine, Ser 6.04 0.44 - 1.00 mg/dL   Calcium 8.5 (L) 8.9 - 10.3 mg/dL   Total Protein 5.8 (L) 6.5 - 8.1 g/dL   Albumin 1.9 (L) 3.5 - 5.0 g/dL   AST 40 15 - 41 U/L   ALT 70 (H) 0 - 44 U/L   Alkaline Phosphatase 88 38 - 126 U/L   Total Bilirubin 0.8 <1.2 mg/dL   GFR, Estimated >54 >09 mL/min   Anion gap 9 5 - 15    I have reviewed pertinent nursing notes, vitals, labs, and images as necessary. I have ordered labwork to follow up on as indicated.  I have reviewed the last notes from staff over past 24 hours. I have discussed patient's care plan and test results with nursing staff, CM/SW, and other staff as appropriate.  Time spent: Greater than 50% of the 55 minute visit was spent in counseling/coordination of care for the patient as laid out in the A&P.   LOS: 3 days   Lewie Chamber, MD [facility] 09/18/2023, 3:59 PM

## 2023-09-18 NOTE — Progress Notes (Signed)
[facility]  Date of Admission:  09/14/2023     Total days of antibiotics 5         ASSESSMENT:  Ashley Galloway is POD #2 from percutaneous drain placement for multiloculated hepatic abscess. Abscess cultures are concordant with blood culture showing Streptococcus intermedius. Blood cultures from 12/18 are without growth to date. Check TTE for endocarditis. Discussed plan of care including insertion of PICC line and recommendation for 4 weeks of antibiotics with continued drain management per Interventional Radiology. Final antibiotic recommendations pending culture and sensitivity results. Continue current dose of ceftriaxone and metronidazole which are being tolerated with no adverse side effect. PICC line ordered. Remaining medical and supportive care per Internal Medicine.   PLAN:  Continue ceftriaxone and metronidazole. Monitor cultures for sensitivities and any additional organisms. TTE to check for endocarditis.  Drain management per IR Insert PICC line.  Remaining medical and supportive care per Internal Medicine.   Dr. Daiva Eves is available this weekend for any ID related questions.   Principal Problem:   Hepatic abscess Active Problems:   Diverticulitis   Lumbar stenosis   Streptococcal bacteremia    enoxaparin (LOVENOX) injection  40 mg Subcutaneous Q24H   feeding supplement  237 mL Oral BID BM   sodium chloride flush  5 mL Intracatheter Q8H    SUBJECTIVE:  Afebrile overnight with no acute events. Has questions about the culture results. Tolerating antibiotics with no adverse side effects.   Allergies  Allergen Reactions   Tape     Tape from hormone patch.     Review of Systems: Review of Systems  Constitutional:  Negative for chills, fever and weight loss.  Respiratory:  Negative for cough, shortness of breath and wheezing.   Cardiovascular:  Negative for chest pain and leg swelling.  Gastrointestinal:  Negative for abdominal  pain, constipation, diarrhea, nausea and vomiting.  Skin:  Negative for rash.      OBJECTIVE: Vitals:   09/17/23 0829 09/17/23 1513 09/17/23 2000 09/18/23 0844  BP: (!) 120/92 (!) 115/50 122/89 115/68  Pulse: 69 66 64 (!) 59  Resp: 20 15 16 16   Temp: 98.4 F (36.9 C) 98.6 F (37 C) 98.3 F (36.8 C) 98.1 F (36.7 C)  TempSrc: Oral Oral Oral Oral  SpO2: 100% 100% 100% 97%  Weight:      Height:       Body mass index is 29.14 kg/m.  Physical Exam Constitutional:      General: She is not in acute distress.    Appearance: She is well-developed.     Comments: Seated on the side of the bed; pleasant.   Cardiovascular:     Rate and Rhythm: Normal rate and regular rhythm.     Heart sounds: Normal heart sounds.  Pulmonary:     Effort: Pulmonary effort is normal.     Breath sounds: Normal breath sounds.  Skin:    General: Skin is warm and dry.  Neurological:     Mental Status: She is alert and oriented to person, place, and time.  Psychiatric:        Mood and Affect: Mood normal.     Lab Results Lab Results  Component Value Date   WBC 20.5 (H) 09/18/2023   HGB 12.8 09/18/2023   HCT 36.4 09/18/2023   MCV 85.6 09/18/2023   PLT 260 09/18/2023    Lab Results  Component Value Date   CREATININE 0.83 09/18/2023   BUN 21 09/18/2023  NA 135 09/18/2023   K 3.6 09/18/2023   CL 104 09/18/2023   CO2 22 09/18/2023    Lab Results  Component Value Date   ALT 70 (H) 09/18/2023   AST 40 09/18/2023   ALKPHOS 88 09/18/2023   BILITOT 0.8 09/18/2023     Microbiology: Recent Results (from the past 240 hours)  Resp panel by RT-PCR (RSV, Flu A&B, Covid) Urine, Clean Catch     Status: None   Collection Time: 09/14/23  6:56 PM   Specimen: Urine, Clean Catch; Nasal Swab  Result Value Ref Range Status   SARS Coronavirus 2 by RT PCR NEGATIVE NEGATIVE Final    Comment: (NOTE) SARS-CoV-2 target nucleic acids are NOT DETECTED.  The SARS-CoV-2 RNA is generally detectable in upper  respiratory specimens during the acute phase of infection. The lowest concentration of SARS-CoV-2 viral copies this assay can detect is 138 copies/mL. A negative result does not preclude SARS-Cov-2 infection and should not be used as the sole basis for treatment or other patient management decisions. A negative result may occur with  improper specimen collection/handling, submission of specimen other than nasopharyngeal swab, presence of viral mutation(s) within the areas targeted by this assay, and inadequate number of viral copies(<138 copies/mL). A negative result must be combined with clinical observations, patient history, and epidemiological information. The expected result is Negative.  Fact Sheet for Patients:  BloggerCourse.com  Fact Sheet for Healthcare Providers:  SeriousBroker.it  This test is no t yet approved or cleared by the [place] FDA and  has been authorized for detection and/or diagnosis of SARS-CoV-2 by FDA under an Emergency Use Authorization (EUA). This EUA will remain  in effect (meaning this test can be used) for the duration of the COVID-19 declaration under Section 564(b)(1) of the Act, 21 U.S.C.section 360bbb-3(b)(1), unless the authorization is terminated  or revoked sooner.       Influenza A by PCR NEGATIVE NEGATIVE Final   Influenza B by PCR NEGATIVE NEGATIVE Final    Comment: (NOTE) The Xpert Xpress SARS-CoV-2/FLU/RSV plus assay is intended as an aid in the diagnosis of influenza from Nasopharyngeal swab specimens and should not be used as a sole basis for treatment. Nasal washings and aspirates are unacceptable for Xpert Xpress SARS-CoV-2/FLU/RSV testing.  Fact Sheet for Patients: BloggerCourse.com  Fact Sheet for Healthcare Providers: SeriousBroker.it  This test is not yet approved or cleared by the [place] FDA and has been  authorized for detection and/or diagnosis of SARS-CoV-2 by FDA under an Emergency Use Authorization (EUA). This EUA will remain in effect (meaning this test can be used) for the duration of the COVID-19 declaration under Section 564(b)(1) of the Act, 21 U.S.C. section 360bbb-3(b)(1), unless the authorization is terminated or revoked.     Resp Syncytial Virus by PCR NEGATIVE NEGATIVE Final    Comment: (NOTE) Fact Sheet for Patients: BloggerCourse.com  Fact Sheet for Healthcare Providers: SeriousBroker.it  This test is not yet approved or cleared by the [place] FDA and has been authorized for detection and/or diagnosis of SARS-CoV-2 by FDA under an Emergency Use Authorization (EUA). This EUA will remain in effect (meaning this test can be used) for the duration of the COVID-19 declaration under Section 564(b)(1) of the Act, 21 U.S.C. section 360bbb-3(b)(1), unless the authorization is terminated or revoked.  Performed at [facility], [address] [place], [place] [address]   Culture, blood (routine x 2)     Status: Abnormal (Preliminary result)   Collection Time:  09/14/23  9:27 PM   Specimen: BLOOD  Result Value Ref Range Status   Specimen Description BLOOD RIGHT ANTECUBITAL  Final   Special Requests   Final    BOTTLES DRAWN AEROBIC AND ANAEROBIC Blood Culture results may not be optimal due to an inadequate volume of blood received in culture bottles   Culture  Setup Time   Final    GRAM POSITIVE COCCI IN PAIRS IN CHAINS IN BOTH AEROBIC AND ANAEROBIC BOTTLES CRITICAL RESULT CALLED TO, READ BACK BY AND VERIFIED WITH: PHARMD A. PAYTES 09/16/2023 @ 0650 BY AB    Culture (A)  Final    STREPTOCOCCUS INTERMEDIUS SUSCEPTIBILITIES TO FOLLOW Performed at [facility], [address], [place], [place] [address]    Report Status PENDING  Incomplete  Blood Culture ID Panel (Reflexed)     Status:  Abnormal   Collection Time: 09/14/23  9:27 PM  Result Value Ref Range Status   Enterococcus faecalis NOT DETECTED NOT DETECTED Final   Enterococcus Faecium NOT DETECTED NOT DETECTED Final   Listeria monocytogenes NOT DETECTED NOT DETECTED Final   Staphylococcus species NOT DETECTED NOT DETECTED Final   Staphylococcus aureus (BCID) NOT DETECTED NOT DETECTED Final   Staphylococcus epidermidis NOT DETECTED NOT DETECTED Final   Staphylococcus lugdunensis NOT DETECTED NOT DETECTED Final   Streptococcus species DETECTED (A) NOT DETECTED Final    Comment: Not Enterococcus species, Streptococcus agalactiae, Streptococcus pyogenes, or Streptococcus pneumoniae. CRITICAL RESULT CALLED TO, READ BACK BY AND VERIFIED WITH: PHARMD A. PAYTES 09/16/2023 @ 0650 BY AB    Streptococcus agalactiae NOT DETECTED NOT DETECTED Final   Streptococcus pneumoniae NOT DETECTED NOT DETECTED Final   Streptococcus pyogenes NOT DETECTED NOT DETECTED Final   A.calcoaceticus-baumannii NOT DETECTED NOT DETECTED Final   Bacteroides fragilis NOT DETECTED NOT DETECTED Final   Enterobacterales NOT DETECTED NOT DETECTED Final   Enterobacter cloacae complex NOT DETECTED NOT DETECTED Final   Escherichia coli NOT DETECTED NOT DETECTED Final   Klebsiella aerogenes NOT DETECTED NOT DETECTED Final   Klebsiella oxytoca NOT DETECTED NOT DETECTED Final   Klebsiella pneumoniae NOT DETECTED NOT DETECTED Final   Proteus species NOT DETECTED NOT DETECTED Final   Salmonella species NOT DETECTED NOT DETECTED Final   Serratia marcescens NOT DETECTED NOT DETECTED Final   Haemophilus influenzae NOT DETECTED NOT DETECTED Final   Neisseria meningitidis NOT DETECTED NOT DETECTED Final   Pseudomonas aeruginosa NOT DETECTED NOT DETECTED Final   Stenotrophomonas maltophilia NOT DETECTED NOT DETECTED Final   Candida albicans NOT DETECTED NOT DETECTED Final   Candida auris NOT DETECTED NOT DETECTED Final   Candida glabrata NOT DETECTED NOT DETECTED  Final   Candida krusei NOT DETECTED NOT DETECTED Final   Candida parapsilosis NOT DETECTED NOT DETECTED Final   Candida tropicalis NOT DETECTED NOT DETECTED Final   Cryptococcus neoformans/gattii NOT DETECTED NOT DETECTED Final    Comment: Performed at [facility], [address], [place], [place] [address]  Culture, blood (routine x 2)     Status: None (Preliminary result)   Collection Time: 09/15/23  1:26 AM   Specimen: BLOOD RIGHT HAND  Result Value Ref Range Status   Specimen Description BLOOD RIGHT HAND  Final   Special Requests   Final    BOTTLES DRAWN AEROBIC AND ANAEROBIC Blood Culture adequate volume   Culture   Final    NO GROWTH 3 DAYS Performed at [facility], [address], [place], [place] [address]  Report Status PENDING  Incomplete  Culture, blood (Routine X 2) w Reflex to ID Panel     Status: None (Preliminary result)   Collection Time: 09/16/23  9:25 AM   Specimen: BLOOD LEFT HAND  Result Value Ref Range Status   Specimen Description BLOOD LEFT HAND  Final   Special Requests   Final    BOTTLES DRAWN AEROBIC AND ANAEROBIC Blood Culture results may not be optimal due to an inadequate volume of blood received in culture bottles   Culture   Final    NO GROWTH 2 DAYS Performed at [facility], [address], [place], [place] [address]    Report Status PENDING  Incomplete  Culture, blood (Routine X 2) w Reflex to ID Panel     Status: None (Preliminary result)   Collection Time: 09/16/23  9:25 AM   Specimen: BLOOD LEFT HAND  Result Value Ref Range Status   Specimen Description BLOOD LEFT HAND  Final   Special Requests   Final    BOTTLES DRAWN AEROBIC AND ANAEROBIC Blood Culture results may not be optimal due to an inadequate volume of blood received in culture bottles   Culture   Final    NO GROWTH 2 DAYS Performed at [facility], [address] [place], [place] [address]    Report Status PENDING  Incomplete  Aerobic/Anaerobic  Culture w Gram Stain (surgical/deep wound)     Status: None (Preliminary result)   Collection Time: 09/16/23  5:18 PM   Specimen: Abscess  Result Value Ref Range Status   Specimen Description ABSCESS  Final   Special Requests NONE  Final   Gram Stain   Final    NO WBC SEEN NO ORGANISMS SEEN Performed at [facility], [address], [place], [place] [address]    Culture   Final    ABUNDANT STREPTOCOCCUS INTERMEDIUS NO ANAEROBES ISOLATED; CULTURE IN PROGRESS FOR 5 DAYS    Report Status PENDING  Incomplete     Marcos Eke, NP [facility]  09/18/2023  12:47 PM

## 2023-09-18 NOTE — Progress Notes (Signed)
      Chief Complaint/Subjective: No complaints. Ordering breakfast  Objective: Vital signs in last 24 hours: Temp:  [98.1 F (36.7 C)-98.6 F (37 C)] 98.1 F (36.7 C) (12/20 0844) Pulse Rate:  [59-66] 59 (12/20 0844) Resp:  [15-16] 16 (12/20 0844) BP: (115-122)/(50-89) 115/68 (12/20 0844) SpO2:  [97 %-100 %] 97 % (12/20 0844) Last BM Date : 09/16/23 Intake/Output from previous day: 12/19 0701 - 12/20 0700 In: 1675 [P.O.:1060; I.V.:5; IV Piggyback:600] Out: 290 [Drains:290]  PE: Gen: NAD Resp: nonlabored ORA Card: reg rate Abd: soft, NT, drain with serosanguinous output  Lab Results:  Recent Labs    09/17/23 0643 09/18/23 0700  WBC 15.9* 20.5*  HGB 13.1 12.8  HCT 38.1 36.4  PLT 205 260   Recent Labs    09/17/23 0643 09/18/23 0700  NA 135 135  K 3.6 3.6  CL 107 104  CO2 21* 22  GLUCOSE 128* 98  BUN 25* 21  CREATININE 0.93 0.83  CALCIUM 8.1* 8.5*   Recent Labs    09/16/23 1526  LABPROT 16.3*  INR 1.3*      Component Value Date/Time   NA 135 09/18/2023 0700   K 3.6 09/18/2023 0700   CL 104 09/18/2023 0700   CO2 22 09/18/2023 0700   GLUCOSE 98 09/18/2023 0700   BUN 21 09/18/2023 0700   CREATININE 0.83 09/18/2023 0700   CALCIUM 8.5 (L) 09/18/2023 0700   PROT 5.8 (L) 09/18/2023 0700   ALBUMIN 1.9 (L) 09/18/2023 0700   AST 40 09/18/2023 0700   ALT 70 (H) 09/18/2023 0700   ALKPHOS 88 09/18/2023 0700   BILITOT 0.8 09/18/2023 0700   GFRNONAA >60 09/18/2023 0700    Assessment/Plan Hepatic abscess s/p IR drain Diverticulitis  - WBC 20.5 on abx and ID consulted - no acute surgical intervention indicated. General surgery will remain available as needed - last colonoscopy was 2023 - recommend follow up with GI after discharge for planning of repeat colonoscopy.  FEN - reg diet VTE - lovenox 40 mg daily ID - flagyl, ceftriaxone, cultures pending    LOS: 3 days   I reviewed last 24 h vitals and pain scores, last 48 h intake and output, last 24  h labs and trends, and last 24 h imaging results.    Eric Form [facility] 09/18/2023, 10:26 AM Please see Amion for pager number during day hours 7:00am-4:30pm or 7:00am -11:30am on weekends

## 2023-09-18 NOTE — TOC Progression Note (Signed)
Transition of Care [facility]) - Progression Note    Patient Details  Name: Ashley Galloway MRN: [id number] Date of Birth: 1951-02-14  Transition of Care [facility]) CM/SW Contact  Huston Foley Jacklynn Ganong, RN Phone Number: 09/18/2023, 2:54 PM  Clinical Narrative:    Patient has had PICC line placed, [facility] will provide [facility] for IV antibiotics and drain care. Patient may need TEE prior to discharge. TOC Team will continue to follow.    Expected Discharge Plan: Home w Home Health Services Barriers to Discharge: Continued Medical Work up  Expected Discharge Plan and Services In-house Referral: NA Discharge Planning Services: CM Consult Post Acute Care Choice: Home Health Living arrangements for the past 2 months: Single Family Home                           HH Arranged: RN HH Agency: [facility] Date [facility] Agency Contacted: 09/18/23 Time HH Agency Contacted: [phone] Representative spoke with at [facility] Agency: Lorenza Chick   Social Determinants of Health (SDOH) Interventions SDOH Screenings   Food Insecurity: No Food Insecurity (09/15/2023)  Housing: Patient Declined (09/15/2023)  Transportation Needs: No Transportation Needs (09/15/2023)  Utilities: Not At Risk (09/15/2023)  Tobacco Use: High Risk (09/16/2023)    Readmission Risk Interventions     No data to display

## 2023-09-18 NOTE — TOC Progression Note (Signed)
Transition of Care [facility]) - Progression Note    Patient Details  Name: Ashley Galloway MRN: [id number] Date of Birth: 03-Dec-1950  Transition of Care [facility]) CM/SW Contact  Huston Foley Jacklynn Ganong, RN Phone Number: 09/18/2023, 11:28 AM  Clinical Narrative:    Case Manager spoke with patient concerning plan for PICC line placement and IV antibiotics for home. Patient states Drs. Just told her about it. She has no preference for [facility] agency, CM spoke with Jeri Modena, RN IV antibiotic Specialist with [facility], she will follow up with patient and answer questions as well as do education. Patient states that she is very pleased with her care during this hospitalization. TOC Team will continue to follow.   Expected Discharge Plan: Home w Home Health Services Barriers to Discharge: Continued Medical Work up  Expected Discharge Plan and Services In-house Referral: NA Discharge Planning Services: CM Consult Post Acute Care Choice: Home Health Living arrangements for the past 2 months: Single Family Home                           HH Arranged: IV Antibiotics HH Agency: [facility] Date HH Agency Contacted: 09/18/23 Time HH Agency Contacted: 1125 Representative spoke with at [facility] Agency: Jeri Modena   Social Determinants of Health (SDOH) Interventions SDOH Screenings   Food Insecurity: No Food Insecurity (09/15/2023)  Housing: Patient Declined (09/15/2023)  Transportation Needs: No Transportation Needs (09/15/2023)  Utilities: Not At Risk (09/15/2023)  Tobacco Use: High Risk (09/16/2023)    Readmission Risk Interventions     No data to display

## 2023-09-18 NOTE — Progress Notes (Incomplete)
PHARMACY CONSULT NOTE FOR:  OUTPATIENT  PARENTERAL ANTIBIOTIC THERAPY (OPAT)  Indication:  Regimen:  End date:   IV antibiotic discharge orders are pended. To discharging provider:  please sign these orders via discharge navigator,  Select New Orders & click on the button choice - Manage This Unsigned Work.     Thank you for allowing pharmacy to be a part of this patient's care.  Rolley Sims 09/18/2023, 11:06 AM

## 2023-09-19 ENCOUNTER — Other Ambulatory Visit (HOSPITAL_COMMUNITY): Payer: Self-pay

## 2023-09-19 DIAGNOSIS — R7881 Bacteremia: Secondary | ICD-10-CM | POA: Diagnosis not present

## 2023-09-19 DIAGNOSIS — K5792 Diverticulitis of intestine, part unspecified, without perforation or abscess without bleeding: Secondary | ICD-10-CM | POA: Diagnosis not present

## 2023-09-19 DIAGNOSIS — B955 Unspecified streptococcus as the cause of diseases classified elsewhere: Secondary | ICD-10-CM | POA: Diagnosis not present

## 2023-09-19 DIAGNOSIS — K750 Abscess of liver: Secondary | ICD-10-CM | POA: Diagnosis not present

## 2023-09-19 LAB — CBC WITH DIFFERENTIAL/PLATELET
Abs Immature Granulocytes: 0 10*3/uL (ref 0.00–0.07)
Basophils Absolute: 0 10*3/uL (ref 0.0–0.1)
Basophils Relative: 0 %
Eosinophils Absolute: 0.2 10*3/uL (ref 0.0–0.5)
Eosinophils Relative: 1 %
HCT: 32.2 % — ABNORMAL LOW (ref 36.0–46.0)
Hemoglobin: 10.9 g/dL — ABNORMAL LOW (ref 12.0–15.0)
Lymphocytes Relative: 2 %
Lymphs Abs: 0.5 10*3/uL — ABNORMAL LOW (ref 0.7–4.0)
MCH: 29.6 pg (ref 26.0–34.0)
MCHC: 33.9 g/dL (ref 30.0–36.0)
MCV: 87.5 fL (ref 80.0–100.0)
Monocytes Absolute: 1.5 10*3/uL — ABNORMAL HIGH (ref 0.1–1.0)
Monocytes Relative: 6 %
Neutro Abs: 22.2 10*3/uL — ABNORMAL HIGH (ref 1.7–7.7)
Neutrophils Relative %: 91 %
Platelets: 281 10*3/uL (ref 150–400)
RBC: 3.68 MIL/uL — ABNORMAL LOW (ref 3.87–5.11)
RDW: 14.5 % (ref 11.5–15.5)
WBC: 24.4 10*3/uL — ABNORMAL HIGH (ref 4.0–10.5)
nRBC: 0 % (ref 0.0–0.2)
nRBC: 0 /100{WBCs}

## 2023-09-19 LAB — CULTURE, BLOOD (ROUTINE X 2)

## 2023-09-19 LAB — COMPREHENSIVE METABOLIC PANEL
ALT: 45 U/L — ABNORMAL HIGH (ref 0–44)
AST: 28 U/L (ref 15–41)
Albumin: 1.6 g/dL — ABNORMAL LOW (ref 3.5–5.0)
Alkaline Phosphatase: 68 U/L (ref 38–126)
Anion gap: 7 (ref 5–15)
BUN: 20 mg/dL (ref 8–23)
CO2: 23 mmol/L (ref 22–32)
Calcium: 8.1 mg/dL — ABNORMAL LOW (ref 8.9–10.3)
Chloride: 106 mmol/L (ref 98–111)
Creatinine, Ser: 0.84 mg/dL (ref 0.44–1.00)
GFR, Estimated: >60 mL/min (ref >60)
Glucose, Bld: 100 mg/dL — ABNORMAL HIGH (ref 70–99)
Potassium: 3.5 mmol/L (ref 3.5–5.1)
Sodium: 136 mmol/L (ref 135–145)
Total Bilirubin: 0.6 mg/dL (ref <1.2)
Total Protein: 5.1 g/dL — ABNORMAL LOW (ref 6.5–8.1)

## 2023-09-19 LAB — MAGNESIUM: Magnesium: 1.8 mg/dL (ref 1.7–2.4)

## 2023-09-19 MED ORDER — CEFTRIAXONE IV (FOR PTA / DISCHARGE USE ONLY): 2.0000 g | INTRAVENOUS | 0 refills | Status: DC

## 2023-09-19 MED ORDER — CEFTRIAXONE IV (FOR PTA / DISCHARGE USE ONLY): 2.0000 g | INTRAVENOUS | 0 refills | Status: AC

## 2023-09-19 MED ORDER — METRONIDAZOLE 500 MG PO TABS
500.0000 mg | ORAL_TABLET | Freq: Two times a day (BID) | ORAL | 0 refills | Status: AC
Start: 2023-09-19 — End: 2023-09-30
  Filled 2023-09-19: qty 22, 11d supply, fill #0

## 2023-09-19 MED ORDER — HEPARIN SOD (PORK) LOCK FLUSH 100 UNIT/ML IV SOLN
250.0000 [IU] | INTRAVENOUS | Status: AC | PRN
  Administered 2023-09-19: 250 [IU]
  Filled 2023-09-19: qty 2.5

## 2023-09-19 MED ORDER — SODIUM CHLORIDE 0.9 % IV SOLN
2.0000 g | INTRAVENOUS | Status: DC
  Administered 2023-09-19: 2 g via INTRAVENOUS
  Filled 2023-09-19: qty 20

## 2023-09-19 NOTE — Discharge Summary (Signed)
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 Yes; Fluid consistency: Thin  Diet effective now       Question Answer Comment  Room service appropriate? Yes   Fluid consistency: Thin      09/17/23 1004            Hospital Course: Ashley Galloway is a 72 year old female admitted with weakness and abdominal discomfort.  She was also complaining of some lower back pain radiating down her right leg. CT abdomen/pelvis showed concern for hepatic abscess measuring 11 x 8.7 x 12.4 cm.  Also concerning for diverticulitis involving descending and sigmoid colon. General surgery was consulted and she was started on antibiotics. MRI liver also obtained after admission showing underlying hepatic abscess as seen on CT. MRI lumbar spine showed underlying stenosis from approximately L3-S1, worse in L4-L5. She underwent drain placement with IR to the hepatic abscess on 09/16/2023.  Blood cultures and abscess drainage both grew Streptococcus intermedius.  She was evaluated by ID as well.  She was discharged with PICC line in place and 4-week course of Rocephin; 2 weeks total of flagyl.   Assessment and Plan: * Hepatic abscess - per CT: " Interval development of a multiloculated, heavily septated collection within the central aspects  of segment 4-8 extending to the biliary hilum most in keeping with a developing hepatic abscess measuring 11.0 x 8.7 x 12.4 cm in greatest dimension" - continue rocephin and flagyl; awaiting final sensitivities - suspect the strep bacteremia is translocated from hepatic abscess as well; fluid culture from abscess also growing strep intermedius -General Surgery following, appreciate assistance - patient underwent IR drain placement on 12/18; follow up fluid culture = Strep intermedius  - ID consulted given overall complexity and rec's for course length etc -Tentative plan for 4-week course of Rocephin per ID.  PICC line placed 09/18/2023  Streptococcal bacteremia - suspect the strep bacteremia is translocated from hepatic abscess; both growing strep intermedius - growing Strep intermedius in 2/2 bottles from 12/16 - 12/17 and 12/18 remain negative  -TTE recommended per ID, unfortunately no echo avail over weekend; so as to not delay discharge, discussed with ID and okay for outpatient echo  Diverticulitis - mild and noted on CT A/P as well on admission - diet advanced slowly; can continue low fiber diet on d/c - continue flagyl for 2 weeks total; will also be on rocephin as per above   Lumbar stenosis - Patient was endorsing right leg neuropathic pains at times; improved rapidly after admission with no further complaints; ambulating well  - MRI L-spine confirms underlying stenosis worse at L4-5 but involving L3-S1 -Supportive treatment for now; can have referral outpatient to neurosurgery after resolution of acute infection   The patient's acute and chronic medical conditions were treated accordingly. On day of discharge, patient was felt deemed stable for discharge. Patient/family  member advised to call PCP or come back to ER if needed.   Principal Diagnosis: Hepatic abscess  Discharge Diagnoses: Active Hospital Problems   Diagnosis Date Noted   Hepatic abscess 09/14/2023     Priority: 1.   Streptococcal bacteremia 09/16/2023    Priority: 2.   Diverticulitis 09/15/2023    Priority: 2.   Lumbar stenosis 09/15/2023    Priority: 3.    Resolved Hospital Problems  No resolved problems to display.     Discharge Instructions     [facility] pharmacist to adjust dose for Vancomycin, Aminoglycosides and other anti-infective therapies as requested by physician.   Complete by: As directed    [facility] to provide Cath Flo 2mg    Complete by: As directed    Administer for PICC line occlusion and as ordered by physician for other access device issues.   Anaphylaxis Kit: Provided to treat any anaphylactic reaction to the medication being provided to the patient if First Dose or when requested by physician   Complete by: As directed    Epinephrine 1mg /ml vial / amp: Administer 0.3mg  (0.24ml) subcutaneously once for moderate to severe anaphylaxis, nurse to call physician and pharmacy when reaction occurs and call 911 if needed for immediate care   Diphenhydramine 50mg /ml IV vial: Administer 25-50mg  IV/IM PRN for first dose reaction, rash, itching, mild reaction, nurse to call physician and pharmacy when reaction occurs   Sodium Chloride 0.9% NS IV: Administer if needed for hypovolemic blood pressure drop or as ordered by physician after call to physician with anaphylactic reaction   Change dressing on IV access line weekly and PRN   Complete by: As directed    Discharge wound care:   Complete by: As directed    Continue drain care as per radiology instructions   Flush IV access with Sodium Chloride 0.9% and Heparin 10 units/ml or 100 units/ml   Complete by: As directed    Home infusion instructions - [facility]   Complete by: As directed    Instructions: Flush IV access with Sodium Chloride 0.9% and Heparin 10units/ml or 100units/ml   Change dressing on IV access line: Weekly and PRN   Instructions Cath Flo 2mg : Administer for  PICC Line occlusion and as ordered by physician for other access device   [facility] pharmacist to adjust dose for: Vancomycin, Aminoglycosides and other anti-infective therapies as requested by physician   Increase activity slowly   Complete by: As directed    Method of administration may be changed at the discretion of home infusion pharmacist based upon assessment of the patient and/or caregiver's ability to self-administer the medication ordered   Complete by: As directed       Allergies as of 09/19/2023       Reactions   Tape    Tape from hormone patch.        Medication List     TAKE these medications    aspirin EC 81 MG tablet Take 81 mg by mouth daily. Swallow whole.   cefTRIAXone IVPB Commonly known as: ROCEPHIN Inject 2 g into the vein daily for 25 days. Indication:  hepatic abscess First Dose Received Inpatient: Yes Last Day of Therapy:  10/14/2023 Labs - Once weekly:  CBC/D and BMP, Labs - Once weekly: ESR and CRP Method of administration: IV Push Method of administration may be changed at the discretion of home infusion pharmacist based upon assessment of the patient and/or caregiver's ability to self-administer  the medication ordered. Please leave PIC in place until doctor has seen patient or been notified   Centrum Silver Women 50+ Tabs Take 1 tablet by mouth daily.   Klor-Con M20 20 MEQ tablet Generic drug: potassium chloride SA Take 20 mEq by mouth daily.   latanoprost 0.005 % ophthalmic solution Commonly known as: XALATAN Place 1 drop into both eyes at bedtime.   lovastatin 20 MG tablet Commonly known as: MEVACOR Take 20 mg by mouth daily.   metroNIDAZOLE 500 MG tablet Commonly known as: Flagyl Take 1 tablet (500 mg total) by mouth 2 (two) times daily for 11 days.   timolol 0.5 % ophthalmic solution Commonly known as: TIMOPTIC Place 1 drop into both eyes 2 (two) times daily.   traZODone 50 MG tablet Commonly known as:  DESYREL Take 1-2 tablets by mouth at bedtime.   triamterene-hydrochlorothiazide 37.5-25 MG capsule Commonly known as: DYAZIDE Take 1 capsule by mouth every morning.               Discharge Care Instructions  (From admission, onward)           Start     Ordered   09/19/23 0000  Change dressing on IV access line weekly and PRN  (Home infusion instructions - [facility] )        09/19/23 1206   09/19/23 0000  Discharge wound care:       Comments: Continue drain care as per radiology instructions   09/19/23 1441            Follow-up Information     Ashley Dills, MD. Schedule an appointment as soon as possible for a visit in 1 week(s).   Specialty: Internal Medicine Why: Needs 2d echo in setting of bacteremia. Unavail echo prior to discharge Contact information: [address] [place] [address] [phone]                Allergies  Allergen Reactions   Tape     Tape from hormone patch.    Consultations: ID General surgery IR  Procedures: 09/16/2023: Hepatic abscess drainage with drain placement  Discharge Exam: BP (!) 98/45 (BP Location: Left Arm)   Pulse (!) 59   Temp 97.9 F (36.6 C) (Oral)   Resp 17   Ht 5\' 7"  (1.702 m)   Wt 84.4 kg   SpO2 100%   BMI 29.14 kg/m  Physical Exam Constitutional:      Appearance: Normal appearance.  HENT:     Head: Normocephalic and atraumatic.     Mouth/Throat:     Mouth: Mucous membranes are moist.  Eyes:     Extraocular Movements: Extraocular movements intact.  Cardiovascular:     Rate and Rhythm: Normal rate and regular rhythm.  Pulmonary:     Effort: Pulmonary effort is normal. No respiratory distress.     Breath sounds: Normal breath sounds. No wheezing.  Abdominal:     General: Bowel sounds are normal. There is no distension.     Palpations: Abdomen is soft.     Tenderness: There is no abdominal tenderness.     Comments: Right abd drain in place with  serosang fluid in bulb  Musculoskeletal:        General: Normal range of motion.     Cervical back: Normal range of motion and neck supple.  Skin:    General: Skin is warm and dry.  Neurological:     General: No focal deficit present.  Mental Status: She is alert.  Psychiatric:        Mood and Affect: Mood normal.      The results of significant diagnostics from this hospitalization (including imaging, microbiology, ancillary and laboratory) are listed below for reference.   Microbiology: Recent Results (from the past 240 hours)  Resp panel by RT-PCR (RSV, Flu A&B, Covid) Urine, Clean Catch     Status: None   Collection Time: 09/14/23  6:56 PM   Specimen: Urine, Clean Catch; Nasal Swab  Result Value Ref Range Status   SARS Coronavirus 2 by RT PCR NEGATIVE NEGATIVE Final    Comment: (NOTE) SARS-CoV-2 target nucleic acids are NOT DETECTED.  The SARS-CoV-2 RNA is generally detectable in upper respiratory specimens during the acute phase of infection. The lowest concentration of SARS-CoV-2 viral copies this assay can detect is 138 copies/mL. A negative result does not preclude SARS-Cov-2 infection and should not be used as the sole basis for treatment or other patient management decisions. A negative result may occur with  improper specimen collection/handling, submission of specimen other than nasopharyngeal swab, presence of viral mutation(s) within the areas targeted by this assay, and inadequate number of viral copies(<138 copies/mL). A negative result must be combined with clinical observations, patient history, and epidemiological information. The expected result is Negative.  Fact Sheet for Patients:  BloggerCourse.com  Fact Sheet for Healthcare Providers:  SeriousBroker.it  This test is no t yet approved or cleared by the [place] FDA and  has been authorized for detection and/or diagnosis of SARS-CoV-2 by FDA  under an Emergency Use Authorization (EUA). This EUA will remain  in effect (meaning this test can be used) for the duration of the COVID-19 declaration under Section 564(b)(1) of the Act, 21 U.S.C.section 360bbb-3(b)(1), unless the authorization is terminated  or revoked sooner.       Influenza A by PCR NEGATIVE NEGATIVE Final   Influenza B by PCR NEGATIVE NEGATIVE Final    Comment: (NOTE) The Xpert Xpress SARS-CoV-2/FLU/RSV plus assay is intended as an aid in the diagnosis of influenza from Nasopharyngeal swab specimens and should not be used as a sole basis for treatment. Nasal washings and aspirates are unacceptable for Xpert Xpress SARS-CoV-2/FLU/RSV testing.  Fact Sheet for Patients: BloggerCourse.com  Fact Sheet for Healthcare Providers: SeriousBroker.it  This test is not yet approved or cleared by the [place] FDA and has been authorized for detection and/or diagnosis of SARS-CoV-2 by FDA under an Emergency Use Authorization (EUA). This EUA will remain in effect (meaning this test can be used) for the duration of the COVID-19 declaration under Section 564(b)(1) of the Act, 21 U.S.C. section 360bbb-3(b)(1), unless the authorization is terminated or revoked.     Resp Syncytial Virus by PCR NEGATIVE NEGATIVE Final    Comment: (NOTE) Fact Sheet for Patients: BloggerCourse.com  Fact Sheet for Healthcare Providers: SeriousBroker.it  This test is not yet approved or cleared by the [place] FDA and has been authorized for detection and/or diagnosis of SARS-CoV-2 by FDA under an Emergency Use Authorization (EUA). This EUA will remain in effect (meaning this test can be used) for the duration of the COVID-19 declaration under Section 564(b)(1) of the Act, 21 U.S.C. section 360bbb-3(b)(1), unless the authorization is terminated or revoked.  Performed at [facility], [address] [place], [place] [address]   Culture, blood (routine x 2)     Status: Abnormal   Collection Time: 09/14/23  9:27 PM   Specimen: BLOOD  Result Value Ref Range Status   Specimen Description BLOOD RIGHT ANTECUBITAL  Final   Special Requests   Final    BOTTLES DRAWN AEROBIC AND ANAEROBIC Blood Culture results may not be optimal due to an inadequate volume of blood received in culture bottles   Culture  Setup Time   Final    GRAM POSITIVE COCCI IN PAIRS IN CHAINS IN BOTH AEROBIC AND ANAEROBIC BOTTLES CRITICAL RESULT CALLED TO, READ BACK BY AND VERIFIED WITH: PHARMD A. PAYTES 09/16/2023 @ 0650 BY AB Performed at [facility], [address], [place], [place] [address]    Culture STREPTOCOCCUS INTERMEDIUS (A)  Final   Report Status 09/19/2023 FINAL  Final   Organism ID, Bacteria STREPTOCOCCUS INTERMEDIUS  Final      Susceptibility   Streptococcus intermedius - MIC*    PENICILLIN <=0.06 SENSITIVE Sensitive     CEFTRIAXONE <=0.12 SENSITIVE Sensitive     ERYTHROMYCIN <=0.12 SENSITIVE Sensitive     LEVOFLOXACIN 1 SENSITIVE Sensitive     VANCOMYCIN 0.25 SENSITIVE Sensitive     * STREPTOCOCCUS INTERMEDIUS  Blood Culture ID Panel (Reflexed)     Status: Abnormal   Collection Time: 09/14/23  9:27 PM  Result Value Ref Range Status   Enterococcus faecalis NOT DETECTED NOT DETECTED Final   Enterococcus Faecium NOT DETECTED NOT DETECTED Final   Listeria monocytogenes NOT DETECTED NOT DETECTED Final   Staphylococcus species NOT DETECTED NOT DETECTED Final   Staphylococcus aureus (BCID) NOT DETECTED NOT DETECTED Final   Staphylococcus epidermidis NOT DETECTED NOT DETECTED Final   Staphylococcus lugdunensis NOT DETECTED NOT DETECTED Final   Streptococcus species DETECTED (A) NOT DETECTED Final    Comment: Not Enterococcus species, Streptococcus agalactiae, Streptococcus pyogenes, or Streptococcus pneumoniae. CRITICAL RESULT CALLED TO, READ BACK BY AND  VERIFIED WITH: PHARMD A. PAYTES 09/16/2023 @ 0650 BY AB    Streptococcus agalactiae NOT DETECTED NOT DETECTED Final   Streptococcus pneumoniae NOT DETECTED NOT DETECTED Final   Streptococcus pyogenes NOT DETECTED NOT DETECTED Final   A.calcoaceticus-baumannii NOT DETECTED NOT DETECTED Final   Bacteroides fragilis NOT DETECTED NOT DETECTED Final   Enterobacterales NOT DETECTED NOT DETECTED Final   Enterobacter cloacae complex NOT DETECTED NOT DETECTED Final   Escherichia coli NOT DETECTED NOT DETECTED Final   Klebsiella aerogenes NOT DETECTED NOT DETECTED Final   Klebsiella oxytoca NOT DETECTED NOT DETECTED Final   Klebsiella pneumoniae NOT DETECTED NOT DETECTED Final   Proteus species NOT DETECTED NOT DETECTED Final   Salmonella species NOT DETECTED NOT DETECTED Final   Serratia marcescens NOT DETECTED NOT DETECTED Final   Haemophilus influenzae NOT DETECTED NOT DETECTED Final   Neisseria meningitidis NOT DETECTED NOT DETECTED Final   Pseudomonas aeruginosa NOT DETECTED NOT DETECTED Final   Stenotrophomonas maltophilia NOT DETECTED NOT DETECTED Final   Candida albicans NOT DETECTED NOT DETECTED Final   Candida auris NOT DETECTED NOT DETECTED Final   Candida glabrata NOT DETECTED NOT DETECTED Final   Candida krusei NOT DETECTED NOT DETECTED Final   Candida parapsilosis NOT DETECTED NOT DETECTED Final   Candida tropicalis NOT DETECTED NOT DETECTED Final   Cryptococcus neoformans/gattii NOT DETECTED NOT DETECTED Final    Comment: Performed at [facility], [address], [place], [place] [address]  Culture, blood (routine x 2)     Status: None (Preliminary result)   Collection Time: 09/15/23  1:26 AM   Specimen: BLOOD RIGHT HAND  Result Value Ref Range Status   Specimen Description BLOOD  RIGHT HAND  Final   Special Requests   Final    BOTTLES DRAWN AEROBIC AND ANAEROBIC Blood Culture adequate volume   Culture   Final    NO GROWTH 4 DAYS Performed at [facility],  [address], [place], [place] [address]    Report Status PENDING  Incomplete  Culture, blood (Routine X 2) w Reflex to ID Panel     Status: None (Preliminary result)   Collection Time: 09/16/23  9:25 AM   Specimen: BLOOD LEFT HAND  Result Value Ref Range Status   Specimen Description BLOOD LEFT HAND  Final   Special Requests   Final    BOTTLES DRAWN AEROBIC AND ANAEROBIC Blood Culture results may not be optimal due to an inadequate volume of blood received in culture bottles   Culture   Final    NO GROWTH 3 DAYS Performed at [facility], [address], [place], [place] [address]    Report Status PENDING  Incomplete  Culture, blood (Routine X 2) w Reflex to ID Panel     Status: None (Preliminary result)   Collection Time: 09/16/23  9:25 AM   Specimen: BLOOD LEFT HAND  Result Value Ref Range Status   Specimen Description BLOOD LEFT HAND  Final   Special Requests   Final    BOTTLES DRAWN AEROBIC AND ANAEROBIC Blood Culture results may not be optimal due to an inadequate volume of blood received in culture bottles   Culture   Final    NO GROWTH 3 DAYS Performed at [facility], [address] [place], [place] [address]    Report Status PENDING  Incomplete  Aerobic/Anaerobic Culture w Gram Stain (surgical/deep wound)     Status: None (Preliminary result)   Collection Time: 09/16/23  5:18 PM   Specimen: Abscess  Result Value Ref Range Status   Specimen Description ABSCESS  Final   Special Requests NONE  Final   Gram Stain NO WBC SEEN NO ORGANISMS SEEN   Final   Culture   Final    ABUNDANT STREPTOCOCCUS INTERMEDIUS NO ANAEROBES ISOLATED; CULTURE IN PROGRESS FOR 5 DAYS    Report Status PENDING  Incomplete   Organism ID, Bacteria STREPTOCOCCUS INTERMEDIUS  Final      Susceptibility   Streptococcus intermedius - MIC*    PENICILLIN <=0.06 SENSITIVE Sensitive     CEFTRIAXONE <=0.12 SENSITIVE Sensitive     ERYTHROMYCIN <=0.12 SENSITIVE Sensitive     LEVOFLOXACIN 1  SENSITIVE Sensitive     VANCOMYCIN Value in next row Sensitive      0.25 SENSITIVEPerformed at [facility], [address], [place], [place] [address]    * ABUNDANT STREPTOCOCCUS INTERMEDIUS     Labs: BNP (last 3 results) No results for input(s): "BNP" in the last 8760 hours. Basic Metabolic Panel: Recent Labs  Lab 09/15/23 0621 09/16/23 0653 09/17/23 0643 09/18/23 0700 09/19/23 0129  NA 133* 135 135 135 136  K 3.3* 3.2* 3.6 3.6 3.5  CL 102 106 107 104 106  CO2 20* 23 21* 22 23  GLUCOSE 116* 132* 128* 98 100*  BUN 29* 24* 25* 21 20  CREATININE 0.96 0.88 0.93 0.83 0.84  CALCIUM 7.8* 7.9* 8.1* 8.5* 8.1*  MG  --  2.4 2.4 2.0 1.8   Liver Function Tests: Recent Labs  Lab 09/15/23 0621 09/16/23 0653 09/17/23 0643 09/18/23 0700 09/19/23 0129  AST 201* 161* 79* 40 28  ALT 171* 156* 112* 70*  45*  ALKPHOS 91 94 93 88 68  BILITOT 1.0 0.7 0.7 0.8 0.6  PROT 5.7* 5.6* 6.1* 5.8* 5.1*  ALBUMIN 1.9* 1.9* 2.0* 1.9* 1.6*   Recent Labs  Lab 09/14/23 1638  LIPASE 24   No results for input(s): "AMMONIA" in the last 168 hours. CBC: Recent Labs  Lab 09/15/23 0621 09/16/23 0653 09/17/23 0643 09/18/23 0700 09/19/23 0129  WBC 12.1* 14.5* 15.9* 20.5* 24.4*  NEUTROABS  --  12.8* 13.7* 17.1* 22.2*  HGB 10.5* 10.6* 13.1 12.8 10.9*  HCT 30.5* 31.1* 38.1 36.4 32.2*  MCV 86.2 86.1 85.6 85.6 87.5  PLT 169 177 205 260 281   Cardiac Enzymes: No results for input(s): "CKTOTAL", "CKMB", "CKMBINDEX", "TROPONINI" in the last 168 hours. BNP: Invalid input(s): "POCBNP" CBG: Recent Labs  Lab 09/15/23 0852 09/18/23 0655  GLUCAP 108* 100*   D-Dimer No results for input(s): "DDIMER" in the last 72 hours. Hgb A1c No results for input(s): "HGBA1C" in the last 72 hours. Lipid Profile No results for input(s): "CHOL", "HDL", "LDLCALC", "TRIG", "CHOLHDL", "LDLDIRECT" in the last 72 hours. Thyroid function studies No results for input(s): "TSH", "T4TOTAL", "T3FREE", "THYROIDAB" in  the last 72 hours.  Invalid input(s): "FREET3" Anemia work up No results for input(s): "VITAMINB12", "FOLATE", "FERRITIN", "TIBC", "IRON", "RETICCTPCT" in the last 72 hours. Urinalysis    Component Value Date/Time   COLORURINE ORANGE (A) 09/14/2023 1638   APPEARANCEUR HAZY (A) 09/14/2023 1638   LABSPEC 1.025 09/14/2023 1638   PHURINE 6.0 09/14/2023 1638   GLUCOSEU NEGATIVE 09/14/2023 1638   HGBUR MODERATE (A) 09/14/2023 1638   BILIRUBINUR NEGATIVE 09/14/2023 1638   KETONESUR NEGATIVE 09/14/2023 1638   PROTEINUR 100 (A) 09/14/2023 1638   NITRITE NEGATIVE 09/14/2023 1638   LEUKOCYTESUR NEGATIVE 09/14/2023 1638   Sepsis Labs Recent Labs  Lab 09/16/23 0653 09/17/23 0643 09/18/23 0700 09/19/23 0129  WBC 14.5* 15.9* 20.5* 24.4*   Microbiology Recent Results (from the past 240 hours)  Resp panel by RT-PCR (RSV, Flu A&B, Covid) Urine, Clean Catch     Status: None   Collection Time: 09/14/23  6:56 PM   Specimen: Urine, Clean Catch; Nasal Swab  Result Value Ref Range Status   SARS Coronavirus 2 by RT PCR NEGATIVE NEGATIVE Final    Comment: (NOTE) SARS-CoV-2 target nucleic acids are NOT DETECTED.  The SARS-CoV-2 RNA is generally detectable in upper respiratory specimens during the acute phase of infection. The lowest concentration of SARS-CoV-2 viral copies this assay can detect is 138 copies/mL. A negative result does not preclude SARS-Cov-2 infection and should not be used as the sole basis for treatment or other patient management decisions. A negative result may occur with  improper specimen collection/handling, submission of specimen other than nasopharyngeal swab, presence of viral mutation(s) within the areas targeted by this assay, and inadequate number of viral copies(<138 copies/mL). A negative result must be combined with clinical observations, patient history, and epidemiological information. The expected result is Negative.  Fact Sheet for Patients:   BloggerCourse.com  Fact Sheet for Healthcare Providers:  SeriousBroker.it  This test is no t yet approved or cleared by the [place] FDA and  has been authorized for detection and/or diagnosis of SARS-CoV-2 by FDA under an Emergency Use Authorization (EUA). This EUA will remain  in effect (meaning this test can be used) for the duration of the COVID-19 declaration under Section 564(b)(1) of the Act, 21 U.S.C.section 360bbb-3(b)(1), unless the authorization is terminated  or revoked sooner.  Influenza A by PCR NEGATIVE NEGATIVE Final   Influenza B by PCR NEGATIVE NEGATIVE Final    Comment: (NOTE) The Xpert Xpress SARS-CoV-2/FLU/RSV plus assay is intended as an aid in the diagnosis of influenza from Nasopharyngeal swab specimens and should not be used as a sole basis for treatment. Nasal washings and aspirates are unacceptable for Xpert Xpress SARS-CoV-2/FLU/RSV testing.  Fact Sheet for Patients: BloggerCourse.com  Fact Sheet for Healthcare Providers: SeriousBroker.it  This test is not yet approved or cleared by the [place] FDA and has been authorized for detection and/or diagnosis of SARS-CoV-2 by FDA under an Emergency Use Authorization (EUA). This EUA will remain in effect (meaning this test can be used) for the duration of the COVID-19 declaration under Section 564(b)(1) of the Act, 21 U.S.C. section 360bbb-3(b)(1), unless the authorization is terminated or revoked.     Resp Syncytial Virus by PCR NEGATIVE NEGATIVE Final    Comment: (NOTE) Fact Sheet for Patients: BloggerCourse.com  Fact Sheet for Healthcare Providers: SeriousBroker.it  This test is not yet approved or cleared by the [place] FDA and has been authorized for detection and/or diagnosis of SARS-CoV-2 by FDA under an Emergency Use  Authorization (EUA). This EUA will remain in effect (meaning this test can be used) for the duration of the COVID-19 declaration under Section 564(b)(1) of the Act, 21 U.S.C. section 360bbb-3(b)(1), unless the authorization is terminated or revoked.  Performed at [facility], [address], [place], [place] [address]   Culture, blood (routine x 2)     Status: Abnormal   Collection Time: 09/14/23  9:27 PM   Specimen: BLOOD  Result Value Ref Range Status   Specimen Description BLOOD RIGHT ANTECUBITAL  Final   Special Requests   Final    BOTTLES DRAWN AEROBIC AND ANAEROBIC Blood Culture results may not be optimal due to an inadequate volume of blood received in culture bottles   Culture  Setup Time   Final    GRAM POSITIVE COCCI IN PAIRS IN CHAINS IN BOTH AEROBIC AND ANAEROBIC BOTTLES CRITICAL RESULT CALLED TO, READ BACK BY AND VERIFIED WITH: PHARMD A. PAYTES 09/16/2023 @ 0650 BY AB Performed at [facility], [address] [place], [place] [address]    Culture STREPTOCOCCUS INTERMEDIUS (A)  Final   Report Status 09/19/2023 FINAL  Final   Organism ID, Bacteria STREPTOCOCCUS INTERMEDIUS  Final      Susceptibility   Streptococcus intermedius - MIC*    PENICILLIN <=0.06 SENSITIVE Sensitive     CEFTRIAXONE <=0.12 SENSITIVE Sensitive     ERYTHROMYCIN <=0.12 SENSITIVE Sensitive     LEVOFLOXACIN 1 SENSITIVE Sensitive     VANCOMYCIN 0.25 SENSITIVE Sensitive     * STREPTOCOCCUS INTERMEDIUS  Blood Culture ID Panel (Reflexed)     Status: Abnormal   Collection Time: 09/14/23  9:27 PM  Result Value Ref Range Status   Enterococcus faecalis NOT DETECTED NOT DETECTED Final   Enterococcus Faecium NOT DETECTED NOT DETECTED Final   Listeria monocytogenes NOT DETECTED NOT DETECTED Final   Staphylococcus species NOT DETECTED NOT DETECTED Final   Staphylococcus aureus (BCID) NOT DETECTED NOT DETECTED Final   Staphylococcus epidermidis NOT DETECTED NOT DETECTED Final    Staphylococcus lugdunensis NOT DETECTED NOT DETECTED Final   Streptococcus species DETECTED (A) NOT DETECTED Final    Comment: Not Enterococcus species, Streptococcus agalactiae, Streptococcus pyogenes, or Streptococcus pneumoniae. CRITICAL RESULT CALLED TO, READ BACK BY AND VERIFIED WITH: PHARMD A. PAYTES 09/16/2023 @ 0650 BY AB  Streptococcus agalactiae NOT DETECTED NOT DETECTED Final   Streptococcus pneumoniae NOT DETECTED NOT DETECTED Final   Streptococcus pyogenes NOT DETECTED NOT DETECTED Final   A.calcoaceticus-baumannii NOT DETECTED NOT DETECTED Final   Bacteroides fragilis NOT DETECTED NOT DETECTED Final   Enterobacterales NOT DETECTED NOT DETECTED Final   Enterobacter cloacae complex NOT DETECTED NOT DETECTED Final   Escherichia coli NOT DETECTED NOT DETECTED Final   Klebsiella aerogenes NOT DETECTED NOT DETECTED Final   Klebsiella oxytoca NOT DETECTED NOT DETECTED Final   Klebsiella pneumoniae NOT DETECTED NOT DETECTED Final   Proteus species NOT DETECTED NOT DETECTED Final   Salmonella species NOT DETECTED NOT DETECTED Final   Serratia marcescens NOT DETECTED NOT DETECTED Final   Haemophilus influenzae NOT DETECTED NOT DETECTED Final   Neisseria meningitidis NOT DETECTED NOT DETECTED Final   Pseudomonas aeruginosa NOT DETECTED NOT DETECTED Final   Stenotrophomonas maltophilia NOT DETECTED NOT DETECTED Final   Candida albicans NOT DETECTED NOT DETECTED Final   Candida auris NOT DETECTED NOT DETECTED Final   Candida glabrata NOT DETECTED NOT DETECTED Final   Candida krusei NOT DETECTED NOT DETECTED Final   Candida parapsilosis NOT DETECTED NOT DETECTED Final   Candida tropicalis NOT DETECTED NOT DETECTED Final   Cryptococcus neoformans/gattii NOT DETECTED NOT DETECTED Final    Comment: Performed at [name], [address], [place], [place] [address]  Culture, blood (routine x 2)     Status: None (Preliminary result)   Collection Time: 09/15/23  1:26 AM    Specimen: BLOOD RIGHT HAND  Result Value Ref Range Status   Specimen Description BLOOD RIGHT HAND  Final   Special Requests   Final    BOTTLES DRAWN AEROBIC AND ANAEROBIC Blood Culture adequate volume   Culture   Final    NO GROWTH 4 DAYS Performed at [facility], [address] [place], [place] [address]    Report Status PENDING  Incomplete  Culture, blood (Routine X 2) w Reflex to ID Panel     Status: None (Preliminary result)   Collection Time: 09/16/23  9:25 AM   Specimen: BLOOD LEFT HAND  Result Value Ref Range Status   Specimen Description BLOOD LEFT HAND  Final   Special Requests   Final    BOTTLES DRAWN AEROBIC AND ANAEROBIC Blood Culture results may not be optimal due to an inadequate volume of blood received in culture bottles   Culture   Final    NO GROWTH 3 DAYS Performed at [facility], [address], [place], [place] [address]    Report Status PENDING  Incomplete  Culture, blood (Routine X 2) w Reflex to ID Panel     Status: None (Preliminary result)   Collection Time: 09/16/23  9:25 AM   Specimen: BLOOD LEFT HAND  Result Value Ref Range Status   Specimen Description BLOOD LEFT HAND  Final   Special Requests   Final    BOTTLES DRAWN AEROBIC AND ANAEROBIC Blood Culture results may not be optimal due to an inadequate volume of blood received in culture bottles   Culture   Final    NO GROWTH 3 DAYS Performed at [facility], [address], [place], [place] [address]    Report Status PENDING  Incomplete  Aerobic/Anaerobic Culture w Gram Stain (surgical/deep wound)     Status: None (Preliminary result)   Collection Time: 09/16/23  5:18 PM   Specimen: Abscess  Result Value Ref Range Status  Specimen Description ABSCESS  Final   Special Requests NONE  Final   Gram Stain NO WBC SEEN NO ORGANISMS SEEN   Final   Culture   Final    ABUNDANT STREPTOCOCCUS INTERMEDIUS NO ANAEROBES ISOLATED; CULTURE IN PROGRESS FOR 5 DAYS    Report Status PENDING   Incomplete   Organism ID, Bacteria STREPTOCOCCUS INTERMEDIUS  Final      Susceptibility   Streptococcus intermedius - MIC*    PENICILLIN <=0.06 SENSITIVE Sensitive     CEFTRIAXONE <=0.12 SENSITIVE Sensitive     ERYTHROMYCIN <=0.12 SENSITIVE Sensitive     LEVOFLOXACIN 1 SENSITIVE Sensitive     VANCOMYCIN Value in next row Sensitive      0.25 SENSITIVEPerformed at [facility], [address] [place], [place] [address]    * ABUNDANT STREPTOCOCCUS INTERMEDIUS    Procedures/Studies: [place] EKG SITE RITE Result Date: 09/18/2023 If Site Rite image not attached, placement could not be confirmed due to current cardiac rhythm.  IR Guided Drain W Catheter Placement Result Date: 09/16/2023 INDICATION: 72 year old with a large complex hepatic abscess. Plan for image guided percutaneous drainage. EXAM: IMAGE GUIDED PLACEMENT OF HEPATIC ABSCESS DRAIN MEDICATIONS: Moderate sedation ANESTHESIA/SEDATION: Moderate (conscious) sedation was employed during this procedure. A total of Versed 0.5mg  and fentanyl 25 mcg was administered intravenously at the order of the provider performing the procedure. Total intra-service moderate sedation time: 18 minutes. Patient's level of consciousness and vital signs were monitored continuously by radiology nurse throughout the procedure under the supervision of the provider performing the procedure. COMPLICATIONS: None immediate. PROCEDURE: Informed written consent was obtained from the patient after a thorough discussion of the procedural risks, benefits and alternatives. All questions were addressed. Maximal Sterile Barrier Technique was utilized including caps, mask, sterile gowns, sterile gloves, sterile drape, hand hygiene and skin antiseptic. A timeout was performed prior to the initiation of the procedure. Patient was placed supine on the interventional table. Liver was identified with ultrasound. The right side of the abdomen was prepped and draped in sterile fashion.  Skin was anesthetized using 1% lidocaine. Small incision was made. Using ultrasound guidance, 18 gauge trocar needle was directed into the largest hepatic abscess pocket. Purulent fluid was aspirated. Superstiff Amplatz wire was placed. The tract was dilated to accommodate a 10 [place] multipurpose drain. 280 mL of purulent fluid was removed. Drain was attached to a suction bulb and sutured in place. Dressing was placed. Fluoroscopic and ultrasound images were taken and saved for documentation. FINDINGS: Large complex multi loculated hepatic abscess. The largest pocket in the right hepatic lobe was targeted. Drain was placed within this large pocket. 280 mL of purulent fluid was removed. The hepatic abscess was significantly decompressed at the end of the procedure. IMPRESSION: Image guided placement of a hepatic abscess drain. 280 mL of purulent fluid was removed. Fluid was sent for culture. Electronically Signed   By: Richarda Overlie M.D.   On: 09/16/2023 17:36   MR LIVER W WO CONTRAST Result Date: 09/16/2023 CLINICAL DATA:  Evaluate for liver abscess versus tumor. EXAM: MRI ABDOMEN WITHOUT AND WITH CONTRAST TECHNIQUE: Multiplanar multisequence MR imaging of the abdomen was performed both before and after the administration of intravenous contrast. CONTRAST:  8mL GADAVIST GADOBUTROL 1 MMOL/ML IV SOLN COMPARISON:  CT AP from 09/14/2023 FINDINGS: Exam detail is limited secondary to patient body habitus and motion artifact. Lower chest: No acute findings. Hepatobiliary: There is a large, complex, extensively septated mass centered around segment 8 of the liver  and extending into segments 4, 5 and 7. This is a new finding when compared with CT from 03/27/2023. The mass is predominantly T2 hyperintense with extensive T1 isointense irregular internal septations. This demonstrates high signal on the diffusion-weighted images and decreased signal on the ADC mapping. This measures 11.9 x 7.3 by 12.5 cm. On the postcontrast  images there is diffuse enhancement of the internal areas of septation. Previous cholecystectomy. Chronic increased caliber of the common bile duct measuring 1.2 cm with mild intrahepatic bile duct dilatation., unchanged from 02/28/2021. No sign of choledocholithiasis Pancreas: No mass, inflammatory changes, or other parenchymal abnormality identified. Spleen:  Within normal limits in size and appearance. Adrenals/Urinary Tract: Normal adrenal glands. Simple appearing cyst within the upper pole of right kidney is uniformly T2 hyperintense measuring 1.8 cm. No signs of hydronephrosis identified bilaterally. Stomach/Bowel: Stomach is nondistended. No pathologic dilatation of the visualized bowel loops. Colonic diverticulosis. Sub optimal visualization of recently demonstrated diverticular changes involving the sigmoid colon. Vascular/Lymphatic: No aneurysm visualized. Atherosclerotic changes involving the abdominal aorta. No adenopathy. Other:  No significant ascites. Musculoskeletal: No suspicious bone lesions identified. IMPRESSION: 1. There is a large, complex, extensively septated mass centered around segment 8 of the liver and extending into segments 4, 5 and 7. This is a new finding when compared with CT from 03/27/2023. This is favored to represent a large hepatic abscess. Correlate for any clinical signs or symptoms of infection. A neoplastic process is considered less likely. 2. Previous cholecystectomy. Chronic increased caliber of the common bile duct measuring 1.2 cm with mild intrahepatic bile duct dilatation, unchanged from 02/28/2021. No sign of choledocholithiasis. 3. Colonic diverticulosis. Sub optimal visualization of recently demonstrated diverticular changes involving the sigmoid colon. Electronically Signed   By: Signa Kell M.D.   On: 09/16/2023 06:29   MR LUMBAR SPINE WO CONTRAST Result Date: 09/16/2023 CLINICAL DATA:  Low back pain EXAM: MRI LUMBAR SPINE WITHOUT CONTRAST TECHNIQUE:  Multiplanar, multisequence MR imaging of the lumbar spine was performed. No intravenous contrast was administered. COMPARISON:  04/18/2017 FINDINGS: Segmentation:  Standard. Alignment:  Physiologic. Vertebrae:  No fracture, evidence of discitis, or bone lesion. Conus medullaris and cauda equina: Conus extends to the L1 level. Conus and cauda equina appear normal. Paraspinal and other soft tissues: Incompletely visualized multi-septated collection within the liver. Disc levels: L1-L2: Normal disc space and facet joints. No spinal canal stenosis. No neural foraminal stenosis. L2-L3: Small disc bulge. Mild spinal canal stenosis. No neural foraminal stenosis. L3-L4: Small disc bulge. No spinal canal stenosis. Unchanged moderate bilateral neural foraminal stenosis. L4-L5: Small disc bulge with endplate spurring and mild facet hypertrophy. No spinal canal stenosis. Unchanged severe bilateral neural foraminal stenosis. L5-S1: Moderate facet hypertrophy and small right asymmetric disc bulge. No spinal canal stenosis. Unchanged moderate right neural foraminal stenosis. Visualized sacrum: Normal. IMPRESSION: 1. Unchanged severe bilateral L4-L5, moderate bilateral L3-L4 and right L5-S1 neural foraminal stenosis. 2. Mild L2-L3 spinal canal stenosis. 3. Incompletely visualized multi-septated collection within the liver. Electronically Signed   By: Deatra Robinson M.D.   On: 09/16/2023 02:36   CT ABDOMEN PELVIS W CONTRAST Result Date: 09/14/2023 CLINICAL DATA:  Epigastric abdominal pain EXAM: CT ABDOMEN AND PELVIS WITH CONTRAST TECHNIQUE: Multidetector CT imaging of the abdomen and pelvis was performed using the standard protocol following bolus administration of intravenous contrast. RADIATION DOSE REDUCTION: This exam was performed according to the departmental dose-optimization program which includes automated exposure control, adjustment of the mA and/or kV according to patient size and/or  use of iterative reconstruction  technique. CONTRAST:  85mL OMNIPAQUE IOHEXOL 300 MG/ML  SOLN COMPARISON:  02/28/2021 FINDINGS: Lower chest: No acute abnormality. Hepatobiliary: Status post cholecystectomy. A multiloculated, heavily septated collection has developed within the central aspects of segment 4-8 extending to the biliary hilum most in keeping with a developing hepatic abscess measuring 11.0 x 8.7 x 12.4 cm in greatest dimension. Less likely, this may represent a mixed solid cystic mass. Mild extrahepatic biliary ductal dilation is stable, likely representing post cholecystectomy change. No intrahepatic biliary ductal dilation. Tiny cyst again noted within the hepatic dome. Pancreas: Unremarkable Spleen: Unremarkable Adrenals/Urinary Tract: The adrenal glands are unremarkable. The kidneys are normal in size and position. Vascular calcification noted within the renal hila bilaterally. Simple cortical cyst noted within the interpolar region of the right kidney for which no follow-up imaging is recommended. The kidneys are otherwise unremarkable. Bladder is decompressed. Punctate foci of gas within the bladder lumen nonspecific may relate to recent catheterization. Stomach/Bowel: There is severe descending and sigmoid colonic diverticulosis. There is superimposed mild pericolonic inflammatory stranding and mural thickening involving the proximal sigmoid colon suggestive of a very mild acute or subacute diverticulitis. There is no evidence of obstruction or perforation. No free intraperitoneal gas or fluid. The stomach, small bowel, and large bowel are otherwise unremarkable. Appendix absent. Vascular/Lymphatic: Extensive aortoiliac atherosclerotic calcification. No aortic aneurysm. No pathologic adenopathy within the abdomen and pelvis. Reproductive: Uterus and bilateral adnexa are unremarkable. Other: Tiny fat containing umbilical hernia. Musculoskeletal: No acute bone abnormality. Osseous structures are age-appropriate. IMPRESSION: 1.  Interval development of a multiloculated, heavily septated collection within the central aspects of segment 4-8 extending to the biliary hilum most in keeping with a developing hepatic abscess measuring 11.0 x 8.7 x 12.4 cm in greatest dimension. Less likely, this may represent a mixed solid cystic mass. If blood cultures are nondiagnostic, needle sampling of the collection may be helpful to tailor antibiotic therapy though the heavily septated nature of the collection would preclude catheter drainage. 2. Severe descending and sigmoid colonic diverticulosis with superimposed mild pericolonic inflammatory stranding and mural thickening involving the proximal sigmoid colon suggestive of a very mild acute or subacute diverticulitis. No evidence of obstruction or perforation. 3. Punctate foci of gas within the bladder lumen nonspecific may relate to recent catheterization. Aortic Atherosclerosis (ICD10-I70.0). Electronically Signed   By: Helyn Numbers M.D.   On: 09/14/2023 20:50   DG Chest Port 1 View Result Date: 09/14/2023 CLINICAL DATA:  Abdominal pain, bilateral flank pain, weakness EXAM: PORTABLE CHEST 1 VIEW COMPARISON:  01/14/2005 FINDINGS: Mild bibasilar linear atelectasis. Mild elevation of right hemidiaphragm. No superimposed confluent pulmonary infiltrate. No pneumothorax or pleural effusion. Cardiac size within normal limits. Pulmonary vascularity is normal. No acute bone abnormality. IMPRESSION: 1. Mild bibasilar atelectasis. Electronically Signed   By: Helyn Numbers M.D.   On: 09/14/2023 20:39     Time coordinating discharge: Over 30 minutes    Ashley Chamber, MD  [facility] 09/19/2023, 2:50 PM

## 2023-09-19 NOTE — Progress Notes (Signed)
Called echo AT [phone] multiple times but no answer. House supervisor made aware X2 that patient is just waiting for echo prior to discharge, still waiting for update. MD made aware that we are still waiting.

## 2023-09-19 NOTE — TOC Transition Note (Addendum)
Transition of Care [facility]) - Discharge Note   Patient Details  Name: Ashley Galloway MRN: [id number] Date of Birth: [birth year]  Transition of Care [facility] CM/SW Contact:  Ronny Bacon, RN Phone Number: 09/19/2023, 2:44 PM   Clinical Narrative:   Patient is being discharged today. Pam with [employer] made aware. Cory with [facility] made aware.  1512: Per Pam with [facility], IV medications will be delivered to patient home tonight between 5p-7p and that patient will need today's dose here before she is discharged. Team made aware.    Final next level of care: Home w Home Health Services Barriers to Discharge: No Barriers Identified   Patient Goals and CMS Choice Patient states their goals for this hospitalization and ongoing recovery are:: get home   Choice offered to / list presented to : Patient      Discharge Placement                       Discharge Plan and Services Additional resources added to the After Visit Summary for   In-house Referral: NA Discharge Planning Services: CM Consult Post Acute Care Choice: Home Health                    HH Arranged: RN [facility] Agency: [facility] Date [facility] Agency Contacted: 09/18/23 Time HH Agency Contacted: 1125 Representative spoke with at [facility] Agency: Lorenza Chick  Social Drivers of Health (SDOH) Interventions SDOH Screenings   Food Insecurity: No Food Insecurity (09/15/2023)  Housing: Patient Declined (09/15/2023)  Transportation Needs: No Transportation Needs (09/15/2023)  Utilities: Not At Risk (09/15/2023)  Tobacco Use: High Risk (09/16/2023)     Readmission Risk Interventions     No data to display

## 2023-09-19 NOTE — Progress Notes (Signed)
PHARMACY CONSULT NOTE FOR:  OUTPATIENT  PARENTERAL ANTIBIOTIC THERAPY (OPAT)  Indication: hepatic abscess Regimen: ceftriaxone 2g IV once daily x 4 weeks, also continue oral flagyl x 2 weeks per ID  End date: 10/14/2023  IV antibiotic discharge orders are pended. To discharging provider:  please sign these orders via discharge navigator,  Select New Orders & click on the button choice - Manage This Unsigned Work.     Thank you for allowing pharmacy to be a part of this patient's care.  Blane Ohara, PharmD, BCPS  PGY2 Pharmacy Resident

## 2023-09-19 NOTE — Progress Notes (Addendum)
         Date: 09/19/2023  Patient name: Ashley Galloway  Medical record number: [id number]  Date of birth: 05-28-1951   Diagnosis: Liver abscess and bacteremia  Culture Result: Streptococcus intermedius  Allergies  Allergen Reactions   Tape     Tape from hormone patch.    OPAT Orders Discharge antibiotics to be given via PICC line Discharge antibiotics:  Ceftriaxone 2 grams IV daily  + flagyl 500mg  BID (this one would just give for 2 weeks since it seems to be difficult for her to tolerate). Dr Renold Don was going to DC it if no anerobes grow but her cultures wont finalize today when she wants to go home  Duration of CTX: 4 End Date: 10/14/2023  [facility] Care Per Protocol:  Home health RN for IV administration and teaching; PICC line care and labs.    Labs weekly while on IV antibiotics: _x_ CBC with differential  _x_ CMP  __ Please pull PIC at completion of IV antibiotics _x_ Please leave PIC in place until doctor has seen patient or been notified  Fax weekly labs to [phone]  Clinic Follow Up Appt:   Andy Meadows has an appointment on 10/08/2023 at 230PM with Dr. Renold Don at  [facility] for Infectious Disease, which  is located in the [facility] at  [address] in [place].  [address], which is located to the left of the elevators.  Phone: [phone]  Fax: [phone]  [email or web address]  The patient should arrive 30 minutes prior to their appoitment.   Acey Lav 09/19/2023, 11:56 AM

## 2023-09-20 DIAGNOSIS — B955 Unspecified streptococcus as the cause of diseases classified elsewhere: Secondary | ICD-10-CM | POA: Diagnosis not present

## 2023-09-20 DIAGNOSIS — R7881 Bacteremia: Secondary | ICD-10-CM | POA: Diagnosis not present

## 2023-09-20 DIAGNOSIS — K750 Abscess of liver: Secondary | ICD-10-CM | POA: Diagnosis not present

## 2023-09-20 LAB — CULTURE, BLOOD (ROUTINE X 2)
Culture: NO GROWTH
Special Requests: ADEQUATE

## 2023-09-21 LAB — AEROBIC/ANAEROBIC CULTURE W GRAM STAIN (SURGICAL/DEEP WOUND): Gram Stain: NONE SEEN

## 2023-09-21 LAB — CULTURE, BLOOD (ROUTINE X 2)
Culture: NO GROWTH
Culture: NO GROWTH

## 2023-09-25 ENCOUNTER — Telehealth: Payer: Self-pay

## 2023-09-25 NOTE — Telephone Encounter (Signed)
Ok

## 2023-09-25 NOTE — Telephone Encounter (Signed)
Is she seeing [place] soon?  I look at the numbers for a long time but they don't appear to come down   I think i want to see the patient may be in 1 to 2 weeks

## 2023-09-25 NOTE — Telephone Encounter (Signed)
She is scheduled to see you on 1/9

## 2023-09-25 NOTE — Telephone Encounter (Signed)
Abnormal labs from 09/21/23:  WBC 24.7 Neutrophils (absolute) 21.2 ESR 84 (ref range 0-40) CRP 248 (ref range 0-10)  Sandie Ano, RN

## 2023-09-28 DIAGNOSIS — K750 Abscess of liver: Secondary | ICD-10-CM | POA: Diagnosis not present

## 2023-09-28 DIAGNOSIS — R7881 Bacteremia: Secondary | ICD-10-CM | POA: Diagnosis not present

## 2023-09-28 DIAGNOSIS — B955 Unspecified streptococcus as the cause of diseases classified elsewhere: Secondary | ICD-10-CM | POA: Diagnosis not present

## 2023-09-29 DIAGNOSIS — K750 Abscess of liver: Secondary | ICD-10-CM | POA: Diagnosis not present

## 2023-09-29 DIAGNOSIS — B955 Unspecified streptococcus as the cause of diseases classified elsewhere: Secondary | ICD-10-CM | POA: Diagnosis not present

## 2023-09-29 DIAGNOSIS — R7881 Bacteremia: Secondary | ICD-10-CM | POA: Diagnosis not present

## 2023-09-30 DIAGNOSIS — K750 Abscess of liver: Secondary | ICD-10-CM | POA: Diagnosis not present

## 2023-09-30 DIAGNOSIS — B955 Unspecified streptococcus as the cause of diseases classified elsewhere: Secondary | ICD-10-CM | POA: Diagnosis not present

## 2023-09-30 DIAGNOSIS — R7881 Bacteremia: Secondary | ICD-10-CM | POA: Diagnosis not present

## 2023-10-01 DIAGNOSIS — M48062 Spinal stenosis, lumbar region with neurogenic claudication: Secondary | ICD-10-CM | POA: Diagnosis not present

## 2023-10-01 DIAGNOSIS — K5792 Diverticulitis of intestine, part unspecified, without perforation or abscess without bleeding: Secondary | ICD-10-CM | POA: Diagnosis not present

## 2023-10-01 DIAGNOSIS — K750 Abscess of liver: Secondary | ICD-10-CM | POA: Diagnosis not present

## 2023-10-01 DIAGNOSIS — R600 Localized edema: Secondary | ICD-10-CM | POA: Diagnosis not present

## 2023-10-01 DIAGNOSIS — R7881 Bacteremia: Secondary | ICD-10-CM | POA: Diagnosis not present

## 2023-10-02 ENCOUNTER — Other Ambulatory Visit (HOSPITAL_COMMUNITY): Payer: Self-pay | Admitting: Internal Medicine

## 2023-10-02 DIAGNOSIS — R7881 Bacteremia: Secondary | ICD-10-CM

## 2023-10-02 DIAGNOSIS — R600 Localized edema: Secondary | ICD-10-CM | POA: Diagnosis not present

## 2023-10-04 DIAGNOSIS — R7881 Bacteremia: Secondary | ICD-10-CM | POA: Diagnosis not present

## 2023-10-04 DIAGNOSIS — K750 Abscess of liver: Secondary | ICD-10-CM | POA: Diagnosis not present

## 2023-10-04 DIAGNOSIS — B955 Unspecified streptococcus as the cause of diseases classified elsewhere: Secondary | ICD-10-CM | POA: Diagnosis not present

## 2023-10-05 ENCOUNTER — Ambulatory Visit (HOSPITAL_COMMUNITY): Payer: Medicare HMO | Attending: Internal Medicine

## 2023-10-05 DIAGNOSIS — R7881 Bacteremia: Secondary | ICD-10-CM | POA: Insufficient documentation

## 2023-10-05 DIAGNOSIS — B955 Unspecified streptococcus as the cause of diseases classified elsewhere: Secondary | ICD-10-CM | POA: Diagnosis not present

## 2023-10-05 DIAGNOSIS — K750 Abscess of liver: Secondary | ICD-10-CM | POA: Diagnosis not present

## 2023-10-05 LAB — ECHOCARDIOGRAM COMPLETE
Area-P 1/2: 3.03 cm2
P 1/2 time: 477 ms
S' Lateral: 2.4 cm

## 2023-10-08 ENCOUNTER — Telehealth: Payer: Self-pay

## 2023-10-08 ENCOUNTER — Encounter: Payer: Self-pay | Admitting: Internal Medicine

## 2023-10-08 ENCOUNTER — Ambulatory Visit (INDEPENDENT_AMBULATORY_CARE_PROVIDER_SITE_OTHER): Payer: Medicare HMO | Admitting: Internal Medicine

## 2023-10-08 ENCOUNTER — Other Ambulatory Visit: Payer: Self-pay

## 2023-10-08 VITALS — BP 111/57 | HR 60 | Resp 16 | Ht 67.0 in | Wt 190.0 lb

## 2023-10-08 DIAGNOSIS — K750 Abscess of liver: Secondary | ICD-10-CM

## 2023-10-08 MED ORDER — CEFADROXIL 500 MG PO CAPS: 1000.0000 mg | ORAL_CAPSULE | Freq: Two times a day (BID) | ORAL | 0 refills | Status: AC

## 2023-10-08 NOTE — Patient Instructions (Signed)
 I have placed order for Ir to evaluate you again for drain removal. I have asked them to see you within a week and we'll call and help arrange as well   Picc and iv ceftriaxone  to be stopped 10/14/23 by [facility] nurse   Start cefadroxil  2 tablet twice a day starting 10/15/23   When radiology is able to remove the drain, you can finish your cefadroxil  antibiotics roughly 5 days after that   See me in 4 weeks

## 2023-10-08 NOTE — Progress Notes (Signed)
 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   MRN: [id number]  Chief Complaint  Patient presents with   Hospitalization Follow-up    HPI:  Ashley Galloway is a 73 y.o. female here for f/u strep intermedius bsi and hepatic abscess  10/08/23 id clinic f/u Drain placed by ir No f/u with ir yet  Doing better Opat labs reviewed  Drains not draining any amount Hh nurse comes every week and for picc  She doesn't know if she needs to flush the drain at all  Finished 2 weeks metronidazole  On ceftriaxone  4 week splanned till 10/14/23  No fever/chill, n/v/rash/diarrhea currently Clinically feeling better    Allergies  Allergen Reactions   Tape     Tape from hormone patch.      Outpatient Medications Prior to Visit  Medication Sig Dispense Refill   aspirin EC 81 MG tablet Take 81 mg by mouth daily. Swallow whole.     cefTRIAXone  (ROCEPHIN ) IVPB Inject 2 g into the vein daily for 25 days. Indication:  hepatic abscess First Dose Received Inpatient: Yes Last Day of Therapy:  10/14/2023 Labs - Once weekly:  CBC/D and BMP, Labs - Once weekly: ESR and CRP Method of administration: IV Push Method of administration may be changed at the discretion of home infusion pharmacist based upon assessment of the patient and/or caregiver's ability to self-administer the medication ordered. Please leave PIC in place until doctor has seen patient or been notified 25  Units 0   KLOR-CON  M20 20 MEQ tablet Take 20 mEq by mouth daily.     latanoprost (XALATAN) 0.005 % ophthalmic solution Place 1 drop into both eyes at bedtime.     lovastatin (MEVACOR) 20 MG tablet Take 20 mg by mouth daily.     Multiple Vitamins-Minerals (CENTRUM SILVER WOMEN 50+) TABS Take 1 tablet by mouth daily.     timolol (TIMOPTIC) 0.5 % ophthalmic solution Place 1 drop into both eyes 2 (two) times daily.     traZODone (DESYREL) 50 MG tablet Take 1-2 tablets by mouth at bedtime.     triamterene-hydrochlorothiazide (DYAZIDE) 37.5-25 MG capsule Take 1 capsule by mouth every morning.     No facility-administered medications prior to visit.     Social History   Socioeconomic History   Marital status: Married    Spouse name: Not on file   Number of children: Not on file   Years of education: Not on file   Highest education level: Not on file  Occupational History   Not on file  Tobacco Use   Smoking status: Every Day    Types: Cigarettes    Passive exposure: Current   Smokeless tobacco: Never  Substance and Sexual Activity   Alcohol  use: Not on file   Drug use: Not on file   Sexual activity: Not on file  Other Topics Concern   Not on file  Social History Narrative   Not on file   Social Drivers  of Health   Financial Resource Strain: Not on file  Food Insecurity: No Food Insecurity (09/15/2023)   Hunger Vital Sign    Worried About Running Out of Food in the Last Year: Never true    Ran Out of Food in the Last Year: Never true  Transportation Needs: No Transportation Needs (09/15/2023)   PRAPARE - Administrator, Civil Service (Medical): No    Lack of Transportation (Non-Medical): No  Physical Activity: Not on file  Stress: Not on file  Social Connections: Not on file  Intimate Partner Violence: Not At Risk (09/15/2023)   Humiliation, Afraid, Rape, and Kick questionnaire    Fear of Current or Ex-Partner: No    Emotionally Abused: No    Physically  Abused: No    Sexually Abused: No      Review of Systems    Minor pain at site of perc drain placement  Objective:    BP (!) 111/57   Pulse 60   Resp 16   Ht 5' 7 (1.702 m)   Wt 190 lb (86.2 kg)   BMI 29.76 kg/m  Nursing note and vital signs reviewed.  Physical Exam     General/constitutional: no distress, pleasant HEENT: Normocephalic, PER, Conj Clear, EOMI, Oropharynx clear Neck supple CV: rrr no mrg Lungs: clear to auscultation, normal respiratory effort Abd: Soft, Nontender -- perc drain wither clear straw color; no pus Ext: no edema Skin: No Rash Neuro: nonfocal MSK: no peripheral joint swelling/tenderness/warmth; back spines nontender  Central line -- rue picc site no erythema/purulence  Labs: Opat labs 10/05/23 Cbc 10/11/537; cr 0.65; alb 3.2; lft 32/21/155/0.2 Sed rate 110 Crp 66  Opat labs 09/28/23 Esr 87 Crp 115  Micro:  Serology:  Imaging: Reviewed  09/14/23 ct abd pelv with contrast 1. Interval development of a multiloculated, heavily septated collection within the central aspects of segment 4-8 extending to the biliary hilum most in keeping with a developing hepatic abscess measuring 11.0 x 8.7 x 12.4 cm in greatest dimension. Less likely, this may represent a mixed solid cystic mass. If blood cultures are nondiagnostic, needle sampling of the collection may be helpful to tailor antibiotic therapy though the heavily septated nature of the collection would preclude catheter drainage. 2. Severe descending and sigmoid colonic diverticulosis with superimposed mild pericolonic inflammatory stranding and mural thickening involving the proximal sigmoid colon suggestive of a very mild acute or subacute diverticulitis. No evidence of obstruction or perforation. 3. Punctate foci of gas within the bladder lumen nonspecific may relate to recent catheterization.     10/05/23 tte  1. Left ventricular ejection fraction, by estimation, is 60 to 65%.  The  left ventricle has normal function. The left ventricle has no regional  wall motion abnormalities. There is mild concentric left ventricular  hypertrophy. Left ventricular diastolic  parameters are consistent with Grade I diastolic dysfunction (impaired  relaxation).   2. Right ventricular systolic function is normal. The right ventricular  size is normal.   3. Left atrial size was mildly dilated.   4. The mitral valve is normal in structure. Trivial mitral valve  regurgitation. No evidence of mitral stenosis.   5. The aortic valve is tricuspid. There is mild calcification of the  aortic valve. Aortic valve regurgitation is mild. Aortic valve  sclerosis/calcification is present, without any evidence of aortic  stenosis.   6. The inferior vena cava is normal in size with greater than 50%  respiratory variability, suggesting right atrial pressure of  3 mmHg.    Assessment & Plan:   Problem List Items Addressed This Visit       Digestive   Hepatic abscess - Primary   Relevant Orders   IR REMOVAL BILIARY DRAIN      No orders of the defined types were placed in this encounter.   Abx: Ceftriaxone  planned 4 weeks eot 10/14/23 metronidazole   73 yo female relatively healthy who came in due to fever/rigor and progressive weeks of abd pain associated with poor appetite/weight loss found to have a large multiseptated liver abscess and strep intermedius on blood cx and also abscess cx   Patient reports having colonoscopy every 5 years (last one 2023) for polyps  Ir placed perc drain 12/18 and purulent fluid aspirated sent for cx growing strep intermedius  12/16 bcx grew strep intermedius  Strep intermedius likes to form pyogenic focus and likely sole organism for this Tte negative for veg; strep intermedius lower risk IE. And quick clearance (negative bcx 12/17 and 12/18 did't think this was IE   --------- 10/08/23 id clinic assessment Clinically feels much better about 85% of  baseline health Opat labs decreasing crp The percutanous drain hasn't put out much -- the last time the drain was emptied was 3 weeks prior to this visit Appetite improving She hasn't seen IR yet and haven't heard anything from them; kevin Bruning was the PA who last saw patient in the hospital on 09/17/23  Picc line can be removed on 10/14/23  Oral cefadroxil  given 2 more weeks and to start when iv abx finishes on 10/14/23- once ir take out the drain can finish 5 more days of cefadroxil    Follow up with me about 3 weeks after finishing the abx pill (plan for roughly about 4 weeks from now)  Chart forwarded today to ir as well -- ir order to remove catheter also placed     Follow-up: Return in about 4 weeks (around 11/05/2023).      Constance ONEIDA Passer, MD [facility] 10/08/2023, 2:56 PM

## 2023-10-08 NOTE — Telephone Encounter (Signed)
  Per provider ok to PULL PICC after End Date.   Provider:Dr.Vu End Date: 10/14/23  Drain to be removed by IR on 10/21/23 Notified [facility] and [facility] to contact Home Health Nurse.

## 2023-10-10 DIAGNOSIS — B955 Unspecified streptococcus as the cause of diseases classified elsewhere: Secondary | ICD-10-CM | POA: Diagnosis not present

## 2023-10-10 DIAGNOSIS — R7881 Bacteremia: Secondary | ICD-10-CM | POA: Diagnosis not present

## 2023-10-10 DIAGNOSIS — K750 Abscess of liver: Secondary | ICD-10-CM | POA: Diagnosis not present

## 2023-10-15 DIAGNOSIS — R600 Localized edema: Secondary | ICD-10-CM | POA: Diagnosis not present

## 2023-10-15 DIAGNOSIS — M545 Low back pain, unspecified: Secondary | ICD-10-CM | POA: Diagnosis not present

## 2023-10-15 DIAGNOSIS — K750 Abscess of liver: Secondary | ICD-10-CM | POA: Diagnosis not present

## 2023-10-19 ENCOUNTER — Other Ambulatory Visit: Payer: Self-pay | Admitting: Pharmacist

## 2023-10-19 ENCOUNTER — Telehealth: Payer: Self-pay

## 2023-10-19 DIAGNOSIS — R110 Nausea: Secondary | ICD-10-CM

## 2023-10-19 MED ORDER — ONDANSETRON 4 MG PO TBDP: 4.0000 mg | ORAL_TABLET | Freq: Three times a day (TID) | ORAL | 0 refills | Status: DC | PRN

## 2023-10-19 NOTE — Telephone Encounter (Signed)
Spoke with patient. No questions at this time.  Juanita Laster, RMA

## 2023-10-19 NOTE — Telephone Encounter (Signed)
I will send in zofran for her. She can take up to three times a day.

## 2023-10-19 NOTE — Telephone Encounter (Signed)
Pt would like to know if she should have refill on oral antibiotics due to missing doses with vomiting or if okay to continue with current amount. Juanita Laster, RMA

## 2023-10-19 NOTE — Telephone Encounter (Signed)
Patient called stating since starting Cefadroxil 1/16 she has been dealing with nausea/vomiting. Has tried taking medication with and without food, but has not had any relief with this. Is not taking anything to help with nausea. Would like any prescription sent today to [facility] on [address]. Ashley Galloway, RMA

## 2023-10-20 ENCOUNTER — Other Ambulatory Visit: Payer: Self-pay | Admitting: Radiology

## 2023-10-21 ENCOUNTER — Other Ambulatory Visit: Payer: Self-pay | Admitting: Radiology

## 2023-10-21 ENCOUNTER — Ambulatory Visit (HOSPITAL_COMMUNITY)
Admission: RE | Admit: 2023-10-21 | Discharge: 2023-10-21 | Disposition: A | Discharge status: Home / Self Care | Payer: Medicare Other | Source: Ambulatory Visit | Attending: Radiology | Admitting: Radiology

## 2023-10-21 ENCOUNTER — Ambulatory Visit (HOSPITAL_BASED_OUTPATIENT_CLINIC_OR_DEPARTMENT_OTHER)
Admission: RE | Admit: 2023-10-21 | Discharge: 2023-10-21 | Disposition: A | Discharge status: Home / Self Care | Payer: Medicare HMO | Source: Ambulatory Visit | Attending: Internal Medicine | Admitting: Internal Medicine

## 2023-10-21 DIAGNOSIS — K7689 Other specified diseases of liver: Secondary | ICD-10-CM | POA: Diagnosis not present

## 2023-10-21 DIAGNOSIS — I70203 Unspecified atherosclerosis of native arteries of extremities, bilateral legs: Secondary | ICD-10-CM | POA: Diagnosis not present

## 2023-10-21 DIAGNOSIS — I701 Atherosclerosis of renal artery: Secondary | ICD-10-CM | POA: Diagnosis not present

## 2023-10-21 DIAGNOSIS — K750 Abscess of liver: Secondary | ICD-10-CM

## 2023-10-21 DIAGNOSIS — K5790 Diverticulosis of intestine, part unspecified, without perforation or abscess without bleeding: Secondary | ICD-10-CM | POA: Insufficient documentation

## 2023-10-21 DIAGNOSIS — A419 Sepsis, unspecified organism: Secondary | ICD-10-CM | POA: Diagnosis not present

## 2023-10-21 DIAGNOSIS — M60052 Infective myositis, left thigh: Secondary | ICD-10-CM | POA: Diagnosis not present

## 2023-10-21 DIAGNOSIS — I700 Atherosclerosis of aorta: Secondary | ICD-10-CM | POA: Insufficient documentation

## 2023-10-21 DIAGNOSIS — Z0389 Encounter for observation for other suspected diseases and conditions ruled out: Secondary | ICD-10-CM | POA: Diagnosis not present

## 2023-10-21 HISTORY — PX: IR RADIOLOGIST EVAL & MGMT: IMG5224

## 2023-10-21 MED ORDER — IOHEXOL 350 MG/ML SOLN
75.0000 mL | Freq: Once | INTRAVENOUS | Status: AC | PRN
  Administered 2023-10-21: 75 mL via INTRAVENOUS

## 2023-10-21 NOTE — Progress Notes (Addendum)
   IR Note  Pt here today for evaluation of hepatic abscess and drain  Hepatic abscess drain placed in IR 09/16/23 Pt is followed with Dr Marcene Duos Was sent to IR after office visit there ---  Pt denies pain Denies fever No output for weeks Site is clean and dry No bleeding; no infection  CT Abd ordered this am Reviewed with Dr Elby Showers  Collection has resolved May remove drain  Drain removed without complication Dressing placed

## 2023-10-22 DIAGNOSIS — M545 Low back pain, unspecified: Secondary | ICD-10-CM | POA: Diagnosis not present

## 2023-10-22 DIAGNOSIS — K750 Abscess of liver: Secondary | ICD-10-CM | POA: Diagnosis not present

## 2023-10-22 DIAGNOSIS — F419 Anxiety disorder, unspecified: Secondary | ICD-10-CM | POA: Diagnosis not present

## 2023-10-22 DIAGNOSIS — J432 Centrilobular emphysema: Secondary | ICD-10-CM | POA: Diagnosis not present

## 2023-10-22 DIAGNOSIS — R110 Nausea: Secondary | ICD-10-CM | POA: Diagnosis not present

## 2023-10-23 ENCOUNTER — Emergency Department (HOSPITAL_COMMUNITY): Payer: Medicare HMO

## 2023-10-23 ENCOUNTER — Inpatient Hospital Stay (HOSPITAL_COMMUNITY)
Admission: EM | Admit: 2023-10-23 | Discharge: 2023-11-05 | DRG: 853 | Disposition: A | Discharge status: Hospice | Payer: Medicare HMO | Attending: Internal Medicine | Admitting: Internal Medicine

## 2023-10-23 ENCOUNTER — Encounter (HOSPITAL_COMMUNITY): Payer: Self-pay

## 2023-10-23 ENCOUNTER — Other Ambulatory Visit: Payer: Self-pay

## 2023-10-23 DIAGNOSIS — A480 Gas gangrene: Secondary | ICD-10-CM

## 2023-10-23 DIAGNOSIS — E8770 Fluid overload, unspecified: Secondary | ICD-10-CM | POA: Diagnosis present

## 2023-10-23 DIAGNOSIS — Z66 Do not resuscitate: Secondary | ICD-10-CM | POA: Diagnosis present

## 2023-10-23 DIAGNOSIS — R6521 Severe sepsis with septic shock: Secondary | ICD-10-CM | POA: Diagnosis present

## 2023-10-23 DIAGNOSIS — B961 Klebsiella pneumoniae [K. pneumoniae] as the cause of diseases classified elsewhere: Secondary | ICD-10-CM | POA: Diagnosis present

## 2023-10-23 DIAGNOSIS — E876 Hypokalemia: Secondary | ICD-10-CM | POA: Diagnosis present

## 2023-10-23 DIAGNOSIS — D649 Anemia, unspecified: Secondary | ICD-10-CM | POA: Diagnosis present

## 2023-10-23 DIAGNOSIS — R609 Edema, unspecified: Secondary | ICD-10-CM | POA: Diagnosis not present

## 2023-10-23 DIAGNOSIS — Z79899 Other long term (current) drug therapy: Secondary | ICD-10-CM

## 2023-10-23 DIAGNOSIS — K5900 Constipation, unspecified: Secondary | ICD-10-CM | POA: Diagnosis present

## 2023-10-23 DIAGNOSIS — Z1152 Encounter for screening for COVID-19: Secondary | ICD-10-CM

## 2023-10-23 DIAGNOSIS — B955 Unspecified streptococcus as the cause of diseases classified elsewhere: Secondary | ICD-10-CM | POA: Diagnosis present

## 2023-10-23 DIAGNOSIS — M48061 Spinal stenosis, lumbar region without neurogenic claudication: Secondary | ICD-10-CM | POA: Diagnosis present

## 2023-10-23 DIAGNOSIS — K5720 Diverticulitis of large intestine with perforation and abscess without bleeding: Secondary | ICD-10-CM | POA: Diagnosis present

## 2023-10-23 DIAGNOSIS — M7989 Other specified soft tissue disorders: Principal | ICD-10-CM

## 2023-10-23 DIAGNOSIS — R159 Full incontinence of feces: Secondary | ICD-10-CM | POA: Diagnosis present

## 2023-10-23 DIAGNOSIS — E162 Hypoglycemia, unspecified: Secondary | ICD-10-CM | POA: Diagnosis not present

## 2023-10-23 DIAGNOSIS — R000 Tachycardia, unspecified: Secondary | ICD-10-CM | POA: Diagnosis not present

## 2023-10-23 DIAGNOSIS — R739 Hyperglycemia, unspecified: Secondary | ICD-10-CM | POA: Diagnosis present

## 2023-10-23 DIAGNOSIS — N179 Acute kidney failure, unspecified: Secondary | ICD-10-CM | POA: Diagnosis present

## 2023-10-23 DIAGNOSIS — M79605 Pain in left leg: Secondary | ICD-10-CM | POA: Diagnosis not present

## 2023-10-23 DIAGNOSIS — F419 Anxiety disorder, unspecified: Secondary | ICD-10-CM | POA: Diagnosis present

## 2023-10-23 DIAGNOSIS — B999 Unspecified infectious disease: Secondary | ICD-10-CM

## 2023-10-23 DIAGNOSIS — A499 Bacterial infection, unspecified: Secondary | ICD-10-CM

## 2023-10-23 DIAGNOSIS — I4891 Unspecified atrial fibrillation: Secondary | ICD-10-CM | POA: Diagnosis present

## 2023-10-23 DIAGNOSIS — Z743 Need for continuous supervision: Secondary | ICD-10-CM | POA: Diagnosis not present

## 2023-10-23 DIAGNOSIS — E871 Hypo-osmolality and hyponatremia: Secondary | ICD-10-CM | POA: Diagnosis present

## 2023-10-23 DIAGNOSIS — I959 Hypotension, unspecified: Secondary | ICD-10-CM | POA: Diagnosis not present

## 2023-10-23 DIAGNOSIS — G8929 Other chronic pain: Secondary | ICD-10-CM | POA: Diagnosis present

## 2023-10-23 DIAGNOSIS — Z7982 Long term (current) use of aspirin: Secondary | ICD-10-CM

## 2023-10-23 DIAGNOSIS — Z1611 Resistance to penicillins: Secondary | ICD-10-CM | POA: Diagnosis present

## 2023-10-23 DIAGNOSIS — E785 Hyperlipidemia, unspecified: Secondary | ICD-10-CM | POA: Diagnosis present

## 2023-10-23 DIAGNOSIS — K651 Peritoneal abscess: Secondary | ICD-10-CM

## 2023-10-23 DIAGNOSIS — Z9049 Acquired absence of other specified parts of digestive tract: Secondary | ICD-10-CM

## 2023-10-23 DIAGNOSIS — R791 Abnormal coagulation profile: Secondary | ICD-10-CM | POA: Diagnosis present

## 2023-10-23 DIAGNOSIS — Z0389 Encounter for observation for other suspected diseases and conditions ruled out: Secondary | ICD-10-CM | POA: Diagnosis not present

## 2023-10-23 DIAGNOSIS — Z8673 Personal history of transient ischemic attack (TIA), and cerebral infarction without residual deficits: Secondary | ICD-10-CM

## 2023-10-23 DIAGNOSIS — J432 Centrilobular emphysema: Secondary | ICD-10-CM | POA: Diagnosis present

## 2023-10-23 DIAGNOSIS — M726 Necrotizing fasciitis: Secondary | ICD-10-CM

## 2023-10-23 DIAGNOSIS — M60052 Infective myositis, left thigh: Secondary | ICD-10-CM | POA: Diagnosis present

## 2023-10-23 DIAGNOSIS — R7303 Prediabetes: Secondary | ICD-10-CM | POA: Diagnosis present

## 2023-10-23 DIAGNOSIS — E872 Acidosis, unspecified: Secondary | ICD-10-CM | POA: Diagnosis present

## 2023-10-23 DIAGNOSIS — L02416 Cutaneous abscess of left lower limb: Secondary | ICD-10-CM | POA: Diagnosis present

## 2023-10-23 DIAGNOSIS — I10 Essential (primary) hypertension: Secondary | ICD-10-CM | POA: Diagnosis present

## 2023-10-23 DIAGNOSIS — E669 Obesity, unspecified: Secondary | ICD-10-CM | POA: Diagnosis present

## 2023-10-23 DIAGNOSIS — A419 Sepsis, unspecified organism: Principal | ICD-10-CM | POA: Diagnosis present

## 2023-10-23 DIAGNOSIS — I739 Peripheral vascular disease, unspecified: Secondary | ICD-10-CM | POA: Diagnosis present

## 2023-10-23 DIAGNOSIS — B9620 Unspecified Escherichia coli [E. coli] as the cause of diseases classified elsewhere: Secondary | ICD-10-CM | POA: Diagnosis present

## 2023-10-23 DIAGNOSIS — R7989 Other specified abnormal findings of blood chemistry: Secondary | ICD-10-CM

## 2023-10-23 DIAGNOSIS — Z515 Encounter for palliative care: Secondary | ICD-10-CM

## 2023-10-23 DIAGNOSIS — F17210 Nicotine dependence, cigarettes, uncomplicated: Secondary | ICD-10-CM | POA: Diagnosis present

## 2023-10-23 DIAGNOSIS — E43 Unspecified severe protein-calorie malnutrition: Secondary | ICD-10-CM | POA: Insufficient documentation

## 2023-10-23 DIAGNOSIS — Z6831 Body mass index (BMI) 31.0-31.9, adult: Secondary | ICD-10-CM

## 2023-10-23 DIAGNOSIS — G9341 Metabolic encephalopathy: Secondary | ICD-10-CM | POA: Diagnosis present

## 2023-10-23 LAB — CBC WITH DIFFERENTIAL/PLATELET

## 2023-10-23 MED ORDER — SODIUM CHLORIDE 0.9 % IV SOLN
2.0000 g | Freq: Once | INTRAVENOUS | Status: AC
  Administered 2023-10-23: 2 g via INTRAVENOUS
  Filled 2023-10-23: qty 12.5

## 2023-10-23 MED ORDER — LACTATED RINGERS IV SOLN: INTRAVENOUS | Status: AC

## 2023-10-23 MED ORDER — VANCOMYCIN HCL IN DEXTROSE 1-5 GM/200ML-% IV SOLN
1000.0000 mg | Freq: Once | INTRAVENOUS | Status: AC
  Administered 2023-10-24: 1000 mg via INTRAVENOUS
  Filled 2023-10-23: qty 200

## 2023-10-23 MED ORDER — METRONIDAZOLE 500 MG/100ML IV SOLN
500.0000 mg | Freq: Once | INTRAVENOUS | Status: AC
  Administered 2023-10-24: 500 mg via INTRAVENOUS
  Filled 2023-10-23: qty 100

## 2023-10-23 NOTE — Sepsis Progress Note (Signed)
Elink monitoring for the code sepsis protocol.   0230: Notified MD Opyd of need to order a 3rd LA due to them rising, also notified that previous provider who ordered code sepsis did not order fluid boluses.  9147: Boluses are ordered now as well as 3rd LA.

## 2023-10-23 NOTE — ED Triage Notes (Signed)
BIB EMS/ c/o left leg pain/ swelling/ no marked redness/ denies fevers/ also c/o chills/ gen weakness/ no hx of leg pain/ liver abscess drained on 08/20/2024/ pt is A&Ox4

## 2023-10-24 ENCOUNTER — Inpatient Hospital Stay (HOSPITAL_COMMUNITY): Payer: Medicare HMO | Admitting: Anesthesiology

## 2023-10-24 ENCOUNTER — Encounter (HOSPITAL_COMMUNITY): Payer: Self-pay | Admitting: Family Medicine

## 2023-10-24 ENCOUNTER — Inpatient Hospital Stay (HOSPITAL_COMMUNITY): Payer: Medicare HMO

## 2023-10-24 ENCOUNTER — Emergency Department (HOSPITAL_COMMUNITY): Payer: Medicare HMO

## 2023-10-24 ENCOUNTER — Encounter (HOSPITAL_COMMUNITY)
Admission: EM | Disposition: A | Discharge status: Hospice | Payer: Self-pay | Source: Home / Self Care | Attending: Pulmonary Disease

## 2023-10-24 DIAGNOSIS — Z515 Encounter for palliative care: Secondary | ICD-10-CM | POA: Diagnosis not present

## 2023-10-24 DIAGNOSIS — K5641 Fecal impaction: Secondary | ICD-10-CM | POA: Diagnosis not present

## 2023-10-24 DIAGNOSIS — N289 Disorder of kidney and ureter, unspecified: Secondary | ICD-10-CM | POA: Diagnosis not present

## 2023-10-24 DIAGNOSIS — E669 Obesity, unspecified: Secondary | ICD-10-CM | POA: Diagnosis not present

## 2023-10-24 DIAGNOSIS — R7303 Prediabetes: Secondary | ICD-10-CM | POA: Diagnosis not present

## 2023-10-24 DIAGNOSIS — J449 Chronic obstructive pulmonary disease, unspecified: Secondary | ICD-10-CM

## 2023-10-24 DIAGNOSIS — N179 Acute kidney failure, unspecified: Secondary | ICD-10-CM

## 2023-10-24 DIAGNOSIS — M60052 Infective myositis, left thigh: Secondary | ICD-10-CM

## 2023-10-24 DIAGNOSIS — L02416 Cutaneous abscess of left lower limb: Secondary | ICD-10-CM | POA: Diagnosis not present

## 2023-10-24 DIAGNOSIS — A499 Bacterial infection, unspecified: Secondary | ICD-10-CM | POA: Diagnosis not present

## 2023-10-24 DIAGNOSIS — Z66 Do not resuscitate: Secondary | ICD-10-CM | POA: Diagnosis not present

## 2023-10-24 DIAGNOSIS — B9562 Methicillin resistant Staphylococcus aureus infection as the cause of diseases classified elsewhere: Secondary | ICD-10-CM | POA: Diagnosis not present

## 2023-10-24 DIAGNOSIS — K5720 Diverticulitis of large intestine with perforation and abscess without bleeding: Secondary | ICD-10-CM | POA: Diagnosis present

## 2023-10-24 DIAGNOSIS — G9341 Metabolic encephalopathy: Secondary | ICD-10-CM

## 2023-10-24 DIAGNOSIS — A419 Sepsis, unspecified organism: Secondary | ICD-10-CM | POA: Diagnosis not present

## 2023-10-24 DIAGNOSIS — Z452 Encounter for adjustment and management of vascular access device: Secondary | ICD-10-CM | POA: Diagnosis not present

## 2023-10-24 DIAGNOSIS — R600 Localized edema: Secondary | ICD-10-CM | POA: Diagnosis not present

## 2023-10-24 DIAGNOSIS — J95821 Acute postprocedural respiratory failure: Secondary | ICD-10-CM | POA: Diagnosis not present

## 2023-10-24 DIAGNOSIS — E872 Acidosis, unspecified: Secondary | ICD-10-CM | POA: Diagnosis not present

## 2023-10-24 DIAGNOSIS — B965 Pseudomonas (aeruginosa) (mallei) (pseudomallei) as the cause of diseases classified elsewhere: Secondary | ICD-10-CM | POA: Diagnosis not present

## 2023-10-24 DIAGNOSIS — E43 Unspecified severe protein-calorie malnutrition: Secondary | ICD-10-CM | POA: Diagnosis not present

## 2023-10-24 DIAGNOSIS — I70203 Unspecified atherosclerosis of native arteries of extremities, bilateral legs: Secondary | ICD-10-CM | POA: Diagnosis not present

## 2023-10-24 DIAGNOSIS — K5780 Diverticulitis of intestine, part unspecified, with perforation and abscess without bleeding: Secondary | ICD-10-CM | POA: Diagnosis not present

## 2023-10-24 DIAGNOSIS — Z4682 Encounter for fitting and adjustment of non-vascular catheter: Secondary | ICD-10-CM | POA: Diagnosis not present

## 2023-10-24 DIAGNOSIS — M726 Necrotizing fasciitis: Secondary | ICD-10-CM | POA: Diagnosis not present

## 2023-10-24 DIAGNOSIS — I10 Essential (primary) hypertension: Secondary | ICD-10-CM | POA: Diagnosis not present

## 2023-10-24 DIAGNOSIS — K651 Peritoneal abscess: Secondary | ICD-10-CM | POA: Diagnosis not present

## 2023-10-24 DIAGNOSIS — R509 Fever, unspecified: Secondary | ICD-10-CM | POA: Diagnosis not present

## 2023-10-24 DIAGNOSIS — E785 Hyperlipidemia, unspecified: Secondary | ICD-10-CM | POA: Diagnosis not present

## 2023-10-24 DIAGNOSIS — K750 Abscess of liver: Secondary | ICD-10-CM | POA: Diagnosis not present

## 2023-10-24 DIAGNOSIS — B999 Unspecified infectious disease: Secondary | ICD-10-CM | POA: Diagnosis not present

## 2023-10-24 DIAGNOSIS — I701 Atherosclerosis of renal artery: Secondary | ICD-10-CM | POA: Diagnosis not present

## 2023-10-24 DIAGNOSIS — R6521 Severe sepsis with septic shock: Secondary | ICD-10-CM | POA: Diagnosis not present

## 2023-10-24 DIAGNOSIS — R739 Hyperglycemia, unspecified: Secondary | ICD-10-CM | POA: Diagnosis not present

## 2023-10-24 DIAGNOSIS — R0600 Dyspnea, unspecified: Secondary | ICD-10-CM | POA: Diagnosis not present

## 2023-10-24 DIAGNOSIS — I4891 Unspecified atrial fibrillation: Secondary | ICD-10-CM | POA: Diagnosis not present

## 2023-10-24 DIAGNOSIS — Z1152 Encounter for screening for COVID-19: Secondary | ICD-10-CM | POA: Diagnosis not present

## 2023-10-24 DIAGNOSIS — R7881 Bacteremia: Secondary | ICD-10-CM | POA: Diagnosis not present

## 2023-10-24 DIAGNOSIS — D649 Anemia, unspecified: Secondary | ICD-10-CM | POA: Diagnosis not present

## 2023-10-24 DIAGNOSIS — R0989 Other specified symptoms and signs involving the circulatory and respiratory systems: Secondary | ICD-10-CM | POA: Diagnosis not present

## 2023-10-24 DIAGNOSIS — I739 Peripheral vascular disease, unspecified: Secondary | ICD-10-CM | POA: Diagnosis not present

## 2023-10-24 DIAGNOSIS — E440 Moderate protein-calorie malnutrition: Secondary | ICD-10-CM | POA: Diagnosis not present

## 2023-10-24 DIAGNOSIS — K630 Abscess of intestine: Secondary | ICD-10-CM | POA: Diagnosis not present

## 2023-10-24 DIAGNOSIS — K7689 Other specified diseases of liver: Secondary | ICD-10-CM | POA: Diagnosis not present

## 2023-10-24 DIAGNOSIS — J432 Centrilobular emphysema: Secondary | ICD-10-CM | POA: Diagnosis not present

## 2023-10-24 DIAGNOSIS — M7989 Other specified soft tissue disorders: Secondary | ICD-10-CM | POA: Diagnosis not present

## 2023-10-24 DIAGNOSIS — E871 Hypo-osmolality and hyponatremia: Secondary | ICD-10-CM | POA: Diagnosis not present

## 2023-10-24 DIAGNOSIS — E876 Hypokalemia: Secondary | ICD-10-CM | POA: Diagnosis not present

## 2023-10-24 DIAGNOSIS — A480 Gas gangrene: Secondary | ICD-10-CM | POA: Diagnosis not present

## 2023-10-24 DIAGNOSIS — Z1611 Resistance to penicillins: Secondary | ICD-10-CM | POA: Diagnosis not present

## 2023-10-24 HISTORY — PX: I & D EXTREMITY: SHX5045

## 2023-10-24 LAB — GLUCOSE, CAPILLARY
Glucose-Capillary: 69 mg/dL — ABNORMAL LOW (ref 70–99)
Glucose-Capillary: 88 mg/dL (ref 70–99)

## 2023-10-24 LAB — POCT I-STAT 7, (LYTES, BLD GAS, ICA,H+H)
Acid-base deficit: 2 mmol/L (ref 0.0–2.0)
Acid-base deficit: 3 mmol/L — ABNORMAL HIGH (ref 0.0–2.0)
Bicarbonate: 21.5 mmol/L (ref 20.0–28.0)
Bicarbonate: 21.6 mmol/L (ref 20.0–28.0)
Calcium, Ion: 1.16 mmol/L (ref 1.15–1.40)
Calcium, Ion: 1.18 mmol/L (ref 1.15–1.40)
HCT: 25 % — ABNORMAL LOW (ref 36.0–46.0)
HCT: 28 % — ABNORMAL LOW (ref 36.0–46.0)
Hemoglobin: 8.5 g/dL — ABNORMAL LOW (ref 12.0–15.0)
Hemoglobin: 9.5 g/dL — ABNORMAL LOW (ref 12.0–15.0)
O2 Saturation: 100 %
O2 Saturation: 100 %
Potassium: 3.1 mmol/L — ABNORMAL LOW (ref 3.5–5.1)
Potassium: 3.4 mmol/L — ABNORMAL LOW (ref 3.5–5.1)
Sodium: 135 mmol/L (ref 135–145)
Sodium: 135 mmol/L (ref 135–145)
TCO2: 22 mmol/L (ref 22–32)
TCO2: 23 mmol/L (ref 22–32)
pCO2 arterial: 32.2 mm[Hg] (ref 32–48)
pCO2 arterial: 33 mm[Hg] (ref 32–48)
pH, Arterial: 7.422 (ref 7.35–7.45)
pH, Arterial: 7.435 (ref 7.35–7.45)
pO2, Arterial: 192 mm[Hg] — ABNORMAL HIGH (ref 83–108)
pO2, Arterial: 194 mm[Hg] — ABNORMAL HIGH (ref 83–108)

## 2023-10-24 LAB — BLOOD CULTURE ID PANEL (REFLEXED) - BCID2

## 2023-10-24 LAB — CBC WITH DIFFERENTIAL/PLATELET
Blasts: 0 10*3/uL (ref 0.0–0.1)
HCT: 45.9 % (ref 36.0–46.0)
Hemoglobin: 14.8 g/dL (ref 12.0–15.0)
Lymphocytes Relative: 0 %
Lymphs Abs: 0 10*3/uL (ref 0.7–4.0)
MCH: 27.9 pg (ref 26.0–34.0)
MCHC: 32.2 g/dL (ref 30.0–36.0)
MCV: 86.4 fL (ref 80.0–100.0)
Metamyelocytes Relative: 0 10*3/uL (ref 0.0–0.5)
Metamyelocytes Relative: 3 %
Monocytes Absolute: 0 10*3/uL (ref 0.1–1.0)
Monocytes Relative: 22 %
Monocytes Relative: 3 %
Myelocytes: 0 10*3/uL (ref 0.0–0.1)
Myelocytes: 2 10*3/uL (ref 0.0–0.5)
Neutro Abs: 10 10*3/uL (ref 1.7–7.7)
Neutrophils Relative %: 60 %
Other: 1.8 10*3/uL (ref 0.7–4.0)
Platelets: 606 10*3/uL — ABNORMAL HIGH (ref 150–400)
Promyelocytes Relative: 0 %
Promyelocytes Relative: 0.89 10*3/uL — ABNORMAL HIGH (ref 0.00–0.07)
RBC Morphology: 0 %
RBC: 5.31 MIL/uL — ABNORMAL HIGH (ref 3.87–5.11)
RDW: 14.5 % (ref 11.5–15.5)
Smear Review: 14.5 10*3/uL — ABNORMAL HIGH (ref 1.7–7.7)
WBC: 17.7 10*3/uL — ABNORMAL HIGH (ref 4.0–10.5)
nRBC: 0 % (ref 0.0–0.2)
nRBC: 0 /100{WBCs}
nRBC: 0.5 10*3/uL

## 2023-10-24 LAB — COMPREHENSIVE METABOLIC PANEL
ALT: 36 U/L (ref 0–44)
AST: 64 U/L — ABNORMAL HIGH (ref 15–41)
Albumin: 2.2 g/dL — ABNORMAL LOW (ref 3.5–5.0)
Alkaline Phosphatase: 119 U/L (ref 38–126)
Anion gap: 16 — ABNORMAL HIGH (ref 5–15)
BUN: 59 mg/dL — ABNORMAL HIGH (ref 8–23)
CO2: 19 mmol/L — ABNORMAL LOW (ref 22–32)
Calcium: 9.6 mg/dL (ref 8.9–10.3)
Chloride: 96 mmol/L — ABNORMAL LOW (ref 98–111)
Creatinine, Ser: 1.34 mg/dL — ABNORMAL HIGH (ref 0.44–1.00)
GFR, Estimated: 42 mL/min — ABNORMAL LOW (ref >60)
Glucose, Bld: 181 mg/dL — ABNORMAL HIGH (ref 70–99)
Potassium: 5.2 mmol/L — ABNORMAL HIGH (ref 3.5–5.1)
Sodium: 131 mmol/L — ABNORMAL LOW (ref 135–145)
Total Bilirubin: 1.3 mg/dL — ABNORMAL HIGH (ref 0.0–1.2)
Total Protein: 8 g/dL (ref 6.5–8.1)

## 2023-10-24 LAB — CBC
HCT: 37.4 % (ref 36.0–46.0)
Hemoglobin: 12.1 g/dL (ref 12.0–15.0)
MCH: 28.3 pg (ref 26.0–34.0)
MCHC: 32.4 g/dL (ref 30.0–36.0)
MCV: 87.4 fL (ref 80.0–100.0)
Platelets: 456 10*3/uL — ABNORMAL HIGH (ref 150–400)
RBC: 4.28 MIL/uL (ref 3.87–5.11)
RDW: 14.4 % (ref 11.5–15.5)
WBC: 11.2 10*3/uL — ABNORMAL HIGH (ref 4.0–10.5)
nRBC: 0 % (ref 0.0–0.2)

## 2023-10-24 LAB — POCT I-STAT, CHEM 8
BUN: 18 mg/dL (ref 8–23)
Calcium, Ion: 1.24 mmol/L (ref 1.15–1.40)
Chloride: 99 mmol/L (ref 98–111)
Creatinine, Ser: 0.4 mg/dL — ABNORMAL LOW (ref 0.44–1.00)
Glucose, Bld: 109 mg/dL — ABNORMAL HIGH (ref 70–99)
HCT: 29 % — ABNORMAL LOW (ref 36.0–46.0)
Hemoglobin: 9.9 g/dL — ABNORMAL LOW (ref 12.0–15.0)
Potassium: 3.6 mmol/L (ref 3.5–5.1)
Sodium: 134 mmol/L — ABNORMAL LOW (ref 135–145)
TCO2: 25 mmol/L (ref 22–32)

## 2023-10-24 LAB — APTT: aPTT: 24 s (ref 24–36)

## 2023-10-24 LAB — RESP PANEL BY RT-PCR (RSV, FLU A&B, COVID)  RVPGX2
Influenza A by PCR: NEGATIVE
Influenza B by PCR: NEGATIVE
Resp Syncytial Virus by PCR: NEGATIVE
SARS Coronavirus 2 by RT PCR: NEGATIVE

## 2023-10-24 LAB — PROTIME-INR
INR: 1.6 — ABNORMAL HIGH (ref 0.8–1.2)
Prothrombin Time: 18.9 s — ABNORMAL HIGH (ref 11.4–15.2)

## 2023-10-24 LAB — HEPATIC FUNCTION PANEL
ALT: 30 U/L (ref 0–44)
AST: 51 U/L — ABNORMAL HIGH (ref 15–41)
Albumin: 1.6 g/dL — ABNORMAL LOW (ref 3.5–5.0)
Alkaline Phosphatase: 86 U/L (ref 38–126)
Bilirubin, Direct: 0.3 mg/dL — ABNORMAL HIGH (ref 0.0–0.2)
Indirect Bilirubin: 0.4 mg/dL (ref 0.3–0.9)
Total Bilirubin: 0.7 mg/dL (ref 0.0–1.2)
Total Protein: 5.8 g/dL — ABNORMAL LOW (ref 6.5–8.1)

## 2023-10-24 LAB — CBG MONITORING, ED
Glucose-Capillary: 118 mg/dL — ABNORMAL HIGH (ref 70–99)
Glucose-Capillary: 115 mg/dL — ABNORMAL HIGH (ref 70–99)

## 2023-10-24 LAB — BASIC METABOLIC PANEL
Anion gap: 12 (ref 5–15)
BUN: 51 mg/dL — ABNORMAL HIGH (ref 8–23)
CO2: 20 mmol/L — ABNORMAL LOW (ref 22–32)
Calcium: 8.4 mg/dL — ABNORMAL LOW (ref 8.9–10.3)
Chloride: 98 mmol/L (ref 98–111)
Creatinine, Ser: 0.98 mg/dL (ref 0.44–1.00)
GFR, Estimated: >60 mL/min (ref >60)
Glucose, Bld: 140 mg/dL — ABNORMAL HIGH (ref 70–99)
Potassium: 4.2 mmol/L (ref 3.5–5.1)
Sodium: 130 mmol/L — ABNORMAL LOW (ref 135–145)

## 2023-10-24 LAB — URINALYSIS, W/ REFLEX TO CULTURE (INFECTION SUSPECTED)
Bacteria, UA: NONE SEEN
Bilirubin Urine: NEGATIVE
Glucose, UA: NEGATIVE mg/dL
Hgb urine dipstick: NEGATIVE
Ketones, ur: NEGATIVE mg/dL
Leukocytes,Ua: NEGATIVE
Nitrite: NEGATIVE
Protein, ur: 30 mg/dL — AB
Specific Gravity, Urine: 1.026 (ref 1.005–1.030)
pH: 5 (ref 5.0–8.0)

## 2023-10-24 LAB — MRSA NEXT GEN BY PCR, NASAL: MRSA by PCR Next Gen: NOT DETECTED

## 2023-10-24 LAB — LACTIC ACID, PLASMA
Lactic Acid, Venous: 5.9 mmol/L (ref 0.5–1.9)
Lactic Acid, Venous: 3.7 mmol/L (ref 0.5–1.9)

## 2023-10-24 LAB — I-STAT CG4 LACTIC ACID, ED
Lactic Acid, Venous: 6 mmol/L (ref 0.5–1.9)
Lactic Acid, Venous: 6.7 mmol/L (ref 0.5–1.9)

## 2023-10-24 LAB — TROPONIN I (HIGH SENSITIVITY)
Troponin I (High Sensitivity): 21 ng/L — ABNORMAL HIGH (ref <18)
Troponin I (High Sensitivity): 27 ng/L — ABNORMAL HIGH (ref <18)

## 2023-10-24 LAB — HEMOGLOBIN A1C
Hgb A1c MFr Bld: 6.3 % — ABNORMAL HIGH (ref 4.8–5.6)
Mean Plasma Glucose: 134.11 mg/dL

## 2023-10-24 SURGERY — IRRIGATION AND DEBRIDEMENT EXTREMITY
Anesthesia: General | Laterality: Left

## 2023-10-24 MED ORDER — INSULIN ASPART 100 UNIT/ML IJ SOLN
0.0000 [IU] | INTRAMUSCULAR | Status: DC
  Filled 2023-10-24: qty 0.06

## 2023-10-24 MED ORDER — ALBUMIN HUMAN 5 % IV SOLN: INTRAVENOUS | Status: DC | PRN

## 2023-10-24 MED ORDER — SUGAMMADEX SODIUM 200 MG/2ML IV SOLN
INTRAVENOUS | Status: DC | PRN
  Administered 2023-10-24: 200 mg via INTRAVENOUS

## 2023-10-24 MED ORDER — ONDANSETRON HCL 4 MG/2ML IJ SOLN
INTRAMUSCULAR | Status: DC | PRN
  Administered 2023-10-24: 4 mg via INTRAVENOUS

## 2023-10-24 MED ORDER — FENTANYL CITRATE (PF) 250 MCG/5ML IJ SOLN
INTRAMUSCULAR | Status: AC
  Filled 2023-10-24: qty 5

## 2023-10-24 MED ORDER — LACTATED RINGERS IV BOLUS (SEPSIS)
1000.0000 mL | Freq: Once | INTRAVENOUS | Status: AC
Start: 2023-10-24 — End: 2023-10-24
  Administered 2023-10-24: 1000 mL via INTRAVENOUS

## 2023-10-24 MED ORDER — FENTANYL CITRATE (PF) 100 MCG/2ML IJ SOLN
INTRAMUSCULAR | Status: AC
  Filled 2023-10-24: qty 2

## 2023-10-24 MED ORDER — LINEZOLID 600 MG/300ML IV SOLN
600.0000 mg | Freq: Two times a day (BID) | INTRAVENOUS | Status: DC
  Administered 2023-10-24: 600 mg via INTRAVENOUS
  Filled 2023-10-24 (×3): qty 300

## 2023-10-24 MED ORDER — LIDOCAINE HCL (PF) 2 % IJ SOLN
INTRAMUSCULAR | Status: AC
  Filled 2023-10-24: qty 5

## 2023-10-24 MED ORDER — ACETAMINOPHEN 10 MG/ML IV SOLN
INTRAVENOUS | Status: AC
  Filled 2023-10-24: qty 100

## 2023-10-24 MED ORDER — SODIUM CHLORIDE 0.9 % IV SOLN: 0.5000 ug/min | INTRAVENOUS | Status: DC

## 2023-10-24 MED ORDER — PROPOFOL 10 MG/ML IV BOLUS
INTRAVENOUS | Status: DC | PRN
  Administered 2023-10-24: 100 mg via INTRAVENOUS

## 2023-10-24 MED ORDER — LACTATED RINGERS IV BOLUS
500.0000 mL | Freq: Once | INTRAVENOUS | Status: AC
  Administered 2023-10-24: 500 mL via INTRAVENOUS

## 2023-10-24 MED ORDER — INSULIN ASPART 100 UNIT/ML IJ SOLN
2.0000 [IU] | INTRAMUSCULAR | Status: DC
  Administered 2023-10-26: 2 [IU] via SUBCUTANEOUS
  Administered 2023-10-26: 6 [IU] via SUBCUTANEOUS
  Administered 2023-10-27: 4 [IU] via SUBCUTANEOUS
  Administered 2023-10-27: 2 [IU] via SUBCUTANEOUS

## 2023-10-24 MED ORDER — NOREPINEPHRINE 4 MG/250ML-% IV SOLN
2.0000 ug/min | INTRAVENOUS | Status: DC
  Administered 2023-10-25 (×2): 2 ug/min via INTRAVENOUS
  Filled 2023-10-24: qty 250

## 2023-10-24 MED ORDER — IOHEXOL 350 MG/ML SOLN
100.0000 mL | Freq: Once | INTRAVENOUS | Status: AC | PRN
  Administered 2023-10-24: 100 mL via INTRAVENOUS

## 2023-10-24 MED ORDER — SUCCINYLCHOLINE CHLORIDE 200 MG/10ML IV SOSY
PREFILLED_SYRINGE | INTRAVENOUS | Status: DC | PRN
  Administered 2023-10-24: 120 mg via INTRAVENOUS

## 2023-10-24 MED ORDER — ORAL CARE MOUTH RINSE
15.0000 mL | OROMUCOSAL | Status: DC | PRN
  Administered 2023-10-31 (×5): 15 mL via OROMUCOSAL

## 2023-10-24 MED ORDER — MAGNESIUM SULFATE 2 GM/50ML IV SOLN
2.0000 g | Freq: Once | INTRAVENOUS | Status: AC
  Administered 2023-10-24: 2 g via INTRAVENOUS
  Filled 2023-10-24: qty 50

## 2023-10-24 MED ORDER — VASOPRESSIN 20 UNITS/100 ML INFUSION FOR SHOCK
0.0000 [IU]/min | INTRAVENOUS | Status: DC
Start: 2023-10-24 — End: 2023-10-25
  Administered 2023-10-24: .03 [IU]/min via INTRAVENOUS
  Filled 2023-10-24: qty 100

## 2023-10-24 MED ORDER — LACTATED RINGERS IV BOLUS (SEPSIS)
250.0000 mL | Freq: Once | INTRAVENOUS | Status: AC
Start: 2023-10-24 — End: 2023-10-24
  Administered 2023-10-24: 250 mL via INTRAVENOUS

## 2023-10-24 MED ORDER — PIPERACILLIN-TAZOBACTAM 3.375 G IVPB
3.3750 g | Freq: Three times a day (TID) | INTRAVENOUS | Status: DC
  Administered 2023-10-24 – 2023-11-02 (×27): 3.375 g via INTRAVENOUS
  Filled 2023-10-24 (×28): qty 50

## 2023-10-24 MED ORDER — NALOXONE HCL 0.4 MG/ML IJ SOLN
INTRAMUSCULAR | Status: AC
Start: 2023-10-24 — End: ?
  Filled 2023-10-24: qty 1

## 2023-10-24 MED ORDER — ROCURONIUM BROMIDE 100 MG/10ML IV SOLN
INTRAVENOUS | Status: DC | PRN
  Administered 2023-10-24: 10 mg via INTRAVENOUS
  Administered 2023-10-24: 30 mg via INTRAVENOUS

## 2023-10-24 MED ORDER — ACETAMINOPHEN 10 MG/ML IV SOLN
INTRAVENOUS | Status: DC | PRN
  Administered 2023-10-24: 1000 mg via INTRAVENOUS

## 2023-10-24 MED ORDER — ACETAMINOPHEN 650 MG RE SUPP: 650.0000 mg | Freq: Four times a day (QID) | RECTAL | Status: DC | PRN

## 2023-10-24 MED ORDER — PHENYLEPHRINE 80 MCG/ML (10ML) SYRINGE FOR IV PUSH (FOR BLOOD PRESSURE SUPPORT)
PREFILLED_SYRINGE | INTRAVENOUS | Status: AC
  Filled 2023-10-24: qty 10

## 2023-10-24 MED ORDER — ONDANSETRON HCL 4 MG PO TABS: 4.0000 mg | ORAL_TABLET | Freq: Four times a day (QID) | ORAL | Status: DC | PRN

## 2023-10-24 MED ORDER — MIDAZOLAM HCL 2 MG/2ML IJ SOLN
INTRAMUSCULAR | Status: AC | PRN
  Administered 2023-10-24: 1 mg via INTRAVENOUS

## 2023-10-24 MED ORDER — SODIUM CHLORIDE 0.9 % IR SOLN
Status: DC | PRN
  Administered 2023-10-24 (×5): 3000 mL

## 2023-10-24 MED ORDER — LACTATED RINGERS IV BOLUS: 2000.0000 mL | Freq: Once | INTRAVENOUS | Status: DC

## 2023-10-24 MED ORDER — EPINEPHRINE HCL 5 MG/250ML IV SOLN IN NS
0.5000 ug/min | INTRAVENOUS | Status: DC
  Filled 2023-10-24: qty 250

## 2023-10-24 MED ORDER — ACETAMINOPHEN 325 MG PO TABS
650.0000 mg | ORAL_TABLET | Freq: Four times a day (QID) | ORAL | Status: DC | PRN
  Administered 2023-10-24 – 2023-10-31 (×7): 650 mg via ORAL
  Filled 2023-10-24 (×8): qty 2

## 2023-10-24 MED ORDER — DEXTROSE IN LACTATED RINGERS 5 % IV SOLN: INTRAVENOUS | Status: DC | PRN

## 2023-10-24 MED ORDER — IPRATROPIUM-ALBUTEROL 0.5-2.5 (3) MG/3ML IN SOLN: 3.0000 mL | Freq: Four times a day (QID) | RESPIRATORY_TRACT | Status: DC | PRN

## 2023-10-24 MED ORDER — DEXTROSE 50 % IV SOLN
12.5000 g | INTRAVENOUS | Status: AC
Start: 2023-10-24 — End: 2023-10-24
  Administered 2023-10-24: 12.5 g via INTRAVENOUS

## 2023-10-24 MED ORDER — MIDAZOLAM HCL 5 MG/5ML IJ SOLN
INTRAMUSCULAR | Status: DC | PRN
  Administered 2023-10-24 (×2): 1 mg via INTRAVENOUS

## 2023-10-24 MED ORDER — MIDAZOLAM HCL 2 MG/2ML IJ SOLN
INTRAMUSCULAR | Status: AC
Start: 2023-10-24 — End: ?
  Filled 2023-10-24: qty 2

## 2023-10-24 MED ORDER — OXYCODONE HCL 5 MG PO TABS
5.0000 mg | ORAL_TABLET | ORAL | Status: DC | PRN
  Administered 2023-10-24 – 2023-10-27 (×3): 5 mg via ORAL
  Filled 2023-10-24 (×4): qty 1

## 2023-10-24 MED ORDER — MIDAZOLAM HCL 2 MG/2ML IJ SOLN
INTRAMUSCULAR | Status: AC
  Filled 2023-10-24: qty 2

## 2023-10-24 MED ORDER — FENTANYL CITRATE (PF) 100 MCG/2ML IJ SOLN
INTRAMUSCULAR | Status: DC | PRN
  Administered 2023-10-24: 100 ug via INTRAVENOUS
  Administered 2023-10-24 (×3): 50 ug via INTRAVENOUS

## 2023-10-24 MED ORDER — LACTATED RINGERS IV BOLUS
250.0000 mL | Freq: Once | INTRAVENOUS | Status: AC
  Administered 2023-10-24: 250 mL via INTRAVENOUS

## 2023-10-24 MED ORDER — PIPERACILLIN-TAZOBACTAM 3.375 G IVPB 30 MIN
3.3750 g | Freq: Once | INTRAVENOUS | Status: DC
Start: 2023-10-24 — End: 2023-10-24

## 2023-10-24 MED ORDER — ESMOLOL HCL 100 MG/10ML IV SOLN
INTRAVENOUS | Status: DC | PRN
  Administered 2023-10-24: 20 mg via INTRAVENOUS

## 2023-10-24 MED ORDER — PROPOFOL 10 MG/ML IV BOLUS
INTRAVENOUS | Status: AC
  Filled 2023-10-24: qty 20

## 2023-10-24 MED ORDER — NOREPINEPHRINE 4 MG/250ML-% IV SOLN
INTRAVENOUS | Status: AC
  Administered 2023-10-24: 2 ug/min via INTRAVENOUS
  Filled 2023-10-24: qty 250

## 2023-10-24 MED ORDER — ONDANSETRON HCL 4 MG/2ML IJ SOLN
4.0000 mg | Freq: Four times a day (QID) | INTRAMUSCULAR | Status: DC | PRN
  Administered 2023-10-28: 4 mg via INTRAVENOUS
  Filled 2023-10-24: qty 2

## 2023-10-24 MED ORDER — SODIUM CHLORIDE 0.9 % IV SOLN
250.0000 mL | INTRAVENOUS | Status: AC
  Administered 2023-10-25: 250 mL via INTRAVENOUS

## 2023-10-24 MED ORDER — DEXTROSE 50 % IV SOLN
INTRAVENOUS | Status: AC
  Filled 2023-10-24: qty 50

## 2023-10-24 MED ORDER — CLINDAMYCIN PHOSPHATE 600 MG/50ML IV SOLN
600.0000 mg | Freq: Three times a day (TID) | INTRAVENOUS | Status: DC
  Administered 2023-10-24 – 2023-10-25 (×4): 600 mg via INTRAVENOUS
  Filled 2023-10-24 (×4): qty 50

## 2023-10-24 MED ORDER — FENTANYL CITRATE (PF) 100 MCG/2ML IJ SOLN
INTRAMUSCULAR | Status: AC | PRN
  Administered 2023-10-24: 50 ug via INTRAVENOUS

## 2023-10-24 MED ORDER — LACTATED RINGERS IV SOLN: INTRAVENOUS | Status: DC | PRN

## 2023-10-24 MED ORDER — CHLORHEXIDINE GLUCONATE CLOTH 2 % EX PADS
6.0000 | MEDICATED_PAD | Freq: Every day | CUTANEOUS | Status: DC
  Administered 2023-10-24 – 2023-10-27 (×3): 6 via TOPICAL

## 2023-10-24 MED ORDER — SODIUM CHLORIDE 0.9% FLUSH
3.0000 mL | Freq: Two times a day (BID) | INTRAVENOUS | Status: DC
  Administered 2023-10-24 – 2023-11-01 (×17): 3 mL via INTRAVENOUS

## 2023-10-24 MED ORDER — HYDROMORPHONE HCL 1 MG/ML IJ SOLN
0.5000 mg | INTRAMUSCULAR | Status: DC | PRN
  Administered 2023-10-24: 0.5 mg via INTRAVENOUS
  Administered 2023-10-25 – 2023-10-28 (×5): 1 mg via INTRAVENOUS
  Filled 2023-10-24 (×8): qty 1

## 2023-10-24 MED ORDER — PHENYLEPHRINE HCL (PRESSORS) 10 MG/ML IV SOLN
INTRAVENOUS | Status: DC | PRN
  Administered 2023-10-24 (×3): 240 ug via INTRAVENOUS

## 2023-10-24 MED ORDER — ROCURONIUM BROMIDE 10 MG/ML (PF) SYRINGE
PREFILLED_SYRINGE | INTRAVENOUS | Status: AC
  Filled 2023-10-24: qty 10

## 2023-10-24 MED ORDER — HEPARIN SODIUM (PORCINE) 5000 UNIT/ML IJ SOLN
5000.0000 [IU] | Freq: Three times a day (TID) | INTRAMUSCULAR | Status: AC
Start: 2023-10-24 — End: ?
  Administered 2023-10-25 – 2023-11-01 (×21): 5000 [IU] via SUBCUTANEOUS
  Filled 2023-10-24 (×22): qty 1

## 2023-10-24 MED ORDER — FAMOTIDINE IN NACL 20-0.9 MG/50ML-% IV SOLN
20.0000 mg | INTRAVENOUS | Status: DC
  Administered 2023-10-25 – 2023-10-28 (×4): 20 mg via INTRAVENOUS
  Filled 2023-10-24 (×4): qty 50

## 2023-10-24 SURGICAL SUPPLY — 49 items
BAG COUNTER SPONGE SURGICOUNT (BAG) IMPLANT
BAG ZIPLOCK 12X15 (MISCELLANEOUS) ×1 IMPLANT
BANDAGE ESMARK 6X9 LF (GAUZE/BANDAGES/DRESSINGS) ×1 IMPLANT
BNDG ELASTIC 6X10 VLCR STRL LF (GAUZE/BANDAGES/DRESSINGS) IMPLANT
BNDG ESMARK 6X9 LF (GAUZE/BANDAGES/DRESSINGS)
BNDG GAUZE DERMACEA FLUFF 4 (GAUZE/BANDAGES/DRESSINGS) ×1 IMPLANT
CHLORAPREP W/TINT 26 (MISCELLANEOUS) ×1 IMPLANT
COVER SURGICAL LIGHT HANDLE (MISCELLANEOUS) ×1 IMPLANT
CUFF TOURN SGL QUICK 18X4 (TOURNIQUET CUFF) ×1 IMPLANT
CUFF TRNQT CYL 24X4X16.5-23 (TOURNIQUET CUFF) ×1 IMPLANT
CUFF TRNQT CYL 34X4.125X (TOURNIQUET CUFF) ×1 IMPLANT
DRAIN PENROSE 0.5X18 (DRAIN) ×1 IMPLANT
DRAPE ORTHO 2.5IN SPLIT 77X108 (DRAPES) IMPLANT
DRAPE U-SHAPE 47X51 STRL (DRAPES) IMPLANT
DRSG TEGADERM 8X12 (GAUZE/BANDAGES/DRESSINGS) IMPLANT
DRSG VAC GRANUFOAM LG (GAUZE/BANDAGES/DRESSINGS) IMPLANT
DRSG VAC GRANUFOAM MED (GAUZE/BANDAGES/DRESSINGS) IMPLANT
ELECT REM PT RETURN 15FT ADLT (MISCELLANEOUS) ×1 IMPLANT
GAUZE PAD ABD 8X10 STRL (GAUZE/BANDAGES/DRESSINGS) ×1 IMPLANT
GAUZE SPONGE 4X4 12PLY STRL (GAUZE/BANDAGES/DRESSINGS) ×1 IMPLANT
GLOVE BIO SURGEON STRL SZ7 (GLOVE) IMPLANT
GLOVE BIOGEL M 7.0 STRL (GLOVE) ×1 IMPLANT
GLOVE BIOGEL PI IND STRL 7.5 (GLOVE) ×2 IMPLANT
GLOVE BIOGEL PI IND STRL 8 (GLOVE) ×1 IMPLANT
GLOVE BIOGEL PI IND STRL 8.5 (GLOVE) ×1 IMPLANT
GLOVE ECLIPSE 8.0 STRL XLNG CF (GLOVE) IMPLANT
GLOVE ORTHO TXT STRL SZ7.5 (GLOVE) ×2 IMPLANT
GLOVE SURG LX STRL 7.5 STRW (GLOVE) ×2 IMPLANT
GLOVE SURG ORTHO 8.0 STRL STRW (GLOVE) ×1 IMPLANT
GOWN SPEC L3 XXLG W/TWL (GOWN DISPOSABLE) ×2 IMPLANT
JET LAVAGE IRRISEPT WOUND (IRRIGATION / IRRIGATOR)
KIT BASIN OR (CUSTOM PROCEDURE TRAY) ×1 IMPLANT
KIT TURNOVER KIT A (KITS) IMPLANT
LAVAGE JET IRRISEPT WOUND (IRRIGATION / IRRIGATOR) IMPLANT
MANIFOLD NEPTUNE II (INSTRUMENTS) ×1 IMPLANT
PACK ORTHO EXTREMITY (CUSTOM PROCEDURE TRAY) ×1 IMPLANT
PAD CAST 4YDX4 CTTN HI CHSV (CAST SUPPLIES) ×1 IMPLANT
PADDING CAST COTTON 6X4 STRL (CAST SUPPLIES) IMPLANT
PENCIL SMOKE EVACUATOR (MISCELLANEOUS) IMPLANT
PROTECTOR NERVE ULNAR (MISCELLANEOUS) ×1 IMPLANT
SET HNDPC FAN SPRY TIP SCT (DISPOSABLE) ×1 IMPLANT
SOL SCRUB PVP POV-IOD 4OZ 7.5% (MISCELLANEOUS) ×1
SOLUTION SCRB POV-IOD 4OZ 7.5% (MISCELLANEOUS) ×1 IMPLANT
SPONGE T-LAP 18X18 ~~LOC~~+RFID (SPONGE) IMPLANT
STAPLER SKIN PROX WIDE 3.9 (STAPLE) IMPLANT
SWAB CULTURE ESWAB REG 1ML (MISCELLANEOUS) IMPLANT
SYR CONTROL 10ML LL (SYRINGE) ×1 IMPLANT
TOWEL GREEN STERILE FF (TOWEL DISPOSABLE) ×1 IMPLANT
TOWEL OR 17X26 10 PK STRL BLUE (TOWEL DISPOSABLE) ×2 IMPLANT

## 2023-10-24 NOTE — Op Note (Signed)
OPERATIVE REPORT   10/24/2023  11:51 PM  PATIENT:  Ashley Galloway   SURGEON:  Jonette Pesa, MD  ASSISTANT:  Clint Bolder, PA-C.   PREOPERATIVE DIAGNOSIS: Necrotizing fasciitis left lower extremity.  POSTOPERATIVE DIAGNOSIS: Same.  PROCEDURE: 1.  Excisional debridement of subcutaneous tissue, fascia, and muscle left lower extremity. 2.  Placement of left lower extremity wound VAC.  ANESTHESIA:   GETA.  ANTIBIOTICS: Patient is receiving scheduled IV antibiotics.  IMPLANTS: None.  SPECIMENS:  1.  Left thigh abscess tissue swab for aerobic and anaerobic culture. 2.  Left thigh posterior compartment muscle for aerobic and anaerobic culture.  COMPLICATIONS: None.  DISPOSITION: Critical to ICU.  SURGICAL INDICATIONS:  Ashley Galloway is a 73 y.o. female with extensive necrotizing fasciitis involving the left thigh and lower leg.  CT scan of the left lower extremity from the pelvis through the ankle was personally reviewed by myself.  She is hemodynamically unstable.  She was indicated for emergent operative debridement of her left lower extremity as a lifesaving procedure.  I discussed the risks, benefits, and alternatives with the patient and her husband, and they elected to proceed.  The risks, benefits, and alternatives were discussed with the patient preoperatively including but not limited to the risks of infection, bleeding, nerve / blood vessel injury, cardiopulmonary complications, the need for repeat surgery, loss of limb, loss of life, among others, and the patient was willing to proceed.  PROCEDURE IN DETAIL: The patient was taken from the ICU to the operating room in critical condition.  The patient was identified with 2 identifiers.  She was placed supine on the operating room table.  General anesthesia was induced.  Foley catheter was already in place.  The left lower extremity was prepped and draped in the normal sterile surgical fashion.   Timeout was called, verifying side and site of surgery.  The patient has already been receiving scheduled IV antibiotics.  I began by making a longitudinal incision using a #10 blade from the lateral femoral condyle to the tip of the greater trochanter.  Dissection was performed until I reached the IT band.  Using digital dissection, I was able to circumferentially reach the medial thigh.  I encountered dishwasher, foul-smelling fluid.  I obtained a swab of the fluid, which was sent for aerobic and anaerobic culture.  On the medial aspect of the thigh, the fascia overlying the adductors was compromised.  I was able to digitally dissect proximally along the adductors to the outer aspect of the pelvis.  All devitalized fascial tissue was excisionally debrided with a rongeur.  Any obvious necrotic muscle tissue was also excisionally debrided with a rongeur.  At this point, I longitudinally incised the IT band.  I identified the vastus lateralis muscle, which I meticulously dissected off the posterior intermuscular septum.  All perforating vessels were treated with the aqua mantis.  I then subperiosteally dissected through the posterior septum of the femur.  Using digital dissection, I entered the posterior compartment of the thigh.  This wound then communicated with the superficial dissection previously performed on the medial thigh.  All necrotic posterior compartment muscle was excisionally debrided with a rongeur.  A representative sample of the muscle was sent for aerobic and anaerobic tissue culture.  I then turned my attention to the lower leg.  Over the medial aspect of the lower leg I created a longitudinal incision with a #10 blade approximately 2 cm posterior to the posterior border of the tibia.  Blunt digital dissection was performed until I reached the fascia overlying the posterior compartment.  The saphenous vein was identified and retracted anteriorly.  I then was able to easily digitally dissect  distally to the level of the medial malleolus and posterior laterally to the level of the lateral malleolus.  The same foul-smelling, dishwasher fluid was present throughout the subcutaneous level.  The subcutaneous space tunneled proximally to communicate with the previously reached area of the proximal incision.  The fascia was incised in line with the incision, and this was carried proximally and distally.  The muscle of the superficial compartment of the lower leg was completely healthy.  The soleus attachment was taken down off the posterior aspect of the tibia.  Using blunt dissection I proceeded proximally and distally.  There was no involvement at this level.  Once I was satisfied with complete fascial release, the left lower extremity was irrigated with 12 L of normal saline using pulsatile lavage.  Meticulous hemostasis was achieved using Bovie electrocautery and the aqua mantis.  No further necrotic fascia or muscle tissue was identified.  In the proximal wound, I placed a total of 3 black GranuFoam sponges.  The skin edges were loosely reapproximated with staples.  In the distal wound, I placed a total of 3 black GranuFoam sponges.  The wound VAC was then assembled.  The proximal and distal wounds were connected with a Y connector.  Suction was hooked up to 125 mmHg.  There was excellent seal without any leak.  A compressive dressing was placed with cast padding and Ace wrap.  The patient was then extubated and taken directly to the ICU in critical, but unchanged condition.  Sponge, needle, and instrument counts were correct at the end of the case x 2.  There were no known complications.  POSTOPERATIVE PLAN: Postoperatively, the patient will be readmitted to the ICU under the care of of TRH and critical care.  Continue IV antibiotics.  Follow intraoperative cultures.  The patient will need to be transferred to [facility] for further serial debridements.  I will discuss the patient with Dr.  Lajoyce Corners.

## 2023-10-24 NOTE — Transfer of Care (Signed)
Immediate Anesthesia Transfer of Care Note  Patient: Ashley Galloway  Procedure(s) Performed: IRRIGATION AND DEBRIDEMENT EXTREMITY Left Lower extremity (Left)  Patient Location: PACU  Anesthesia Type:General  Level of Consciousness: awake and oriented  Airway & Oxygen Therapy: Patient Spontanous Breathing and Patient connected to face mask oxygen  Post-op Assessment: Report given to RN and Post -op Vital signs reviewed and stable  Post vital signs: Reviewed and stable  Last Vitals:  Vitals Value Taken Time  BP 111/43 10/24/23 2345  Temp 36.4 C 10/24/23 2328  Pulse 77 10/24/23 2328  Resp 17 10/24/23 2328  SpO2 100 % 10/24/23 2328    Last Pain:  Vitals:   10/24/23 2328  TempSrc:   PainSc: Asleep      Patients Stated Pain Goal: 3 (10/24/23 1415)  Complications: No notable events documented.

## 2023-10-24 NOTE — Procedures (Signed)
Central Venous Catheter Insertion Procedure Note  Ashley Galloway  [id number]  [birth year]  Date:10/24/23  Time:8:26 PM   Provider Performing:Makiyla Linch Allen Norris   Procedure: Insertion of Non-tunneled Central Venous [phone]) with US guidance ([id number])   Indication(s) Medication administration  Consent Risks of the procedure as well as the alternatives and risks of each were explained to the patient and/or caregiver.  Consent for the procedure was obtained and is signed in the bedside chart  Anesthesia Topical only with 1% lidocaine   Timeout Verified patient identification, verified procedure, site/side was marked, verified correct patient position, special equipment/implants available, medications/allergies/relevant history reviewed, required imaging and test results available.  Sterile Technique Maximal sterile technique including full sterile barrier drape, hand hygiene, sterile gown, sterile gloves, mask, hair covering, sterile ultrasound probe cover (if used).  Procedure Description Area of catheter insertion was cleaned with chlorhexidine and draped in sterile fashion.  With real-time ultrasound guidance a central venous catheter was placed into the left internal jugular vein. Nonpulsatile blood flow and easy flushing noted in all ports.  The catheter was sutured in place and sterile dressing applied.  Complications/Tolerance None; patient tolerated the procedure well. Chest X-ray is ordered to verify placement for internal jugular or subclavian cannulation.   Chest x-ray is not ordered for femoral cannulation.  EBL Minimal  Specimen(s) None

## 2023-10-24 NOTE — Procedures (Signed)
  Procedure:  CT LLQ abscess drain catheter placement 60f; 5ml feculent aspirate sent for GS, C&S Preprocedure diagnosis: The primary encounter diagnosis was Necrotizing soft tissue infection. Diagnoses of Sepsis, due to unspecified organism, unspecified whether acute organ dysfunction present (HCC), Elevated troponin I level, and Colonic diverticular abscess were also pertinent to this visit. Postprocedure diagnosis: same EBL:    minimal Complications:   none immediate  See full dictation in [employer].  Thora Lance MD Main # [phone] Pager  [phone] Mobile [phone]

## 2023-10-24 NOTE — Progress Notes (Signed)
Pharmacy Antibiotic Note  Ashley Galloway is a 73 y.o. female admitted on 10/23/2023 with leg pain, SOB and fatigue .  Pharmacy has been consulted for zosyn dosing.  Plan: Zosyn 3.375g IV q8h (4 hour infusion). Follow renal function, cultures and clinical course  Weight: 71.3 kg (157 lb 3 oz)  Temp (24hrs), Avg:97.6 F (36.4 C), Min:97.4 F (36.3 C), Max:97.8 F (36.6 C)  Recent Labs  Lab 10/23/23 2337 10/23/23 2347 10/24/23 0158  WBC 17.7*  --   --   CREATININE 1.34*  --   --   LATICACIDVEN  --  6.0* 6.7*    Estimated Creatinine Clearance: 36.9 mL/min (A) (by C-G formula based on SCr of 1.34 mg/dL (H)).    Allergies  Allergen Reactions   Tape     Tape from hormone patch.    Antimicrobials this admission: 1/24 cefepime x 1 1/25 vanc x1 1/25 clinda >> 1/25 zosyn >> 1/25 linezolid >>  Dose adjustments this admission:   Microbiology results: 1/25 BCx:   Thank you for allowing pharmacy to be a part of this patient's care.  Arley Phenix RPh 10/24/2023, 2:52 AM

## 2023-10-24 NOTE — Consult Note (Signed)
Chief Complaint: Patient was seen in consultation today for abdominal abscess   Referring Physician(s): Dr. Romie Levee  Supervising Physician: Oley Balm  Patient Status: [facility] - In-pt  History of Present Illness: Ashley Galloway is a 73 y.o. female known to this IR service with recent hepatic abscess drain. Was seen in IR clinic on 1/22 and drain was removed. Pt was doing well at that time. Has now presented to ER with c/o LLQ and left upper leg/thigh pain. New CT imaging finds new large LLQ abscess with associated diverticulitis as well as fluid/air into abductor muscle concerning for myositis. General surgery consulted and request IR to place perc drain to abdominal abscess. Pt is febrile but has been started on IV abx.  PMHx, meds, labs, imaging, allergies reviewed. Has been NPO this am   Past Medical History:  Diagnosis Date   Centrilobular emphysema (HCC)    Colon polyps    Looks like saw Dr. Marca Ancona in 2021   Diverticulitis    HLD (hyperlipidemia)    TIA (transient ischemic attack)    Vesicointestinal fistula     Past Surgical History:  Procedure Laterality Date   CHOLECYSTECTOMY     IR GUIDED DRAIN W CATHETER PLACEMENT  09/16/2023   PARTIAL COLECTOMY     Tubulovillous adenoma   SPINE SURGERY      Allergies: Tape  Medications: Prior to Admission medications   Medication Sig Start Date End Date Taking? Authorizing Provider  aspirin EC 81 MG tablet Take 81 mg by mouth daily. Swallow whole.   Yes [provider]  cefadroxil (DURICEF) 500 MG capsule Take 1,000 mg by mouth 2 (two) times daily.   Yes [provider]  KLOR-CON M20 20 MEQ tablet Take 20 mEq by mouth daily.   Yes [provider]  latanoprost (XALATAN) 0.005 % ophthalmic solution Place 1 drop into both eyes at bedtime.   Yes [provider]  lovastatin (MEVACOR) 20 MG tablet Take 20 mg by mouth daily.   Yes [provider]   methocarbamol (ROBAXIN) 500 MG tablet Take 500 mg by mouth daily as needed for muscle spasms. 10/18/23  Yes [provider]  Multiple Vitamins-Minerals (CENTRUM SILVER WOMEN 50+) TABS Take 1 tablet by mouth daily.   Yes [provider]  ondansetron (ZOFRAN-ODT) 4 MG disintegrating tablet Take 1 tablet (4 mg total) by mouth every 8 (eight) hours as needed for nausea or vomiting. 10/19/23  Yes Kuppelweiser, Cassie L, RPH-CPP  Probiotic Product (PROBIOTIC PO) Take 1 capsule by mouth daily.   Yes [provider]  traMADol (ULTRAM) 50 MG tablet Take 50 mg by mouth 2 (two) times daily as needed for moderate pain (pain score 4-6) or severe pain (pain score 7-10). 10/22/23  Yes [provider]  triamterene-hydrochlorothiazide (DYAZIDE) 37.5-25 MG capsule Take 1 capsule by mouth every morning.   Yes [provider]  ALPRAZolam (XANAX) 0.25 MG tablet Take 0.25 mg by mouth daily. Patient not taking: Reported on 10/24/2023 10/22/23   [provider]  cefTRIAXone (ROCEPHIN) 2 g injection Inject 2 g into the muscle daily. For 5 days starting 10/05/23 Patient not taking: Reported on 10/24/2023    [provider]  HYDROcodone-acetaminophen (NORCO/VICODIN) 5-325 MG tablet Take 1 tablet by mouth every 12 (twelve) hours. Patient not taking: Reported on 10/24/2023 10/23/23   [provider]  predniSONE (STERAPRED UNI-PAK 21 TAB) 10 MG (21) TBPK tablet Take 10 mg by mouth as directed. Patient not taking: Reported  on 10/24/2023 10/22/23   [provider]  timolol (TIMOPTIC) 0.5 % ophthalmic solution Place 1 drop into both eyes 2 (two) times daily. Patient not taking: Reported on 10/24/2023 07/08/23   [provider]  traZODone (DESYREL) 50 MG tablet Take 1-2 tablets by mouth at bedtime. Patient not taking: Reported on 10/24/2023 04/23/23   [provider]     Family History  Problem Relation Age of Onset   Breast cancer Neg Hx      Social History   Socioeconomic History   Marital status: Married    Spouse name: Not on file   Number of children: Not on file   Years of education: Not on file   Highest education level: Not on file  Occupational History   Not on file  Tobacco Use   Smoking status: Every Day    Types: Cigarettes    Passive exposure: Current   Smokeless tobacco: Never  Substance and Sexual Activity   Alcohol use: Not on file   Drug use: Not on file   Sexual activity: Not on file  Other Topics Concern   Not on file  Social History Narrative   Not on file   Social Drivers of Health   Financial Resource Strain: Not on file  Food Insecurity: No Food Insecurity (09/15/2023)   Hunger Vital Sign    Worried About Running Out of Food in the Last Year: Never true    Ran Out of Food in the Last Year: Never true  Transportation Needs: No Transportation Needs (09/15/2023)   PRAPARE - Administrator, Civil Service (Medical): No    Lack of Transportation (Non-Medical): No  Physical Activity: Not on file  Stress: Not on file  Social Connections: Not on file    Review of Systems: A 12 point ROS discussed and pertinent positives are indicated in the HPI above.  All other systems are negative.  Review of Systems  Vital Signs: BP (!) 114/49   Pulse 95   Temp (!) 101.2 F (38.4 C)   Resp 20   Wt 157 lb 3 oz (71.3 kg)   SpO2 100%   BMI 24.62 kg/m   Physical Exam Constitutional:      Appearance: She is ill-appearing. She is not toxic-appearing.  HENT:     Mouth/Throat:     Mouth: Mucous membranes are moist.     Pharynx: Oropharynx is clear.  Cardiovascular:     Rate and Rhythm: Normal rate and regular rhythm.     Heart sounds: Normal heart sounds.  Pulmonary:     Effort: Pulmonary effort is normal. No respiratory distress.     Breath sounds: Normal breath sounds.  Abdominal:     Palpations: Abdomen is soft.     Tenderness: There is abdominal tenderness.     Comments:  Tender LLQ abd  Skin:    General: Skin is warm and dry.  Neurological:     General: No focal deficit present.     Mental Status: She is alert and oriented to person, place, and time.  Psychiatric:        Mood and Affect: Mood normal.        Thought Content: Thought content normal.     Imaging: CT Angio Chest/Abd/Pel for Dissection W and/or Wo Contrast Result Date: 10/24/2023 CLINICAL DATA:  Septic arterial embolism. Left leg pain and swelling. History of liver abscess drained on 08/20/2024. EXAM: CT ANGIOGRAPHY CHEST, ABDOMEN AND PELVIS TECHNIQUE: Non-contrast CT of  the chest was initially obtained. Multidetector CT imaging through the chest, abdomen and pelvis was performed using the standard protocol during bolus administration of intravenous contrast. Multiplanar reconstructed images and MIPs were obtained and reviewed to evaluate the vascular anatomy. RADIATION DOSE REDUCTION: This exam was performed according to the departmental dose-optimization program which includes automated exposure control, adjustment of the mA and/or kV according to patient size and/or use of iterative reconstruction technique. CONTRAST:  OMNIPAQUE IOHEXOL 350 MG/ML SOLN COMPARISON:  CT abdomen 10/21/2023. CT abdomen and pelvis 09/14/2023. FINDINGS: CTA CHEST FINDINGS Cardiovascular: Preferential opacification of the thoracic aorta. No evidence of thoracic aortic aneurysm or dissection. Normal heart size. No pericardial effusion. There is calcified atherosclerotic disease throughout the aorta. Bovine arch anatomy is noted. Mediastinum/Nodes: No enlarged mediastinal, hilar, or axillary lymph nodes. Thyroid gland, trachea, and esophagus demonstrate no significant findings. Lungs/Pleura: Are minimal atelectatic changes in the bilateral lower lobes and lingula. There is no pleural effusion or pneumothorax. Musculoskeletal: No chest wall abnormality. No acute or significant osseous findings. Review of the MIP images  confirms the above findings. CTA ABDOMEN AND PELVIS FINDINGS VASCULAR Aorta: Normal caliber aorta without aneurysm, dissection, vasculitis or significant stenosis. There is calcified atherosclerotic disease throughout the aorta. 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 Otherwise, the celiac artery and its branches appear within normal limits. SMA: Patent without evidence of aneurysm, dissection, vasculitis or significant stenosis. Renals: There is mild-to-moderate focal stenosis of the origin of the left renal artery secondary to atherosclerotic disease. Renal arteries are otherwise within normal limits. IMA: Patent without evidence of aneurysm, dissection, vasculitis or significant stenosis. Inflow: Patent without evidence of aneurysm, dissection, vasculitis or significant stenosis. Calcified atherosclerosis is present. Other: There is moderate focal stenosis in the proximal left superficial femoral artery. There severe focal stenosis and possible focal thrombosis in the proximal right profunda femoral artery. Veins: No obvious venous abnormality within the limitations of this arterial phase study. Review of the MIP images confirms the above findings. NON-VASCULAR Hepatobiliary: Percutaneous drainage catheter has been removed. Ill-defined heterogeneous hypodensity is again seen in the central liver measuring proximally 5.9 x 3.8 cm similar to the prior examination. Rounded hypodensity in the dome of the liver is unchanged and favored as a cyst. Gallbladder is surgically absent. There stable common bile duct dilatation. Pancreas: Unremarkable. No pancreatic ductal dilatation or surrounding inflammatory changes. Spleen: Normal in size without focal abnormality. Adrenals/Urinary Tract: There some bilateral adrenal thickening which is new from prior. There is no hydronephrosis or perinephric stranding. There is a cyst in the superior pole the right kidney  measuring 1.7 cm. Bladder is within normal limits. Stomach/Bowel: There is no evidence for bowel obstruction, pneumatosis or free air. There is diffuse colonic diverticulosis. There is a large amount of stool throughout the colon. The appendix is not seen. There is wall thickening of the mid sigmoid colon without surrounding inflammatory stranding. There is a new collection containing presumed fecal matter and air in the left lower quadrant measuring 6.8 x 6.2 by 6.9 cm. This is inferior to and abutting the sigmoid colon without surrounding inflammation. Small bowel and stomach are within normal limits. Lymphatic: No enlarged lymph nodes are identified. Reproductive: Small calcified fibroids are present. Adnexa are within normal limits. Other: No abdominal wall hernia or abnormality. No abdominopelvic ascites. Musculoskeletal: No acute osseous abnormality. There is soft tissue gas and edema within the abductor thigh musculature and anterior thigh musculature.  Review of the MIP images confirms the above findings. IMPRESSION: VASCULAR 1. No evidence for aortic dissection or aneurysm. 2. 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 3. Moderate focal stenosis in the proximal left superficial femoral artery. 4. Severe focal stenosis and possible focal thrombosis in the proximal right profunda femoral artery. Aortic Atherosclerosis (ICD10-I70.0). NON_VASCULAR 1. New 6.9 cm collection containing presumed fecal matter and air in the left lower quadrant abutting the sigmoid colon. Findings may represent a contained perforation of diverticulum with abscess formation. Please correlate clinically. 2. Wall thickening of the mid sigmoid colon without surrounding inflammatory stranding. Findings may be related to colitis/diverticulitis. Underlying lesion can not be excluded. 3. Large amount of stool throughout the colon. 4. Soft tissue gas and edema within the left abductor thigh musculature and anterior thigh  musculature worrisome for infection/myositis/fasciitis including necrotizing fasciitis. 5. Stable ill-defined hypodensity in the central liver favored as abscess. 6. New bilateral adrenal thickening. These results were called by telephone at the time of interpretation on 10/24/2023 at 1:38 am to provider [facility] , who verbally acknowledged these results. Electronically Signed   By: Darliss Cheney M.D.   On: 10/24/2023 01:41   DG Chest Port 1 View Result Date: 10/23/2023 CLINICAL DATA:  Questionable sepsis EXAM: PORTABLE CHEST 1 VIEW COMPARISON:  Chest x-ray 09/14/2023 FINDINGS: The heart size and mediastinal contours are within normal limits. Both lungs are clear. The visualized skeletal structures are unremarkable. IMPRESSION: No active disease. Electronically Signed   By: Darliss Cheney M.D.   On: 10/23/2023 23:49   CT ABDOMEN W CONTRAST Result Date: 10/21/2023 CLINICAL DATA:  73 year old female with history of hepatic abscess status post percutaneous drain placement on 09/16/2023 presenting for initial drain follow-up. EXAM: CT ABDOMEN WITH CONTRAST TECHNIQUE: Multidetector CT imaging of the abdomen was performed using the standard protocol following bolus administration of intravenous contrast. RADIATION DOSE REDUCTION: This exam was performed according to the departmental dose-optimization program which includes automated exposure control, adjustment of the mA and/or kV according to patient size and/or use of iterative reconstruction technique. CONTRAST:  75mL OMNIPAQUE IOHEXOL 350 MG/ML SOLN COMPARISON:  09/14/2023, 09/15/2023 FINDINGS: Lower chest: No acute abnormality. Hepatobiliary: Significantly decreased size of previously visualized right lobe hepatic abscess, now with ill-defined borders measuring approximately 4.9 x 5.0 x 3.4 cm (AP by trans by CC, previously measuring up to approximately 12.5 cm. Percutaneous hepatic fluid collection pigtail drainage catheter in unchanged position. Unchanged  right hepatic dome subcentimeter simple hepatic cyst. The liver is otherwise within normal limits. Postsurgical changes after cholecystectomy. Mild compensatory extrahepatic biliary ductal dilation. Pancreas: Unremarkable. No pancreatic ductal dilatation or surrounding inflammatory changes. Spleen: Normal in size without focal abnormality. Adrenals/Urinary Tract: Adrenal glands are unremarkable. Unchanged simple cyst in the right kidney, not requiring additional follow-up. Kidneys are normal, without renal calculi, focal lesion, or hydronephrosis. Bladder is unremarkable. Stomach/Bowel: Stomach is within normal limits. Scattered colonic diverticula, most prominent in the visualized proximal descending colon. Evidence of bowel wall thickening, distention, or inflammatory changes. Vascular/Lymphatic: Aortic atherosclerosis. No enlarged abdominal or pelvic lymph nodes. Other: Similar appearing small, uncomplicated umbilical hernia. Musculoskeletal: No acute osseous abnormality. Similar appearing multilevel degenerative changes of the visualized thoracolumbar spine. IMPRESSION: 1. Significantly decreased size and decreased conspicuity of previously visualized right hepatic abscess with unchanged position of indwelling percutaneous drainage catheter. 2. Diverticulosis. 3. Aortic atherosclerosis. Aortic Atherosclerosis (ICD10-I70.0). Marliss Coots, MD [facility] Electronically Signed   By: Jae Dire.D.  On: 10/21/2023 21:54   ECHOCARDIOGRAM COMPLETE Result Date: 10/05/2023    ECHOCARDIOGRAM REPORT   Patient Name:   JAYLENN BAIZA Date of Exam: 10/05/2023 Medical Rec #:  [id number]                 Height:       67.0 in Accession #:    [id number]                Weight:       186.1 lb Date of Birth:  [birth year]                 BSA:          1.961 m Patient Age:    72 years                  BP:           127/78 mmHg Patient Gender: F                          HR:           56 bpm. Exam Location:  [address] Procedure: 2D Echo, Cardiac Doppler and Color Doppler Indications:    R78.81 Bacteremia  History:        Patient has no prior history of Echocardiogram examinations.                 Bacteremia; Risk Factors:Hypertension, Dyslipidemia and Current                 Smoker.  Sonographer:    Samule Ohm RDCS Referring Phys: [id number] RONALD POLITE IMPRESSIONS  1. Left ventricular ejection fraction, by estimation, is 60 to 65%. The left ventricle has normal function. The left ventricle has no regional wall motion abnormalities. There is mild concentric left ventricular hypertrophy. Left ventricular diastolic parameters are consistent with Grade I diastolic dysfunction (impaired relaxation).  2. Right ventricular systolic function is normal. The right ventricular size is normal.  3. Left atrial size was mildly dilated.  4. The mitral valve is normal in structure. Trivial mitral valve regurgitation. No evidence of mitral stenosis.  5. The aortic valve is tricuspid. There is mild calcification of the aortic valve. Aortic valve regurgitation is mild. Aortic valve sclerosis/calcification is present, without any evidence of aortic stenosis.  6. The inferior vena cava is normal in size with greater than 50% respiratory variability, suggesting right atrial pressure of 3 mmHg. FINDINGS  Left Ventricle: Left ventricular ejection fraction, by estimation, is 60 to 65%. The left ventricle has normal function. The left ventricle has no regional wall motion abnormalities. The left ventricular internal cavity size was normal in size. There is  mild concentric left ventricular hypertrophy. Left ventricular diastolic parameters are consistent with Grade I diastolic dysfunction (impaired relaxation). Right Ventricle: The right ventricular size is normal. No increase in right ventricular wall thickness. Right ventricular systolic function is normal. Left Atrium: Left atrial size was  mildly dilated. Right Atrium: Right atrial size was normal in size. Pericardium: There is no evidence of pericardial effusion. Mitral Valve: The mitral valve is normal in structure. Trivial mitral valve regurgitation. No evidence of mitral valve stenosis. Tricuspid Valve: The tricuspid valve is normal in structure. Tricuspid valve regurgitation is trivial. No evidence of tricuspid stenosis. Aortic Valve: The aortic valve is tricuspid. There is mild calcification of the aortic valve. Aortic valve regurgitation is mild. Aortic regurgitation PHT measures 477 msec.  Aortic valve sclerosis/calcification is present, without any evidence of aortic stenosis. Pulmonic Valve: The pulmonic valve was normal in structure. Pulmonic valve regurgitation is not visualized. No evidence of pulmonic stenosis. Aorta: The aortic root is normal in size and structure. Venous: The inferior vena cava is normal in size with greater than 50% respiratory variability, suggesting right atrial pressure of 3 mmHg. IAS/Shunts: No atrial level shunt detected by color flow Doppler.  LEFT VENTRICLE PLAX 2D LVIDd:         4.30 cm   Diastology LVIDs:         2.40 cm   LV e' medial:    9.31 cm/s LV PW:         0.90 cm   LV E/e' medial:  10.2 LV IVS:        1.20 cm   LV e' lateral:   10.53 cm/s LVOT diam:     1.80 cm   LV E/e' lateral: 9.0 LV SV:         70 LV SV Index:   36 LVOT Area:     2.54 cm  RIGHT VENTRICLE            IVC RVSP:           36.6 mmHg  IVC diam: 1.30 cm LEFT ATRIUM             Index        RIGHT ATRIUM           Index LA diam:        3.80 cm 1.94 cm/m   RA Pressure: 3.00 mmHg LA Vol (A2C):   28.5 ml 14.53 ml/m  RA Area:     8.26 cm LA Vol (A4C):   53.6 ml 27.33 ml/m  RA Volume:   14.50 ml  7.39 ml/m LA Biplane Vol: 42.3 ml 21.57 ml/m  AORTIC VALVE LVOT Vmax:   140.40 cm/s LVOT Vmean:  79.380 cm/s LVOT VTI:    0.275 m AI PHT:      477 msec  AORTA Ao Root diam: 2.80 cm Ao Asc diam:  3.30 cm MITRAL VALVE               TRICUSPID VALVE  MV Area (PHT): 3.03 cm    TR Peak grad:   33.6 mmHg MV Decel Time: 250 msec    TR Vmax:        290.00 cm/s MV E velocity: 95.00 cm/s  Estimated RAP:  3.00 mmHg MV A velocity: 93.20 cm/s  RVSP:           36.6 mmHg MV E/A ratio:  1.02                            SHUNTS                            Systemic VTI:  0.28 m                            Systemic Diam: 1.80 cm Arvilla Meres MD Electronically signed by Arvilla Meres MD Signature Date/Time: 10/05/2023/2:29:12 PM    Final     Labs:  CBC: Recent Labs    09/18/23 0700 09/19/23 0129 10/23/23 2337 10/24/23 0410  WBC 20.5* 24.4* 17.7* 11.2*  HGB 12.8 10.9* 14.8 12.1  HCT 36.4 32.2* 45.9 37.4  PLT 260 281 606* 456*    COAGS: Recent Labs    09/16/23 1526 10/23/23 2337  INR 1.3* 1.6*  APTT  --  24    BMP: Recent Labs    09/18/23 0700 09/19/23 0129 10/23/23 2337 10/24/23 0410  NA 135 136 131* 130*  K 3.6 3.5 5.2* 4.2  CL 104 106 96* 98  CO2 22 23 19* 20*  GLUCOSE 98 100* 181* 140*  BUN 21 20 59* 51*  CALCIUM 8.5* 8.1* 9.6 8.4*  CREATININE 0.83 0.84 1.34* 0.98  GFRNONAA >60 >60 42* >60    LIVER FUNCTION TESTS: Recent Labs    09/18/23 0700 09/19/23 0129 10/23/23 2337 10/24/23 0410  BILITOT 0.8 0.6 1.3* 0.7  AST 40 28 64* 51*  ALT 70* 45* 36 30  ALKPHOS 88 68 119 86  PROT 5.8* 5.1* 8.0 5.8*  ALBUMIN 1.9* 1.6* 2.2* 1.6*     Assessment and Plan: LLQ abd abscess/diverticulitis For image guided perc drain today. Labs reviewed. Risks and benefits discussed with the patient including bleeding, infection, damage to adjacent structures, bowel perforation/fistula connection, and sepsis.  All of the patient's questions were answered, patient is agreeable to proceed. Consent signed and in chart.    Electronically Signed: Brayton El, PA-C 10/24/2023, 10:10 AM   I spent a total of 30 minutes in face to face in clinical consultation, greater than 50% of which was counseling/coordinating care for perc abscess  drain

## 2023-10-24 NOTE — Progress Notes (Signed)
PROGRESS NOTE    Ashley Galloway  ZOX:[id number] DOB: 06-11-1951 DOA: 10/23/2023 PCP: Renford Dills, MD   Brief Narrative:  Ashley Galloway is a 73 y.o. female with recent Streptococcus intermedius bacteremia, hepatic abscess, and diverticulitis who completed antibiotics, patient had hepatic abscess percutaneous drain removed by ID on 10/20/2022 given improvement in output at the time.  Patient presents to the hospital with worsening left thigh pain swelling and blanching erythema.  Initial imaging shows a large left lower quadrant fluid-filled collection concerning for abscess with possible involvement of the anterior compartment of the left leg concerning for myositis.  General surgery and interventional radiology called in consult.  Hospitalist called for admission.  Assessment & Plan:   Principal Problem:   Diverticulitis of large intestine with abscess Active Problems:   Infective myositis of left thigh   AKI (acute kidney injury) (HCC)   PAD (peripheral artery disease) (HCC)   Hyperglycemia  Severe sepsis secondary to presumed intra-abdominal abscess/diverticulitis Left thigh soft tissue infection versus myositis Lactic acidosis Complicated by recent strep intermedius bacteremia, hepatic abscess and chronic diverticulosis -Sepsis criteria in the setting of leukocytosis tachycardia and fever, notable source on imaging -General Surgery and IR following, plan in the interim is for percutaneous drain, follow fluid culture and sensitivities -Initial concern for necrotizing fasciitis in the setting of recent drain removal and known Streptococcus intermedius bacteremia and hepatic abscess.  Review overnight by general surgery notes this to be inconsistent with necrotizing fasciitis. -Continue sepsis protocol, IV fluids ongoing in setting of profound lactic acidosis -Antibiotics ongoing with linezolid, Zosyn and clindamycin given questionable source of infection in the  setting of recent drain placement and fluid removal.   AKI  - SCr is 1.34, up from baseline of 0.8  -Continue IV fluids in the setting of above, downtrending appropriately  Acute metabolic encephalopathy  -Secondary to above -No narcotics over the past 6 hours, likely not playing a major role in patient's mentation  hyperglycemia Pre-DM  - Likely acutely reactive although A1C 6.3 is worrisome for impending DM - discussed need for improved diet/appropriate lifestyle changes -No need for sliding scale at this time - stop POC glucose checks   Elevated INR  -Likely secondary to prior hepatic abscess and infection -Liver panel generally unremarkable  Anxiety -currently well-controlled Lumbar stenosis -no current complaints Peripheral arterial disease, chronic - Noted on CTA, outpatient vascular surgery follow-up recommended    DVT prophylaxis: SCDs Start: 10/24/23 0221   Code Status:   Code Status: Full Code  Family Communication: At bedside  Status is: Inpatient  Dispo: The patient is from: Home              Anticipated d/c is to: Home              Anticipated d/c date is: 48-72 hours              Patient currently not medically stable for discharge  Consultants:  General surgery, IR  Procedures:  Pending percutaneous drain 10/24/2023  Antimicrobials:  Linezolid, clindamycin, Zosyn.  Subjective: No acute issues or events overnight, patient continues to have tachycardia tachypnea with poorly controlled pain per husband at bedside.  Awaiting intervention with radiology, in the interim review of systems limited due to patient's mental status  Objective: Vitals:   10/24/23 0530 10/24/23 0545 10/24/23 0630 10/24/23 0700  BP: (!) 100/54 (!) 106/54 (!) 108/49 (!) 101/51  Pulse: 94 94 91 90  Resp: (!) 23 20 (!) 23 20  Temp: 99.7 F (37.6 C) 99.9 F (37.7 C) 100.3 F (37.9 C) (!) 100.4 F (38 C)  TempSrc:      SpO2: 100% 100% 100% 100%  Weight:        Intake/Output  Summary (Last 24 hours) at 10/24/2023 0747 Last data filed at 10/24/2023 0547 Gross per 24 hour  Intake 3000 ml  Output --  Net 3000 ml   Filed Weights   10/23/23 2328  Weight: 71.3 kg    Examination:  General:  Pleasantly resting in bed, No acute distress. HEENT:  Normocephalic atraumatic.  Sclerae nonicteric, noninjected.  Extraocular movements intact bilaterally. Neck:  Without mass or deformity.  Trachea is midline. Lungs:  Clear to auscultate bilaterally without rhonchi, wheeze, or rales. Heart: Tachycardic without overt murmurs rubs or gallops. Abdomen:  Soft, nontender, nondistended.  Without guarding or rebound. Extremities: Left proximal anterior thigh with profound sensitivity to light touch and deep palpation, noted erythematous region on the medial aspect. Skin:  Warm and dry; erythema medial left proximal thigh as above  Data Reviewed: I have personally reviewed following labs and imaging studies  CBC: Recent Labs  Lab 10/23/23 2337 10/24/23 0410  WBC 17.7* 11.2*  NEUTROABS 14.5*  --   HGB 14.8 12.1  HCT 45.9 37.4  MCV 86.4 87.4  PLT 606* 456*   Basic Metabolic Panel: Recent Labs  Lab 10/23/23 2337 10/24/23 0410  NA 131* 130*  K 5.2* 4.2  CL 96* 98  CO2 19* 20*  GLUCOSE 181* 140*  BUN 59* 51*  CREATININE 1.34* 0.98  CALCIUM 9.6 8.4*   GFR: Estimated Creatinine Clearance: 50.5 mL/min (by C-G formula based on SCr of 0.98 mg/dL). Liver Function Tests: Recent Labs  Lab 10/23/23 2337 10/24/23 0410  AST 64* 51*  ALT 36 30  ALKPHOS 119 86  BILITOT 1.3* 0.7  PROT 8.0 5.8*  ALBUMIN 2.2* 1.6*   Coagulation Profile: Recent Labs  Lab 10/23/23 2337  INR 1.6*   HbA1C: Recent Labs    10/24/23 0410  HGBA1C 6.3*   CBG: Recent Labs  Lab 10/24/23 0409  GLUCAP 118*   Sepsis Labs: Recent Labs  Lab 10/23/23 2347 10/24/23 0158 10/24/23 0410  LATICACIDVEN 6.0* 6.7* 5.9*    Recent Results (from the past 240 hours)  Resp panel by RT-PCR  (RSV, Flu A&B, Covid) Anterior Nasal Swab     Status: None   Collection Time: 10/23/23 11:37 PM   Specimen: Anterior Nasal Swab  Result Value Ref Range Status   SARS Coronavirus 2 by RT PCR NEGATIVE NEGATIVE Final    Comment: (NOTE) SARS-CoV-2 target nucleic acids are NOT DETECTED.  The SARS-CoV-2 RNA is generally detectable in upper respiratory specimens during the acute phase of infection. The lowest concentration of SARS-CoV-2 viral copies this assay can detect is 138 copies/mL. A negative result does not preclude SARS-Cov-2 infection and should not be used as the sole basis for treatment or other patient management decisions. A negative result may occur with  improper specimen collection/handling, submission of specimen other than nasopharyngeal swab, presence of viral mutation(s) within the areas targeted by this assay, and inadequate number of viral copies(<138 copies/mL). A negative result must be combined with clinical observations, patient history, and epidemiological information. The expected result is Negative.  Fact Sheet for Patients:  BloggerCourse.com  Fact Sheet for Healthcare Providers:  SeriousBroker.it  This test is no t yet approved or cleared by the [place] FDA and  has been authorized for detection and/or  diagnosis of SARS-CoV-2 by FDA under an Emergency Use Authorization (EUA). This EUA will remain  in effect (meaning this test can be used) for the duration of the COVID-19 declaration under Section 564(b)(1) of the Act, 21 U.S.C.section 360bbb-3(b)(1), unless the authorization is terminated  or revoked sooner.       Influenza A by PCR NEGATIVE NEGATIVE Final   Influenza B by PCR NEGATIVE NEGATIVE Final    Comment: (NOTE) The Xpert Xpress SARS-CoV-2/FLU/RSV plus assay is intended as an aid in the diagnosis of influenza from Nasopharyngeal swab specimens and should not be used as a sole basis for  treatment. Nasal washings and aspirates are unacceptable for Xpert Xpress SARS-CoV-2/FLU/RSV testing.  Fact Sheet for Patients: BloggerCourse.com  Fact Sheet for Healthcare Providers: SeriousBroker.it  This test is not yet approved or cleared by the [place] FDA and has been authorized for detection and/or diagnosis of SARS-CoV-2 by FDA under an Emergency Use Authorization (EUA). This EUA will remain in effect (meaning this test can be used) for the duration of the COVID-19 declaration under Section 564(b)(1) of the Act, 21 U.S.C. section 360bbb-3(b)(1), unless the authorization is terminated or revoked.     Resp Syncytial Virus by PCR NEGATIVE NEGATIVE Final    Comment: (NOTE) Fact Sheet for Patients: BloggerCourse.com  Fact Sheet for Healthcare Providers: SeriousBroker.it  This test is not yet approved or cleared by the [place] FDA and has been authorized for detection and/or diagnosis of SARS-CoV-2 by FDA under an Emergency Use Authorization (EUA). This EUA will remain in effect (meaning this test can be used) for the duration of the COVID-19 declaration under Section 564(b)(1) of the Act, 21 U.S.C. section 360bbb-3(b)(1), unless the authorization is terminated or revoked.  Performed at [facility], [address] [place], [place] [address]          Radiology Studies: CT Angio Chest/Abd/Pel for Dissection W and/or Wo Contrast Result Date: 10/24/2023 CLINICAL DATA:  Septic arterial embolism. Left leg pain and swelling. History of liver abscess drained on 08/20/2024. EXAM: CT ANGIOGRAPHY CHEST, ABDOMEN AND PELVIS TECHNIQUE: Non-contrast CT of the chest was initially obtained. Multidetector CT imaging through the chest, abdomen and pelvis was performed using the standard protocol during bolus administration of intravenous contrast. Multiplanar  reconstructed images and MIPs were obtained and reviewed to evaluate the vascular anatomy. RADIATION DOSE REDUCTION: This exam was performed according to the departmental dose-optimization program which includes automated exposure control, adjustment of the mA and/or kV according to patient size and/or use of iterative reconstruction technique. CONTRAST:  OMNIPAQUE IOHEXOL 350 MG/ML SOLN COMPARISON:  CT abdomen 10/21/2023. CT abdomen and pelvis 09/14/2023. FINDINGS: CTA CHEST FINDINGS Cardiovascular: Preferential opacification of the thoracic aorta. No evidence of thoracic aortic aneurysm or dissection. Normal heart size. No pericardial effusion. There is calcified atherosclerotic disease throughout the aorta. Bovine arch anatomy is noted. Mediastinum/Nodes: No enlarged mediastinal, hilar, or axillary lymph nodes. Thyroid gland, trachea, and esophagus demonstrate no significant findings. Lungs/Pleura: Are minimal atelectatic changes in the bilateral lower lobes and lingula. There is no pleural effusion or pneumothorax. Musculoskeletal: No chest wall abnormality. No acute or significant osseous findings. Review of the MIP images confirms the above findings. CTA ABDOMEN AND PELVIS FINDINGS VASCULAR Aorta: Normal caliber aorta without aneurysm, dissection, vasculitis or significant stenosis. There is calcified atherosclerotic disease throughout the aorta. 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 Otherwise, the celiac artery and its branches  appear within normal limits. SMA: Patent without evidence of aneurysm, dissection, vasculitis or significant stenosis. Renals: There is mild-to-moderate focal stenosis of the origin of the left renal artery secondary to atherosclerotic disease. Renal arteries are otherwise within normal limits. IMA: Patent without evidence of aneurysm, dissection, vasculitis or significant stenosis. Inflow: Patent without evidence of  aneurysm, dissection, vasculitis or significant stenosis. Calcified atherosclerosis is present. Other: There is moderate focal stenosis in the proximal left superficial femoral artery. There severe focal stenosis and possible focal thrombosis in the proximal right profunda femoral artery. Veins: No obvious venous abnormality within the limitations of this arterial phase study. Review of the MIP images confirms the above findings. NON-VASCULAR Hepatobiliary: Percutaneous drainage catheter has been removed. Ill-defined heterogeneous hypodensity is again seen in the central liver measuring proximally 5.9 x 3.8 cm similar to the prior examination. Rounded hypodensity in the dome of the liver is unchanged and favored as a cyst. Gallbladder is surgically absent. There stable common bile duct dilatation. Pancreas: Unremarkable. No pancreatic ductal dilatation or surrounding inflammatory changes. Spleen: Normal in size without focal abnormality. Adrenals/Urinary Tract: There some bilateral adrenal thickening which is new from prior. There is no hydronephrosis or perinephric stranding. There is a cyst in the superior pole the right kidney measuring 1.7 cm. Bladder is within normal limits. Stomach/Bowel: There is no evidence for bowel obstruction, pneumatosis or free air. There is diffuse colonic diverticulosis. There is a large amount of stool throughout the colon. The appendix is not seen. There is wall thickening of the mid sigmoid colon without surrounding inflammatory stranding. There is a new collection containing presumed fecal matter and air in the left lower quadrant measuring 6.8 x 6.2 by 6.9 cm. This is inferior to and abutting the sigmoid colon without surrounding inflammation. Small bowel and stomach are within normal limits. Lymphatic: No enlarged lymph nodes are identified. Reproductive: Small calcified fibroids are present. Adnexa are within normal limits. Other: No abdominal wall hernia or abnormality. No  abdominopelvic ascites. Musculoskeletal: No acute osseous abnormality. There is soft tissue gas and edema within the abductor thigh musculature and anterior thigh musculature. Review of the MIP images confirms the above findings. IMPRESSION: VASCULAR 1. No evidence for aortic dissection or aneurysm. 2. 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 3. Moderate focal stenosis in the proximal left superficial femoral artery. 4. Severe focal stenosis and possible focal thrombosis in the proximal right profunda femoral artery. Aortic Atherosclerosis (ICD10-I70.0). NON_VASCULAR 1. New 6.9 cm collection containing presumed fecal matter and air in the left lower quadrant abutting the sigmoid colon. Findings may represent a contained perforation of diverticulum with abscess formation. Please correlate clinically. 2. Wall thickening of the mid sigmoid colon without surrounding inflammatory stranding. Findings may be related to colitis/diverticulitis. Underlying lesion can not be excluded. 3. Large amount of stool throughout the colon. 4. Soft tissue gas and edema within the left abductor thigh musculature and anterior thigh musculature worrisome for infection/myositis/fasciitis including necrotizing fasciitis. 5. Stable ill-defined hypodensity in the central liver favored as abscess. 6. New bilateral adrenal thickening. These results were called by telephone at the time of interpretation on 10/24/2023 at 1:38 am to provider [facility] , who verbally acknowledged these results. Electronically Signed   By: Darliss Cheney M.D.   On: 10/24/2023 01:41   DG Chest Port 1 View Result Date: 10/23/2023 CLINICAL DATA:  Questionable sepsis EXAM: PORTABLE CHEST 1 VIEW COMPARISON:  Chest x-ray 09/14/2023 FINDINGS: The heart size and mediastinal contours are  within normal limits. Both lungs are clear. The visualized skeletal structures are unremarkable. IMPRESSION: No active disease. Electronically Signed   By: Darliss Cheney M.D.   On: 10/23/2023 23:49        Scheduled Meds:  insulin aspart  0-6 Units Subcutaneous Q4H   sodium chloride flush  3 mL Intravenous Q12H   Continuous Infusions:  clindamycin (CLEOCIN) IV 600 mg (10/24/23 1610)   lactated ringers 150 mL/hr at 10/24/23 0620   linezolid (ZYVOX) IV     piperacillin-tazobactam (ZOSYN)  IV       LOS: 0 days   Time spent:  Azucena Fallen, DO [facility]  If 7PM-7AM, please contact night-coverage [email or web address]  10/24/2023, 7:47 AM

## 2023-10-24 NOTE — Consult Note (Signed)
ORTHOPAEDIC CONSULTATION  REQUESTING PHYSICIAN: Azucena Fallen, MD  PCP:  Renford Dills, MD  Chief Complaint: left lower extremity necrotizing fasciitis  HPI: Ashley Galloway is a 73 y.o. female who was admitted last night for left lower quadrant diverticulitis with abscess.  She had been complaining of left thigh pain and swelling.  Workup in the emergency department revealed left lower quadrant abscess.  General surgery consultation was placed.  She was admitted by [facility].  She was started on IV antibiotics.  She went to interventional radiology this afternoon where a percutaneous drain was placed.  Since the procedure, her left lower extremity pain and swelling have worsened.  Repeat CT scan of the left lower extremity was obtained, revealing subcutaneous air from the anterior thigh tracking throughout the lower extremity to the distal lower leg.  I was consulted by Dr. Natale Milch for concern of necrotizing fasciitis.  At the time of exam, she is complaining of left thigh pain.  She is currently tachycardic in the 130s/140s.  She is on Levophed drip.  I reviewed the CT personally.   Past Medical History:  Diagnosis Date   Centrilobular emphysema (HCC)    Colon polyps    Looks like saw Dr. Marca Ancona in 2021   Diverticulitis    HLD (hyperlipidemia)    TIA (transient ischemic attack)    Vesicointestinal fistula    Past Surgical History:  Procedure Laterality Date   CHOLECYSTECTOMY     IR GUIDED DRAIN W CATHETER PLACEMENT  09/16/2023   PARTIAL COLECTOMY     Tubulovillous adenoma   SPINE SURGERY     Social History   Socioeconomic History   Marital status: Married    Spouse name: Not on file   Number of children: Not on file   Years of education: Not on file   Highest education level: Not on file  Occupational History   Not on file  Tobacco Use   Smoking status: Every Day    Types: Cigarettes    Passive exposure: Current   Smokeless tobacco: Never  Substance  and Sexual Activity   Alcohol use: Not on file   Drug use: Not on file   Sexual activity: Not on file  Other Topics Concern   Not on file  Social History Narrative   Not on file   Social Drivers of Health   Financial Resource Strain: Not on file  Food Insecurity: No Food Insecurity (09/15/2023)   Hunger Vital Sign    Worried About Running Out of Food in the Last Year: Never true    Ran Out of Food in the Last Year: Never true  Transportation Needs: No Transportation Needs (09/15/2023)   PRAPARE - Administrator, Civil Service (Medical): No    Lack of Transportation (Non-Medical): No  Physical Activity: Not on file  Stress: Not on file  Social Connections: Not on file   Family History  Problem Relation Age of Onset   Breast cancer Neg Hx    Allergies  Allergen Reactions   Tape Other (See Comments)    Tape from hormone patch.   Prior to Admission medications   Medication Sig Start Date End Date Taking? Authorizing Provider  aspirin EC 81 MG tablet Take 81 mg by mouth daily. Swallow whole.   Yes [provider]  cefadroxil (DURICEF) 500 MG capsule Take 1,000 mg by mouth 2 (two) times daily.   Yes [provider]  KLOR-CON M20 20 MEQ tablet Take  20 mEq by mouth daily.   Yes [provider]  latanoprost (XALATAN) 0.005 % ophthalmic solution Place 1 drop into both eyes at bedtime.   Yes [provider]  lovastatin (MEVACOR) 20 MG tablet Take 20 mg by mouth daily.   Yes [provider]  methocarbamol (ROBAXIN) 500 MG tablet Take 500 mg by mouth daily as needed for muscle spasms. 10/18/23  Yes [provider]  Multiple Vitamins-Minerals (CENTRUM SILVER WOMEN 50+) TABS Take 1 tablet by mouth daily.   Yes [provider]  ondansetron (ZOFRAN-ODT) 4 MG disintegrating tablet Take 1 tablet (4 mg total) by mouth every 8 (eight) hours as needed for nausea or vomiting. 10/19/23  Yes Kuppelweiser, Cassie L, RPH-CPP   Probiotic Product (PROBIOTIC PO) Take 1 capsule by mouth daily.   Yes [provider]  traMADol (ULTRAM) 50 MG tablet Take 50 mg by mouth 2 (two) times daily as needed for moderate pain (pain score 4-6) or severe pain (pain score 7-10). 10/22/23  Yes [provider]  triamterene-hydrochlorothiazide (DYAZIDE) 37.5-25 MG capsule Take 1 capsule by mouth every morning.   Yes [provider]  ALPRAZolam (XANAX) 0.25 MG tablet Take 0.25 mg by mouth daily. Patient not taking: Reported on 10/24/2023 10/22/23   [provider]  cefTRIAXone (ROCEPHIN) 2 g injection Inject 2 g into the muscle daily. For 5 days starting 10/05/23 Patient not taking: Reported on 10/24/2023    [provider]  HYDROcodone-acetaminophen (NORCO/VICODIN) 5-325 MG tablet Take 1 tablet by mouth every 12 (twelve) hours. Patient not taking: Reported on 10/24/2023 10/23/23   [provider]  predniSONE (STERAPRED UNI-PAK 21 TAB) 10 MG (21) TBPK tablet Take 10 mg by mouth as directed. Patient not taking: Reported on 10/24/2023 10/22/23   [provider]  timolol (TIMOPTIC) 0.5 % ophthalmic solution Place 1 drop into both eyes 2 (two) times daily. Patient not taking: Reported on 10/24/2023 07/08/23   [provider]  traZODone (DESYREL) 50 MG tablet Take 1-2 tablets by mouth at bedtime. Patient not taking: Reported on 10/24/2023 04/23/23   [provider]   CT EXTREMITY LOWER LEFT WO CONTRAST Result Date: 10/24/2023 CLINICAL DATA:  Recent drain placement and abdomen. Scan due to pain, swelling, and subcutaneous free air. Evaluate for necrotizing fasciitis. EXAM: CT OF THE LOWER LEFT EXTREMITY WITHOUT CONTRAST TECHNIQUE: Multidetector CT imaging of the lower left extremity was performed according to the standard protocol. RADIATION DOSE REDUCTION: This exam was performed according to the departmental dose-optimization program which includes automated exposure control,  adjustment of the mA and/or kV according to patient size and/or use of iterative reconstruction technique. COMPARISON:  CT chest, abdomen, and pelvis 10/24/2023 FINDINGS: Bones/Joint/Cartilage No cortical erosion is seen.  No acute fracture. There are individual screws within the distal aspect of the first and fifth metatarsals. Ligaments Suboptimally assessed by CT. Muscles and Tendons There is again air seen tracking along the sartorius and the left groin musculature as on CT of the chest, abdomen, and pelvis earlier today. There is extension of this air within the fascial planes surrounding these muscles throughout the more distal thigh, and air is seen around fascial planes within the sartorius, anterior left quadriceps, obturator externus, adductor longus, brevis, and magnus. Within the mid to distal thigh air surrounds the hamstring fascial planes diffusely. Within the calf, air surrounds the fascial planes of the medial greater than lateral heads of the gastrocnemius muscles to the level of the ankle. Findings are concerning for  necrotizing fasciitis diffusely throughout the left lower leg. Within the thigh, although the anterior superficial portion of the quadriceps musculature appears affected, and the deep fascial planes of the quadriceps musculature do not demonstrate air. Within the calf, the anterior compartment and deep posterior compartment appear less affected. Soft tissues There is moderate diffuse subcutaneous fat edema and swelling throughout the left thigh and calf. There is density likely representing confluent edema lateral to the knee (axial series 6, image 319 and coronal series 9, image 118). No walled-off abscess is seen. There is a new anterior left pelvic approach drainage catheter terminating within the region of the complex collection containing air and fluid within left hemipelvis on CT chest, abdomen, pelvis earlier today. That suspected abscess adjacent to the wall thickening within  the sigmoid colon is markedly decreased from prior. A Foley catheter is seen within the urinary bladder which contains contrast. IMPRESSION: 1. There is extensive air tracking along the fascial planes of the left hip, groin, thigh, and posterior calf musculature concerning for diffuse necrotizing fasciitis extending down to the level of the ankle. 2. There is moderate diffuse subcutaneous fat edema and swelling throughout the left thigh and calf. There is density likely representing confluent edema lateral to the knee. No walled-off abscess is seen. 3. There is a new anterior left pelvic approach drainage catheter terminating within the region of the complex collection containing air and fluid within left hemipelvis on CT chest, abdomen, pelvis earlier today. That suspected abscess adjacent to the wall thickening within the sigmoid colon is markedly decreased from prior. Critical Value/emergent results were called by telephone at the time of interpretation on 10/24/2023 at 6:12 pm to provider Carma Leaven , who verbally acknowledged these results. Electronically Signed   By: Neita Garnet M.D.   On: 10/24/2023 18:15   CT Angio Chest/Abd/Pel for Dissection W and/or Wo Contrast Result Date: 10/24/2023 CLINICAL DATA:  Septic arterial embolism. Left leg pain and swelling. History of liver abscess drained on 08/20/2024. EXAM: CT ANGIOGRAPHY CHEST, ABDOMEN AND PELVIS TECHNIQUE: Non-contrast CT of the chest was initially obtained. Multidetector CT imaging through the chest, abdomen and pelvis was performed using the standard protocol during bolus administration of intravenous contrast. Multiplanar reconstructed images and MIPs were obtained and reviewed to evaluate the vascular anatomy. RADIATION DOSE REDUCTION: This exam was performed according to the departmental dose-optimization program which includes automated exposure control, adjustment of the mA and/or kV according to patient size and/or use of iterative  reconstruction technique. CONTRAST:  OMNIPAQUE IOHEXOL 350 MG/ML SOLN COMPARISON:  CT abdomen 10/21/2023. CT abdomen and pelvis 09/14/2023. FINDINGS: CTA CHEST FINDINGS Cardiovascular: Preferential opacification of the thoracic aorta. No evidence of thoracic aortic aneurysm or dissection. Normal heart size. No pericardial effusion. There is calcified atherosclerotic disease throughout the aorta. Bovine arch anatomy is noted. Mediastinum/Nodes: No enlarged mediastinal, hilar, or axillary lymph nodes. Thyroid gland, trachea, and esophagus demonstrate no significant findings. Lungs/Pleura: Are minimal atelectatic changes in the bilateral lower lobes and lingula. There is no pleural effusion or pneumothorax. Musculoskeletal: No chest wall abnormality. No acute or significant osseous findings. Review of the MIP images confirms the above findings. CTA ABDOMEN AND PELVIS FINDINGS VASCULAR Aorta: Normal caliber aorta without aneurysm, dissection, vasculitis or significant stenosis. There is calcified atherosclerotic disease throughout the aorta. 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 Otherwise, the celiac artery and its branches appear within normal limits. SMA: Patent without evidence of  aneurysm, dissection, vasculitis or significant stenosis. Renals: There is mild-to-moderate focal stenosis of the origin of the left renal artery secondary to atherosclerotic disease. Renal arteries are otherwise within normal limits. IMA: Patent without evidence of aneurysm, dissection, vasculitis or significant stenosis. Inflow: Patent without evidence of aneurysm, dissection, vasculitis or significant stenosis. Calcified atherosclerosis is present. Other: There is moderate focal stenosis in the proximal left superficial femoral artery. There severe focal stenosis and possible focal thrombosis in the proximal right profunda femoral artery. Veins: No obvious venous  abnormality within the limitations of this arterial phase study. Review of the MIP images confirms the above findings. NON-VASCULAR Hepatobiliary: Percutaneous drainage catheter has been removed. Ill-defined heterogeneous hypodensity is again seen in the central liver measuring proximally 5.9 x 3.8 cm similar to the prior examination. Rounded hypodensity in the dome of the liver is unchanged and favored as a cyst. Gallbladder is surgically absent. There stable common bile duct dilatation. Pancreas: Unremarkable. No pancreatic ductal dilatation or surrounding inflammatory changes. Spleen: Normal in size without focal abnormality. Adrenals/Urinary Tract: There some bilateral adrenal thickening which is new from prior. There is no hydronephrosis or perinephric stranding. There is a cyst in the superior pole the right kidney measuring 1.7 cm. Bladder is within normal limits. Stomach/Bowel: There is no evidence for bowel obstruction, pneumatosis or free air. There is diffuse colonic diverticulosis. There is a large amount of stool throughout the colon. The appendix is not seen. There is wall thickening of the mid sigmoid colon without surrounding inflammatory stranding. There is a new collection containing presumed fecal matter and air in the left lower quadrant measuring 6.8 x 6.2 by 6.9 cm. This is inferior to and abutting the sigmoid colon without surrounding inflammation. Small bowel and stomach are within normal limits. Lymphatic: No enlarged lymph nodes are identified. Reproductive: Small calcified fibroids are present. Adnexa are within normal limits. Other: No abdominal wall hernia or abnormality. No abdominopelvic ascites. Musculoskeletal: No acute osseous abnormality. There is soft tissue gas and edema within the abductor thigh musculature and anterior thigh musculature. Review of the MIP images confirms the above findings. IMPRESSION: VASCULAR 1. No evidence for aortic dissection or aneurysm. 2. 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 3. Moderate focal stenosis in the proximal left superficial femoral artery. 4. Severe focal stenosis and possible focal thrombosis in the proximal right profunda femoral artery. Aortic Atherosclerosis (ICD10-I70.0). NON_VASCULAR 1. New 6.9 cm collection containing presumed fecal matter and air in the left lower quadrant abutting the sigmoid colon. Findings may represent a contained perforation of diverticulum with abscess formation. Please correlate clinically. 2. Wall thickening of the mid sigmoid colon without surrounding inflammatory stranding. Findings may be related to colitis/diverticulitis. Underlying lesion can not be excluded. 3. Large amount of stool throughout the colon. 4. Soft tissue gas and edema within the left abductor thigh musculature and anterior thigh musculature worrisome for infection/myositis/fasciitis including necrotizing fasciitis. 5. Stable ill-defined hypodensity in the central liver favored as abscess. 6. New bilateral adrenal thickening. These results were called by telephone at the time of interpretation on 10/24/2023 at 1:38 am to provider [facility] , who verbally acknowledged these results. Electronically Signed   By: Darliss Cheney M.D.   On: 10/24/2023 01:41   DG Chest Port 1 View Result Date: 10/23/2023 CLINICAL DATA:  Questionable sepsis EXAM: PORTABLE CHEST 1 VIEW COMPARISON:  Chest x-ray 09/14/2023 FINDINGS: The heart size and mediastinal contours are within normal limits. Both lungs are clear. The visualized  skeletal structures are unremarkable. IMPRESSION: No active disease. Electronically Signed   By: Darliss Cheney M.D.   On: 10/23/2023 23:49    Positive ROS: All other systems have been reviewed and were otherwise negative with the exception of those mentioned in the HPI and as above.  Physical Exam: General: Obtunded, answers some questions, in distress Cardiovascular: No pedal edema Respiratory: No cyanosis, no use  of accessory musculature GI: Left lower quadrant drain in place Skin: No lesions in the area of chief complaint Neurologic: Sensation intact distally Psychiatric: Patient is competent for consent with normal mood and affect Lymphatic: No axillary or cervical lymphadenopathy  MUSCULOSKELETAL: Examination of the left lower extremity reveals generalized swelling, worse in the thigh.  No open skin wounds.  There is warmth, erythema involving the medial thigh.  There is severe tenderness to palpation over the anterior and medial thigh as well as the medial calf.  Crepitation is present.  She has limited active range of motion of the foot, presumably due to pain.  Due to her lower extremity swelling, I am unable to palpate pulses, however she has triphasic Doppler signals DP and PT.  She reports subjective sensory change throughout the lower extremity.  Assessment: Extensive necrotizing fasciitis left lower extremity Left lower quadrant abdominal abscess status post percutaneous drain placement  Plan: Patient was examined in the ICU.  Dr. Natale Milch with [facility] was present.  Physical exam and imaging diagnostic for necrotizing fasciitis of the left lower extremity.  There is significant threat to her life and limb. Recommend emergent operative debridement as a life saving measure.  Unfortunately, the mortality rate from this problem is very high.  She will need regular trips to the OR for repeat I&D and vac change.  Plan for postop transfer to [facility] for ongoing care with Dr. Lajoyce Corners when able.   I also discussed the patient with Dr. Luisa Hart, general surgery. I discussed all of this with the patient's husband, Mellody Dance.   Jonette Pesa, MD [phone]    10/24/2023 8:04 PM

## 2023-10-24 NOTE — Consult Note (Signed)
CC: L leg pain  HPI: Ashley Galloway is an 73 y.o. female who is here for increasing pain in her L leg.  She states that she has had L thigh swelling for ~2 weeks.  It has increasingly gotten worse.  She denies any abd pain or change in bowel habits.  She has a recent h/o liver abscess and perc drainage ~1 month ago.    Past Medical History:  Diagnosis Date   Centrilobular emphysema (HCC)    Colon polyps    Looks like saw Dr. Marca Ancona in 2021   Diverticulitis    HLD (hyperlipidemia)    TIA (transient ischemic attack)    Vesicointestinal fistula     Past Surgical History:  Procedure Laterality Date   CHOLECYSTECTOMY     IR GUIDED DRAIN W CATHETER PLACEMENT  09/16/2023   PARTIAL COLECTOMY     Tubulovillous adenoma   SPINE SURGERY      Family History  Problem Relation Age of Onset   Breast cancer Neg Hx     Social:  reports that she has been smoking cigarettes. She has been exposed to tobacco smoke. She has never used smokeless tobacco. No history on file for alcohol use and drug use.  Allergies:  Allergies  Allergen Reactions   Tape     Tape from hormone patch.    Medications: I have reviewed the patient's current medications.  Results for orders placed or performed during the hospital encounter of 10/23/23 (from the past 48 hours)  Resp panel by RT-PCR (RSV, Flu A&B, Covid) Anterior Nasal Swab     Status: None   Collection Time: 10/23/23 11:37 PM   Specimen: Anterior Nasal Swab  Result Value Ref Range   SARS Coronavirus 2 by RT PCR NEGATIVE NEGATIVE    Comment: (NOTE) SARS-CoV-2 target nucleic acids are NOT DETECTED.  The SARS-CoV-2 RNA is generally detectable in upper respiratory specimens during the acute phase of infection. The lowest concentration of SARS-CoV-2 viral copies this assay can detect is 138 copies/mL. A negative result does not preclude SARS-Cov-2 infection and should not be used as the sole basis for treatment or other patient  management decisions. A negative result may occur with  improper specimen collection/handling, submission of specimen other than nasopharyngeal swab, presence of viral mutation(s) within the areas targeted by this assay, and inadequate number of viral copies(<138 copies/mL). A negative result must be combined with clinical observations, patient history, and epidemiological information. The expected result is Negative.  Fact Sheet for Patients:  BloggerCourse.com  Fact Sheet for Healthcare Providers:  SeriousBroker.it  This test is no t yet approved or cleared by the [place] FDA and  has been authorized for detection and/or diagnosis of SARS-CoV-2 by FDA under an Emergency Use Authorization (EUA). This EUA will remain  in effect (meaning this test can be used) for the duration of the COVID-19 declaration under Section 564(b)(1) of the Act, 21 U.S.C.section 360bbb-3(b)(1), unless the authorization is terminated  or revoked sooner.       Influenza A by PCR NEGATIVE NEGATIVE   Influenza B by PCR NEGATIVE NEGATIVE    Comment: (NOTE) The Xpert Xpress SARS-CoV-2/FLU/RSV plus assay is intended as an aid in the diagnosis of influenza from Nasopharyngeal swab specimens and should not be used as a sole basis for treatment. Nasal washings and aspirates are unacceptable for Xpert Xpress SARS-CoV-2/FLU/RSV testing.  Fact Sheet for Patients: BloggerCourse.com  Fact Sheet for Healthcare Providers: SeriousBroker.it  This test is not  yet approved or cleared by the [place] and has been authorized for detection and/or diagnosis of SARS-CoV-2 by FDA under an Emergency Use Authorization (EUA). This EUA will remain in effect (meaning this test can be used) for the duration of the COVID-19 declaration under Section 564(b)(1) of the Act, 21 U.S.C. section 360bbb-3(b)(1), unless the  authorization is terminated or revoked.     Resp Syncytial Virus by PCR NEGATIVE NEGATIVE    Comment: (NOTE) Fact Sheet for Patients: BloggerCourse.com  Fact Sheet for Healthcare Providers: SeriousBroker.it  This test is not yet approved or cleared by the [place] FDA and has been authorized for detection and/or diagnosis of SARS-CoV-2 by FDA under an Emergency Use Authorization (EUA). This EUA will remain in effect (meaning this test can be used) for the duration of the COVID-19 declaration under Section 564(b)(1) of the Act, 21 U.S.C. section 360bbb-3(b)(1), unless the authorization is terminated or revoked.  Performed at [facility], [address], [place], [place] [address]   Comprehensive metabolic panel     Status: Abnormal   Collection Time: 10/23/23 11:37 PM  Result Value Ref Range   Sodium 131 (L) 135 - 145 mmol/L   Potassium 5.2 (H) 3.5 - 5.1 mmol/L   Chloride 96 (L) 98 - 111 mmol/L   CO2 19 (L) 22 - 32 mmol/L   Glucose, Bld 181 (H) 70 - 99 mg/dL    Comment: Glucose reference range applies only to samples taken after fasting for at least 8 hours.   BUN 59 (H) 8 - 23 mg/dL   Creatinine, Ser 1.91 (H) 0.44 - 1.00 mg/dL   Calcium 9.6 8.9 - 47.8 mg/dL   Total Protein 8.0 6.5 - 8.1 g/dL   Albumin 2.2 (L) 3.5 - 5.0 g/dL   AST 64 (H) 15 - 41 U/L   ALT 36 0 - 44 U/L   Alkaline Phosphatase 119 38 - 126 U/L   Total Bilirubin 1.3 (H) 0.0 - 1.2 mg/dL   GFR, Estimated 42 (L) >60 mL/min    Comment: (NOTE) Calculated using the CKD-EPI Creatinine Equation (2021)    Anion gap 16 (H) 5 - 15    Comment: Performed at [facility], [address] [place], [place] [address]  CBC with Differential     Status: Abnormal   Collection Time: 10/23/23 11:37 PM  Result Value Ref Range   WBC 17.7 (H) 4.0 - 10.5 K/uL   RBC 5.31 (H) 3.87 - 5.11 MIL/uL   Hemoglobin 14.8 12.0 - 15.0 g/dL   HCT  13.0 86.5 - 78.4 %   MCV 86.4 80.0 - 100.0 fL   MCH 27.9 26.0 - 34.0 pg   MCHC 32.2 30.0 - 36.0 g/dL   RDW 69.6 29.5 - 28.4 %   Platelets 606 (H) 150 - 400 K/uL   nRBC 0.0 0.0 - 0.2 %   Neutrophils Relative % 60 %   Lymphocytes Relative 10 %   Monocytes Relative 3 %   Eosinophils Relative 0 %   Basophils Relative 0 %   Band Neutrophils 22 %   Immature Granulocytes 0 %   Metamyelocytes Relative 3 %   Myelocytes 2 %   Promyelocytes Relative 0 %   Blasts 0 %   nRBC 0 0 /100 WBC   Other 0 %   Neutro Abs 14.5 (H) 1.7 - 7.7 K/uL   Lymphs Abs 1.8 0.7 - 4.0 K/uL   Monocytes Absolute 0.5 0.1 - 1.0  K/uL   Eosinophils Absolute 0.0 0.0 - 0.5 K/uL   Basophils Absolute 0.0 0.0 - 0.1 K/uL   Abs Immature Granulocytes 0.89 (H) 0.00 - 0.07 K/uL   WBC Morphology INCREASED BANDS (>20% BANDS)     Comment: Performed at [facility], [address] [place], [place] [address]  Protime-INR     Status: Abnormal   Collection Time: 10/23/23 11:37 PM  Result Value Ref Range   Prothrombin Time 18.9 (H) 11.4 - 15.2 seconds   INR 1.6 (H) 0.8 - 1.2    Comment: (NOTE) INR goal varies based on device and disease states. Performed at [facility], [address] [place], [place] [address]   APTT     Status: None   Collection Time: 10/23/23 11:37 PM  Result Value Ref Range   aPTT 24 24 - 36 seconds    Comment: Performed at [facility], [address] [place], [place] [address]  Troponin I (High Sensitivity)     Status: Abnormal   Collection Time: 10/23/23 11:37 PM  Result Value Ref Range   Troponin I (High Sensitivity) 27 (H) <18 ng/L    Comment: (NOTE) Elevated high sensitivity troponin I (hsTnI) values and significant  changes across serial measurements may suggest ACS but many other  chronic and acute conditions are known to elevate hsTnI results.  Refer to the "Links" section for chest pain algorithms and additional  guidance. Performed  at [facility], [address] [place], [place] [address]   I-Stat Lactic Acid, ED     Status: Abnormal   Collection Time: 10/23/23 11:47 PM  Result Value Ref Range   Lactic Acid, Venous 6.0 (HH) 0.5 - 1.9 mmol/L  Urinalysis, w/ Reflex to Culture (Infection Suspected) -Urine, Clean Catch     Status: Abnormal   Collection Time: 10/24/23 12:41 AM  Result Value Ref Range   Specimen Source URINE, CLEAN CATCH    Color, Urine YELLOW YELLOW   APPearance HAZY (A) CLEAR   Specific Gravity, Urine 1.026 1.005 - 1.030   pH 5.0 5.0 - 8.0   Glucose, UA NEGATIVE NEGATIVE mg/dL   Hgb urine dipstick NEGATIVE NEGATIVE   Bilirubin Urine NEGATIVE NEGATIVE   Ketones, ur NEGATIVE NEGATIVE mg/dL   Protein, ur 30 (A) NEGATIVE mg/dL   Nitrite NEGATIVE NEGATIVE   Leukocytes,Ua NEGATIVE NEGATIVE   RBC / HPF 0-5 0 - 5 RBC/hpf   WBC, UA 0-5 0 - 5 WBC/hpf    Comment:        Reflex urine culture not performed if WBC <=10, OR if Squamous epithelial cells >5. If Squamous epithelial cells >5 suggest recollection.    Bacteria, UA NONE SEEN NONE SEEN   Squamous Epithelial / HPF 0-5 0 - 5 /HPF    Comment: Performed at [facility], [address], [place], [place] [address]  Troponin I (High Sensitivity)     Status: Abnormal   Collection Time: 10/24/23  1:45 AM  Result Value Ref Range   Troponin I (High Sensitivity) 21 (H) <18 ng/L    Comment: (NOTE) Elevated high sensitivity troponin I (hsTnI) values and significant  changes across serial measurements may suggest ACS but many other  chronic and acute conditions are known to elevate hsTnI results.  Refer to the "Links" section for chest pain algorithms and additional  guidance. Performed at [facility], [address] [place], [place] [address]   I-Stat Lactic Acid, ED  Status: Abnormal   Collection Time: 10/24/23  1:58 AM  Result Value Ref Range   Lactic Acid, Venous 6.7 (HH) 0.5 - 1.9  mmol/L   Comment NOTIFIED PHYSICIAN   CBG monitoring, ED     Status: Abnormal   Collection Time: 10/24/23  4:09 AM  Result Value Ref Range   Glucose-Capillary 118 (H) 70 - 99 mg/dL    Comment: Glucose reference range applies only to samples taken after fasting for at least 8 hours.   Comment 1 Notify RN   Hemoglobin A1c     Status: Abnormal   Collection Time: 10/24/23  4:10 AM  Result Value Ref Range   Hgb A1c MFr Bld 6.3 (H) 4.8 - 5.6 %    Comment: (NOTE) Pre diabetes:          5.7%-6.4%  Diabetes:              >6.4%  Glycemic control for   <7.0% adults with diabetes    Mean Plasma Glucose 134.11 mg/dL    Comment: Performed at [facility], [address] [place], [place] [address]  Basic metabolic panel     Status: Abnormal   Collection Time: 10/24/23  4:10 AM  Result Value Ref Range   Sodium 130 (L) 135 - 145 mmol/L   Potassium 4.2 3.5 - 5.1 mmol/L   Chloride 98 98 - 111 mmol/L   CO2 20 (L) 22 - 32 mmol/L   Glucose, Bld 140 (H) 70 - 99 mg/dL    Comment: Glucose reference range applies only to samples taken after fasting for at least 8 hours.   BUN 51 (H) 8 - 23 mg/dL   Creatinine, Ser 1.91 0.44 - 1.00 mg/dL   Calcium 8.4 (L) 8.9 - 10.3 mg/dL   GFR, Estimated >47 >82 mL/min    Comment: (NOTE) Calculated using the CKD-EPI Creatinine Equation (2021)    Anion gap 12 5 - 15    Comment: Performed at [facility], [address] [place], [place] [address]  Hepatic function panel     Status: Abnormal   Collection Time: 10/24/23  4:10 AM  Result Value Ref Range   Total Protein 5.8 (L) 6.5 - 8.1 g/dL   Albumin 1.6 (L) 3.5 - 5.0 g/dL   AST 51 (H) 15 - 41 U/L   ALT 30 0 - 44 U/L   Alkaline Phosphatase 86 38 - 126 U/L   Total Bilirubin 0.7 0.0 - 1.2 mg/dL   Bilirubin, Direct 0.3 (H) 0.0 - 0.2 mg/dL   Indirect Bilirubin 0.4 0.3 - 0.9 mg/dL    Comment: Performed at [facility], [address], [place], [place] [address]  CBC      Status: Abnormal   Collection Time: 10/24/23  4:10 AM  Result Value Ref Range   WBC 11.2 (H) 4.0 - 10.5 K/uL   RBC 4.28 3.87 - 5.11 MIL/uL   Hemoglobin 12.1 12.0 - 15.0 g/dL   HCT 78.4 69.6 - 29.5 %   MCV 87.4 80.0 - 100.0 fL   MCH 28.3 26.0 - 34.0 pg   MCHC 32.4 30.0 - 36.0 g/dL   RDW 28.4 13.2 - 44.0 %   Platelets 456 (H) 150 - 400 K/uL   nRBC 0.0 0.0 - 0.2 %    Comment: Performed at [facility], [address] [place], [place] [address]  Lactic acid, plasma     Status: Abnormal   Collection Time: 10/24/23  4:10 AM  Result Value Ref Range   Lactic Acid, Venous 5.9 (HH) 0.5 - 1.9 mmol/L    Comment: CRITICAL RESULT CALLED TO, READ BACK BY AND VERIFIED WITH ESTRADA G. @ 7829 10/24/23 MCLEAN K. Performed at [facility], [address], [place], [place] [address]     CT Angio Chest/Abd/Pel for Dissection W and/or Wo Contrast Result Date: 10/24/2023 CLINICAL DATA:  Septic arterial embolism. Left leg pain and swelling. History of liver abscess drained on 08/20/2024. EXAM: CT ANGIOGRAPHY CHEST, ABDOMEN AND PELVIS TECHNIQUE: Non-contrast CT of the chest was initially obtained. Multidetector CT imaging through the chest, abdomen and pelvis was performed using the standard protocol during bolus administration of intravenous contrast. Multiplanar reconstructed images and MIPs were obtained and reviewed to evaluate the vascular anatomy. RADIATION DOSE REDUCTION: This exam was performed according to the departmental dose-optimization program which includes automated exposure control, adjustment of the mA and/or kV according to patient size and/or use of iterative reconstruction technique. CONTRAST:  OMNIPAQUE IOHEXOL 350 MG/ML SOLN COMPARISON:  CT abdomen 10/21/2023. CT abdomen and pelvis 09/14/2023. FINDINGS: CTA CHEST FINDINGS Cardiovascular: Preferential opacification of the thoracic aorta. No evidence of thoracic aortic aneurysm or dissection. Normal  heart size. No pericardial effusion. There is calcified atherosclerotic disease throughout the aorta. Bovine arch anatomy is noted. Mediastinum/Nodes: No enlarged mediastinal, hilar, or axillary lymph nodes. Thyroid gland, trachea, and esophagus demonstrate no significant findings. Lungs/Pleura: Are minimal atelectatic changes in the bilateral lower lobes and lingula. There is no pleural effusion or pneumothorax. Musculoskeletal: No chest wall abnormality. No acute or significant osseous findings. Review of the MIP images confirms the above findings. CTA ABDOMEN AND PELVIS FINDINGS VASCULAR Aorta: Normal caliber aorta without aneurysm, dissection, vasculitis or significant stenosis. There is calcified atherosclerotic disease throughout the aorta. 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 Otherwise, the celiac artery and its branches appear within normal limits. SMA: Patent without evidence of aneurysm, dissection, vasculitis or significant stenosis. Renals: There is mild-to-moderate focal stenosis of the origin of the left renal artery secondary to atherosclerotic disease. Renal arteries are otherwise within normal limits. IMA: Patent without evidence of aneurysm, dissection, vasculitis or significant stenosis. Inflow: Patent without evidence of aneurysm, dissection, vasculitis or significant stenosis. Calcified atherosclerosis is present. Other: There is moderate focal stenosis in the proximal left superficial femoral artery. There severe focal stenosis and possible focal thrombosis in the proximal right profunda femoral artery. Veins: No obvious venous abnormality within the limitations of this arterial phase study. Review of the MIP images confirms the above findings. NON-VASCULAR Hepatobiliary: Percutaneous drainage catheter has been removed. Ill-defined heterogeneous hypodensity is again seen in the central liver measuring proximally 5.9 x 3.8 cm  similar to the prior examination. Rounded hypodensity in the dome of the liver is unchanged and favored as a cyst. Gallbladder is surgically absent. There stable common bile duct dilatation. Pancreas: Unremarkable. No pancreatic ductal dilatation or surrounding inflammatory changes. Spleen: Normal in size without focal abnormality. Adrenals/Urinary Tract: There some bilateral adrenal thickening which is new from prior. There is no hydronephrosis or perinephric stranding. There is a cyst in the superior pole the right kidney measuring 1.7 cm. Bladder is within normal limits. Stomach/Bowel: There is no evidence for bowel obstruction, pneumatosis or free air. There is diffuse colonic diverticulosis. There is a large amount of stool throughout the colon. The appendix is not seen. There is wall thickening of the mid sigmoid  colon without surrounding inflammatory stranding. There is a new collection containing presumed fecal matter and air in the left lower quadrant measuring 6.8 x 6.2 by 6.9 cm. This is inferior to and abutting the sigmoid colon without surrounding inflammation. Small bowel and stomach are within normal limits. Lymphatic: No enlarged lymph nodes are identified. Reproductive: Small calcified fibroids are present. Adnexa are within normal limits. Other: No abdominal wall hernia or abnormality. No abdominopelvic ascites. Musculoskeletal: No acute osseous abnormality. There is soft tissue gas and edema within the abductor thigh musculature and anterior thigh musculature. Review of the MIP images confirms the above findings. IMPRESSION: VASCULAR 1. No evidence for aortic dissection or aneurysm. 2. 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 3. Moderate focal stenosis in the proximal left superficial femoral artery. 4. Severe focal stenosis and possible focal thrombosis in the proximal right profunda femoral artery. Aortic Atherosclerosis (ICD10-I70.0). NON_VASCULAR 1. New 6.9 cm collection  containing presumed fecal matter and air in the left lower quadrant abutting the sigmoid colon. Findings may represent a contained perforation of diverticulum with abscess formation. Please correlate clinically. 2. Wall thickening of the mid sigmoid colon without surrounding inflammatory stranding. Findings may be related to colitis/diverticulitis. Underlying lesion can not be excluded. 3. Large amount of stool throughout the colon. 4. Soft tissue gas and edema within the left abductor thigh musculature and anterior thigh musculature worrisome for infection/myositis/fasciitis including necrotizing fasciitis. 5. Stable ill-defined hypodensity in the central liver favored as abscess. 6. New bilateral adrenal thickening. These results were called by telephone at the time of interpretation on 10/24/2023 at 1:38 am to provider [facility] , who verbally acknowledged these results. Electronically Signed   By: Darliss Cheney M.D.   On: 10/24/2023 01:41   DG Chest Port 1 View Result Date: 10/23/2023 CLINICAL DATA:  Questionable sepsis EXAM: PORTABLE CHEST 1 VIEW COMPARISON:  Chest x-ray 09/14/2023 FINDINGS: The heart size and mediastinal contours are within normal limits. Both lungs are clear. The visualized skeletal structures are unremarkable. IMPRESSION: No active disease. Electronically Signed   By: Darliss Cheney M.D.   On: 10/23/2023 23:49    ROS - all of the below systems have been reviewed with the patient and positives are indicated with bold text General: chills, fever or night sweats Eyes: blurry vision or double vision ENT: epistaxis or sore throat Allergy/Immunology: itchy/watery eyes or nasal congestion Hematologic/Lymphatic: bleeding problems, blood clots or swollen lymph nodes Endocrine: temperature intolerance or unexpected weight changes Breast: new or changing breast lumps or nipple discharge Resp: cough, shortness of breath, or wheezing CV: chest pain or dyspnea on exertion GI: as per  HPI GU: dysuria, trouble voiding, or hematuria MSK: joint pain or joint stiffness Neuro: TIA or stroke symptoms Derm: pruritus and skin lesion changes Psych: anxiety and depression  PE Blood pressure (!) 108/49, pulse 91, temperature 100.3 F (37.9 C), resp. rate (!) 23, weight 71.3 kg, SpO2 100%. Constitutional: NAD; conversant; no deformities Eyes: Moist conjunctiva; no lid lag; anicteric; PERRL Neck: Trachea midline; no thyromegaly Lungs: Normal respiratory effort; no tactile fremitus CV: RRR; no palpable thrills; no pitting edema GI: Abd soft, mild TTP LLQ; no palpable hepatosplenomegaly MSK: L thigh edema, no external lesions.  +pedal pulse, no erythema Psychiatric: Appropriate affect; alert and oriented x3 Lymphatic: No palpable cervical or axillary lymphadenopathy  Results for orders placed or performed during the hospital encounter of 10/23/23 (from the past 48 hours)  Resp panel by RT-PCR (RSV, Flu A&B, Covid) Anterior  Nasal Swab     Status: None   Collection Time: 10/23/23 11:37 PM   Specimen: Anterior Nasal Swab  Result Value Ref Range   SARS Coronavirus 2 by RT PCR NEGATIVE NEGATIVE    Comment: (NOTE) SARS-CoV-2 target nucleic acids are NOT DETECTED.  The SARS-CoV-2 RNA is generally detectable in upper respiratory specimens during the acute phase of infection. The lowest concentration of SARS-CoV-2 viral copies this assay can detect is 138 copies/mL. A negative result does not preclude SARS-Cov-2 infection and should not be used as the sole basis for treatment or other patient management decisions. A negative result may occur with  improper specimen collection/handling, submission of specimen other than nasopharyngeal swab, presence of viral mutation(s) within the areas targeted by this assay, and inadequate number of viral copies(<138 copies/mL). A negative result must be combined with clinical observations, patient history, and epidemiological information. The  expected result is Negative.  Fact Sheet for Patients:  BloggerCourse.com  Fact Sheet for Healthcare Providers:  SeriousBroker.it  This test is no t yet approved or cleared by the [place] FDA and  has been authorized for detection and/or diagnosis of SARS-CoV-2 by FDA under an Emergency Use Authorization (EUA). This EUA will remain  in effect (meaning this test can be used) for the duration of the COVID-19 declaration under Section 564(b)(1) of the Act, 21 U.S.C.section 360bbb-3(b)(1), unless the authorization is terminated  or revoked sooner.       Influenza A by PCR NEGATIVE NEGATIVE   Influenza B by PCR NEGATIVE NEGATIVE    Comment: (NOTE) The Xpert Xpress SARS-CoV-2/FLU/RSV plus assay is intended as an aid in the diagnosis of influenza from Nasopharyngeal swab specimens and should not be used as a sole basis for treatment. Nasal washings and aspirates are unacceptable for Xpert Xpress SARS-CoV-2/FLU/RSV testing.  Fact Sheet for Patients: BloggerCourse.com  Fact Sheet for Healthcare Providers: SeriousBroker.it  This test is not yet approved or cleared by the [place] FDA and has been authorized for detection and/or diagnosis of SARS-CoV-2 by FDA under an Emergency Use Authorization (EUA). This EUA will remain in effect (meaning this test can be used) for the duration of the COVID-19 declaration under Section 564(b)(1) of the Act, 21 U.S.C. section 360bbb-3(b)(1), unless the authorization is terminated or revoked.     Resp Syncytial Virus by PCR NEGATIVE NEGATIVE    Comment: (NOTE) Fact Sheet for Patients: BloggerCourse.com  Fact Sheet for Healthcare Providers: SeriousBroker.it  This test is not yet approved or cleared by the [place] FDA and has been authorized for detection and/or diagnosis of  SARS-CoV-2 by FDA under an Emergency Use Authorization (EUA). This EUA will remain in effect (meaning this test can be used) for the duration of the COVID-19 declaration under Section 564(b)(1) of the Act, 21 U.S.C. section 360bbb-3(b)(1), unless the authorization is terminated or revoked.  Performed at [facility], [address] [place], [place] [address]   Comprehensive metabolic panel     Status: Abnormal   Collection Time: 10/23/23 11:37 PM  Result Value Ref Range   Sodium 131 (L) 135 - 145 mmol/L   Potassium 5.2 (H) 3.5 - 5.1 mmol/L   Chloride 96 (L) 98 - 111 mmol/L   CO2 19 (L) 22 - 32 mmol/L   Glucose, Bld 181 (H) 70 - 99 mg/dL    Comment: Glucose reference range applies only to samples taken after fasting for at least 8 hours.   BUN 59 (H) 8 - 23  mg/dL   Creatinine, Ser 8.29 (H) 0.44 - 1.00 mg/dL   Calcium 9.6 8.9 - 56.2 mg/dL   Total Protein 8.0 6.5 - 8.1 g/dL   Albumin 2.2 (L) 3.5 - 5.0 g/dL   AST 64 (H) 15 - 41 U/L   ALT 36 0 - 44 U/L   Alkaline Phosphatase 119 38 - 126 U/L   Total Bilirubin 1.3 (H) 0.0 - 1.2 mg/dL   GFR, Estimated 42 (L) >60 mL/min    Comment: (NOTE) Calculated using the CKD-EPI Creatinine Equation (2021)    Anion gap 16 (H) 5 - 15    Comment: Performed at [facility], [address], [place], [place] [address]  CBC with Differential     Status: Abnormal   Collection Time: 10/23/23 11:37 PM  Result Value Ref Range   WBC 17.7 (H) 4.0 - 10.5 K/uL   RBC 5.31 (H) 3.87 - 5.11 MIL/uL   Hemoglobin 14.8 12.0 - 15.0 g/dL   HCT 57.8 46.9 - 62.9 %   MCV 86.4 80.0 - 100.0 fL   MCH 27.9 26.0 - 34.0 pg   MCHC 32.2 30.0 - 36.0 g/dL   RDW 52.8 41.3 - 24.4 %   Platelets 606 (H) 150 - 400 K/uL   nRBC 0.0 0.0 - 0.2 %   Neutrophils Relative % 60 %   Lymphocytes Relative 10 %   Monocytes Relative 3 %   Eosinophils Relative 0 %   Basophils Relative 0 %   Band Neutrophils 22 %   Immature Granulocytes 0 %    Metamyelocytes Relative 3 %   Myelocytes 2 %   Promyelocytes Relative 0 %   Blasts 0 %   nRBC 0 0 /100 WBC   Other 0 %   Neutro Abs 14.5 (H) 1.7 - 7.7 K/uL   Lymphs Abs 1.8 0.7 - 4.0 K/uL   Monocytes Absolute 0.5 0.1 - 1.0 K/uL   Eosinophils Absolute 0.0 0.0 - 0.5 K/uL   Basophils Absolute 0.0 0.0 - 0.1 K/uL   Abs Immature Granulocytes 0.89 (H) 0.00 - 0.07 K/uL   WBC Morphology INCREASED BANDS (>20% BANDS)     Comment: Performed at [facility], [address], [place], [place] [address]  Protime-INR     Status: Abnormal   Collection Time: 10/23/23 11:37 PM  Result Value Ref Range   Prothrombin Time 18.9 (H) 11.4 - 15.2 seconds   INR 1.6 (H) 0.8 - 1.2    Comment: (NOTE) INR goal varies based on device and disease states. Performed at [facility], [address], [place], [place] [address]   APTT     Status: None   Collection Time: 10/23/23 11:37 PM  Result Value Ref Range   aPTT 24 24 - 36 seconds    Comment: Performed at [facility], [address], [place], [place] [address]  Troponin I (High Sensitivity)     Status: Abnormal   Collection Time: 10/23/23 11:37 PM  Result Value Ref Range   Troponin I (High Sensitivity) 27 (H) <18 ng/L    Comment: (NOTE) Elevated high sensitivity troponin I (hsTnI) values and significant  changes across serial measurements may suggest ACS but many other  chronic and acute conditions are known to elevate hsTnI results.  Refer to the "Links" section for chest pain algorithms and additional  guidance. Performed at [facility], [address] [place], [place] [address]   I-Stat Lactic Acid, ED  Status: Abnormal   Collection Time: 10/23/23 11:47 PM  Result Value Ref Range   Lactic Acid, Venous 6.0 (HH) 0.5 - 1.9 mmol/L  Urinalysis, w/ Reflex to Culture (Infection Suspected) -Urine, Clean Catch     Status: Abnormal   Collection Time: 10/24/23 12:41 AM  Result  Value Ref Range   Specimen Source URINE, CLEAN CATCH    Color, Urine YELLOW YELLOW   APPearance HAZY (A) CLEAR   Specific Gravity, Urine 1.026 1.005 - 1.030   pH 5.0 5.0 - 8.0   Glucose, UA NEGATIVE NEGATIVE mg/dL   Hgb urine dipstick NEGATIVE NEGATIVE   Bilirubin Urine NEGATIVE NEGATIVE   Ketones, ur NEGATIVE NEGATIVE mg/dL   Protein, ur 30 (A) NEGATIVE mg/dL   Nitrite NEGATIVE NEGATIVE   Leukocytes,Ua NEGATIVE NEGATIVE   RBC / HPF 0-5 0 - 5 RBC/hpf   WBC, UA 0-5 0 - 5 WBC/hpf    Comment:        Reflex urine culture not performed if WBC <=10, OR if Squamous epithelial cells >5. If Squamous epithelial cells >5 suggest recollection.    Bacteria, UA NONE SEEN NONE SEEN   Squamous Epithelial / HPF 0-5 0 - 5 /HPF    Comment: Performed at [facility], [address] [place], [place] [address]  Troponin I (High Sensitivity)     Status: Abnormal   Collection Time: 10/24/23  1:45 AM  Result Value Ref Range   Troponin I (High Sensitivity) 21 (H) <18 ng/L    Comment: (NOTE) Elevated high sensitivity troponin I (hsTnI) values and significant  changes across serial measurements may suggest ACS but many other  chronic and acute conditions are known to elevate hsTnI results.  Refer to the "Links" section for chest pain algorithms and additional  guidance. Performed at [facility], [address] [place], [place] [address]   I-Stat Lactic Acid, ED     Status: Abnormal   Collection Time: 10/24/23  1:58 AM  Result Value Ref Range   Lactic Acid, Venous 6.7 (HH) 0.5 - 1.9 mmol/L   Comment NOTIFIED PHYSICIAN   CBG monitoring, ED     Status: Abnormal   Collection Time: 10/24/23  4:09 AM  Result Value Ref Range   Glucose-Capillary 118 (H) 70 - 99 mg/dL    Comment: Glucose reference range applies only to samples taken after fasting for at least 8 hours.   Comment 1 Notify RN   Hemoglobin A1c     Status: Abnormal   Collection Time: 10/24/23  4:10 AM   Result Value Ref Range   Hgb A1c MFr Bld 6.3 (H) 4.8 - 5.6 %    Comment: (NOTE) Pre diabetes:          5.7%-6.4%  Diabetes:              >6.4%  Glycemic control for   <7.0% adults with diabetes    Mean Plasma Glucose 134.11 mg/dL    Comment: Performed at [facility], [address], [place], [place] [address]  Basic metabolic panel     Status: Abnormal   Collection Time: 10/24/23  4:10 AM  Result Value Ref Range   Sodium 130 (L) 135 - 145 mmol/L   Potassium 4.2 3.5 - 5.1 mmol/L   Chloride 98 98 - 111 mmol/L   CO2 20 (L) 22 - 32 mmol/L   Glucose, Bld 140 (H) 70 - 99 mg/dL    Comment: Glucose reference range applies only  to samples taken after fasting for at least 8 hours.   BUN 51 (H) 8 - 23 mg/dL   Creatinine, Ser 1.61 0.44 - 1.00 mg/dL   Calcium 8.4 (L) 8.9 - 10.3 mg/dL   GFR, Estimated >09 >60 mL/min    Comment: (NOTE) Calculated using the CKD-EPI Creatinine Equation (2021)    Anion gap 12 5 - 15    Comment: Performed at [facility], [address], [place], [place] [address]  Hepatic function panel     Status: Abnormal   Collection Time: 10/24/23  4:10 AM  Result Value Ref Range   Total Protein 5.8 (L) 6.5 - 8.1 g/dL   Albumin 1.6 (L) 3.5 - 5.0 g/dL   AST 51 (H) 15 - 41 U/L   ALT 30 0 - 44 U/L   Alkaline Phosphatase 86 38 - 126 U/L   Total Bilirubin 0.7 0.0 - 1.2 mg/dL   Bilirubin, Direct 0.3 (H) 0.0 - 0.2 mg/dL   Indirect Bilirubin 0.4 0.3 - 0.9 mg/dL    Comment: Performed at [facility], [address], [place], [place] [address]  CBC     Status: Abnormal   Collection Time: 10/24/23  4:10 AM  Result Value Ref Range   WBC 11.2 (H) 4.0 - 10.5 K/uL   RBC 4.28 3.87 - 5.11 MIL/uL   Hemoglobin 12.1 12.0 - 15.0 g/dL   HCT 47.8 29.5 - 62.1 %   MCV 87.4 80.0 - 100.0 fL   MCH 28.3 26.0 - 34.0 pg   MCHC 32.4 30.0 - 36.0 g/dL   RDW 30.8 65.7 - 84.6 %   Platelets 456 (H) 150 - 400 K/uL   nRBC 0.0 0.0 - 0.2 %    Comment:  Performed at [facility], [address] [place], [place] [address]  Lactic acid, plasma     Status: Abnormal   Collection Time: 10/24/23  4:10 AM  Result Value Ref Range   Lactic Acid, Venous 5.9 (HH) 0.5 - 1.9 mmol/L    Comment: CRITICAL RESULT CALLED TO, READ BACK BY AND VERIFIED WITH ESTRADA G. @ 2841 10/24/23 MCLEAN K. Performed at [facility], [address] [place], [place] [address]     CT Angio Chest/Abd/Pel for Dissection W and/or Wo Contrast Result Date: 10/24/2023 CLINICAL DATA:  Septic arterial embolism. Left leg pain and swelling. History of liver abscess drained on 08/20/2024. EXAM: CT ANGIOGRAPHY CHEST, ABDOMEN AND PELVIS TECHNIQUE: Non-contrast CT of the chest was initially obtained. Multidetector CT imaging through the chest, abdomen and pelvis was performed using the standard protocol during bolus administration of intravenous contrast. Multiplanar reconstructed images and MIPs were obtained and reviewed to evaluate the vascular anatomy. RADIATION DOSE REDUCTION: This exam was performed according to the departmental dose-optimization program which includes automated exposure control, adjustment of the mA and/or kV according to patient size and/or use of iterative reconstruction technique. CONTRAST:  OMNIPAQUE IOHEXOL 350 MG/ML SOLN COMPARISON:  CT abdomen 10/21/2023. CT abdomen and pelvis 09/14/2023. FINDINGS: CTA CHEST FINDINGS Cardiovascular: Preferential opacification of the thoracic aorta. No evidence of thoracic aortic aneurysm or dissection. Normal heart size. No pericardial effusion. There is calcified atherosclerotic disease throughout the aorta. Bovine arch anatomy is noted. Mediastinum/Nodes: No enlarged mediastinal, hilar, or axillary lymph nodes. Thyroid gland, trachea, and esophagus demonstrate no significant findings. Lungs/Pleura: Are minimal atelectatic changes in the bilateral lower lobes and lingula. There is no pleural  effusion or pneumothorax. Musculoskeletal: No chest  wall abnormality. No acute or significant osseous findings. Review of the MIP images confirms the above findings. CTA ABDOMEN AND PELVIS FINDINGS VASCULAR Aorta: Normal caliber aorta without aneurysm, dissection, vasculitis or significant stenosis. There is calcified atherosclerotic disease throughout the aorta. 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 Otherwise, the celiac artery and its branches appear within normal limits. SMA: Patent without evidence of aneurysm, dissection, vasculitis or significant stenosis. Renals: There is mild-to-moderate focal stenosis of the origin of the left renal artery secondary to atherosclerotic disease. Renal arteries are otherwise within normal limits. IMA: Patent without evidence of aneurysm, dissection, vasculitis or significant stenosis. Inflow: Patent without evidence of aneurysm, dissection, vasculitis or significant stenosis. Calcified atherosclerosis is present. Other: There is moderate focal stenosis in the proximal left superficial femoral artery. There severe focal stenosis and possible focal thrombosis in the proximal right profunda femoral artery. Veins: No obvious venous abnormality within the limitations of this arterial phase study. Review of the MIP images confirms the above findings. NON-VASCULAR Hepatobiliary: Percutaneous drainage catheter has been removed. Ill-defined heterogeneous hypodensity is again seen in the central liver measuring proximally 5.9 x 3.8 cm similar to the prior examination. Rounded hypodensity in the dome of the liver is unchanged and favored as a cyst. Gallbladder is surgically absent. There stable common bile duct dilatation. Pancreas: Unremarkable. No pancreatic ductal dilatation or surrounding inflammatory changes. Spleen: Normal in size without focal abnormality. Adrenals/Urinary Tract: There some bilateral adrenal thickening  which is new from prior. There is no hydronephrosis or perinephric stranding. There is a cyst in the superior pole the right kidney measuring 1.7 cm. Bladder is within normal limits. Stomach/Bowel: There is no evidence for bowel obstruction, pneumatosis or free air. There is diffuse colonic diverticulosis. There is a large amount of stool throughout the colon. The appendix is not seen. There is wall thickening of the mid sigmoid colon without surrounding inflammatory stranding. There is a new collection containing presumed fecal matter and air in the left lower quadrant measuring 6.8 x 6.2 by 6.9 cm. This is inferior to and abutting the sigmoid colon without surrounding inflammation. Small bowel and stomach are within normal limits. Lymphatic: No enlarged lymph nodes are identified. Reproductive: Small calcified fibroids are present. Adnexa are within normal limits. Other: No abdominal wall hernia or abnormality. No abdominopelvic ascites. Musculoskeletal: No acute osseous abnormality. There is soft tissue gas and edema within the abductor thigh musculature and anterior thigh musculature. Review of the MIP images confirms the above findings. IMPRESSION: VASCULAR 1. No evidence for aortic dissection or aneurysm. 2. 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 3. Moderate focal stenosis in the proximal left superficial femoral artery. 4. Severe focal stenosis and possible focal thrombosis in the proximal right profunda femoral artery. Aortic Atherosclerosis (ICD10-I70.0). NON_VASCULAR 1. New 6.9 cm collection containing presumed fecal matter and air in the left lower quadrant abutting the sigmoid colon. Findings may represent a contained perforation of diverticulum with abscess formation. Please correlate clinically. 2. Wall thickening of the mid sigmoid colon without surrounding inflammatory stranding. Findings may be related to colitis/diverticulitis. Underlying lesion can not be excluded. 3. Large  amount of stool throughout the colon. 4. Soft tissue gas and edema within the left abductor thigh musculature and anterior thigh musculature worrisome for infection/myositis/fasciitis including necrotizing fasciitis. 5. Stable ill-defined hypodensity in the central liver favored as abscess. 6. New bilateral adrenal thickening. These results were called by telephone at  the time of interpretation on 10/24/2023 at 1:38 am to provider [facility] , who verbally acknowledged these results. Electronically Signed   By: Darliss Cheney M.D.   On: 10/24/2023 01:41   DG Chest Port 1 View Result Date: 10/23/2023 CLINICAL DATA:  Questionable sepsis EXAM: PORTABLE CHEST 1 VIEW COMPARISON:  Chest x-ray 09/14/2023 FINDINGS: The heart size and mediastinal contours are within normal limits. Both lungs are clear. The visualized skeletal structures are unremarkable. IMPRESSION: No active disease. Electronically Signed   By: Darliss Cheney M.D.   On: 10/23/2023 23:49    I have personally reviewed the relevant CT imaging and labwork  Pt's vitals are stable and there are no signs of end organ failure  A/P: Farah Lepak is an 73 y.o. female with LLQ abscess for apparent diverticulitis that is tracking into her L thigh muscle compartments.  Given the prolonged timing of her symptoms, the lack of external lesions and the lack of fluid and gas outside the muscle compartments, this does not appear to be necrotizing fascitis.  There are no fluid collection in her thigh and therefore, I would treat with antibiotics and close monitoring.  Will ask IR to drain abdominal abscess, as this seems to the source of her problem.  Cont aggressive fluid resuscitation.  NPO for now   I spent a total of 84 minutes in both face-to-face and non-face-to-face activities, excluding procedures performed, for this visit on the date of this encounter.  High medical decision making.   Vanita Panda, MD  Colorectal and General  Surgery [facility] Surgery

## 2023-10-24 NOTE — H&P (Deleted)
CC: L leg pain  HPI: Ashley Galloway is an 73 y.o. female who is here for increasing pain in her L leg.  She states that she has had L thigh swelling for ~2 weeks.  It has increasingly gotten worse.  She denies any abd pain or change in bowel habits.  She has a recent h/o liver abscess and perc drainage ~1 month ago.    Past Medical History:  Diagnosis Date   Centrilobular emphysema (HCC)    Colon polyps    Looks like saw Dr. Marca Ancona in 2021   Diverticulitis    HLD (hyperlipidemia)    TIA (transient ischemic attack)    Vesicointestinal fistula     Past Surgical History:  Procedure Laterality Date   CHOLECYSTECTOMY     IR GUIDED DRAIN W CATHETER PLACEMENT  09/16/2023   PARTIAL COLECTOMY     Tubulovillous adenoma   SPINE SURGERY      Family History  Problem Relation Age of Onset   Breast cancer Neg Hx     Social:  reports that she has been smoking cigarettes. She has been exposed to tobacco smoke. She has never used smokeless tobacco. No history on file for alcohol use and drug use.  Allergies:  Allergies  Allergen Reactions   Tape     Tape from hormone patch.    Medications: I have reviewed the patient's current medications.  Results for orders placed or performed during the hospital encounter of 10/23/23 (from the past 48 hours)  Resp panel by RT-PCR (RSV, Flu A&B, Covid) Anterior Nasal Swab     Status: None   Collection Time: 10/23/23 11:37 PM   Specimen: Anterior Nasal Swab  Result Value Ref Range   SARS Coronavirus 2 by RT PCR NEGATIVE NEGATIVE    Comment: (NOTE) SARS-CoV-2 target nucleic acids are NOT DETECTED.  The SARS-CoV-2 RNA is generally detectable in upper respiratory specimens during the acute phase of infection. The lowest concentration of SARS-CoV-2 viral copies this assay can detect is 138 copies/mL. A negative result does not preclude SARS-Cov-2 infection and should not be used as the sole basis for treatment or other patient  management decisions. A negative result may occur with  improper specimen collection/handling, submission of specimen other than nasopharyngeal swab, presence of viral mutation(s) within the areas targeted by this assay, and inadequate number of viral copies(<138 copies/mL). A negative result must be combined with clinical observations, patient history, and epidemiological information. The expected result is Negative.  Fact Sheet for Patients:  BloggerCourse.com  Fact Sheet for Healthcare Providers:  SeriousBroker.it  This test is no t yet approved or cleared by the [place] FDA and  has been authorized for detection and/or diagnosis of SARS-CoV-2 by FDA under an Emergency Use Authorization (EUA). This EUA will remain  in effect (meaning this test can be used) for the duration of the COVID-19 declaration under Section 564(b)(1) of the Act, 21 U.S.C.section 360bbb-3(b)(1), unless the authorization is terminated  or revoked sooner.       Influenza A by PCR NEGATIVE NEGATIVE   Influenza B by PCR NEGATIVE NEGATIVE    Comment: (NOTE) The Xpert Xpress SARS-CoV-2/FLU/RSV plus assay is intended as an aid in the diagnosis of influenza from Nasopharyngeal swab specimens and should not be used as a sole basis for treatment. Nasal washings and aspirates are unacceptable for Xpert Xpress SARS-CoV-2/FLU/RSV testing.  Fact Sheet for Patients: BloggerCourse.com  Fact Sheet for Healthcare Providers: SeriousBroker.it  This test is not  yet approved or cleared by the [place] and has been authorized for detection and/or diagnosis of SARS-CoV-2 by FDA under an Emergency Use Authorization (EUA). This EUA will remain in effect (meaning this test can be used) for the duration of the COVID-19 declaration under Section 564(b)(1) of the Act, 21 U.S.C. section 360bbb-3(b)(1), unless the  authorization is terminated or revoked.     Resp Syncytial Virus by PCR NEGATIVE NEGATIVE    Comment: (NOTE) Fact Sheet for Patients: BloggerCourse.com  Fact Sheet for Healthcare Providers: SeriousBroker.it  This test is not yet approved or cleared by the [place] FDA and has been authorized for detection and/or diagnosis of SARS-CoV-2 by FDA under an Emergency Use Authorization (EUA). This EUA will remain in effect (meaning this test can be used) for the duration of the COVID-19 declaration under Section 564(b)(1) of the Act, 21 U.S.C. section 360bbb-3(b)(1), unless the authorization is terminated or revoked.  Performed at [facility], [address] [place], [place] [address]   Comprehensive metabolic panel     Status: Abnormal   Collection Time: 10/23/23 11:37 PM  Result Value Ref Range   Sodium 131 (L) 135 - 145 mmol/L   Potassium 5.2 (H) 3.5 - 5.1 mmol/L   Chloride 96 (L) 98 - 111 mmol/L   CO2 19 (L) 22 - 32 mmol/L   Glucose, Bld 181 (H) 70 - 99 mg/dL    Comment: Glucose reference range applies only to samples taken after fasting for at least 8 hours.   BUN 59 (H) 8 - 23 mg/dL   Creatinine, Ser 0.98 (H) 0.44 - 1.00 mg/dL   Calcium 9.6 8.9 - 11.9 mg/dL   Total Protein 8.0 6.5 - 8.1 g/dL   Albumin 2.2 (L) 3.5 - 5.0 g/dL   AST 64 (H) 15 - 41 U/L   ALT 36 0 - 44 U/L   Alkaline Phosphatase 119 38 - 126 U/L   Total Bilirubin 1.3 (H) 0.0 - 1.2 mg/dL   GFR, Estimated 42 (L) >60 mL/min    Comment: (NOTE) Calculated using the CKD-EPI Creatinine Equation (2021)    Anion gap 16 (H) 5 - 15    Comment: Performed at [facility], [address] [place], [place] [address]  CBC with Differential     Status: Abnormal   Collection Time: 10/23/23 11:37 PM  Result Value Ref Range   WBC 17.7 (H) 4.0 - 10.5 K/uL   RBC 5.31 (H) 3.87 - 5.11 MIL/uL   Hemoglobin 14.8 12.0 - 15.0 g/dL   HCT  95.6 21.3 - 08.6 %   MCV 86.4 80.0 - 100.0 fL   MCH 27.9 26.0 - 34.0 pg   MCHC 32.2 30.0 - 36.0 g/dL   RDW 57.8 46.9 - 62.9 %   Platelets 606 (H) 150 - 400 K/uL   nRBC 0.0 0.0 - 0.2 %   Neutrophils Relative % 60 %   Lymphocytes Relative 10 %   Monocytes Relative 3 %   Eosinophils Relative 0 %   Basophils Relative 0 %   Band Neutrophils 22 %   Immature Granulocytes 0 %   Metamyelocytes Relative 3 %   Myelocytes 2 %   Promyelocytes Relative 0 %   Blasts 0 %   nRBC 0 0 /100 WBC   Other 0 %   Neutro Abs 14.5 (H) 1.7 - 7.7 K/uL   Lymphs Abs 1.8 0.7 - 4.0 K/uL   Monocytes Absolute 0.5 0.1 - 1.0  K/uL   Eosinophils Absolute 0.0 0.0 - 0.5 K/uL   Basophils Absolute 0.0 0.0 - 0.1 K/uL   Abs Immature Granulocytes 0.89 (H) 0.00 - 0.07 K/uL   WBC Morphology INCREASED BANDS (>20% BANDS)     Comment: Performed at [facility], [address] [place], [place] [address]  Protime-INR     Status: Abnormal   Collection Time: 10/23/23 11:37 PM  Result Value Ref Range   Prothrombin Time 18.9 (H) 11.4 - 15.2 seconds   INR 1.6 (H) 0.8 - 1.2    Comment: (NOTE) INR goal varies based on device and disease states. Performed at [facility], [address] [place], [place] [address]   APTT     Status: None   Collection Time: 10/23/23 11:37 PM  Result Value Ref Range   aPTT 24 24 - 36 seconds    Comment: Performed at [facility], [address] [place], [place] [address]  Troponin I (High Sensitivity)     Status: Abnormal   Collection Time: 10/23/23 11:37 PM  Result Value Ref Range   Troponin I (High Sensitivity) 27 (H) <18 ng/L    Comment: (NOTE) Elevated high sensitivity troponin I (hsTnI) values and significant  changes across serial measurements may suggest ACS but many other  chronic and acute conditions are known to elevate hsTnI results.  Refer to the "Links" section for chest pain algorithms and additional  guidance. Performed  at [facility], [address] [place], [place] [address]   I-Stat Lactic Acid, ED     Status: Abnormal   Collection Time: 10/23/23 11:47 PM  Result Value Ref Range   Lactic Acid, Venous 6.0 (HH) 0.5 - 1.9 mmol/L  Urinalysis, w/ Reflex to Culture (Infection Suspected) -Urine, Clean Catch     Status: Abnormal   Collection Time: 10/24/23 12:41 AM  Result Value Ref Range   Specimen Source URINE, CLEAN CATCH    Color, Urine YELLOW YELLOW   APPearance HAZY (A) CLEAR   Specific Gravity, Urine 1.026 1.005 - 1.030   pH 5.0 5.0 - 8.0   Glucose, UA NEGATIVE NEGATIVE mg/dL   Hgb urine dipstick NEGATIVE NEGATIVE   Bilirubin Urine NEGATIVE NEGATIVE   Ketones, ur NEGATIVE NEGATIVE mg/dL   Protein, ur 30 (A) NEGATIVE mg/dL   Nitrite NEGATIVE NEGATIVE   Leukocytes,Ua NEGATIVE NEGATIVE   RBC / HPF 0-5 0 - 5 RBC/hpf   WBC, UA 0-5 0 - 5 WBC/hpf    Comment:        Reflex urine culture not performed if WBC <=10, OR if Squamous epithelial cells >5. If Squamous epithelial cells >5 suggest recollection.    Bacteria, UA NONE SEEN NONE SEEN   Squamous Epithelial / HPF 0-5 0 - 5 /HPF    Comment: Performed at [facility], [address], [place], [place] [address]  Troponin I (High Sensitivity)     Status: Abnormal   Collection Time: 10/24/23  1:45 AM  Result Value Ref Range   Troponin I (High Sensitivity) 21 (H) <18 ng/L    Comment: (NOTE) Elevated high sensitivity troponin I (hsTnI) values and significant  changes across serial measurements may suggest ACS but many other  chronic and acute conditions are known to elevate hsTnI results.  Refer to the "Links" section for chest pain algorithms and additional  guidance. Performed at [facility], [address] [place], [place] [address]   I-Stat Lactic Acid, ED  Status: Abnormal   Collection Time: 10/24/23  1:58 AM  Result Value Ref Range   Lactic Acid, Venous 6.7 (HH) 0.5 - 1.9  mmol/L   Comment NOTIFIED PHYSICIAN   CBG monitoring, ED     Status: Abnormal   Collection Time: 10/24/23  4:09 AM  Result Value Ref Range   Glucose-Capillary 118 (H) 70 - 99 mg/dL    Comment: Glucose reference range applies only to samples taken after fasting for at least 8 hours.   Comment 1 Notify RN   Hemoglobin A1c     Status: Abnormal   Collection Time: 10/24/23  4:10 AM  Result Value Ref Range   Hgb A1c MFr Bld 6.3 (H) 4.8 - 5.6 %    Comment: (NOTE) Pre diabetes:          5.7%-6.4%  Diabetes:              >6.4%  Glycemic control for   <7.0% adults with diabetes    Mean Plasma Glucose 134.11 mg/dL    Comment: Performed at [facility], [address] [place], [place] [address]  Basic metabolic panel     Status: Abnormal   Collection Time: 10/24/23  4:10 AM  Result Value Ref Range   Sodium 130 (L) 135 - 145 mmol/L   Potassium 4.2 3.5 - 5.1 mmol/L   Chloride 98 98 - 111 mmol/L   CO2 20 (L) 22 - 32 mmol/L   Glucose, Bld 140 (H) 70 - 99 mg/dL    Comment: Glucose reference range applies only to samples taken after fasting for at least 8 hours.   BUN 51 (H) 8 - 23 mg/dL   Creatinine, Ser 1.91 0.44 - 1.00 mg/dL   Calcium 8.4 (L) 8.9 - 10.3 mg/dL   GFR, Estimated >47 >82 mL/min    Comment: (NOTE) Calculated using the CKD-EPI Creatinine Equation (2021)    Anion gap 12 5 - 15    Comment: Performed at [facility], [address] [place], [place] [address]  Hepatic function panel     Status: Abnormal   Collection Time: 10/24/23  4:10 AM  Result Value Ref Range   Total Protein 5.8 (L) 6.5 - 8.1 g/dL   Albumin 1.6 (L) 3.5 - 5.0 g/dL   AST 51 (H) 15 - 41 U/L   ALT 30 0 - 44 U/L   Alkaline Phosphatase 86 38 - 126 U/L   Total Bilirubin 0.7 0.0 - 1.2 mg/dL   Bilirubin, Direct 0.3 (H) 0.0 - 0.2 mg/dL   Indirect Bilirubin 0.4 0.3 - 0.9 mg/dL    Comment: Performed at [facility], [address], [place], [place] [address]  CBC      Status: Abnormal   Collection Time: 10/24/23  4:10 AM  Result Value Ref Range   WBC 11.2 (H) 4.0 - 10.5 K/uL   RBC 4.28 3.87 - 5.11 MIL/uL   Hemoglobin 12.1 12.0 - 15.0 g/dL   HCT 78.4 69.6 - 29.5 %   MCV 87.4 80.0 - 100.0 fL   MCH 28.3 26.0 - 34.0 pg   MCHC 32.4 30.0 - 36.0 g/dL   RDW 28.4 13.2 - 44.0 %   Platelets 456 (H) 150 - 400 K/uL   nRBC 0.0 0.0 - 0.2 %    Comment: Performed at [facility], [address] [place], [place] [address]  Lactic acid, plasma     Status: Abnormal   Collection Time: 10/24/23  4:10 AM  Result Value Ref Range   Lactic Acid, Venous 5.9 (HH) 0.5 - 1.9 mmol/L    Comment: CRITICAL RESULT CALLED TO, READ BACK BY AND VERIFIED WITH ESTRADA G. @ 7829 10/24/23 MCLEAN K. Performed at [facility], [address], [place], [place] [address]     CT Angio Chest/Abd/Pel for Dissection W and/or Wo Contrast Result Date: 10/24/2023 CLINICAL DATA:  Septic arterial embolism. Left leg pain and swelling. History of liver abscess drained on 08/20/2024. EXAM: CT ANGIOGRAPHY CHEST, ABDOMEN AND PELVIS TECHNIQUE: Non-contrast CT of the chest was initially obtained. Multidetector CT imaging through the chest, abdomen and pelvis was performed using the standard protocol during bolus administration of intravenous contrast. Multiplanar reconstructed images and MIPs were obtained and reviewed to evaluate the vascular anatomy. RADIATION DOSE REDUCTION: This exam was performed according to the departmental dose-optimization program which includes automated exposure control, adjustment of the mA and/or kV according to patient size and/or use of iterative reconstruction technique. CONTRAST:  OMNIPAQUE IOHEXOL 350 MG/ML SOLN COMPARISON:  CT abdomen 10/21/2023. CT abdomen and pelvis 09/14/2023. FINDINGS: CTA CHEST FINDINGS Cardiovascular: Preferential opacification of the thoracic aorta. No evidence of thoracic aortic aneurysm or dissection. Normal  heart size. No pericardial effusion. There is calcified atherosclerotic disease throughout the aorta. Bovine arch anatomy is noted. Mediastinum/Nodes: No enlarged mediastinal, hilar, or axillary lymph nodes. Thyroid gland, trachea, and esophagus demonstrate no significant findings. Lungs/Pleura: Are minimal atelectatic changes in the bilateral lower lobes and lingula. There is no pleural effusion or pneumothorax. Musculoskeletal: No chest wall abnormality. No acute or significant osseous findings. Review of the MIP images confirms the above findings. CTA ABDOMEN AND PELVIS FINDINGS VASCULAR Aorta: Normal caliber aorta without aneurysm, dissection, vasculitis or significant stenosis. There is calcified atherosclerotic disease throughout the aorta. 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 Otherwise, the celiac artery and its branches appear within normal limits. SMA: Patent without evidence of aneurysm, dissection, vasculitis or significant stenosis. Renals: There is mild-to-moderate focal stenosis of the origin of the left renal artery secondary to atherosclerotic disease. Renal arteries are otherwise within normal limits. IMA: Patent without evidence of aneurysm, dissection, vasculitis or significant stenosis. Inflow: Patent without evidence of aneurysm, dissection, vasculitis or significant stenosis. Calcified atherosclerosis is present. Other: There is moderate focal stenosis in the proximal left superficial femoral artery. There severe focal stenosis and possible focal thrombosis in the proximal right profunda femoral artery. Veins: No obvious venous abnormality within the limitations of this arterial phase study. Review of the MIP images confirms the above findings. NON-VASCULAR Hepatobiliary: Percutaneous drainage catheter has been removed. Ill-defined heterogeneous hypodensity is again seen in the central liver measuring proximally 5.9 x 3.8 cm  similar to the prior examination. Rounded hypodensity in the dome of the liver is unchanged and favored as a cyst. Gallbladder is surgically absent. There stable common bile duct dilatation. Pancreas: Unremarkable. No pancreatic ductal dilatation or surrounding inflammatory changes. Spleen: Normal in size without focal abnormality. Adrenals/Urinary Tract: There some bilateral adrenal thickening which is new from prior. There is no hydronephrosis or perinephric stranding. There is a cyst in the superior pole the right kidney measuring 1.7 cm. Bladder is within normal limits. Stomach/Bowel: There is no evidence for bowel obstruction, pneumatosis or free air. There is diffuse colonic diverticulosis. There is a large amount of stool throughout the colon. The appendix is not seen. There is wall thickening of the mid sigmoid  colon without surrounding inflammatory stranding. There is a new collection containing presumed fecal matter and air in the left lower quadrant measuring 6.8 x 6.2 by 6.9 cm. This is inferior to and abutting the sigmoid colon without surrounding inflammation. Small bowel and stomach are within normal limits. Lymphatic: No enlarged lymph nodes are identified. Reproductive: Small calcified fibroids are present. Adnexa are within normal limits. Other: No abdominal wall hernia or abnormality. No abdominopelvic ascites. Musculoskeletal: No acute osseous abnormality. There is soft tissue gas and edema within the abductor thigh musculature and anterior thigh musculature. Review of the MIP images confirms the above findings. IMPRESSION: VASCULAR 1. No evidence for aortic dissection or aneurysm. 2. 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 3. Moderate focal stenosis in the proximal left superficial femoral artery. 4. Severe focal stenosis and possible focal thrombosis in the proximal right profunda femoral artery. Aortic Atherosclerosis (ICD10-I70.0). NON_VASCULAR 1. New 6.9 cm collection  containing presumed fecal matter and air in the left lower quadrant abutting the sigmoid colon. Findings may represent a contained perforation of diverticulum with abscess formation. Please correlate clinically. 2. Wall thickening of the mid sigmoid colon without surrounding inflammatory stranding. Findings may be related to colitis/diverticulitis. Underlying lesion can not be excluded. 3. Large amount of stool throughout the colon. 4. Soft tissue gas and edema within the left abductor thigh musculature and anterior thigh musculature worrisome for infection/myositis/fasciitis including necrotizing fasciitis. 5. Stable ill-defined hypodensity in the central liver favored as abscess. 6. New bilateral adrenal thickening. These results were called by telephone at the time of interpretation on 10/24/2023 at 1:38 am to provider [facility] , who verbally acknowledged these results. Electronically Signed   By: Darliss Cheney M.D.   On: 10/24/2023 01:41   DG Chest Port 1 View Result Date: 10/23/2023 CLINICAL DATA:  Questionable sepsis EXAM: PORTABLE CHEST 1 VIEW COMPARISON:  Chest x-ray 09/14/2023 FINDINGS: The heart size and mediastinal contours are within normal limits. Both lungs are clear. The visualized skeletal structures are unremarkable. IMPRESSION: No active disease. Electronically Signed   By: Darliss Cheney M.D.   On: 10/23/2023 23:49    ROS - all of the below systems have been reviewed with the patient and positives are indicated with bold text General: chills, fever or night sweats Eyes: blurry vision or double vision ENT: epistaxis or sore throat Allergy/Immunology: itchy/watery eyes or nasal congestion Hematologic/Lymphatic: bleeding problems, blood clots or swollen lymph nodes Endocrine: temperature intolerance or unexpected weight changes Breast: new or changing breast lumps or nipple discharge Resp: cough, shortness of breath, or wheezing CV: chest pain or dyspnea on exertion GI: as per  HPI GU: dysuria, trouble voiding, or hematuria MSK: joint pain or joint stiffness Neuro: TIA or stroke symptoms Derm: pruritus and skin lesion changes Psych: anxiety and depression  PE Blood pressure (!) 108/49, pulse 91, temperature 100.3 F (37.9 C), resp. rate (!) 23, weight 71.3 kg, SpO2 100%. Constitutional: NAD; conversant; no deformities Eyes: Moist conjunctiva; no lid lag; anicteric; PERRL Neck: Trachea midline; no thyromegaly Lungs: Normal respiratory effort; no tactile fremitus CV: RRR; no palpable thrills; no pitting edema GI: Abd soft, mild TTP LLQ; no palpable hepatosplenomegaly MSK: L thigh edema, no external lesions.  +pedal pulse, no erythema Psychiatric: Appropriate affect; alert and oriented x3 Lymphatic: No palpable cervical or axillary lymphadenopathy  Results for orders placed or performed during the hospital encounter of 10/23/23 (from the past 48 hours)  Resp panel by RT-PCR (RSV, Flu A&B, Covid) Anterior  Nasal Swab     Status: None   Collection Time: 10/23/23 11:37 PM   Specimen: Anterior Nasal Swab  Result Value Ref Range   SARS Coronavirus 2 by RT PCR NEGATIVE NEGATIVE    Comment: (NOTE) SARS-CoV-2 target nucleic acids are NOT DETECTED.  The SARS-CoV-2 RNA is generally detectable in upper respiratory specimens during the acute phase of infection. The lowest concentration of SARS-CoV-2 viral copies this assay can detect is 138 copies/mL. A negative result does not preclude SARS-Cov-2 infection and should not be used as the sole basis for treatment or other patient management decisions. A negative result may occur with  improper specimen collection/handling, submission of specimen other than nasopharyngeal swab, presence of viral mutation(s) within the areas targeted by this assay, and inadequate number of viral copies(<138 copies/mL). A negative result must be combined with clinical observations, patient history, and epidemiological information. The  expected result is Negative.  Fact Sheet for Patients:  BloggerCourse.com  Fact Sheet for Healthcare Providers:  SeriousBroker.it  This test is no t yet approved or cleared by the [place] FDA and  has been authorized for detection and/or diagnosis of SARS-CoV-2 by FDA under an Emergency Use Authorization (EUA). This EUA will remain  in effect (meaning this test can be used) for the duration of the COVID-19 declaration under Section 564(b)(1) of the Act, 21 U.S.C.section 360bbb-3(b)(1), unless the authorization is terminated  or revoked sooner.       Influenza A by PCR NEGATIVE NEGATIVE   Influenza B by PCR NEGATIVE NEGATIVE    Comment: (NOTE) The Xpert Xpress SARS-CoV-2/FLU/RSV plus assay is intended as an aid in the diagnosis of influenza from Nasopharyngeal swab specimens and should not be used as a sole basis for treatment. Nasal washings and aspirates are unacceptable for Xpert Xpress SARS-CoV-2/FLU/RSV testing.  Fact Sheet for Patients: BloggerCourse.com  Fact Sheet for Healthcare Providers: SeriousBroker.it  This test is not yet approved or cleared by the [place] FDA and has been authorized for detection and/or diagnosis of SARS-CoV-2 by FDA under an Emergency Use Authorization (EUA). This EUA will remain in effect (meaning this test can be used) for the duration of the COVID-19 declaration under Section 564(b)(1) of the Act, 21 U.S.C. section 360bbb-3(b)(1), unless the authorization is terminated or revoked.     Resp Syncytial Virus by PCR NEGATIVE NEGATIVE    Comment: (NOTE) Fact Sheet for Patients: BloggerCourse.com  Fact Sheet for Healthcare Providers: SeriousBroker.it  This test is not yet approved or cleared by the [place] FDA and has been authorized for detection and/or diagnosis of  SARS-CoV-2 by FDA under an Emergency Use Authorization (EUA). This EUA will remain in effect (meaning this test can be used) for the duration of the COVID-19 declaration under Section 564(b)(1) of the Act, 21 U.S.C. section 360bbb-3(b)(1), unless the authorization is terminated or revoked.  Performed at [facility], [address] [place], [place] [address]   Comprehensive metabolic panel     Status: Abnormal   Collection Time: 10/23/23 11:37 PM  Result Value Ref Range   Sodium 131 (L) 135 - 145 mmol/L   Potassium 5.2 (H) 3.5 - 5.1 mmol/L   Chloride 96 (L) 98 - 111 mmol/L   CO2 19 (L) 22 - 32 mmol/L   Glucose, Bld 181 (H) 70 - 99 mg/dL    Comment: Glucose reference range applies only to samples taken after fasting for at least 8 hours.   BUN 59 (H) 8 - 23  mg/dL   Creatinine, Ser 6.57 (H) 0.44 - 1.00 mg/dL   Calcium 9.6 8.9 - 84.6 mg/dL   Total Protein 8.0 6.5 - 8.1 g/dL   Albumin 2.2 (L) 3.5 - 5.0 g/dL   AST 64 (H) 15 - 41 U/L   ALT 36 0 - 44 U/L   Alkaline Phosphatase 119 38 - 126 U/L   Total Bilirubin 1.3 (H) 0.0 - 1.2 mg/dL   GFR, Estimated 42 (L) >60 mL/min    Comment: (NOTE) Calculated using the CKD-EPI Creatinine Equation (2021)    Anion gap 16 (H) 5 - 15    Comment: Performed at [facility], [address] [place], [place] [address]  CBC with Differential     Status: Abnormal   Collection Time: 10/23/23 11:37 PM  Result Value Ref Range   WBC 17.7 (H) 4.0 - 10.5 K/uL   RBC 5.31 (H) 3.87 - 5.11 MIL/uL   Hemoglobin 14.8 12.0 - 15.0 g/dL   HCT 28.4 13.2 - 44.0 %   MCV 86.4 80.0 - 100.0 fL   MCH 27.9 26.0 - 34.0 pg   MCHC 32.2 30.0 - 36.0 g/dL   RDW 10.2 72.5 - 36.6 %   Platelets 606 (H) 150 - 400 K/uL   nRBC 0.0 0.0 - 0.2 %   Neutrophils Relative % 60 %   Lymphocytes Relative 10 %   Monocytes Relative 3 %   Eosinophils Relative 0 %   Basophils Relative 0 %   Band Neutrophils 22 %   Immature Granulocytes 0 %    Metamyelocytes Relative 3 %   Myelocytes 2 %   Promyelocytes Relative 0 %   Blasts 0 %   nRBC 0 0 /100 WBC   Other 0 %   Neutro Abs 14.5 (H) 1.7 - 7.7 K/uL   Lymphs Abs 1.8 0.7 - 4.0 K/uL   Monocytes Absolute 0.5 0.1 - 1.0 K/uL   Eosinophils Absolute 0.0 0.0 - 0.5 K/uL   Basophils Absolute 0.0 0.0 - 0.1 K/uL   Abs Immature Granulocytes 0.89 (H) 0.00 - 0.07 K/uL   WBC Morphology INCREASED BANDS (>20% BANDS)     Comment: Performed at [facility], [address], [place], [place] [address]  Protime-INR     Status: Abnormal   Collection Time: 10/23/23 11:37 PM  Result Value Ref Range   Prothrombin Time 18.9 (H) 11.4 - 15.2 seconds   INR 1.6 (H) 0.8 - 1.2    Comment: (NOTE) INR goal varies based on device and disease states. Performed at [facility], [address] [place], [place] [address]   APTT     Status: None   Collection Time: 10/23/23 11:37 PM  Result Value Ref Range   aPTT 24 24 - 36 seconds    Comment: Performed at [facility], [address], [place], [place] [address]  Troponin I (High Sensitivity)     Status: Abnormal   Collection Time: 10/23/23 11:37 PM  Result Value Ref Range   Troponin I (High Sensitivity) 27 (H) <18 ng/L    Comment: (NOTE) Elevated high sensitivity troponin I (hsTnI) values and significant  changes across serial measurements may suggest ACS but many other  chronic and acute conditions are known to elevate hsTnI results.  Refer to the "Links" section for chest pain algorithms and additional  guidance. Performed at [facility], [address], [place], [place] [address]   I-Stat Lactic Acid, ED  Status: Abnormal   Collection Time: 10/23/23 11:47 PM  Result Value Ref Range   Lactic Acid, Venous 6.0 (HH) 0.5 - 1.9 mmol/L  Urinalysis, w/ Reflex to Culture (Infection Suspected) -Urine, Clean Catch     Status: Abnormal   Collection Time: 10/24/23 12:41 AM  Result  Value Ref Range   Specimen Source URINE, CLEAN CATCH    Color, Urine YELLOW YELLOW   APPearance HAZY (A) CLEAR   Specific Gravity, Urine 1.026 1.005 - 1.030   pH 5.0 5.0 - 8.0   Glucose, UA NEGATIVE NEGATIVE mg/dL   Hgb urine dipstick NEGATIVE NEGATIVE   Bilirubin Urine NEGATIVE NEGATIVE   Ketones, ur NEGATIVE NEGATIVE mg/dL   Protein, ur 30 (A) NEGATIVE mg/dL   Nitrite NEGATIVE NEGATIVE   Leukocytes,Ua NEGATIVE NEGATIVE   RBC / HPF 0-5 0 - 5 RBC/hpf   WBC, UA 0-5 0 - 5 WBC/hpf    Comment:        Reflex urine culture not performed if WBC <=10, OR if Squamous epithelial cells >5. If Squamous epithelial cells >5 suggest recollection.    Bacteria, UA NONE SEEN NONE SEEN   Squamous Epithelial / HPF 0-5 0 - 5 /HPF    Comment: Performed at [facility] [address], [place], [place] [address]  Troponin I (High Sensitivity)     Status: Abnormal   Collection Time: 10/24/23  1:45 AM  Result Value Ref Range   Troponin I (High Sensitivity) 21 (H) <18 ng/L    Comment: (NOTE) Elevated high sensitivity troponin I (hsTnI) values and significant  changes across serial measurements may suggest ACS but many other  chronic and acute conditions are known to elevate hsTnI results.  Refer to the "Links" section for chest pain algorithms and additional  guidance. Performed at [facility], [address], [place], [place] [address]   I-Stat Lactic Acid, ED     Status: Abnormal   Collection Time: 10/24/23  1:58 AM  Result Value Ref Range   Lactic Acid, Venous 6.7 (HH) 0.5 - 1.9 mmol/L   Comment NOTIFIED PHYSICIAN   CBG monitoring, ED     Status: Abnormal   Collection Time: 10/24/23  4:09 AM  Result Value Ref Range   Glucose-Capillary 118 (H) 70 - 99 mg/dL    Comment: Glucose reference range applies only to samples taken after fasting for at least 8 hours.   Comment 1 Notify RN   Hemoglobin A1c     Status: Abnormal   Collection Time: 10/24/23  4:10 AM   Result Value Ref Range   Hgb A1c MFr Bld 6.3 (H) 4.8 - 5.6 %    Comment: (NOTE) Pre diabetes:          5.7%-6.4%  Diabetes:              >6.4%  Glycemic control for   <7.0% adults with diabetes    Mean Plasma Glucose 134.11 mg/dL    Comment: Performed at [facility], [address] [place], [place] [address]  Basic metabolic panel     Status: Abnormal   Collection Time: 10/24/23  4:10 AM  Result Value Ref Range   Sodium 130 (L) 135 - 145 mmol/L   Potassium 4.2 3.5 - 5.1 mmol/L   Chloride 98 98 - 111 mmol/L   CO2 20 (L) 22 - 32 mmol/L   Glucose, Bld 140 (H) 70 - 99 mg/dL    Comment: Glucose reference range applies only  to samples taken after fasting for at least 8 hours.   BUN 51 (H) 8 - 23 mg/dL   Creatinine, Ser 1.61 0.44 - 1.00 mg/dL   Calcium 8.4 (L) 8.9 - 10.3 mg/dL   GFR, Estimated >09 >60 mL/min    Comment: (NOTE) Calculated using the CKD-EPI Creatinine Equation (2021)    Anion gap 12 5 - 15    Comment: Performed at [facility], [address], [place], [place] [address]  Hepatic function panel     Status: Abnormal   Collection Time: 10/24/23  4:10 AM  Result Value Ref Range   Total Protein 5.8 (L) 6.5 - 8.1 g/dL   Albumin 1.6 (L) 3.5 - 5.0 g/dL   AST 51 (H) 15 - 41 U/L   ALT 30 0 - 44 U/L   Alkaline Phosphatase 86 38 - 126 U/L   Total Bilirubin 0.7 0.0 - 1.2 mg/dL   Bilirubin, Direct 0.3 (H) 0.0 - 0.2 mg/dL   Indirect Bilirubin 0.4 0.3 - 0.9 mg/dL    Comment: Performed at [facility], [address], [place], [place] [address]  CBC     Status: Abnormal   Collection Time: 10/24/23  4:10 AM  Result Value Ref Range   WBC 11.2 (H) 4.0 - 10.5 K/uL   RBC 4.28 3.87 - 5.11 MIL/uL   Hemoglobin 12.1 12.0 - 15.0 g/dL   HCT 47.8 29.5 - 62.1 %   MCV 87.4 80.0 - 100.0 fL   MCH 28.3 26.0 - 34.0 pg   MCHC 32.4 30.0 - 36.0 g/dL   RDW 30.8 65.7 - 84.6 %   Platelets 456 (H) 150 - 400 K/uL   nRBC 0.0 0.0 - 0.2 %    Comment:  Performed at [facility], [address] [place], [place] [address]  Lactic acid, plasma     Status: Abnormal   Collection Time: 10/24/23  4:10 AM  Result Value Ref Range   Lactic Acid, Venous 5.9 (HH) 0.5 - 1.9 mmol/L    Comment: CRITICAL RESULT CALLED TO, READ BACK BY AND VERIFIED WITH ESTRADA G. @ 2841 10/24/23 MCLEAN K. Performed at [facility], [address] [place], [place] [address]     CT Angio Chest/Abd/Pel for Dissection W and/or Wo Contrast Result Date: 10/24/2023 CLINICAL DATA:  Septic arterial embolism. Left leg pain and swelling. History of liver abscess drained on 08/20/2024. EXAM: CT ANGIOGRAPHY CHEST, ABDOMEN AND PELVIS TECHNIQUE: Non-contrast CT of the chest was initially obtained. Multidetector CT imaging through the chest, abdomen and pelvis was performed using the standard protocol during bolus administration of intravenous contrast. Multiplanar reconstructed images and MIPs were obtained and reviewed to evaluate the vascular anatomy. RADIATION DOSE REDUCTION: This exam was performed according to the departmental dose-optimization program which includes automated exposure control, adjustment of the mA and/or kV according to patient size and/or use of iterative reconstruction technique. CONTRAST:  OMNIPAQUE IOHEXOL 350 MG/ML SOLN COMPARISON:  CT abdomen 10/21/2023. CT abdomen and pelvis 09/14/2023. FINDINGS: CTA CHEST FINDINGS Cardiovascular: Preferential opacification of the thoracic aorta. No evidence of thoracic aortic aneurysm or dissection. Normal heart size. No pericardial effusion. There is calcified atherosclerotic disease throughout the aorta. Bovine arch anatomy is noted. Mediastinum/Nodes: No enlarged mediastinal, hilar, or axillary lymph nodes. Thyroid gland, trachea, and esophagus demonstrate no significant findings. Lungs/Pleura: Are minimal atelectatic changes in the bilateral lower lobes and lingula. There is no pleural  effusion or pneumothorax. Musculoskeletal: No chest  wall abnormality. No acute or significant osseous findings. Review of the MIP images confirms the above findings. CTA ABDOMEN AND PELVIS FINDINGS VASCULAR Aorta: Normal caliber aorta without aneurysm, dissection, vasculitis or significant stenosis. There is calcified atherosclerotic disease throughout the aorta. 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 Otherwise, the celiac artery and its branches appear within normal limits. SMA: Patent without evidence of aneurysm, dissection, vasculitis or significant stenosis. Renals: There is mild-to-moderate focal stenosis of the origin of the left renal artery secondary to atherosclerotic disease. Renal arteries are otherwise within normal limits. IMA: Patent without evidence of aneurysm, dissection, vasculitis or significant stenosis. Inflow: Patent without evidence of aneurysm, dissection, vasculitis or significant stenosis. Calcified atherosclerosis is present. Other: There is moderate focal stenosis in the proximal left superficial femoral artery. There severe focal stenosis and possible focal thrombosis in the proximal right profunda femoral artery. Veins: No obvious venous abnormality within the limitations of this arterial phase study. Review of the MIP images confirms the above findings. NON-VASCULAR Hepatobiliary: Percutaneous drainage catheter has been removed. Ill-defined heterogeneous hypodensity is again seen in the central liver measuring proximally 5.9 x 3.8 cm similar to the prior examination. Rounded hypodensity in the dome of the liver is unchanged and favored as a cyst. Gallbladder is surgically absent. There stable common bile duct dilatation. Pancreas: Unremarkable. No pancreatic ductal dilatation or surrounding inflammatory changes. Spleen: Normal in size without focal abnormality. Adrenals/Urinary Tract: There some bilateral adrenal thickening  which is new from prior. There is no hydronephrosis or perinephric stranding. There is a cyst in the superior pole the right kidney measuring 1.7 cm. Bladder is within normal limits. Stomach/Bowel: There is no evidence for bowel obstruction, pneumatosis or free air. There is diffuse colonic diverticulosis. There is a large amount of stool throughout the colon. The appendix is not seen. There is wall thickening of the mid sigmoid colon without surrounding inflammatory stranding. There is a new collection containing presumed fecal matter and air in the left lower quadrant measuring 6.8 x 6.2 by 6.9 cm. This is inferior to and abutting the sigmoid colon without surrounding inflammation. Small bowel and stomach are within normal limits. Lymphatic: No enlarged lymph nodes are identified. Reproductive: Small calcified fibroids are present. Adnexa are within normal limits. Other: No abdominal wall hernia or abnormality. No abdominopelvic ascites. Musculoskeletal: No acute osseous abnormality. There is soft tissue gas and edema within the abductor thigh musculature and anterior thigh musculature. Review of the MIP images confirms the above findings. IMPRESSION: VASCULAR 1. No evidence for aortic dissection or aneurysm. 2. 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 3. Moderate focal stenosis in the proximal left superficial femoral artery. 4. Severe focal stenosis and possible focal thrombosis in the proximal right profunda femoral artery. Aortic Atherosclerosis (ICD10-I70.0). NON_VASCULAR 1. New 6.9 cm collection containing presumed fecal matter and air in the left lower quadrant abutting the sigmoid colon. Findings may represent a contained perforation of diverticulum with abscess formation. Please correlate clinically. 2. Wall thickening of the mid sigmoid colon without surrounding inflammatory stranding. Findings may be related to colitis/diverticulitis. Underlying lesion can not be excluded. 3. Large  amount of stool throughout the colon. 4. Soft tissue gas and edema within the left abductor thigh musculature and anterior thigh musculature worrisome for infection/myositis/fasciitis including necrotizing fasciitis. 5. Stable ill-defined hypodensity in the central liver favored as abscess. 6. New bilateral adrenal thickening. These results were called by telephone at  the time of interpretation on 10/24/2023 at 1:38 am to provider [facility] , who verbally acknowledged these results. Electronically Signed   By: Darliss Cheney M.D.   On: 10/24/2023 01:41   DG Chest Port 1 View Result Date: 10/23/2023 CLINICAL DATA:  Questionable sepsis EXAM: PORTABLE CHEST 1 VIEW COMPARISON:  Chest x-ray 09/14/2023 FINDINGS: The heart size and mediastinal contours are within normal limits. Both lungs are clear. The visualized skeletal structures are unremarkable. IMPRESSION: No active disease. Electronically Signed   By: Darliss Cheney M.D.   On: 10/23/2023 23:49    I have personally reviewed the relevant CT imaging and labwork  Pt's vitals are stable and there are no signs of end organ failure  A/P: Marquasha Brutus is an 73 y.o. female with LLQ abscess for apparent diverticulitis that is tracking into her L thigh muscle compartments.  Given the prolonged timing of her symptoms, the lack of external lesions and the lack of fluid and gas outside the muscle compartments, this does not appear to be necrotizing fascitis.  There are no fluid collection in her thigh and therefore, I would treat with antibiotics and close monitoring.  Will ask IR to drain abdominal abscess, as this seems to the source of her problem.  Cont aggressive fluid resuscitation.  NPO for now   I spent a total of 84 minutes in both face-to-face and non-face-to-face activities, excluding procedures performed, for this visit on the date of this encounter.  High medical decision making.   Vanita Panda, MD  Colorectal and General  Surgery [facility]

## 2023-10-24 NOTE — Anesthesia Procedure Notes (Signed)
Procedure Name: Intubation Date/Time: 10/24/2023 8:55 PM  Performed by: Lannie Fields, DOPre-anesthesia Checklist: Patient identified, Emergency Drugs available, Suction available and Patient being monitored Patient Re-evaluated:Patient Re-evaluated prior to induction Oxygen Delivery Method: Circle system utilized Preoxygenation: Pre-oxygenation with 100% oxygen Induction Type: IV induction Ventilation: Mask ventilation without difficulty Laryngoscope Size: Mac and 3 Grade View: Grade II Tube type: Oral Tube size: 7.0 mm Number of attempts: 1 Airway Equipment and Method: Stylet and Oral airway Placement Confirmation: ETT inserted through vocal cords under direct vision, positive ETCO2 and breath sounds checked- equal and bilateral Secured at: 23 cm Tube secured with: Tape Dental Injury: Teeth and Oropharynx as per pre-operative assessment

## 2023-10-24 NOTE — ED Provider Notes (Signed)
[place] AT [facility] Provider Note   CSN: [id number] Arrival date & time: 10/23/23  2304     History  Chief Complaint  Patient presents with   Leg Pain    Ashley Galloway is a 73 y.o. female.  The history is provided by the patient. The history is limited by the condition of the patient.  Leg Pain Location:  Leg Time since incident: days. Injury: no   Leg location:  L upper leg Pain details:    Quality:  Aching   Radiates to:  Does not radiate   Severity:  Moderate   Onset quality:  Gradual   Timing:  Constant Chronicity:  New Relieved by:  Nothing Worsened by:  Nothing Ineffective treatments:  None tried Associated symptoms: fatigue   Associated symptoms: no fever   Associated symptoms comment:  Global weakness  Risk factors: recent illness   Patient with h/o TIA and liver abscess drained earlier in the week who reports being on antibiotics but does not know name presents with leg pain, SOB and fatigue.      Past Medical History:  Diagnosis Date   Centrilobular emphysema (HCC)    Colon polyps    Looks like saw Dr. Marca Ancona in 2021   Diverticulitis    HLD (hyperlipidemia)    TIA (transient ischemic attack)    Vesicointestinal fistula      Home Medications Prior to Admission medications   Medication Sig Start Date End Date Taking? Authorizing Provider  aspirin EC 81 MG tablet Take 81 mg by mouth daily. Swallow whole.    [provider]  KLOR-CON M20 20 MEQ tablet Take 20 mEq by mouth daily.    [provider]  latanoprost (XALATAN) 0.005 % ophthalmic solution Place 1 drop into both eyes at bedtime.    [provider]  lovastatin (MEVACOR) 20 MG tablet Take 20 mg by mouth daily.    [provider]  Multiple Vitamins-Minerals (CENTRUM SILVER WOMEN 50+) TABS Take 1 tablet by mouth daily.    [provider]  ondansetron (ZOFRAN-ODT) 4 MG disintegrating tablet Take 1 tablet  (4 mg total) by mouth every 8 (eight) hours as needed for nausea or vomiting. 10/19/23   Kuppelweiser, Cassie L, RPH-CPP  timolol (TIMOPTIC) 0.5 % ophthalmic solution Place 1 drop into both eyes 2 (two) times daily. 07/08/23   [provider]  traZODone (DESYREL) 50 MG tablet Take 1-2 tablets by mouth at bedtime. 04/23/23   [provider]  triamterene-hydrochlorothiazide (DYAZIDE) 37.5-25 MG capsule Take 1 capsule by mouth every morning.    [provider]      Allergies    Tape    Review of Systems   Review of Systems  Unable to perform ROS: Acuity of condition  Constitutional:  Positive for fatigue. Negative for fever.  Respiratory:  Positive for shortness of breath.   Musculoskeletal:  Positive for arthralgias.  Neurological:  Negative for headaches.    Physical Exam Updated Vital Signs BP (!) 103/52   Pulse (!) 102   Temp 97.8 F (36.6 C) (Oral)   Resp (!) 21   Wt 71.3 kg   SpO2 100%   BMI 24.62 kg/m  Physical Exam Vitals and nursing note reviewed. Exam conducted with a chaperone present.  Constitutional:      General: She is not in acute distress.    Appearance: Normal appearance. She is well-developed.  HENT:     Head: Normocephalic and atraumatic.  Nose: Nose normal.  Eyes:     Pupils: Pupils are equal, round, and reactive to light.  Cardiovascular:     Rate and Rhythm: Regular rhythm. Tachycardia present.     Pulses: Normal pulses.     Heart sounds: Normal heart sounds.  Pulmonary:     Effort: Pulmonary effort is normal. No respiratory distress.     Breath sounds: Normal breath sounds.  Abdominal:     General: Bowel sounds are normal. There is no distension.     Palpations: Abdomen is soft.     Tenderness: There is no abdominal tenderness. There is no guarding or rebound.  Musculoskeletal:        General: Normal range of motion.     Cervical back: Neck supple.  Skin:    General: Skin is warm and dry.     Capillary Refill:  Capillary refill takes less than 2 seconds.     Findings: No erythema or rash.       Neurological:     General: No focal deficit present.     Mental Status: She is alert and oriented to person, place, and time.     Deep Tendon Reflexes: Reflexes normal.  Psychiatric:        Mood and Affect: Mood normal.     ED Results / Procedures / Treatments   Labs (all labs ordered are listed, but only abnormal results are displayed) Results for orders placed or performed during the hospital encounter of 10/23/23  Resp panel by RT-PCR (RSV, Flu A&B, Covid) Anterior Nasal Swab   Collection Time: 10/23/23 11:37 PM   Specimen: Anterior Nasal Swab  Result Value Ref Range   SARS Coronavirus 2 by RT PCR NEGATIVE NEGATIVE   Influenza A by PCR NEGATIVE NEGATIVE   Influenza B by PCR NEGATIVE NEGATIVE   Resp Syncytial Virus by PCR NEGATIVE NEGATIVE  Comprehensive metabolic panel   Collection Time: 10/23/23 11:37 PM  Result Value Ref Range   Sodium 131 (L) 135 - 145 mmol/L   Potassium 5.2 (H) 3.5 - 5.1 mmol/L   Chloride 96 (L) 98 - 111 mmol/L   CO2 19 (L) 22 - 32 mmol/L   Glucose, Bld 181 (H) 70 - 99 mg/dL   BUN 59 (H) 8 - 23 mg/dL   Creatinine, Ser 1.47 (H) 0.44 - 1.00 mg/dL   Calcium 9.6 8.9 - 82.9 mg/dL   Total Protein 8.0 6.5 - 8.1 g/dL   Albumin 2.2 (L) 3.5 - 5.0 g/dL   AST 64 (H) 15 - 41 U/L   ALT 36 0 - 44 U/L   Alkaline Phosphatase 119 38 - 126 U/L   Total Bilirubin 1.3 (H) 0.0 - 1.2 mg/dL   GFR, Estimated 42 (L) >60 mL/min   Anion gap 16 (H) 5 - 15  CBC with Differential   Collection Time: 10/23/23 11:37 PM  Result Value Ref Range   WBC 17.7 (H) 4.0 - 10.5 K/uL   RBC 5.31 (H) 3.87 - 5.11 MIL/uL   Hemoglobin 14.8 12.0 - 15.0 g/dL   HCT 56.2 13.0 - 86.5 %   MCV 86.4 80.0 - 100.0 fL   MCH 27.9 26.0 - 34.0 pg   MCHC 32.2 30.0 - 36.0 g/dL   RDW 78.4 69.6 - 29.5 %   Platelets 606 (H) 150 - 400 K/uL   nRBC 0.0 0.0 - 0.2 %   Neutrophils Relative % 60 %   Lymphocytes Relative 10 %    Monocytes Relative 3 %  Eosinophils Relative 0 %   Basophils Relative 0 %   Band Neutrophils 22 %   Immature Granulocytes 0 %   Metamyelocytes Relative 3 %   Myelocytes 2 %   Promyelocytes Relative 0 %   Blasts 0 %   nRBC 0 0 /100 WBC   Other 0 %   Neutro Abs 14.5 (H) 1.7 - 7.7 K/uL   Lymphs Abs 1.8 0.7 - 4.0 K/uL   Monocytes Absolute 0.5 0.1 - 1.0 K/uL   Eosinophils Absolute 0.0 0.0 - 0.5 K/uL   Basophils Absolute 0.0 0.0 - 0.1 K/uL   Abs Immature Granulocytes 0.89 (H) 0.00 - 0.07 K/uL   WBC Morphology INCREASED BANDS (>20% BANDS)   Protime-INR   Collection Time: 10/23/23 11:37 PM  Result Value Ref Range   Prothrombin Time 18.9 (H) 11.4 - 15.2 seconds   INR 1.6 (H) 0.8 - 1.2  APTT   Collection Time: 10/23/23 11:37 PM  Result Value Ref Range   aPTT 24 24 - 36 seconds  Troponin I (High Sensitivity)   Collection Time: 10/23/23 11:37 PM  Result Value Ref Range   Troponin I (High Sensitivity) 27 (H) <18 ng/L  I-Stat Lactic Acid, ED   Collection Time: 10/23/23 11:47 PM  Result Value Ref Range   Lactic Acid, Venous 6.0 (HH) 0.5 - 1.9 mmol/L  I-Stat Lactic Acid, ED   Collection Time: 10/24/23  1:58 AM  Result Value Ref Range   Lactic Acid, Venous 6.7 (HH) 0.5 - 1.9 mmol/L   Comment NOTIFIED PHYSICIAN    CT Angio Chest/Abd/Pel for Dissection W and/or Wo Contrast Result Date: 10/24/2023 CLINICAL DATA:  Septic arterial embolism. Left leg pain and swelling. History of liver abscess drained on 08/20/2024. EXAM: CT ANGIOGRAPHY CHEST, ABDOMEN AND PELVIS TECHNIQUE: Non-contrast CT of the chest was initially obtained. Multidetector CT imaging through the chest, abdomen and pelvis was performed using the standard protocol during bolus administration of intravenous contrast. Multiplanar reconstructed images and MIPs were obtained and reviewed to evaluate the vascular anatomy. RADIATION DOSE REDUCTION: This exam was performed according to the departmental dose-optimization program which  includes automated exposure control, adjustment of the mA and/or kV according to patient size and/or use of iterative reconstruction technique. CONTRAST:  OMNIPAQUE IOHEXOL 350 MG/ML SOLN COMPARISON:  CT abdomen 10/21/2023. CT abdomen and pelvis 09/14/2023. FINDINGS: CTA CHEST FINDINGS Cardiovascular: Preferential opacification of the thoracic aorta. No evidence of thoracic aortic aneurysm or dissection. Normal heart size. No pericardial effusion. There is calcified atherosclerotic disease throughout the aorta. Bovine arch anatomy is noted. Mediastinum/Nodes: No enlarged mediastinal, hilar, or axillary lymph nodes. Thyroid gland, trachea, and esophagus demonstrate no significant findings. Lungs/Pleura: Are minimal atelectatic changes in the bilateral lower lobes and lingula. There is no pleural effusion or pneumothorax. Musculoskeletal: No chest wall abnormality. No acute or significant osseous findings. Review of the MIP images confirms the above findings. CTA ABDOMEN AND PELVIS FINDINGS VASCULAR Aorta: Normal caliber aorta without aneurysm, dissection, vasculitis or significant stenosis. There is calcified atherosclerotic disease throughout the aorta. 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 Otherwise, the celiac artery and its branches appear within normal limits. SMA: Patent without evidence of aneurysm, dissection, vasculitis or significant stenosis. Renals: There is mild-to-moderate focal stenosis of the origin of the left renal artery secondary to atherosclerotic disease. Renal arteries are otherwise within normal limits. IMA: Patent without evidence of aneurysm, dissection, vasculitis or significant stenosis. Inflow: Patent without  evidence of aneurysm, dissection, vasculitis or significant stenosis. Calcified atherosclerosis is present. Other: There is moderate focal stenosis in the proximal left superficial femoral artery. There severe focal  stenosis and possible focal thrombosis in the proximal right profunda femoral artery. Veins: No obvious venous abnormality within the limitations of this arterial phase study. Review of the MIP images confirms the above findings. NON-VASCULAR Hepatobiliary: Percutaneous drainage catheter has been removed. Ill-defined heterogeneous hypodensity is again seen in the central liver measuring proximally 5.9 x 3.8 cm similar to the prior examination. Rounded hypodensity in the dome of the liver is unchanged and favored as a cyst. Gallbladder is surgically absent. There stable common bile duct dilatation. Pancreas: Unremarkable. No pancreatic ductal dilatation or surrounding inflammatory changes. Spleen: Normal in size without focal abnormality. Adrenals/Urinary Tract: There some bilateral adrenal thickening which is new from prior. There is no hydronephrosis or perinephric stranding. There is a cyst in the superior pole the right kidney measuring 1.7 cm. Bladder is within normal limits. Stomach/Bowel: There is no evidence for bowel obstruction, pneumatosis or free air. There is diffuse colonic diverticulosis. There is a large amount of stool throughout the colon. The appendix is not seen. There is wall thickening of the mid sigmoid colon without surrounding inflammatory stranding. There is a new collection containing presumed fecal matter and air in the left lower quadrant measuring 6.8 x 6.2 by 6.9 cm. This is inferior to and abutting the sigmoid colon without surrounding inflammation. Small bowel and stomach are within normal limits. Lymphatic: No enlarged lymph nodes are identified. Reproductive: Small calcified fibroids are present. Adnexa are within normal limits. Other: No abdominal wall hernia or abnormality. No abdominopelvic ascites. Musculoskeletal: No acute osseous abnormality. There is soft tissue gas and edema within the abductor thigh musculature and anterior thigh musculature. Review of the MIP images  confirms the above findings. IMPRESSION: VASCULAR 1. No evidence for aortic dissection or aneurysm. 2. 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 3. Moderate focal stenosis in the proximal left superficial femoral artery. 4. Severe focal stenosis and possible focal thrombosis in the proximal right profunda femoral artery. Aortic Atherosclerosis (ICD10-I70.0). NON_VASCULAR 1. New 6.9 cm collection containing presumed fecal matter and air in the left lower quadrant abutting the sigmoid colon. Findings may represent a contained perforation of diverticulum with abscess formation. Please correlate clinically. 2. Wall thickening of the mid sigmoid colon without surrounding inflammatory stranding. Findings may be related to colitis/diverticulitis. Underlying lesion can not be excluded. 3. Large amount of stool throughout the colon. 4. Soft tissue gas and edema within the left abductor thigh musculature and anterior thigh musculature worrisome for infection/myositis/fasciitis including necrotizing fasciitis. 5. Stable ill-defined hypodensity in the central liver favored as abscess. 6. New bilateral adrenal thickening. These results were called by telephone at the time of interpretation on 10/24/2023 at 1:38 am to provider [facility] , who verbally acknowledged these results. Electronically Signed   By: Darliss Cheney M.D.   On: 10/24/2023 01:41   DG Chest Port 1 View Result Date: 10/23/2023 CLINICAL DATA:  Questionable sepsis EXAM: PORTABLE CHEST 1 VIEW COMPARISON:  Chest x-ray 09/14/2023 FINDINGS: The heart size and mediastinal contours are within normal limits. Both lungs are clear. The visualized skeletal structures are unremarkable. IMPRESSION: No active disease. Electronically Signed   By: Darliss Cheney M.D.   On: 10/23/2023 23:49   CT ABDOMEN W CONTRAST Result Date: 10/21/2023 CLINICAL DATA:  73 year old female with history of hepatic abscess status post percutaneous drain  placement on  09/16/2023 presenting for initial drain follow-up. EXAM: CT ABDOMEN WITH CONTRAST TECHNIQUE: Multidetector CT imaging of the abdomen was performed using the standard protocol following bolus administration of intravenous contrast. RADIATION DOSE REDUCTION: This exam was performed according to the departmental dose-optimization program which includes automated exposure control, adjustment of the mA and/or kV according to patient size and/or use of iterative reconstruction technique. CONTRAST:  75mL OMNIPAQUE IOHEXOL 350 MG/ML SOLN COMPARISON:  09/14/2023, 09/15/2023 FINDINGS: Lower chest: No acute abnormality. Hepatobiliary: Significantly decreased size of previously visualized right lobe hepatic abscess, now with ill-defined borders measuring approximately 4.9 x 5.0 x 3.4 cm (AP by trans by CC, previously measuring up to approximately 12.5 cm. Percutaneous hepatic fluid collection pigtail drainage catheter in unchanged position. Unchanged right hepatic dome subcentimeter simple hepatic cyst. The liver is otherwise within normal limits. Postsurgical changes after cholecystectomy. Mild compensatory extrahepatic biliary ductal dilation. Pancreas: Unremarkable. No pancreatic ductal dilatation or surrounding inflammatory changes. Spleen: Normal in size without focal abnormality. Adrenals/Urinary Tract: Adrenal glands are unremarkable. Unchanged simple cyst in the right kidney, not requiring additional follow-up. Kidneys are normal, without renal calculi, focal lesion, or hydronephrosis. Bladder is unremarkable. Stomach/Bowel: Stomach is within normal limits. Scattered colonic diverticula, most prominent in the visualized proximal descending colon. Evidence of bowel wall thickening, distention, or inflammatory changes. Vascular/Lymphatic: Aortic atherosclerosis. No enlarged abdominal or pelvic lymph nodes. Other: Similar appearing small, uncomplicated umbilical hernia. Musculoskeletal: No acute osseous abnormality.  Similar appearing multilevel degenerative changes of the visualized thoracolumbar spine. IMPRESSION: 1. Significantly decreased size and decreased conspicuity of previously visualized right hepatic abscess with unchanged position of indwelling percutaneous drainage catheter. 2. Diverticulosis. 3. Aortic atherosclerosis. Aortic Atherosclerosis (ICD10-I70.0). Marliss Coots, MD [facility] Radiology Electronically Signed   By: Marliss Coots M.D.   On: 10/21/2023 21:54   ECHOCARDIOGRAM COMPLETE Result Date: 10/05/2023    ECHOCARDIOGRAM REPORT   Patient Name:   Ashley Galloway Date of Exam: 10/05/2023 Medical Rec #:  [id number]                 Height:       67.0 in Accession #:    [id number]                Weight:       186.1 lb Date of Birth:  April 09, 1951                 BSA:          1.961 m Patient Age:    72 years                  BP:           127/78 mmHg Patient Gender: F                         HR:           56 bpm. Exam Location:  [address] Procedure: 2D Echo, Cardiac Doppler and Color Doppler Indications:    R78.81 Bacteremia  History:        Patient has no prior history of Echocardiogram examinations.                 Bacteremia; Risk Factors:Hypertension, Dyslipidemia and Current                 Smoker.  Sonographer:    Samule Ohm RDCS Referring Phys: [id number] RONALD POLITE IMPRESSIONS  1. Left ventricular ejection fraction, by estimation, is 60 to 65%. The left ventricle has normal function. The left ventricle has no regional wall motion abnormalities. There is mild concentric left ventricular hypertrophy. Left ventricular diastolic parameters are consistent with Grade I diastolic dysfunction (impaired relaxation).  2. Right ventricular systolic function is normal. The right ventricular size is normal.  3. Left atrial size was mildly dilated.  4. The mitral valve is normal in structure. Trivial mitral valve regurgitation. No evidence of mitral  stenosis.  5. The aortic valve is tricuspid. There is mild calcification of the aortic valve. Aortic valve regurgitation is mild. Aortic valve sclerosis/calcification is present, without any evidence of aortic stenosis.  6. The inferior vena cava is normal in size with greater than 50% respiratory variability, suggesting right atrial pressure of 3 mmHg. FINDINGS  Left Ventricle: Left ventricular ejection fraction, by estimation, is 60 to 65%. The left ventricle has normal function. The left ventricle has no regional wall motion abnormalities. The left ventricular internal cavity size was normal in size. There is  mild concentric left ventricular hypertrophy. Left ventricular diastolic parameters are consistent with Grade I diastolic dysfunction (impaired relaxation). Right Ventricle: The right ventricular size is normal. No increase in right ventricular wall thickness. Right ventricular systolic function is normal. Left Atrium: Left atrial size was mildly dilated. Right Atrium: Right atrial size was normal in size. Pericardium: There is no evidence of pericardial effusion. Mitral Valve: The mitral valve is normal in structure. Trivial mitral valve regurgitation. No evidence of mitral valve stenosis. Tricuspid Valve: The tricuspid valve is normal in structure. Tricuspid valve regurgitation is trivial. No evidence of tricuspid stenosis. Aortic Valve: The aortic valve is tricuspid. There is mild calcification of the aortic valve. Aortic valve regurgitation is mild. Aortic regurgitation PHT measures 477 msec. Aortic valve sclerosis/calcification is present, without any evidence of aortic stenosis. Pulmonic Valve: The pulmonic valve was normal in structure. Pulmonic valve regurgitation is not visualized. No evidence of pulmonic stenosis. Aorta: The aortic root is normal in size and structure. Venous: The inferior vena cava is normal in size with greater than 50% respiratory variability, suggesting right atrial pressure  of 3 mmHg. IAS/Shunts: No atrial level shunt detected by color flow Doppler.  LEFT VENTRICLE PLAX 2D LVIDd:         4.30 cm   Diastology LVIDs:         2.40 cm   LV e' medial:    9.31 cm/s LV PW:         0.90 cm   LV E/e' medial:  10.2 LV IVS:        1.20 cm   LV e' lateral:   10.53 cm/s LVOT diam:     1.80 cm   LV E/e' lateral: 9.0 LV SV:         70 LV SV Index:   36 LVOT Area:     2.54 cm  RIGHT VENTRICLE            IVC RVSP:           36.6 mmHg  IVC diam: 1.30 cm LEFT ATRIUM             Index        RIGHT ATRIUM           Index LA diam:        3.80 cm 1.94 cm/m   RA Pressure: 3.00 mmHg LA Vol (A2C):   28.5 ml 14.53 ml/m  RA Area:     8.26 cm LA Vol (A4C):   53.6 ml 27.33 ml/m  RA Volume:   14.50 ml  7.39 ml/m LA Biplane Vol: 42.3 ml 21.57 ml/m  AORTIC VALVE LVOT Vmax:   140.40 cm/s LVOT Vmean:  79.380 cm/s LVOT VTI:    0.275 m AI PHT:      477 msec  AORTA Ao Root diam: 2.80 cm Ao Asc diam:  3.30 cm MITRAL VALVE               TRICUSPID VALVE MV Area (PHT): 3.03 cm    TR Peak grad:   33.6 mmHg MV Decel Time: 250 msec    TR Vmax:        290.00 cm/s MV E velocity: 95.00 cm/s  Estimated RAP:  3.00 mmHg MV A velocity: 93.20 cm/s  RVSP:           36.6 mmHg MV E/A ratio:  1.02                            SHUNTS                            Systemic VTI:  0.28 m                            Systemic Diam: 1.80 cm Arvilla Meres MD Electronically signed by Arvilla Meres MD Signature Date/Time: 10/05/2023/2:29:12 PM    Final     EKG EKG Interpretation Date/Time:  Friday October 23 2023 23:36:35 EST Ventricular Rate:  99 PR Interval:  116 QRS Duration:  87 QT Interval:  271 QTC Calculation: 348 R Axis:   76  Text Interpretation: Normal sinus rhythm Atrial premature complex Confirmed by Lilton Pare ([id number]) on 10/23/2023 11:39:28 PM  Radiology CT Angio Chest/Abd/Pel for Dissection W and/or Wo Contrast Result Date: 10/24/2023 CLINICAL DATA:  Septic arterial embolism. Left leg pain and swelling. History  of liver abscess drained on 08/20/2024. EXAM: CT ANGIOGRAPHY CHEST, ABDOMEN AND PELVIS TECHNIQUE: Non-contrast CT of the chest was initially obtained. Multidetector CT imaging through the chest, abdomen and pelvis was performed using the standard protocol during bolus administration of intravenous contrast. Multiplanar reconstructed images and MIPs were obtained and reviewed to evaluate the vascular anatomy. RADIATION DOSE REDUCTION: This exam was performed according to the departmental dose-optimization program which includes automated exposure control, adjustment of the mA and/or kV according to patient size and/or use of iterative reconstruction technique. CONTRAST:  OMNIPAQUE IOHEXOL 350 MG/ML SOLN COMPARISON:  CT abdomen 10/21/2023. CT abdomen and pelvis 09/14/2023. FINDINGS: CTA CHEST FINDINGS Cardiovascular: Preferential opacification of the thoracic aorta. No evidence of thoracic aortic aneurysm or dissection. Normal heart size. No pericardial effusion. There is calcified atherosclerotic disease throughout the aorta. Bovine arch anatomy is noted. Mediastinum/Nodes: No enlarged mediastinal, hilar, or axillary lymph nodes. Thyroid gland, trachea, and esophagus demonstrate no significant findings. Lungs/Pleura: Are minimal atelectatic changes in the bilateral lower lobes and lingula. There is no pleural effusion or pneumothorax. Musculoskeletal: No chest wall abnormality. No acute or significant osseous findings. Review of the MIP images confirms the above findings. CTA ABDOMEN AND PELVIS FINDINGS VASCULAR Aorta: Normal caliber aorta without aneurysm, dissection, vasculitis or significant stenosis. There is calcified atherosclerotic disease throughout the aorta. 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 Otherwise, the celiac artery and its branches appear within normal limits. SMA: Patent without evidence of aneurysm, dissection, vasculitis or  significant stenosis. Renals: There is mild-to-moderate focal stenosis of the origin of the left renal artery secondary to atherosclerotic disease. Renal arteries are otherwise within normal limits. IMA: Patent without evidence of aneurysm, dissection, vasculitis or significant stenosis. Inflow: Patent without evidence of aneurysm, dissection, vasculitis or significant stenosis. Calcified atherosclerosis is present. Other: There is moderate focal stenosis in the proximal left superficial femoral artery. There severe focal stenosis and possible focal thrombosis in the proximal right profunda femoral artery. Veins: No obvious venous abnormality within the limitations of this arterial phase study. Review of the MIP images confirms the above findings. NON-VASCULAR Hepatobiliary: Percutaneous drainage catheter has been removed. Ill-defined heterogeneous hypodensity is again seen in the central liver measuring proximally 5.9 x 3.8 cm similar to the prior examination. Rounded hypodensity in the dome of the liver is unchanged and favored as a cyst. Gallbladder is surgically absent. There stable common bile duct dilatation. Pancreas: Unremarkable. No pancreatic ductal dilatation or surrounding inflammatory changes. Spleen: Normal in size without focal abnormality. Adrenals/Urinary Tract: There some bilateral adrenal thickening which is new from prior. There is no hydronephrosis or perinephric stranding. There is a cyst in the superior pole the right kidney measuring 1.7 cm. Bladder is within normal limits. Stomach/Bowel: There is no evidence for bowel obstruction, pneumatosis or free air. There is diffuse colonic diverticulosis. There is a large amount of stool throughout the colon. The appendix is not seen. There is wall thickening of the mid sigmoid colon without surrounding inflammatory stranding. There is a new collection containing presumed fecal matter and air in the left lower quadrant measuring 6.8 x 6.2 by 6.9 cm.  This is inferior to and abutting the sigmoid colon without surrounding inflammation. Small bowel and stomach are within normal limits. Lymphatic: No enlarged lymph nodes are identified. Reproductive: Small calcified fibroids are present. Adnexa are within normal limits. Other: No abdominal wall hernia or abnormality. No abdominopelvic ascites. Musculoskeletal: No acute osseous abnormality. There is soft tissue gas and edema within the abductor thigh musculature and anterior thigh musculature. Review of the MIP images confirms the above findings. IMPRESSION: VASCULAR 1. No evidence for aortic dissection or aneurysm. 2. 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 3. Moderate focal stenosis in the proximal left superficial femoral artery. 4. Severe focal stenosis and possible focal thrombosis in the proximal right profunda femoral artery. Aortic Atherosclerosis (ICD10-I70.0). NON_VASCULAR 1. New 6.9 cm collection containing presumed fecal matter and air in the left lower quadrant abutting the sigmoid colon. Findings may represent a contained perforation of diverticulum with abscess formation. Please correlate clinically. 2. Wall thickening of the mid sigmoid colon without surrounding inflammatory stranding. Findings may be related to colitis/diverticulitis. Underlying lesion can not be excluded. 3. Large amount of stool throughout the colon. 4. Soft tissue gas and edema within the left abductor thigh musculature and anterior thigh musculature worrisome for infection/myositis/fasciitis including necrotizing fasciitis. 5. Stable ill-defined hypodensity in the central liver favored as abscess. 6. New bilateral adrenal thickening. These results were called by telephone at the time of interpretation on 10/24/2023 at 1:38 am to provider [facility] , who verbally acknowledged these results. Electronically Signed   By: Darliss Cheney M.D.   On: 10/24/2023 01:41   DG Chest Port 1 View Result Date:  10/23/2023 CLINICAL DATA:  Questionable sepsis EXAM: PORTABLE CHEST 1 VIEW COMPARISON:  Chest  x-ray 09/14/2023 FINDINGS: The heart size and mediastinal contours are within normal limits. Both lungs are clear. The visualized skeletal structures are unremarkable. IMPRESSION: No active disease. Electronically Signed   By: Darliss Cheney M.D.   On: 10/23/2023 23:49    Procedures .Critical Care  Performed by: Cy Blamer, MD Authorized by: Cy Blamer, MD   Critical care provider statement:    Critical care time (minutes):  90   Critical care end time:  10/24/2023 2:42 AM   Critical care was necessary to treat or prevent imminent or life-threatening deterioration of the following conditions:  Sepsis   Critical care was time spent personally by me on the following activities:  Evaluation of patient's response to treatment, discussions with consultants, examination of patient, ordering and performing treatments and interventions, ordering and review of laboratory studies, ordering and review of radiographic studies, pulse oximetry, re-evaluation of patient's condition and review of old charts   Care discussed with: admitting provider       Medications Ordered in ED Medications  lactated ringers infusion ( Intravenous New Bag/Given 10/23/23 2358)  vancomycin (VANCOCIN) IVPB 1000 mg/200 mL premix (1,000 mg Intravenous New Bag/Given 10/24/23 0127)  ceFEPIme (MAXIPIME) 2 g in sodium chloride 0.9 % 100 mL IVPB (0 g Intravenous Stopped 10/24/23 0035)  metroNIDAZOLE (FLAGYL) IVPB 500 mg (0 mg Intravenous Stopped 10/24/23 0127)  iohexol (OMNIPAQUE) 350 MG/ML injection 100 mL (100 mLs Intravenous Contrast Given 10/24/23 0100)    ED Course/ Medical Decision Making/ A&P                                 Medical Decision Making Patient with recent drainage of liver abscess on unknown antibiotics   Problems Addressed: Necrotizing soft tissue infection:    Details: Surgery consulted immediately   Amount  and/or Complexity of Data Reviewed Independent Historian: spouse    Details: See above  External Data Reviewed: labs, radiology and notes.    Details: Previous admission reviewed  Labs: ordered.    Details: Negative covid and flu, sodium slight low 131, potassium elevated 5.2, elevated creatinine 1.34, no elevation LFTs, elevated troponin 27, elevated white count 17.1, elevated platelets 606, elevated lactate 6   Radiology: ordered and independent interpretation performed.    Details: No PE by me on CTA ECG/medicine tests: ordered and independent interpretation performed.    Details: See muse  Discussion of management or test interpretation with external provider(s): 148 Case d/w Dr. Maisie Fus of surgery, I informed her of abscess near sigmoid colon and necrotizing fasciitis of upper medial left thigh.  Please have medicine admit, surgery will see the patient.    Risk Prescription drug management. Decision regarding hospitalization.    Final Clinical Impression(s) / ED Diagnoses Final diagnoses:  Necrotizing soft tissue infection  Postprocedural intraabdominal abscess (HCC)   The patient appears reasonably stabilized for admission considering the current resources, flow, and capabilities available in the ED at this time, and I doubt any other [facility] requiring further screening and/or treatment in the ED prior to admission.  Rx / DC Orders ED Discharge Orders     None         Sadia Belfiore, MD 10/24/23 1610

## 2023-10-24 NOTE — Anesthesia Procedure Notes (Signed)
Arterial Line Insertion Start/End1/25/2025 8:20 PM, 10/24/2023 8:25 PM Performed by: Lannie Fields, DO, anesthesiologist  Patient location: Pre-op. Preanesthetic checklist: patient identified, IV checked, site marked, risks and benefits discussed, surgical consent, monitors and equipment checked, pre-op evaluation, timeout performed and anesthesia consent Lidocaine 1% used for infiltration Right, radial was placed Catheter size: 20 G Hand hygiene performed  and maximum sterile barriers used   Attempts: 1 Procedure performed without using ultrasound guided technique. Following insertion, dressing applied and Biopatch. Post procedure assessment: normal and unchanged  Patient tolerated the procedure well with no immediate complications.

## 2023-10-24 NOTE — Consult Note (Addendum)
NAME:  Ashley Galloway, MRN:  [id number], DOB:  1951/09/12, LOS: 0 ADMISSION DATE:  10/23/2023, CONSULTATION DATE:  10/24/2023 REFERRING MD: Azucena Fallen, MD, CHIEF COMPLAINT: Septic shock since 4 pm   History of Present Illness:  A 73 y.o. female with HTN, dyslipidemia, PVD, COPD, tobacco smoking (quit few months ago), recent Streptococcus intermedius bacteremia, hepatic abscess (percutaneous drain d/c on 10/20/2022), and diverticulitis in Dec 2024 who completed antibiotics few days ago. She presented to ER 10/24/2023 in am with c/o LLQ swelling and erythema, and left thigh pain for few days. She has fever, chills, general malaise and loss of appetite, but denies chest pain, cough, SOB, wheezing, or N/V/D. She reports constipation.  New CT imaging finds new large LLQ abscess with associated diverticulitis as well as fluid/air into abductor muscle concerning for myositis.  Pertinent  Medical History  HTN, dyslipidemia, PVD, COPD, tobacco smoking (quit few months ago), recent Streptococcus intermedius bacteremia, hepatic abscess (percutaneous drain d/c on 10/20/2022), diverticulitis in Dec 2024  Significant Hospital Events: Including procedures, antibiotic start and stop dates in addition to other pertinent events   Received 4.5 L LR and on her 5th L of LR boluses Started on IV abx: Vancomycin, Cefepime, and Flagyl--->Linezolid, Zosyn and Clindamycin. General surgery consulted and request IR, s/p perc drain to abdominal abscess. Developed Afib in ICU, new   Interim History / Subjective:   Objective   Blood pressure (!) 84/43, pulse 87, temperature 99.5 F (37.5 C), resp. rate 15, height 5\' 5"  (1.651 m), weight 75.5 kg, SpO2 100%.        Intake/Output Summary (Last 24 hours) at 10/24/2023 1926 Last data filed at 10/24/2023 1813 Gross per 24 hour  Intake 5688.89 ml  Output 2150 ml  Net 3538.89 ml   Filed Weights   10/23/23 2328 10/24/23 1415  Weight: 71.3 kg 75.5 kg     Examination: General: alert, oriented x2, and comfortable. On RA. SpO2 99%  HENT: PERL, normal pharynx and oral mucosa. No LNE or thyromegaly. No JVD Lungs: symmetrical air entry bilaterally. No crackles or wheezing Cardiovascular: NL S1/S2. No m/g/r Abdomen: no distension. LLQ tenderness (drain)   Extremities: left thigh crepitus, redness, tenderness, and edema medial aspect. Good peripheral pulses  Neuro: nonfocal    Resolved Hospital Problem list   AKI  Assessment & Plan:  Septic shock due to intra-abdominal abscess/diverticulitis and left thigh necrotizing fascitis.  Lactic acidosis S/p percutaneous drain Complicated by recent strep intermedius bacteremia, hepatic abscess (drain was d/c 10/21/2023), and chronic diverticulosis. Finished PO Abx few days ago -IVF -Serial LA -Zosyn and Clindamycin -d/c Linezolid -I/O chart -General Surgery and IR following -Ortho consult for OP tonight for fasciectomy  -am lab -Levophed for MAP>65 mmHg -check HIV Ab  New Afib due to sepsis 10/05/2023: Echo EF 60%, G1DD -Mg 2 mg IV x1 -Monitor rate  Acute metabolic encephalopathy  -Secondary to above  COPD -Duoneb PRN   HTN and dyslipidemia -Hold home meds  Moderate PCM -Dietitian consult  Mild hypoNa -Monitor Na  Hyperglycemia due to sepsis Pre-DM (HbA1c 6.3%) - ISS - Glycemic control   Elevated INR: recent IV and PO Abx and sepsis now -Fibrogen am   Anxiety: well-controlled  Lumbar stenosis, no current complaints  Peripheral arterial disease, chronic  Best Practice (right click and "Reselect all SmartList Selections" daily)   Diet/type: NPO DVT prophylaxis prophylactic heparin  Pressure ulcer(s): N/A GI prophylaxis: H2B Lines: Central line Foley:  Yes, and it is still needed  Code Status:  full code Last date of multidisciplinary goals of care discussion []   Labs   CBC: Recent Labs  Lab 10/23/23 2337 10/24/23 0410  WBC 17.7* 11.2*  NEUTROABS 14.5*  --    HGB 14.8 12.1  HCT 45.9 37.4  MCV 86.4 87.4  PLT 606* 456*    Basic Metabolic Panel: Recent Labs  Lab 10/23/23 2337 10/24/23 0410  NA 131* 130*  K 5.2* 4.2  CL 96* 98  CO2 19* 20*  GLUCOSE 181* 140*  BUN 59* 51*  CREATININE 1.34* 0.98  CALCIUM 9.6 8.4*   GFR: Estimated Creatinine Clearance: 52.8 mL/min (by C-G formula based on SCr of 0.98 mg/dL). Recent Labs  Lab 10/23/23 2337 10/23/23 2347 10/24/23 0158 10/24/23 0410 10/24/23 1323  WBC 17.7*  --   --  11.2*  --   LATICACIDVEN  --  6.0* 6.7* 5.9* 3.7*    Liver Function Tests: Recent Labs  Lab 10/23/23 2337 10/24/23 0410  AST 64* 51*  ALT 36 30  ALKPHOS 119 86  BILITOT 1.3* 0.7  PROT 8.0 5.8*  ALBUMIN 2.2* 1.6*   No results for input(s): "LIPASE", "AMYLASE" in the last 168 hours. No results for input(s): "AMMONIA" in the last 168 hours.  ABG No results found for: "PHART", "PCO2ART", "PO2ART", "HCO3", "TCO2", "ACIDBASEDEF", "O2SAT"   Coagulation Profile: Recent Labs  Lab 10/23/23 2337  INR 1.6*    Cardiac Enzymes: No results for input(s): "CKTOTAL", "CKMB", "CKMBINDEX", "TROPONINI" in the last 168 hours.  HbA1C: Hgb A1c MFr Bld  Date/Time Value Ref Range Status  10/24/2023 04:10 AM 6.3 (H) 4.8 - 5.6 % Final    Comment:    (NOTE) Pre diabetes:          5.7%-6.4%  Diabetes:              >6.4%  Glycemic control for   <7.0% adults with diabetes     CBG: Recent Labs  Lab 10/24/23 0409 10/24/23 0754  GLUCAP 118* 115*    Review of Systems:   AMS  Past Medical History:  She,  has a past medical history of Centrilobular emphysema (HCC), Colon polyps, Diverticulitis, HLD (hyperlipidemia), TIA (transient ischemic attack), and Vesicointestinal fistula.   Surgical History:   Past Surgical History:  Procedure Laterality Date   CHOLECYSTECTOMY     IR GUIDED DRAIN W CATHETER PLACEMENT  09/16/2023   PARTIAL COLECTOMY     Tubulovillous adenoma   SPINE SURGERY       Social History:    reports that she has been smoking cigarettes. She has been exposed to tobacco smoke. She has never used smokeless tobacco.   Family History:  Her family history is negative for Breast cancer.   Allergies Allergies  Allergen Reactions   Tape Other (See Comments)    Tape from hormone patch.     Home Medications  Prior to Admission medications   Medication Sig Start Date End Date Taking? Authorizing Provider  aspirin EC 81 MG tablet Take 81 mg by mouth daily. Swallow whole.   Yes [provider]  cefadroxil (DURICEF) 500 MG capsule Take 1,000 mg by mouth 2 (two) times daily.   Yes [provider]  KLOR-CON M20 20 MEQ tablet Take 20 mEq by mouth daily.   Yes [provider]  latanoprost (XALATAN) 0.005 % ophthalmic solution Place 1 drop into both eyes at bedtime.   Yes [provider]  lovastatin (MEVACOR) 20 MG tablet Take 20 mg by mouth  daily.   Yes [provider]  methocarbamol (ROBAXIN) 500 MG tablet Take 500 mg by mouth daily as needed for muscle spasms. 10/18/23  Yes [provider]  Multiple Vitamins-Minerals (CENTRUM SILVER WOMEN 50+) TABS Take 1 tablet by mouth daily.   Yes [provider]  ondansetron (ZOFRAN-ODT) 4 MG disintegrating tablet Take 1 tablet (4 mg total) by mouth every 8 (eight) hours as needed for nausea or vomiting. 10/19/23  Yes Kuppelweiser, Cassie L, RPH-CPP  Probiotic Product (PROBIOTIC PO) Take 1 capsule by mouth daily.   Yes [provider]  traMADol (ULTRAM) 50 MG tablet Take 50 mg by mouth 2 (two) times daily as needed for moderate pain (pain score 4-6) or severe pain (pain score 7-10). 10/22/23  Yes [provider]  triamterene-hydrochlorothiazide (DYAZIDE) 37.5-25 MG capsule Take 1 capsule by mouth every morning.   Yes [provider]  ALPRAZolam (XANAX) 0.25 MG tablet Take 0.25 mg by mouth daily. Patient not taking: Reported on 10/24/2023 10/22/23   [provider]   cefTRIAXone (ROCEPHIN) 2 g injection Inject 2 g into the muscle daily. For 5 days starting 10/05/23 Patient not taking: Reported on 10/24/2023    [provider]  HYDROcodone-acetaminophen (NORCO/VICODIN) 5-325 MG tablet Take 1 tablet by mouth every 12 (twelve) hours. Patient not taking: Reported on 10/24/2023 10/23/23   [provider]  predniSONE (STERAPRED UNI-PAK 21 TAB) 10 MG (21) TBPK tablet Take 10 mg by mouth as directed. Patient not taking: Reported on 10/24/2023 10/22/23   [provider]  timolol (TIMOPTIC) 0.5 % ophthalmic solution Place 1 drop into both eyes 2 (two) times daily. Patient not taking: Reported on 10/24/2023 07/08/23   [provider]  traZODone (DESYREL) 50 MG tablet Take 1-2 tablets by mouth at bedtime. Patient not taking: Reported on 10/24/2023 04/23/23   [provider]     Critical care time: 60 min

## 2023-10-24 NOTE — ED Notes (Signed)
Pt unable to urinate/ verbal order by Dr. Nicanor Alcon given for temp foley

## 2023-10-24 NOTE — Anesthesia Preprocedure Evaluation (Addendum)
Anesthesia Evaluation  Patient identified by MRN, date of birth, ID band Patient awake    Reviewed: Allergy & Precautions, H&P , NPO status , Patient's Chart, lab work & pertinent test results  Airway Mallampati: II  TM Distance: >3 FB Neck ROM: Full    Dental   Pulmonary COPD, Current Smoker   Pulmonary exam normal breath sounds clear to auscultation       Cardiovascular Normal cardiovascular exam+ Valvular Problems/Murmurs (mild AI) AI  Rhythm:Regular Rate:Normal   1. Left ventricular ejection fraction, by estimation, is 60 to 65%. The  left ventricle has normal function. The left ventricle has no regional  wall motion abnormalities. There is mild concentric left ventricular  hypertrophy. Left ventricular diastolic  parameters are consistent with Grade I diastolic dysfunction (impaired  relaxation).   2. Right ventricular systolic function is normal. The right ventricular  size is normal.   3. Left atrial size was mildly dilated.   4. The mitral valve is normal in structure. Trivial mitral valve  regurgitation. No evidence of mitral stenosis.   5. The aortic valve is tricuspid. There is mild calcification of the  aortic valve. Aortic valve regurgitation is mild. Aortic valve  sclerosis/calcification is present, without any evidence of aortic  stenosis.   6. The inferior vena cava is normal in size with greater than 50%  respiratory variability, suggesting right atrial pressure of 3 mmHg.      Neuro/Psych  PSYCHIATRIC DISORDERS Anxiety     TIA   GI/Hepatic Neg liver ROS,,, recent Streptococcus intermedius bacteremia, hepatic abscess, and diverticulitis who completed antibiotics, patient had hepatic abscess percutaneous drain removed by ID on 10/20/2022 given improvement in output at the time.  Patient presents to the hospital with worsening left thigh pain swelling and blanching erythema.  Initial imaging shows a large left  lower quadrant fluid-filled collection concerning for abscess with possible involvement of the anterior compartment of the left leg concerning for myositis.     Endo/Other  negative endocrine ROS    Renal/GU negative Renal ROS  negative genitourinary   Musculoskeletal Sepsis 2/2 LLE necrotizing fasciitis    Abdominal   Peds negative pediatric ROS (+)  Hematology negative hematology ROS (+) Hb 12, plt 456   Anesthesia Other Findings   Reproductive/Obstetrics negative OB ROS                             Anesthesia Physical Anesthesia Plan  ASA: 4 and emergent  Anesthesia Plan: General   Post-op Pain Management: Ofirmev IV (intra-op)*   Induction: Intravenous  PONV Risk Score and Plan: 2 and Ondansetron, Treatment may vary due to age or medical condition and Midazolam  Airway Management Planned: Oral ETT  Additional Equipment: Arterial line and Ultrasound Guidance Line Placement  Intra-op Plan:   Post-operative Plan: Extubation in OR and Possible Post-op intubation/ventilation  Informed Consent: I have reviewed the patients History and Physical, chart, labs and discussed the procedure including the risks, benefits and alternatives for the proposed anesthesia with the patient or authorized representative who has indicated his/her understanding and acceptance.     Dental advisory given  Plan Discussed with: CRNA  Anesthesia Plan Comments: (Currently septic and unstable from LLE necrotizing fasciitis on levophed but only with peripheral IV access, will need arterial line and central venous access. SBPs 80, HR 130-140bpm)         Anesthesia Quick Evaluation

## 2023-10-24 NOTE — H&P (Signed)
History and Physical    Brodi Nery WUJ:[id number] DOB: [birth year] DOA: 10/23/2023  PCP: Renford Dills, MD   Patient coming from: Home   Chief Complaint: Severe left thigh pain and swelling   HPI: Ashley Galloway is a 73 y.o. female with recent Streptococcus intermedius bacteremia, hepatic abscess, and diverticulitis who completed antibiotics, had her percutaneous drain removed by ID on 10/20/2022, and now presents with worsening left thigh pain and swelling.  Patient reports that she had some left thigh discomfort little over a week ago.  She went on to develop chills and increasing pain, swelling, and redness involving the anteromedial left thigh.  She has had a general malaise and loss of appetite associated with this but denies chest pain, cough, diarrhea, or vomiting.  She reports constipation.  ED Course: Upon arrival to the ED, patient is found to be afebrile and saturating well on room air with mild tachypnea, tachycardia, and stable blood pressure.  Labs are most notable for lactic acid 6.0, WBC 17,700, platelets 606,000, creatinine 1.34, and albumin 2.2.  CT is concerning for soft tissue gas and edema within the left thigh musculature, 6.9 cm left lower quadrant collection abutting the sigmoid colon, and severe focal stenosis and possible thrombus in proximal right femoral profunda artery.  Blood cultures were collected, surgery (Dr. Maisie Fus) was consulted by the ED physician, and the patient was treated with vancomycin, cefepime, and Flagyl.  Review of Systems:  All other systems reviewed and apart from HPI, are negative.  Past Medical History:  Diagnosis Date   Centrilobular emphysema (HCC)    Colon polyps    Looks like saw Dr. Marca Ancona in 2021   Diverticulitis    HLD (hyperlipidemia)    TIA (transient ischemic attack)    Vesicointestinal fistula     Past Surgical History:  Procedure Laterality Date   CHOLECYSTECTOMY     IR GUIDED DRAIN W CATHETER  PLACEMENT  09/16/2023   PARTIAL COLECTOMY     Tubulovillous adenoma   SPINE SURGERY      Social History:   reports that she has been smoking cigarettes. She has been exposed to tobacco smoke. She has never used smokeless tobacco. No history on file for alcohol use and drug use.  Allergies  Allergen Reactions   Tape     Tape from hormone patch.    Family History  Problem Relation Age of Onset   Breast cancer Neg Hx      Prior to Admission medications   Medication Sig Start Date End Date Taking? Authorizing Provider  ALPRAZolam (XANAX) 0.25 MG tablet Take 0.25 mg by mouth daily. 10/22/23  Yes [provider]  HYDROcodone-acetaminophen (NORCO/VICODIN) 5-325 MG tablet Take 1 tablet by mouth every 12 (twelve) hours. 10/23/23  Yes [provider]  methocarbamol (ROBAXIN) 500 MG tablet Take 500 mg by mouth 3 (three) times daily as needed. 10/18/23  Yes [provider]  predniSONE (STERAPRED UNI-PAK 21 TAB) 10 MG (21) TBPK tablet Take 10 mg by mouth as directed. 10/22/23  Yes [provider]  traMADol (ULTRAM) 50 MG tablet Take 50 mg by mouth 2 (two) times daily as needed. 10/22/23  Yes [provider]  aspirin EC 81 MG tablet Take 81 mg by mouth daily. Swallow whole.    [provider]  KLOR-CON M20 20 MEQ tablet Take 20 mEq by mouth daily.    [provider]  latanoprost (XALATAN) 0.005 % ophthalmic solution Place 1 drop into both eyes at  bedtime.    [provider]  lovastatin (MEVACOR) 20 MG tablet Take 20 mg by mouth daily.    [provider]  Multiple Vitamins-Minerals (CENTRUM SILVER WOMEN 50+) TABS Take 1 tablet by mouth daily.    [provider]  ondansetron (ZOFRAN-ODT) 4 MG disintegrating tablet Take 1 tablet (4 mg total) by mouth every 8 (eight) hours as needed for nausea or vomiting. 10/19/23   Kuppelweiser, Cassie L, RPH-CPP  timolol (TIMOPTIC) 0.5 % ophthalmic solution Place 1 drop into both  eyes 2 (two) times daily. 07/08/23   [provider]  traZODone (DESYREL) 50 MG tablet Take 1-2 tablets by mouth at bedtime. 04/23/23   [provider]  triamterene-hydrochlorothiazide (DYAZIDE) 37.5-25 MG capsule Take 1 capsule by mouth every morning.    [provider]    Physical Exam: Vitals:   10/24/23 0045 10/24/23 0050 10/24/23 0205 10/24/23 0245  BP: (!) 103/52  (!) 111/58 (!) 100/53  Pulse: (!) 102  92 92  Resp: (!) 21  (!) 22 (!) 24  Temp:  97.8 F (36.6 C)  99.3 F (37.4 C)  TempSrc:  Oral    SpO2: 100%  98% 100%  Weight:        Constitutional: NAD, no pallor or diaphoresis   Eyes: PERTLA, lids and conjunctivae normal ENMT: Mucous membranes are dry. Posterior pharynx clear of any exudate or lesions.   Neck: supple, no masses  Respiratory: no wheezing, no crackles. No accessory muscle use.  Cardiovascular: S1 & S2 heard, regular rate and rhythm. No JVD. Abdomen: Soft, no tenderness. Bowel sounds active.  Musculoskeletal: no clubbing / cyanosis. No joint deformity upper and lower extremities.   Skin: Erythema, edema, heat, and tenderness involving left thigh. Skin otherwise warm, dry, well-perfused. Neurologic: CN 2-12 grossly intact. Moving all extremities. Alert and oriented.  Psychiatric: Calm. Cooperative.    Labs and Imaging on Admission: I have personally reviewed following labs and imaging studies  CBC: Recent Labs  Lab 10/23/23 2337  WBC 17.7*  NEUTROABS 14.5*  HGB 14.8  HCT 45.9  MCV 86.4  PLT 606*   Basic Metabolic Panel: Recent Labs  Lab 10/23/23 2337  NA 131*  K 5.2*  CL 96*  CO2 19*  GLUCOSE 181*  BUN 59*  CREATININE 1.34*  CALCIUM 9.6   GFR: Estimated Creatinine Clearance: 36.9 mL/min (A) (by C-G formula based on SCr of 1.34 mg/dL (H)). Liver Function Tests: Recent Labs  Lab 10/23/23 2337  AST 64*  ALT 36  ALKPHOS 119  BILITOT 1.3*  PROT 8.0  ALBUMIN 2.2*   No results for input(s): "LIPASE",  "AMYLASE" in the last 168 hours. No results for input(s): "AMMONIA" in the last 168 hours. Coagulation Profile: Recent Labs  Lab 10/23/23 2337  INR 1.6*   Cardiac Enzymes: No results for input(s): "CKTOTAL", "CKMB", "CKMBINDEX", "TROPONINI" in the last 168 hours. BNP (last 3 results) No results for input(s): "PROBNP" in the last 8760 hours. HbA1C: No results for input(s): "HGBA1C" in the last 72 hours. CBG: No results for input(s): "GLUCAP" in the last 168 hours. Lipid Profile: No results for input(s): "CHOL", "HDL", "LDLCALC", "TRIG", "CHOLHDL", "LDLDIRECT" in the last 72 hours. Thyroid Function Tests: No results for input(s): "TSH", "T4TOTAL", "FREET4", "T3FREE", "THYROIDAB" in the last 72 hours. Anemia Panel: No results for input(s): "VITAMINB12", "FOLATE", "FERRITIN", "TIBC", "IRON", "RETICCTPCT" in the last 72 hours. Urine analysis:    Component Value Date/Time   COLORURINE YELLOW 10/24/2023 0041   APPEARANCEUR HAZY (  A) 10/24/2023 0041   LABSPEC 1.026 10/24/2023 0041   PHURINE 5.0 10/24/2023 0041   GLUCOSEU NEGATIVE 10/24/2023 0041   HGBUR NEGATIVE 10/24/2023 0041   BILIRUBINUR NEGATIVE 10/24/2023 0041   KETONESUR NEGATIVE 10/24/2023 0041   PROTEINUR 30 (A) 10/24/2023 0041   NITRITE NEGATIVE 10/24/2023 0041   LEUKOCYTESUR NEGATIVE 10/24/2023 0041   Sepsis Labs: @LABRCNTIP (procalcitonin:4,lacticidven:4) ) Recent Results (from the past 240 hours)  Resp panel by RT-PCR (RSV, Flu A&B, Covid) Anterior Nasal Swab     Status: None   Collection Time: 10/23/23 11:37 PM   Specimen: Anterior Nasal Swab  Result Value Ref Range Status   SARS Coronavirus 2 by RT PCR NEGATIVE NEGATIVE Final    Comment: (NOTE) SARS-CoV-2 target nucleic acids are NOT DETECTED.  The SARS-CoV-2 RNA is generally detectable in upper respiratory specimens during the acute phase of infection. The lowest concentration of SARS-CoV-2 viral copies this assay can detect is 138 copies/mL. A negative  result does not preclude SARS-Cov-2 infection and should not be used as the sole basis for treatment or other patient management decisions. A negative result may occur with  improper specimen collection/handling, submission of specimen other than nasopharyngeal swab, presence of viral mutation(s) within the areas targeted by this assay, and inadequate number of viral copies(<138 copies/mL). A negative result must be combined with clinical observations, patient history, and epidemiological information. The expected result is Negative.  Fact Sheet for Patients:  BloggerCourse.com  Fact Sheet for Healthcare Providers:  SeriousBroker.it  This test is no t yet approved or cleared by the [place] FDA and  has been authorized for detection and/or diagnosis of SARS-CoV-2 by FDA under an Emergency Use Authorization (EUA). This EUA will remain  in effect (meaning this test can be used) for the duration of the COVID-19 declaration under Section 564(b)(1) of the Act, 21 U.S.C.section 360bbb-3(b)(1), unless the authorization is terminated  or revoked sooner.       Influenza A by PCR NEGATIVE NEGATIVE Final   Influenza B by PCR NEGATIVE NEGATIVE Final    Comment: (NOTE) The Xpert Xpress SARS-CoV-2/FLU/RSV plus assay is intended as an aid in the diagnosis of influenza from Nasopharyngeal swab specimens and should not be used as a sole basis for treatment. Nasal washings and aspirates are unacceptable for Xpert Xpress SARS-CoV-2/FLU/RSV testing.  Fact Sheet for Patients: BloggerCourse.com  Fact Sheet for Healthcare Providers: SeriousBroker.it  This test is not yet approved or cleared by the [place] FDA and has been authorized for detection and/or diagnosis of SARS-CoV-2 by FDA under an Emergency Use Authorization (EUA). This EUA will remain in effect (meaning this test can be used)  for the duration of the COVID-19 declaration under Section 564(b)(1) of the Act, 21 U.S.C. section 360bbb-3(b)(1), unless the authorization is terminated or revoked.     Resp Syncytial Virus by PCR NEGATIVE NEGATIVE Final    Comment: (NOTE) Fact Sheet for Patients: BloggerCourse.com  Fact Sheet for Healthcare Providers: SeriousBroker.it  This test is not yet approved or cleared by the [place] FDA and has been authorized for detection and/or diagnosis of SARS-CoV-2 by FDA under an Emergency Use Authorization (EUA). This EUA will remain in effect (meaning this test can be used) for the duration of the COVID-19 declaration under Section 564(b)(1) of the Act, 21 U.S.C. section 360bbb-3(b)(1), unless the authorization is terminated or revoked.  Performed at [facility], [address], [place], [place] [address]      Radiological Exams on Admission: CT  Angio Chest/Abd/Pel for Dissection W and/or Wo Contrast Result Date: 10/24/2023 CLINICAL DATA:  Septic arterial embolism. Left leg pain and swelling. History of liver abscess drained on 08/20/2024. EXAM: CT ANGIOGRAPHY CHEST, ABDOMEN AND PELVIS TECHNIQUE: Non-contrast CT of the chest was initially obtained. Multidetector CT imaging through the chest, abdomen and pelvis was performed using the standard protocol during bolus administration of intravenous contrast. Multiplanar reconstructed images and MIPs were obtained and reviewed to evaluate the vascular anatomy. RADIATION DOSE REDUCTION: This exam was performed according to the departmental dose-optimization program which includes automated exposure control, adjustment of the mA and/or kV according to patient size and/or use of iterative reconstruction technique. CONTRAST:  OMNIPAQUE IOHEXOL 350 MG/ML SOLN COMPARISON:  CT abdomen 10/21/2023. CT abdomen and pelvis 09/14/2023. FINDINGS: CTA CHEST FINDINGS  Cardiovascular: Preferential opacification of the thoracic aorta. No evidence of thoracic aortic aneurysm or dissection. Normal heart size. No pericardial effusion. There is calcified atherosclerotic disease throughout the aorta. Bovine arch anatomy is noted. Mediastinum/Nodes: No enlarged mediastinal, hilar, or axillary lymph nodes. Thyroid gland, trachea, and esophagus demonstrate no significant findings. Lungs/Pleura: Are minimal atelectatic changes in the bilateral lower lobes and lingula. There is no pleural effusion or pneumothorax. Musculoskeletal: No chest wall abnormality. No acute or significant osseous findings. Review of the MIP images confirms the above findings. CTA ABDOMEN AND PELVIS FINDINGS VASCULAR Aorta: Normal caliber aorta without aneurysm, dissection, vasculitis or significant stenosis. There is calcified atherosclerotic disease throughout the aorta. 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 Otherwise, the celiac artery and its branches appear within normal limits. SMA: Patent without evidence of aneurysm, dissection, vasculitis or significant stenosis. Renals: There is mild-to-moderate focal stenosis of the origin of the left renal artery secondary to atherosclerotic disease. Renal arteries are otherwise within normal limits. IMA: Patent without evidence of aneurysm, dissection, vasculitis or significant stenosis. Inflow: Patent without evidence of aneurysm, dissection, vasculitis or significant stenosis. Calcified atherosclerosis is present. Other: There is moderate focal stenosis in the proximal left superficial femoral artery. There severe focal stenosis and possible focal thrombosis in the proximal right profunda femoral artery. Veins: No obvious venous abnormality within the limitations of this arterial phase study. Review of the MIP images confirms the above findings. NON-VASCULAR Hepatobiliary: Percutaneous drainage catheter has  been removed. Ill-defined heterogeneous hypodensity is again seen in the central liver measuring proximally 5.9 x 3.8 cm similar to the prior examination. Rounded hypodensity in the dome of the liver is unchanged and favored as a cyst. Gallbladder is surgically absent. There stable common bile duct dilatation. Pancreas: Unremarkable. No pancreatic ductal dilatation or surrounding inflammatory changes. Spleen: Normal in size without focal abnormality. Adrenals/Urinary Tract: There some bilateral adrenal thickening which is new from prior. There is no hydronephrosis or perinephric stranding. There is a cyst in the superior pole the right kidney measuring 1.7 cm. Bladder is within normal limits. Stomach/Bowel: There is no evidence for bowel obstruction, pneumatosis or free air. There is diffuse colonic diverticulosis. There is a large amount of stool throughout the colon. The appendix is not seen. There is wall thickening of the mid sigmoid colon without surrounding inflammatory stranding. There is a new collection containing presumed fecal matter and air in the left lower quadrant measuring 6.8 x 6.2 by 6.9 cm. This is inferior to and abutting the sigmoid colon without surrounding inflammation. Small bowel and stomach are within normal limits. Lymphatic: No enlarged lymph nodes are identified. Reproductive: Small  calcified fibroids are present. Adnexa are within normal limits. Other: No abdominal wall hernia or abnormality. No abdominopelvic ascites. Musculoskeletal: No acute osseous abnormality. There is soft tissue gas and edema within the abductor thigh musculature and anterior thigh musculature. Review of the MIP images confirms the above findings. IMPRESSION: VASCULAR 1. No evidence for aortic dissection or aneurysm. 2. 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 3. Moderate focal stenosis in the proximal left superficial femoral artery. 4. Severe focal stenosis and possible focal thrombosis in the  proximal right profunda femoral artery. Aortic Atherosclerosis (ICD10-I70.0). NON_VASCULAR 1. New 6.9 cm collection containing presumed fecal matter and air in the left lower quadrant abutting the sigmoid colon. Findings may represent a contained perforation of diverticulum with abscess formation. Please correlate clinically. 2. Wall thickening of the mid sigmoid colon without surrounding inflammatory stranding. Findings may be related to colitis/diverticulitis. Underlying lesion can not be excluded. 3. Large amount of stool throughout the colon. 4. Soft tissue gas and edema within the left abductor thigh musculature and anterior thigh musculature worrisome for infection/myositis/fasciitis including necrotizing fasciitis. 5. Stable ill-defined hypodensity in the central liver favored as abscess. 6. New bilateral adrenal thickening. These results were called by telephone at the time of interpretation on 10/24/2023 at 1:38 am to provider [facility] , who verbally acknowledged these results. Electronically Signed   By: Darliss Cheney M.D.   On: 10/24/2023 01:41   DG Chest Port 1 View Result Date: 10/23/2023 CLINICAL DATA:  Questionable sepsis EXAM: PORTABLE CHEST 1 VIEW COMPARISON:  Chest x-ray 09/14/2023 FINDINGS: The heart size and mediastinal contours are within normal limits. Both lungs are clear. The visualized skeletal structures are unremarkable. IMPRESSION: No active disease. Electronically Signed   By: Darliss Cheney M.D.   On: 10/23/2023 23:49    EKG: Independently reviewed. Sinus rhythm, PAC.   Assessment/Plan  1. Left thigh soft tissue infection  - There is concern that this could be necrotizing fasciitis and surgery was consulted by the ED physician, blood cultures were collected, and antibiotics initiated  - Give 30 cc/kg LR bolus, change antibiotics to linezolid, Zosyn, and clindamycin for now, trend lactate, follow cultures and clinical course    2. ?Diverticulitis with contained  perforation  - CT findings include wall-thickening of sigmoid colon with adjacent 6.9 cm collection  - She is on appropriate antibiotic, may benefit from percutaneous drainage for the collection, will follow-up on surgery recommendations    3. AKI  - SCr is 1.34, up from baseline of 0.8  - No hydronephrosis on CT in ED  - Hydrate with IVF, repeat chem panel in am    4. Hyperglycemia  - Reactive vs new DM  - Check A1c, check CBGs, use low-intensity SSI if needed    5. PAD  - Noted on CTA in ED  - No acute ischemia, outpatient vascular surgery follow-up recommended    DVT prophylaxis: SCDs  Code Status: Full  Level of Care: Level of care: Stepdown Family Communication: Husband at bedside  Disposition Plan:  Patient is from: Home  Anticipated d/c is to: TBD Anticipated d/c date is: 10/29/23  Patient currently: pending surgical consultation, clinical improvement  Consults called: Surgery  Admission status: Inpatient     Briscoe Deutscher, MD [facility]  10/24/2023, 3:31 AM

## 2023-10-24 NOTE — Progress Notes (Signed)
PROGRESS NOTE    Ashley Galloway  UJW:[id number] DOB: August 06, 1951 DOA: 10/23/2023 PCP: Renford Dills, MD   Brief Narrative:   Critical message received from radiology in regards to left lower extremity CT scan showing circumferential and extensive air tracking along fascial planes to the left hip groin thigh posterior calf muscle strain for diffuse necrotizing fasciitis to the level of the ankle.  General surgery, Dr. Luisa Hart was called, deferred - recommended orthopedic consult given leg involvement. Orthopedics: Dr. Linna Caprice called - agreeable to assist with operation but voiced concern over extensive dissemination of air into the groin/abdomen that would likely benefit from surgical evaluation. Dr Luisa Hart called back and again deferred to orthopedics.  In the interim patient had worsening hypotension, multiple boluses over the past hour, low-dose peripheral Levophed has been initiated in the interim to bridge until a central line can be placed.  PCCM/E-Link notified of patient's condition - appreciate assistance with this difficult case.  Multiple calls to the patient's husband Mellody Dance were attempted between 18:30 and 19:30 with no answer, message left to return call immediately if able, explained "her infection was getting worse and now involved the leg" Update 19:50 - Mellody Dance returned my call - updated on the worsening infection and need for surgical intervention. He is on the way in now.  Dr Linna Caprice at bedside during this evaluation, appreciate his assistance with this difficult case. Plan to take patient down to OR this evening pending further evaluation and treatment.  Assessment & Plan:  Necrotizing fasciitis of the abdomen/Left lower extremity  Code Status:   Code Status: Full Code  Family Communication: Attempted to call Mellody Dance (cell and home #) multiple times - voicemail left to call hospital back once available. *Call returned at 19:50 - see above  Consultants:   Orthopedics General surgery  Procedures:  Plan for left leg wash out per Orthopedics: Dr Linna Caprice  Objective: Vitals:   10/24/23 1730 10/24/23 1745 10/24/23 1800 10/24/23 1815  BP: (!) 97/29  (!) 95/36 (!) 84/43  Pulse: 87     Resp: (!) 26 (!) 26 (!) 31 15  Temp:      TempSrc:      SpO2: 100%     Weight:      Height:       Intake/Output Summary (Last 24 hours) at 10/24/2023 1919 Last data filed at 10/24/2023 1813 Gross per 24 hour  Intake 5688.89 ml  Output 2150 ml  Net 3538.89 ml   Filed Weights   10/23/23 2328 10/24/23 1415  Weight: 71.3 kg 75.5 kg    Examination:  General: Resting in bed no acute distress, somnolent but easily arousable. Lungs:  Clear to auscultate bilaterally without rhonchi, wheeze, or rales. Heart: Irregularly irregular, heart rate ranging 130-150. Abdomen: Left lower quadrant drain noted draining purulent tan fluid.  Moderately tender to palpation left lower quadrant, otherwise without rebound or guarding Extremities: Left lower extremity crepitus noted at the medial thigh extending to the knee, diffuse and profound pain to palpation distal to the ankle, pulses weak on the left compared to the right  Data Reviewed: I have personally reviewed following labs and imaging studies  CBC: Recent Labs  Lab 10/23/23 2337 10/24/23 0410  WBC 17.7* 11.2*  NEUTROABS 14.5*  --   HGB 14.8 12.1  HCT 45.9 37.4  MCV 86.4 87.4  PLT 606* 456*   Basic Metabolic Panel: Recent Labs  Lab 10/23/23 2337 10/24/23 0410  NA 131* 130*  K 5.2* 4.2  CL 96*  98  CO2 19* 20*  GLUCOSE 181* 140*  BUN 59* 51*  CREATININE 1.34* 0.98  CALCIUM 9.6 8.4*   GFR: Estimated Creatinine Clearance: 52.8 mL/min (by C-G formula based on SCr of 0.98 mg/dL). Liver Function Tests: Recent Labs  Lab 10/23/23 2337 10/24/23 0410  AST 64* 51*  ALT 36 30  ALKPHOS 119 86  BILITOT 1.3* 0.7  PROT 8.0 5.8*  ALBUMIN 2.2* 1.6*   No results for input(s): "LIPASE", "AMYLASE" in  the last 168 hours. No results for input(s): "AMMONIA" in the last 168 hours. Coagulation Profile: Recent Labs  Lab 10/23/23 2337  INR 1.6*   HbA1C: Recent Labs    10/24/23 0410  HGBA1C 6.3*   CBG: Recent Labs  Lab 10/24/23 0409 10/24/23 0754  GLUCAP 118* 115*   Sepsis Labs: Recent Labs  Lab 10/23/23 2347 10/24/23 0158 10/24/23 0410 10/24/23 1323  LATICACIDVEN 6.0* 6.7* 5.9* 3.7*    Recent Results (from the past 240 hours)  Resp panel by RT-PCR (RSV, Flu A&B, Covid) Anterior Nasal Swab     Status: None   Collection Time: 10/23/23 11:37 PM   Specimen: Anterior Nasal Swab  Result Value Ref Range Status   SARS Coronavirus 2 by RT PCR NEGATIVE NEGATIVE Final    Comment: (NOTE) SARS-CoV-2 target nucleic acids are NOT DETECTED.  The SARS-CoV-2 RNA is generally detectable in upper respiratory specimens during the acute phase of infection. The lowest concentration of SARS-CoV-2 viral copies this assay can detect is 138 copies/mL. A negative result does not preclude SARS-Cov-2 infection and should not be used as the sole basis for treatment or other patient management decisions. A negative result may occur with  improper specimen collection/handling, submission of specimen other than nasopharyngeal swab, presence of viral mutation(s) within the areas targeted by this assay, and inadequate number of viral copies(<138 copies/mL). A negative result must be combined with clinical observations, patient history, and epidemiological information. The expected result is Negative.  Fact Sheet for Patients:  BloggerCourse.com  Fact Sheet for Healthcare Providers:  SeriousBroker.it  This test is no t yet approved or cleared by the [place] FDA and  has been authorized for detection and/or diagnosis of SARS-CoV-2 by FDA under an Emergency Use Authorization (EUA). This EUA will remain  in effect (meaning this test can be  used) for the duration of the COVID-19 declaration under Section 564(b)(1) of the Act, 21 U.S.C.section 360bbb-3(b)(1), unless the authorization is terminated  or revoked sooner.       Influenza A by PCR NEGATIVE NEGATIVE Final   Influenza B by PCR NEGATIVE NEGATIVE Final    Comment: (NOTE) The Xpert Xpress SARS-CoV-2/FLU/RSV plus assay is intended as an aid in the diagnosis of influenza from Nasopharyngeal swab specimens and should not be used as a sole basis for treatment. Nasal washings and aspirates are unacceptable for Xpert Xpress SARS-CoV-2/FLU/RSV testing.  Fact Sheet for Patients: BloggerCourse.com  Fact Sheet for Healthcare Providers: SeriousBroker.it  This test is not yet approved or cleared by the [place] FDA and has been authorized for detection and/or diagnosis of SARS-CoV-2 by FDA under an Emergency Use Authorization (EUA). This EUA will remain in effect (meaning this test can be used) for the duration of the COVID-19 declaration under Section 564(b)(1) of the Act, 21 U.S.C. section 360bbb-3(b)(1), unless the authorization is terminated or revoked.     Resp Syncytial Virus by PCR NEGATIVE NEGATIVE Final    Comment: (NOTE) Fact Sheet for Patients: BloggerCourse.com  Fact Sheet  for Healthcare Providers: SeriousBroker.it  This test is not yet approved or cleared by the [place] and has been authorized for detection and/or diagnosis of SARS-CoV-2 by FDA under an Emergency Use Authorization (EUA). This EUA will remain in effect (meaning this test can be used) for the duration of the COVID-19 declaration under Section 564(b)(1) of the Act, 21 U.S.C. section 360bbb-3(b)(1), unless the authorization is terminated or revoked.  Performed at [facility], [address] [place], [place] [address]   Blood Culture (routine x 2)      Status: None (Preliminary result)   Collection Time: 10/23/23 11:37 PM   Specimen: BLOOD  Result Value Ref Range Status   Specimen Description   Final    BLOOD LEFT ANTECUBITAL Performed at [facility], [address] [place], [place] [address]    Special Requests   Final    BOTTLES DRAWN AEROBIC AND ANAEROBIC Blood Culture results may not be optimal due to an inadequate volume of blood received in culture bottles Performed at [facility], [address] [place], [place] [address]    Culture   Final    NO GROWTH < 12 HOURS Performed at [facility], [address], [place], [place] [address]    Report Status PENDING  Incomplete  Blood Culture (routine x 2)     Status: None (Preliminary result)   Collection Time: 10/24/23 12:41 AM   Specimen: BLOOD  Result Value Ref Range Status   Specimen Description   Final    BLOOD BLOOD RIGHT ARM Performed at [facility], [address] [place], [place] [address]    Special Requests   Final    BOTTLES DRAWN AEROBIC AND ANAEROBIC Blood Culture results may not be optimal due to an inadequate volume of blood received in culture bottles Performed at [facility], [address], [place], [place] [address]    Culture   Final    NO GROWTH < 12 HOURS Performed at [facility], [address], [place], [place] [address]    Report Status PENDING  Incomplete  MRSA Next Gen by PCR, Nasal     Status: None   Collection Time: 10/24/23  1:02 PM   Specimen: Nasal Mucosa; Nasal Swab  Result Value Ref Range Status   MRSA by PCR Next Gen NOT DETECTED NOT DETECTED Final    Comment: (NOTE) The GeneXpert MRSA Assay (FDA approved for NASAL specimens only), is one component of a comprehensive MRSA colonization surveillance program. It is not intended to diagnose MRSA infection nor to guide or monitor treatment for MRSA infections. Test performance is not FDA approved  in patients less than 67 years old. Performed at [facility], [address], [place], [place] [address]          Radiology Studies: CT EXTREMITY LOWER LEFT WO CONTRAST Result Date: 10/24/2023 CLINICAL DATA:  Recent drain placement and abdomen. Scan due to pain, swelling, and subcutaneous free air. Evaluate for necrotizing fasciitis. EXAM: CT OF THE LOWER LEFT EXTREMITY WITHOUT CONTRAST TECHNIQUE: Multidetector CT imaging of the lower left extremity was performed according to the standard protocol. RADIATION DOSE REDUCTION: This exam was performed according to the departmental dose-optimization program which includes automated exposure control, adjustment of the mA and/or kV according to patient size and/or use of iterative reconstruction technique. COMPARISON:  CT chest, abdomen, and pelvis 10/24/2023 FINDINGS: Bones/Joint/Cartilage No cortical erosion is seen.  No acute fracture. There are individual screws within the distal aspect of the first and fifth metatarsals. Ligaments Suboptimally assessed by CT. Muscles and Tendons There is again air seen tracking along the sartorius and the left groin musculature as on CT of the chest, abdomen, and pelvis earlier today. There is extension of this air within the fascial planes surrounding these muscles throughout the more distal thigh, and air is seen around fascial planes within the sartorius, anterior left quadriceps, obturator externus, adductor longus, brevis, and magnus. Within the mid to distal thigh air surrounds the hamstring fascial planes diffusely. Within the calf, air surrounds the fascial planes of the medial greater than lateral heads of the gastrocnemius muscles to the level of the ankle. Findings are concerning for necrotizing fasciitis diffusely throughout the left lower leg. Within the thigh, although the anterior superficial portion of the quadriceps musculature appears affected, and the deep fascial planes of the  quadriceps musculature do not demonstrate air. Within the calf, the anterior compartment and deep posterior compartment appear less affected. Soft tissues There is moderate diffuse subcutaneous fat edema and swelling throughout the left thigh and calf. There is density likely representing confluent edema lateral to the knee (axial series 6, image 319 and coronal series 9, image 118). No walled-off abscess is seen. There is a new anterior left pelvic approach drainage catheter terminating within the region of the complex collection containing air and fluid within left hemipelvis on CT chest, abdomen, pelvis earlier today. That suspected abscess adjacent to the wall thickening within the sigmoid colon is markedly decreased from prior. A Foley catheter is seen within the urinary bladder which contains contrast. IMPRESSION: 1. There is extensive air tracking along the fascial planes of the left hip, groin, thigh, and posterior calf musculature concerning for diffuse necrotizing fasciitis extending down to the level of the ankle. 2. There is moderate diffuse subcutaneous fat edema and swelling throughout the left thigh and calf. There is density likely representing confluent edema lateral to the knee. No walled-off abscess is seen. 3. There is a new anterior left pelvic approach drainage catheter terminating within the region of the complex collection containing air and fluid within left hemipelvis on CT chest, abdomen, pelvis earlier today. That suspected abscess adjacent to the wall thickening within the sigmoid colon is markedly decreased from prior. Critical Value/emergent results were called by telephone at the time of interpretation on 10/24/2023 at 6:12 pm to provider Carma Leaven , who verbally acknowledged these results. Electronically Signed   By: Neita Garnet M.D.   On: 10/24/2023 18:15   CT Angio Chest/Abd/Pel for Dissection W and/or Wo Contrast Result Date: 10/24/2023 CLINICAL DATA:  Septic arterial  embolism. Left leg pain and swelling. History of liver abscess drained on 08/20/2024. EXAM: CT ANGIOGRAPHY CHEST, ABDOMEN AND PELVIS TECHNIQUE: Non-contrast CT of the chest was initially obtained. Multidetector CT imaging through the chest, abdomen and pelvis was performed using the standard protocol during bolus administration of intravenous contrast. Multiplanar reconstructed images and MIPs were obtained and reviewed to evaluate the vascular anatomy. RADIATION DOSE REDUCTION: This exam was performed according to the departmental dose-optimization program which includes automated exposure control, adjustment of the mA and/or kV according to patient size and/or use of iterative reconstruction technique. CONTRAST:  OMNIPAQUE IOHEXOL 350 MG/ML SOLN COMPARISON:  CT abdomen 10/21/2023. CT abdomen and pelvis 09/14/2023. FINDINGS: CTA CHEST FINDINGS Cardiovascular: Preferential opacification of the thoracic aorta. No evidence of thoracic aortic aneurysm or dissection. Normal heart size. No  pericardial effusion. There is calcified atherosclerotic disease throughout the aorta. Bovine arch anatomy is noted. Mediastinum/Nodes: No enlarged mediastinal, hilar, or axillary lymph nodes. Thyroid gland, trachea, and esophagus demonstrate no significant findings. Lungs/Pleura: Are minimal atelectatic changes in the bilateral lower lobes and lingula. There is no pleural effusion or pneumothorax. Musculoskeletal: No chest wall abnormality. No acute or significant osseous findings. Review of the MIP images confirms the above findings. CTA ABDOMEN AND PELVIS FINDINGS VASCULAR Aorta: Normal caliber aorta without aneurysm, dissection, vasculitis or significant stenosis. There is calcified atherosclerotic disease throughout the aorta. 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 Otherwise, the celiac artery and its branches appear within normal limits. SMA: Patent without  evidence of aneurysm, dissection, vasculitis or significant stenosis. Renals: There is mild-to-moderate focal stenosis of the origin of the left renal artery secondary to atherosclerotic disease. Renal arteries are otherwise within normal limits. IMA: Patent without evidence of aneurysm, dissection, vasculitis or significant stenosis. Inflow: Patent without evidence of aneurysm, dissection, vasculitis or significant stenosis. Calcified atherosclerosis is present. Other: There is moderate focal stenosis in the proximal left superficial femoral artery. There severe focal stenosis and possible focal thrombosis in the proximal right profunda femoral artery. Veins: No obvious venous abnormality within the limitations of this arterial phase study. Review of the MIP images confirms the above findings. NON-VASCULAR Hepatobiliary: Percutaneous drainage catheter has been removed. Ill-defined heterogeneous hypodensity is again seen in the central liver measuring proximally 5.9 x 3.8 cm similar to the prior examination. Rounded hypodensity in the dome of the liver is unchanged and favored as a cyst. Gallbladder is surgically absent. There stable common bile duct dilatation. Pancreas: Unremarkable. No pancreatic ductal dilatation or surrounding inflammatory changes. Spleen: Normal in size without focal abnormality. Adrenals/Urinary Tract: There some bilateral adrenal thickening which is new from prior. There is no hydronephrosis or perinephric stranding. There is a cyst in the superior pole the right kidney measuring 1.7 cm. Bladder is within normal limits. Stomach/Bowel: There is no evidence for bowel obstruction, pneumatosis or free air. There is diffuse colonic diverticulosis. There is a large amount of stool throughout the colon. The appendix is not seen. There is wall thickening of the mid sigmoid colon without surrounding inflammatory stranding. There is a new collection containing presumed fecal matter and air in the left  lower quadrant measuring 6.8 x 6.2 by 6.9 cm. This is inferior to and abutting the sigmoid colon without surrounding inflammation. Small bowel and stomach are within normal limits. Lymphatic: No enlarged lymph nodes are identified. Reproductive: Small calcified fibroids are present. Adnexa are within normal limits. Other: No abdominal wall hernia or abnormality. No abdominopelvic ascites. Musculoskeletal: No acute osseous abnormality. There is soft tissue gas and edema within the abductor thigh musculature and anterior thigh musculature. Review of the MIP images confirms the above findings. IMPRESSION: VASCULAR 1. No evidence for aortic dissection or aneurysm. 2. 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 3. Moderate focal stenosis in the proximal left superficial femoral artery. 4. Severe focal stenosis and possible focal thrombosis in the proximal right profunda femoral artery. Aortic Atherosclerosis (ICD10-I70.0). NON_VASCULAR 1. New 6.9 cm collection containing presumed fecal matter and air in the left lower quadrant abutting the sigmoid colon. Findings may represent a contained perforation of diverticulum with abscess formation. Please correlate clinically. 2. Wall thickening of the mid sigmoid colon without surrounding inflammatory stranding. Findings may be related to colitis/diverticulitis. Underlying lesion can not  be excluded. 3. Large amount of stool throughout the colon. 4. Soft tissue gas and edema within the left abductor thigh musculature and anterior thigh musculature worrisome for infection/myositis/fasciitis including necrotizing fasciitis. 5. Stable ill-defined hypodensity in the central liver favored as abscess. 6. New bilateral adrenal thickening. These results were called by telephone at the time of interpretation on 10/24/2023 at 1:38 am to provider [facility] , who verbally acknowledged these results. Electronically Signed   By: Darliss Cheney M.D.   On: 10/24/2023 01:41    DG Chest Port 1 View Result Date: 10/23/2023 CLINICAL DATA:  Questionable sepsis EXAM: PORTABLE CHEST 1 VIEW COMPARISON:  Chest x-ray 09/14/2023 FINDINGS: The heart size and mediastinal contours are within normal limits. Both lungs are clear. The visualized skeletal structures are unremarkable. IMPRESSION: No active disease. Electronically Signed   By: Darliss Cheney M.D.   On: 10/23/2023 23:49        Scheduled Meds:  Chlorhexidine Gluconate Cloth  6 each Topical Daily   sodium chloride flush  3 mL Intravenous Q12H   Continuous Infusions:  sodium chloride     clindamycin (CLEOCIN) IV Stopped (10/24/23 1341)   lactated ringers 10 mL/hr at 10/24/23 1619   linezolid (ZYVOX) IV Stopped (10/24/23 1538)   norepinephrine (LEVOPHED) Adult infusion 2 mcg/min (10/24/23 1905)   piperacillin-tazobactam (ZOSYN)  IV 3.375 g (10/24/23 1653)     LOS: 0 days   Time spent:  Azucena Fallen, DO [facility]  If 7PM-7AM, please contact night-coverage [email or web address]  10/24/2023, 7:19 PM

## 2023-10-24 NOTE — Op Note (Incomplete)
OPERATIVE REPORT   10/24/2023  11:51 PM  PATIENT:  [name]   SURGEON:  Jonette Pesa, MD  ASSISTANT:  Clint Bolder, PA-C.   PREOPERATIVE DIAGNOSIS: Necrotizing fasciitis left lower extremity.  POSTOPERATIVE DIAGNOSIS: Same.  PROCEDURE: 1.  Excisional debridement of subcutaneous tissue, fascia, and muscle left lower extremity. 2.  Placement of left lower extremity wound VAC.  ANESTHESIA:   GETA.  ANTIBIOTICS: Patient is receiving scheduled IV antibiotics.  IMPLANTS: None.  SPECIMENS:  1.  Left thigh abscess tissue swab for aerobic and anaerobic culture. 2.  Left thigh posterior compartment muscle for aerobic and anaerobic culture.  COMPLICATIONS: None.  DISPOSITION: Critical to ICU.  SURGICAL INDICATIONS:  [name] is a 73 y.o. female with extensive necrotizing fasciitis involving the left thigh and lower leg.  CT scan of the left lower extremity from the pelvis through the ankle was personally reviewed by myself.  She is hemodynamically unstable.  She was indicated for emergent operative debridement of her left lower extremity as a lifesaving procedure.  I discussed the risks, benefits, and alternatives with the patient and her husband, and they elected to proceed.  The risks, benefits, and alternatives were discussed with the patient preoperatively including but not limited to the risks of infection, bleeding, nerve / blood vessel injury, cardiopulmonary complications, the need for repeat surgery, loss of limb, loss of life, among others, and the patient was willing to proceed.  PROCEDURE IN DETAIL: The patient was taken from the ICU to the operating room in critical condition.  The patient was identified with 2 identifiers.  She was placed supine on the operating room table.  General anesthesia was induced.  Foley catheter was already in place.  The left lower extremity was prepped and draped in the normal sterile surgical fashion.   Timeout was called, verifying side and site of surgery.  The patient has already been receiving scheduled IV antibiotics.  I began by making a longitudinal incision using a #10 blade from the lateral femoral condyle to the tip of the greater trochanter.  Dissection was performed until I reached the IT band.  Using digital dissection, I was able to circumferentially reach the medial thigh.  I encountered dishwasher, foul-smelling fluid.  I obtained a swab of the fluid, which was sent for aerobic and anaerobic culture.  On the medial aspect of the thigh, the fascia overlying the adductors was compromised.  I was able to digitally dissect proximally along the adductors to the outer aspect of the pelvis.  All devitalized fascial tissue was excisionally debrided with a rongeur.  Any obvious necrotic muscle tissue was also excisionally debrided with a rongeur.  At this point, I longitudinally incised the IT band.  I identified the vastus lateralis muscle, which I meticulously dissected off the posterior intermuscular septum.  POSTOPERATIVE PLAN:  ***

## 2023-10-25 DIAGNOSIS — K5720 Diverticulitis of large intestine with perforation and abscess without bleeding: Secondary | ICD-10-CM | POA: Diagnosis not present

## 2023-10-25 DIAGNOSIS — K651 Peritoneal abscess: Secondary | ICD-10-CM | POA: Diagnosis not present

## 2023-10-25 DIAGNOSIS — J449 Chronic obstructive pulmonary disease, unspecified: Secondary | ICD-10-CM | POA: Diagnosis not present

## 2023-10-25 DIAGNOSIS — G9341 Metabolic encephalopathy: Secondary | ICD-10-CM | POA: Diagnosis not present

## 2023-10-25 DIAGNOSIS — R6521 Severe sepsis with septic shock: Secondary | ICD-10-CM | POA: Diagnosis not present

## 2023-10-25 DIAGNOSIS — E43 Unspecified severe protein-calorie malnutrition: Secondary | ICD-10-CM | POA: Insufficient documentation

## 2023-10-25 DIAGNOSIS — M7989 Other specified soft tissue disorders: Secondary | ICD-10-CM

## 2023-10-25 DIAGNOSIS — M726 Necrotizing fasciitis: Secondary | ICD-10-CM | POA: Diagnosis not present

## 2023-10-25 DIAGNOSIS — R7303 Prediabetes: Secondary | ICD-10-CM | POA: Diagnosis not present

## 2023-10-25 DIAGNOSIS — N179 Acute kidney failure, unspecified: Secondary | ICD-10-CM | POA: Diagnosis not present

## 2023-10-25 DIAGNOSIS — E871 Hypo-osmolality and hyponatremia: Secondary | ICD-10-CM | POA: Diagnosis not present

## 2023-10-25 DIAGNOSIS — A419 Sepsis, unspecified organism: Secondary | ICD-10-CM | POA: Diagnosis not present

## 2023-10-25 DIAGNOSIS — I10 Essential (primary) hypertension: Secondary | ICD-10-CM | POA: Diagnosis not present

## 2023-10-25 DIAGNOSIS — M60052 Infective myositis, left thigh: Secondary | ICD-10-CM | POA: Diagnosis not present

## 2023-10-25 DIAGNOSIS — I4891 Unspecified atrial fibrillation: Secondary | ICD-10-CM | POA: Diagnosis not present

## 2023-10-25 LAB — BASIC METABOLIC PANEL
Anion gap: 9 (ref 5–15)
Anion gap: 10 (ref 5–15)
BUN: 17 mg/dL (ref 8–23)
BUN: 12 mg/dL (ref 8–23)
CO2: 23 mmol/L (ref 22–32)
CO2: 22 mmol/L (ref 22–32)
Calcium: 7.9 mg/dL — ABNORMAL LOW (ref 8.9–10.3)
Calcium: 7.9 mg/dL — ABNORMAL LOW (ref 8.9–10.3)
Chloride: 100 mmol/L (ref 98–111)
Chloride: 104 mmol/L (ref 98–111)
Creatinine, Ser: 0.53 mg/dL (ref 0.44–1.00)
Creatinine, Ser: 0.52 mg/dL (ref 0.44–1.00)
GFR, Estimated: >60 mL/min (ref >60)
GFR, Estimated: >60 mL/min (ref >60)
Glucose, Bld: 142 mg/dL — ABNORMAL HIGH (ref 70–99)
Glucose, Bld: 116 mg/dL — ABNORMAL HIGH (ref 70–99)
Potassium: 3 mmol/L — ABNORMAL LOW (ref 3.5–5.1)
Potassium: 3.7 mmol/L (ref 3.5–5.1)
Sodium: 132 mmol/L — ABNORMAL LOW (ref 135–145)
Sodium: 136 mmol/L (ref 135–145)

## 2023-10-25 LAB — PREPARE RBC (CROSSMATCH)

## 2023-10-25 LAB — CULTURE, BLOOD (ROUTINE X 2)

## 2023-10-25 LAB — CBC
HCT: 23.7 % — ABNORMAL LOW (ref 36.0–46.0)
Hemoglobin: 7.8 g/dL — ABNORMAL LOW (ref 12.0–15.0)
MCH: 28.5 pg (ref 26.0–34.0)
MCHC: 32.9 g/dL (ref 30.0–36.0)
MCV: 86.5 fL (ref 80.0–100.0)
Platelets: 279 10*3/uL (ref 150–400)
RBC: 2.74 MIL/uL — ABNORMAL LOW (ref 3.87–5.11)
RDW: 14.3 % (ref 11.5–15.5)
WBC: 6.6 10*3/uL (ref 4.0–10.5)
nRBC: 0 % (ref 0.0–0.2)

## 2023-10-25 LAB — GLUCOSE, CAPILLARY
Glucose-Capillary: 105 mg/dL — ABNORMAL HIGH (ref 70–99)
Glucose-Capillary: 112 mg/dL — ABNORMAL HIGH (ref 70–99)
Glucose-Capillary: 123 mg/dL — ABNORMAL HIGH (ref 70–99)
Glucose-Capillary: 65 mg/dL — ABNORMAL LOW (ref 70–99)
Glucose-Capillary: 78 mg/dL (ref 70–99)
Glucose-Capillary: 78 mg/dL (ref 70–99)
Glucose-Capillary: 94 mg/dL (ref 70–99)
Glucose-Capillary: 96 mg/dL (ref 70–99)
Glucose-Capillary: 97 mg/dL (ref 70–99)

## 2023-10-25 LAB — LACTIC ACID, PLASMA
Lactic Acid, Venous: 3.1 mmol/L (ref 0.5–1.9)
Lactic Acid, Venous: 2.6 mmol/L (ref 0.5–1.9)

## 2023-10-25 LAB — MAGNESIUM: Magnesium: 1.8 mg/dL (ref 1.7–2.4)

## 2023-10-25 LAB — PHOSPHORUS: Phosphorus: 2 mg/dL — ABNORMAL LOW (ref 2.5–4.6)

## 2023-10-25 LAB — FIBRINOGEN: Fibrinogen: 553 mg/dL — ABNORMAL HIGH (ref 210–475)

## 2023-10-25 LAB — ABO/RH: ABO/RH(D): A POS

## 2023-10-25 LAB — HIV ANTIBODY (ROUTINE TESTING W REFLEX): HIV Screen 4th Generation wRfx: NONREACTIVE

## 2023-10-25 MED ORDER — LINEZOLID 600 MG/300ML IV SOLN
600.0000 mg | Freq: Two times a day (BID) | INTRAVENOUS | Status: DC
  Administered 2023-10-25 – 2023-11-01 (×15): 600 mg via INTRAVENOUS
  Filled 2023-10-25 (×18): qty 300

## 2023-10-25 MED ORDER — POTASSIUM CHLORIDE 10 MEQ/50ML IV SOLN
10.0000 meq | INTRAVENOUS | Status: AC
  Administered 2023-10-25 (×4): 10 meq via INTRAVENOUS
  Filled 2023-10-25 (×4): qty 50

## 2023-10-25 MED ORDER — POTASSIUM PHOSPHATES 15 MMOLE/5ML IV SOLN
15.0000 mmol | Freq: Once | INTRAVENOUS | Status: AC
  Administered 2023-10-25: 15 mmol via INTRAVENOUS
  Filled 2023-10-25: qty 5

## 2023-10-25 MED ORDER — VANCOMYCIN HCL 1500 MG/300ML IV SOLN
1500.0000 mg | INTRAVENOUS | Status: DC
  Administered 2023-10-26: 1500 mg via INTRAVENOUS
  Filled 2023-10-25: qty 300

## 2023-10-25 MED ORDER — DEXTROSE 50 % IV SOLN: 1.0000 | Freq: Once | INTRAVENOUS | Status: AC

## 2023-10-25 MED ORDER — MAGNESIUM SULFATE 2 GM/50ML IV SOLN
2.0000 g | Freq: Once | INTRAVENOUS | Status: AC
  Administered 2023-10-25: 2 g via INTRAVENOUS
  Filled 2023-10-25: qty 50

## 2023-10-25 MED ORDER — SODIUM CHLORIDE 0.9% FLUSH
5.0000 mL | Freq: Three times a day (TID) | INTRAVENOUS | Status: DC
  Administered 2023-10-26 – 2023-11-02 (×21): 5 mL

## 2023-10-25 MED ORDER — VANCOMYCIN HCL 1500 MG/300ML IV SOLN
1500.0000 mg | Freq: Once | INTRAVENOUS | Status: AC
  Administered 2023-10-25: 1500 mg via INTRAVENOUS
  Filled 2023-10-25: qty 300

## 2023-10-25 MED ORDER — DEXTROSE IN LACTATED RINGERS 5 % IV SOLN: INTRAVENOUS | Status: AC

## 2023-10-25 MED ORDER — SODIUM CHLORIDE 0.9 % IV SOLN
100.0000 mg | INTRAVENOUS | Status: DC
  Administered 2023-10-25 – 2023-10-26 (×2): 100 mg via INTRAVENOUS
  Filled 2023-10-25 (×3): qty 5

## 2023-10-25 MED ORDER — DEXTROSE 50 % IV SOLN
INTRAVENOUS | Status: AC
  Administered 2023-10-25: 25 mL via INTRAVENOUS
  Filled 2023-10-25: qty 50

## 2023-10-25 MED ORDER — ACETAMINOPHEN 10 MG/ML IV SOLN
1000.0000 mg | Freq: Once | INTRAVENOUS | Status: AC
  Administered 2023-10-25: 1000 mg via INTRAVENOUS
  Filled 2023-10-25: qty 100

## 2023-10-25 MED ORDER — LACTATED RINGERS IV BOLUS
1000.0000 mL | Freq: Once | INTRAVENOUS | Status: AC
  Administered 2023-10-25: 1000 mL via INTRAVENOUS

## 2023-10-25 NOTE — Progress Notes (Signed)
eLink Physician-Brief Progress Note Patient Name: Ashley Galloway DOB: [birth year] MRN: [id number]   Date of Service  10/25/2023  HPI/Events of Note  Notified of a positive blood culture.  Patient with intra-abdominal abscess and necrotizing fasciitis.  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  eICU Interventions  Will add vancomycin to her regimen.  Discussed with Pharm.D.     Intervention Category Intermediate Interventions: Infection - evaluation and management  [name] 10/25/2023, 1:40 AM

## 2023-10-25 NOTE — Anesthesia Postprocedure Evaluation (Signed)
Anesthesia Post Note  Patient: Ashley Galloway  Procedure(s) Performed: IRRIGATION AND DEBRIDEMENT EXTREMITY Left lower extremity (Left)     Patient location during evaluation: ICU Anesthesia Type: General Level of consciousness: awake and alert, oriented and patient cooperative Pain management: pain level controlled Vital Signs Assessment: post-procedure vital signs reviewed and stable Respiratory status: spontaneous breathing, nonlabored ventilation and respiratory function stable Cardiovascular status: blood pressure returned to baseline and stable Postop Assessment: no apparent nausea or vomiting Anesthetic complications: no Comments: Transported with RN to ICU, vitals stable on minimal pressors   No notable events documented.  Last Vitals:  Vitals:   10/24/23 2345 10/24/23 2358  BP: (!) 111/43 (!) 115/57  Pulse:    Resp:    Temp:    SpO2: 100%     Last Pain:  Vitals:   10/24/23 2345  TempSrc:   PainSc: Asleep                 Lannie Fields

## 2023-10-25 NOTE — Consult Note (Addendum)
[facility]  Total days of antibiotics3/piptazo plus clinda and vanco Reason for Consult:necrotizing fasciitis    Referring Physician: desai  Principal Problem:   Diverticulitis of large intestine with abscess Active Problems:   Infective myositis of left thigh   AKI (acute kidney injury) (HCC)   PAD (peripheral artery disease) (HCC)   Hyperglycemia   Protein-calorie malnutrition, severe    HPI: Ashley Galloway is a 73 y.o. female with hx of PAD, diverticulitis, and also hx of hepatic abscess s/p IR drain in 09/16/2023, drain removed. She had been treated for strep intermedius hepatic abscess and bacteremia in December 2024. She is followed by Dr. Renold Don from infectious disease. On 1/9, she had finished metronidazole but was to continue to finish ceftriaxone through Oct 14 2023 and then she started to take cefadroxil on 1/16 for 2 more weeks to take her to end of January 2025.she started to have nausea and vomiting per phone note on 1/20. In addition,  She has been having left leg pain that was initially thought to be sciatica for the past 2 weeks however 8 days ago her husband states that she noticed swelling/lump to upper left thigh.  On admit on 1/24, she presented with fever, complained of left leg pain ,swelling. Labs were significant for lactic acidosis of 6.leukocytosis of 17K, with left shift.   She had imaging that showed - left SFA moderate focal stenosis and severe focal stenosis and fossible focal thrombosis in the proximal right profunda femoral artery. New 6.9cm collection possible fecal matter  and air in the LLQ abutting hte sigmoid colon concern for perforation of diverticulum with abscess formation. Liver still has ill defined hypodensity measuring 6 x 4 cm. Imaging also suggested that fluid/air tracking into abductor muscle concerning for myositis .  Patient was seen by general surgery but felt that it was best to have IR place drain in  intra-abdominal abscess - Cultures sent , gram stain showing gpc, gnr and gpr.   Underwent excisional debridement of left thigh abscess and medial thigh on evening of 11/25. Op note reports dishwater foul smelling fluid with devitalized fascial tissue debrided. Cultures sent , gram stain showing gpc, and GNR.   She was empirically started on vanco, piptazo and clindamycin. Blood cx showing 1 of 4 with MRSE from 11pm 1/24 cultures.  Despite surgery last night, this afternoon she remains febrile, tachycardic with decreasing BP  Past Medical History:  Diagnosis Date   Centrilobular emphysema (HCC)    Colon polyps    Looks like saw Dr. Marca Ancona in 2021   Diverticulitis    HLD (hyperlipidemia)    TIA (transient ischemic attack)    Vesicointestinal fistula     Allergies:  Allergies  Allergen Reactions   Tape Other (See Comments)    Tape from hormone patch.    MEDICATIONS:  Chlorhexidine Gluconate Cloth  6 each Topical Daily   heparin injection (subcutaneous)  5,000 Units Subcutaneous Q8H   insulin aspart  2-6 Units Subcutaneous Q4H   sodium chloride flush  3 mL Intravenous Q12H    Social History   Tobacco Use   Smoking status: Every Day    Types: Cigarettes    Passive exposure: Current   Smokeless tobacco: Never    Family History  Problem Relation Age of Onset   Breast cancer Neg Hx     Review of Systems - leg and abdominal pain. Encephalopathic.   OBJECTIVE: Temp:  [97.5 F (36.4 C)-101.1  F (38.4 C)] 101.1 F (38.4 C) (01/26 1510) Pulse Rate:  [77-91] 77 (01/25 2328) Resp:  [10-31] 28 (01/26 1510) BP: (84-115)/(29-75) 102/75 (01/26 0000) SpO2:  [99 %-100 %] 100 % (01/26 0800) Arterial Line BP: (99-126)/(51-62) 124/61 (01/25 2358) Physical Exam  Constitutional:  oriented to person, place, and time. appears well-developed and well-nourished. No distress.  HENT: [place]/AT, PERRLA, no scleral icterus Mouth/Throat: Oropharynx is clear and moist. No oropharyngeal exudate.   Cardiovascular: tachycardic. regular rhythm and normal heart sounds. Exam reveals no gallop and no friction rub.  No murmur heard.  Pulmonary/Chest: Effort normal and breath sounds normal. No respiratory distress.  has no wheezes.  Neck = supple, no nuchal rigidity Abdominal: Soft. Bowel sounds are normal.  exhibits no distension. There is no tenderness. Drain+ Ext: wound vac in left leg - serosanginous fluid Lymphadenopathy: no cervical adenopathy. No axillary adenopathy Neurological: alert and oriented to person, place, and time.  Skin: Skin is warm and dry. No rash noted. No erythema.  Psychiatric: a normal mood and affect.  behavior is normal.   LABS: Results for orders placed or performed during the hospital encounter of 10/23/23 (from the past 48 hours)  Resp panel by RT-PCR (RSV, Flu A&B, Covid) Anterior Nasal Swab     Status: None   Collection Time: 10/23/23 11:37 PM   Specimen: Anterior Nasal Swab  Result Value Ref Range   SARS Coronavirus 2 by RT PCR NEGATIVE NEGATIVE    Comment: (NOTE) SARS-CoV-2 target nucleic acids are NOT DETECTED.  The SARS-CoV-2 RNA is generally detectable in upper respiratory specimens during the acute phase of infection. The lowest concentration of SARS-CoV-2 viral copies this assay can detect is 138 copies/mL. A negative result does not preclude SARS-Cov-2 infection and should not be used as the sole basis for treatment or other patient management decisions. A negative result may occur with  improper specimen collection/handling, submission of specimen other than nasopharyngeal swab, presence of viral mutation(s) within the areas targeted by this assay, and inadequate number of viral copies(<138 copies/mL). A negative result must be combined with clinical observations, patient history, and epidemiological information. The expected result is Negative.  Fact Sheet for Patients:  BloggerCourse.com  Fact Sheet for  Healthcare Providers:  SeriousBroker.it  This test is no t yet approved or cleared by the [place] FDA and  has been authorized for detection and/or diagnosis of SARS-CoV-2 by FDA under an Emergency Use Authorization (EUA). This EUA will remain  in effect (meaning this test can be used) for the duration of the COVID-19 declaration under Section 564(b)(1) of the Act, 21 U.S.C.section 360bbb-3(b)(1), unless the authorization is terminated  or revoked sooner.       Influenza A by PCR NEGATIVE NEGATIVE   Influenza B by PCR NEGATIVE NEGATIVE    Comment: (NOTE) The Xpert Xpress SARS-CoV-2/FLU/RSV plus assay is intended as an aid in the diagnosis of influenza from Nasopharyngeal swab specimens and should not be used as a sole basis for treatment. Nasal washings and aspirates are unacceptable for Xpert Xpress SARS-CoV-2/FLU/RSV testing.  Fact Sheet for Patients: BloggerCourse.com  Fact Sheet for Healthcare Providers: SeriousBroker.it  This test is not yet approved or cleared by the [place] FDA and has been authorized for detection and/or diagnosis of SARS-CoV-2 by FDA under an Emergency Use Authorization (EUA). This EUA will remain in effect (meaning this test can be used) for the duration of the COVID-19 declaration under Section 564(b)(1) of the Act, 21 U.S.C. section 360bbb-3(b)(1), unless  the authorization is terminated or revoked.     Resp Syncytial Virus by PCR NEGATIVE NEGATIVE    Comment: (NOTE) Fact Sheet for Patients: BloggerCourse.com  Fact Sheet for Healthcare Providers: SeriousBroker.it  This test is not yet approved or cleared by the [place] FDA and has been authorized for detection and/or diagnosis of SARS-CoV-2 by FDA under an Emergency Use Authorization (EUA). This EUA will remain in effect (meaning this test can be used)  for the duration of the COVID-19 declaration under Section 564(b)(1) of the Act, 21 U.S.C. section 360bbb-3(b)(1), unless the authorization is terminated or revoked.  Performed at [facility], [address] [place], [place] [address]   Comprehensive metabolic panel     Status: Abnormal   Collection Time: 10/23/23 11:37 PM  Result Value Ref Range   Sodium 131 (L) 135 - 145 mmol/L   Potassium 5.2 (H) 3.5 - 5.1 mmol/L   Chloride 96 (L) 98 - 111 mmol/L   CO2 19 (L) 22 - 32 mmol/L   Glucose, Bld 181 (H) 70 - 99 mg/dL    Comment: Glucose reference range applies only to samples taken after fasting for at least 8 hours.   BUN 59 (H) 8 - 23 mg/dL   Creatinine, Ser 5.18 (H) 0.44 - 1.00 mg/dL   Calcium 9.6 8.9 - 84.1 mg/dL   Total Protein 8.0 6.5 - 8.1 g/dL   Albumin 2.2 (L) 3.5 - 5.0 g/dL   AST 64 (H) 15 - 41 U/L   ALT 36 0 - 44 U/L   Alkaline Phosphatase 119 38 - 126 U/L   Total Bilirubin 1.3 (H) 0.0 - 1.2 mg/dL   GFR, Estimated 42 (L) >60 mL/min    Comment: (NOTE) Calculated using the CKD-EPI Creatinine Equation (2021)    Anion gap 16 (H) 5 - 15    Comment: Performed at [facility], [address], [place], [place] [address]  CBC with Differential     Status: Abnormal   Collection Time: 10/23/23 11:37 PM  Result Value Ref Range   WBC 17.7 (H) 4.0 - 10.5 K/uL   RBC 5.31 (H) 3.87 - 5.11 MIL/uL   Hemoglobin 14.8 12.0 - 15.0 g/dL   HCT 01.6 01.0 - 93.2 %   MCV 86.4 80.0 - 100.0 fL   MCH 27.9 26.0 - 34.0 pg   MCHC 32.2 30.0 - 36.0 g/dL   RDW 35.5 73.2 - 20.2 %   Platelets 606 (H) 150 - 400 K/uL   nRBC 0.0 0.0 - 0.2 %   Neutrophils Relative % 60 %   Lymphocytes Relative 10 %   Monocytes Relative 3 %   Eosinophils Relative 0 %   Basophils Relative 0 %   Band Neutrophils 22 %   Immature Granulocytes 0 %   Metamyelocytes Relative 3 %   Myelocytes 2 %   Promyelocytes Relative 0 %   Blasts 0 %   nRBC 0 0 /100 WBC   Other 0 %   Neutro  Abs 14.5 (H) 1.7 - 7.7 K/uL   Lymphs Abs 1.8 0.7 - 4.0 K/uL   Monocytes Absolute 0.5 0.1 - 1.0 K/uL   Eosinophils Absolute 0.0 0.0 - 0.5 K/uL   Basophils Absolute 0.0 0.0 - 0.1 K/uL   Abs Immature Granulocytes 0.89 (H) 0.00 - 0.07 K/uL   WBC Morphology INCREASED BANDS (>20% BANDS)     Comment: Performed at [facility], [address] [place], [place] [address]  Protime-INR     Status: Abnormal   Collection Time: 10/23/23 11:37 PM  Result Value Ref Range   Prothrombin Time 18.9 (H) 11.4 - 15.2 seconds   INR 1.6 (H) 0.8 - 1.2    Comment: (NOTE) INR goal varies based on device and disease states. Performed at [facility], [address], [place], [place] [address]   APTT     Status: None   Collection Time: 10/23/23 11:37 PM  Result Value Ref Range   aPTT 24 24 - 36 seconds    Comment: Performed at [facility], [address], [place], [place] [address]  Blood Culture (routine x 2)     Status: None (Preliminary result)   Collection Time: 10/23/23 11:37 PM   Specimen: BLOOD  Result Value Ref Range   Specimen Description      BLOOD LEFT ANTECUBITAL Performed at [facility], [address], [place], [place] [address]    Special Requests      BOTTLES DRAWN AEROBIC AND ANAEROBIC Blood Culture results may not be optimal due to an inadequate volume of blood received in culture bottles Performed at [facility], [address], [place], [place] [address]    Culture  Setup Time      GRAM POSITIVE COCCI IN CLUSTERS AEROBIC BOTTLE ONLY CRITICAL RESULT CALLED TO, READ BACK BY AND VERIFIED WITH: PHARMD E JACKSON 10/24/2023 @ 2345 BY AB Performed at [facility], [address], [place], [place] [address]    Culture GRAM POSITIVE COCCI    Report Status PENDING   Troponin I (High Sensitivity)     Status: Abnormal   Collection Time: 10/23/23 11:37 PM  Result Value Ref Range   Troponin I  (High Sensitivity) 27 (H) <18 ng/L    Comment: (NOTE) Elevated high sensitivity troponin I (hsTnI) values and significant  changes across serial measurements may suggest ACS but many other  chronic and acute conditions are known to elevate hsTnI results.  Refer to the "Links" section for chest pain algorithms and additional  guidance. Performed at [facility], [address], [place], [place] [address]   Blood Culture ID Panel (Reflexed)     Status: Abnormal   Collection Time: 10/23/23 11:37 PM  Result Value Ref Range   Enterococcus faecalis NOT DETECTED NOT DETECTED   Enterococcus Faecium NOT DETECTED NOT DETECTED   Listeria monocytogenes NOT DETECTED NOT DETECTED   Staphylococcus species DETECTED (A) NOT DETECTED    Comment: CRITICAL RESULT CALLED TO, READ BACK BY AND VERIFIED WITH: PHARMD E JACKSON 10/24/2023 @ 2345 BY AB    Staphylococcus aureus (BCID) NOT DETECTED NOT DETECTED   Staphylococcus epidermidis DETECTED (A) NOT DETECTED    Comment: Methicillin (oxacillin) resistant coagulase negative staphylococcus. Possible blood culture contaminant (unless isolated from more than one blood culture draw or clinical case suggests pathogenicity). No antibiotic treatment is indicated for blood  culture contaminants. CRITICAL RESULT CALLED TO, READ BACK BY AND VERIFIED WITH: PHARMD E JACKSON 10/24/2023 @ 2345 BY AB    Staphylococcus lugdunensis NOT DETECTED NOT DETECTED   Streptococcus species NOT DETECTED NOT DETECTED   Streptococcus agalactiae NOT DETECTED NOT DETECTED   Streptococcus pneumoniae NOT DETECTED NOT DETECTED   Streptococcus pyogenes NOT DETECTED NOT DETECTED   A.calcoaceticus-baumannii NOT DETECTED NOT DETECTED   Bacteroides fragilis NOT DETECTED NOT DETECTED   Enterobacterales NOT DETECTED NOT DETECTED   Enterobacter cloacae complex NOT DETECTED NOT DETECTED  Escherichia coli NOT DETECTED NOT DETECTED   Klebsiella aerogenes NOT DETECTED NOT DETECTED    Klebsiella oxytoca NOT DETECTED NOT DETECTED   Klebsiella pneumoniae NOT DETECTED NOT DETECTED   Proteus species NOT DETECTED NOT DETECTED   Salmonella species NOT DETECTED NOT DETECTED   Serratia marcescens NOT DETECTED NOT DETECTED   Haemophilus influenzae NOT DETECTED NOT DETECTED   Neisseria meningitidis NOT DETECTED NOT DETECTED   Pseudomonas aeruginosa NOT DETECTED NOT DETECTED   Stenotrophomonas maltophilia NOT DETECTED NOT DETECTED   Candida albicans NOT DETECTED NOT DETECTED   Candida auris NOT DETECTED NOT DETECTED   Candida glabrata NOT DETECTED NOT DETECTED   Candida krusei NOT DETECTED NOT DETECTED   Candida parapsilosis NOT DETECTED NOT DETECTED   Candida tropicalis NOT DETECTED NOT DETECTED   Cryptococcus neoformans/gattii NOT DETECTED NOT DETECTED   Methicillin resistance mecA/C DETECTED (A) NOT DETECTED    Comment: CRITICAL RESULT CALLED TO, READ BACK BY AND VERIFIED WITH: PHARMD E JACKSON 10/24/2023 @ 2345 BY AB Performed at [facility], [address], [place], [place] [address]   I-Stat Lactic Acid, ED     Status: Abnormal   Collection Time: 10/23/23 11:47 PM  Result Value Ref Range   Lactic Acid, Venous 6.0 (HH) 0.5 - 1.9 mmol/L  Blood Culture (routine x 2)     Status: None (Preliminary result)   Collection Time: 10/24/23 12:41 AM   Specimen: BLOOD  Result Value Ref Range   Specimen Description      BLOOD BLOOD RIGHT ARM Performed at [facility], [address], [place], [place] [address]    Special Requests      BOTTLES DRAWN AEROBIC AND ANAEROBIC Blood Culture results may not be optimal due to an inadequate volume of blood received in culture bottles Performed at [facility], [address], [place], [place] [address]    Culture      NO GROWTH 1 DAY Performed at [facility], [address], [place], [place] [address]    Report Status PENDING   Urinalysis, w/ Reflex to Culture (Infection Suspected)  -Urine, Clean Catch     Status: Abnormal   Collection Time: 10/24/23 12:41 AM  Result Value Ref Range   Specimen Source URINE, CLEAN CATCH    Color, Urine YELLOW YELLOW   APPearance HAZY (A) CLEAR   Specific Gravity, Urine 1.026 1.005 - 1.030   pH 5.0 5.0 - 8.0   Glucose, UA NEGATIVE NEGATIVE mg/dL   Hgb urine dipstick NEGATIVE NEGATIVE   Bilirubin Urine NEGATIVE NEGATIVE   Ketones, ur NEGATIVE NEGATIVE mg/dL   Protein, ur 30 (A) NEGATIVE mg/dL   Nitrite NEGATIVE NEGATIVE   Leukocytes,Ua NEGATIVE NEGATIVE   RBC / HPF 0-5 0 - 5 RBC/hpf   WBC, UA 0-5 0 - 5 WBC/hpf    Comment:        Reflex urine culture not performed if WBC <=10, OR if Squamous epithelial cells >5. If Squamous epithelial cells >5 suggest recollection.    Bacteria, UA NONE SEEN NONE SEEN   Squamous Epithelial / HPF 0-5 0 - 5 /HPF    Comment: Performed at [facility], [address] [place], [place] [address]  Troponin I (High Sensitivity)     Status: Abnormal   Collection Time: 10/24/23  1:45 AM  Result Value Ref Range   Troponin I (High Sensitivity) 21 (H) <18 ng/L    Comment: (NOTE) Elevated high sensitivity troponin I (  hsTnI) values and significant  changes across serial measurements may suggest ACS but many other  chronic and acute conditions are known to elevate hsTnI results.  Refer to the "Links" section for chest pain algorithms and additional  guidance. Performed at [facility], [address] [place], [place] [address]   I-Stat Lactic Acid, ED     Status: Abnormal   Collection Time: 10/24/23  1:58 AM  Result Value Ref Range   Lactic Acid, Venous 6.7 (HH) 0.5 - 1.9 mmol/L   Comment NOTIFIED PHYSICIAN   CBG monitoring, ED     Status: Abnormal   Collection Time: 10/24/23  4:09 AM  Result Value Ref Range   Glucose-Capillary 118 (H) 70 - 99 mg/dL    Comment: Glucose reference range applies only to samples taken after fasting for at least 8 hours.   Comment 1  Notify RN   Hemoglobin A1c     Status: Abnormal   Collection Time: 10/24/23  4:10 AM  Result Value Ref Range   Hgb A1c MFr Bld 6.3 (H) 4.8 - 5.6 %    Comment: (NOTE) Pre diabetes:          5.7%-6.4%  Diabetes:              >6.4%  Glycemic control for   <7.0% adults with diabetes    Mean Plasma Glucose 134.11 mg/dL    Comment: Performed at [facility], [address] [place], [place] [address]  Basic metabolic panel     Status: Abnormal   Collection Time: 10/24/23  4:10 AM  Result Value Ref Range   Sodium 130 (L) 135 - 145 mmol/L   Potassium 4.2 3.5 - 5.1 mmol/L   Chloride 98 98 - 111 mmol/L   CO2 20 (L) 22 - 32 mmol/L   Glucose, Bld 140 (H) 70 - 99 mg/dL    Comment: Glucose reference range applies only to samples taken after fasting for at least 8 hours.   BUN 51 (H) 8 - 23 mg/dL   Creatinine, Ser 0.98 0.44 - 1.00 mg/dL   Calcium 8.4 (L) 8.9 - 10.3 mg/dL   GFR, Estimated >11 >91 mL/min    Comment: (NOTE) Calculated using the CKD-EPI Creatinine Equation (2021)    Anion gap 12 5 - 15    Comment: Performed at [facility], [address], [place], [place] [address]  Hepatic function panel     Status: Abnormal   Collection Time: 10/24/23  4:10 AM  Result Value Ref Range   Total Protein 5.8 (L) 6.5 - 8.1 g/dL   Albumin 1.6 (L) 3.5 - 5.0 g/dL   AST 51 (H) 15 - 41 U/L   ALT 30 0 - 44 U/L   Alkaline Phosphatase 86 38 - 126 U/L   Total Bilirubin 0.7 0.0 - 1.2 mg/dL   Bilirubin, Direct 0.3 (H) 0.0 - 0.2 mg/dL   Indirect Bilirubin 0.4 0.3 - 0.9 mg/dL    Comment: Performed at [facility], [address] [place], [place] [address]  CBC     Status: Abnormal   Collection Time: 10/24/23  4:10 AM  Result Value Ref Range   WBC 11.2 (H) 4.0 - 10.5 K/uL   RBC 4.28 3.87 - 5.11 MIL/uL   Hemoglobin 12.1 12.0 - 15.0 g/dL   HCT 08.6 57.8 - 46.9 %   MCV 87.4 80.0 - 100.0 fL   MCH 28.3 26.0 - 34.0 pg   MCHC  32.4 30.0 - 36.0 g/dL   RDW 40.1  02.7 - 25.3 %   Platelets 456 (H) 150 - 400 K/uL   nRBC 0.0 0.0 - 0.2 %    Comment: Performed at [facility], [address], [place], [place] [address]  Lactic acid, plasma     Status: Abnormal   Collection Time: 10/24/23  4:10 AM  Result Value Ref Range   Lactic Acid, Venous 5.9 (HH) 0.5 - 1.9 mmol/L    Comment: CRITICAL RESULT CALLED TO, READ BACK BY AND VERIFIED WITH ESTRADA G. @ 3474 10/24/23 MCLEAN K. Performed at [facility], [address], [place], [place] [address]   CBG monitoring, ED     Status: Abnormal   Collection Time: 10/24/23  7:54 AM  Result Value Ref Range   Glucose-Capillary 115 (H) 70 - 99 mg/dL    Comment: Glucose reference range applies only to samples taken after fasting for at least 8 hours.  MRSA Next Gen by PCR, Nasal     Status: None   Collection Time: 10/24/23  1:02 PM   Specimen: Nasal Mucosa; Nasal Swab  Result Value Ref Range   MRSA by PCR Next Gen NOT DETECTED NOT DETECTED    Comment: (NOTE) The GeneXpert MRSA Assay (FDA approved for NASAL specimens only), is one component of a comprehensive MRSA colonization surveillance program. It is not intended to diagnose MRSA infection nor to guide or monitor treatment for MRSA infections. Test performance is not FDA approved in patients less than 64 years old. Performed at [facility], [address], [place], [place] [address]   Lactic acid, plasma     Status: Abnormal   Collection Time: 10/24/23  1:23 PM  Result Value Ref Range   Lactic Acid, Venous 3.7 (HH) 0.5 - 1.9 mmol/L    Comment: CRITICAL RESULT CALLED TO, READ BACK BY AND VERIFIED WITH Gretel Acre, RN ON 10/24/2023 AT 1415 BY SL Performed at [facility], [address], [place], [place] [address]   Aerobic/Anaerobic Culture w Gram Stain (surgical/deep wound)     Status: None (Preliminary result)   Collection Time: 10/24/23  4:22 PM   Specimen: Abscess  Result  Value Ref Range   Specimen Description      ABSCESS DRAIN Performed at [facility], [address], [place], [place] [address]    Special Requests      NONE Performed at [facility], [address] [place], [place] [address]    Gram Stain      FEW WBC PRESENT, PREDOMINANTLY PMN ABUNDANT GRAM POSITIVE COCCI MODERATE GRAM NEGATIVE RODS MODERATE GRAM POSITIVE RODS    Culture      CULTURE REINCUBATED FOR BETTER GROWTH Performed at [facility], [address] [place], [place] [address]    Report Status PENDING   Glucose, capillary     Status: Abnormal   Collection Time: 10/24/23  7:27 PM  Result Value Ref Range   Glucose-Capillary 69 (L) 70 - 99 mg/dL    Comment: Glucose reference range applies only to samples taken after fasting for at least 8 hours.   Comment 1 Notify RN    Comment 2 Document in Chart   Glucose, capillary     Status: None   Collection Time: 10/24/23  7:45 PM  Result Value Ref Range   Glucose-Capillary 88 70 - 99 mg/dL    Comment: Glucose reference range applies only to  samples taken after fasting for at least 8 hours.   Comment 1 Notify RN    Comment 2 Document in Chart   I-STAT, chem 8     Status: Abnormal   Collection Time: 10/24/23  9:07 PM  Result Value Ref Range   Sodium 134 (L) 135 - 145 mmol/L   Potassium 3.6 3.5 - 5.1 mmol/L   Chloride 99 98 - 111 mmol/L   BUN 18 8 - 23 mg/dL   Creatinine, Ser 1.61 (L) 0.44 - 1.00 mg/dL   Glucose, Bld 096 (H) 70 - 99 mg/dL    Comment: Glucose reference range applies only to samples taken after fasting for at least 8 hours.   Calcium, Ion 1.24 1.15 - 1.40 mmol/L   TCO2 25 22 - 32 mmol/L   Hemoglobin 9.9 (L) 12.0 - 15.0 g/dL   HCT 04.5 (L) 40.9 - 81.1 %  I-STAT 7, (LYTES, BLD GAS, ICA, H+H)     Status: Abnormal   Collection Time: 10/24/23  9:33 PM  Result Value Ref Range   pH, Arterial 7.435 7.35 - 7.45   pCO2 arterial 32.2 32 - 48 mmHg   pO2, Arterial 192 (H) 83  - 108 mmHg   Bicarbonate 21.6 20.0 - 28.0 mmol/L   TCO2 23 22 - 32 mmol/L   O2 Saturation 100 %   Acid-base deficit 2.0 0.0 - 2.0 mmol/L   Sodium 135 135 - 145 mmol/L   Potassium 3.4 (L) 3.5 - 5.1 mmol/L   Calcium, Ion 1.18 1.15 - 1.40 mmol/L   HCT 28.0 (L) 36.0 - 46.0 %   Hemoglobin 9.5 (L) 12.0 - 15.0 g/dL   Sample type ARTERIAL   Type and screen [facility]     Status: None (Preliminary result)   Collection Time: 10/24/23  9:40 PM  Result Value Ref Range   ABO/RH(D) A POS    Antibody Screen NEG    Sample Expiration 10/27/2023,2359    Unit Number [id number]    Blood Component Type RED CELLS,LR    Unit division 00    Status of Unit ALLOCATED    Transfusion Status OK TO TRANSFUSE    Crossmatch Result      COMPATIBLE Performed at [facility], [address] [place], [place] [address]    Unit Number [id number]    Blood Component Type RED CELLS,LR    Unit division 00    Status of Unit ALLOCATED    Transfusion Status OK TO TRANSFUSE    Crossmatch Result COMPATIBLE   Aerobic/Anaerobic Culture w Gram Stain (surgical/deep wound)     Status: None (Preliminary result)   Collection Time: 10/24/23  9:44 PM   Specimen: Abscess; Wound  Result Value Ref Range   Specimen Description      ABSCESS Performed at [facility], [address], [place], [place] [address]    Special Requests      ABSCESS Performed at [facility], [address] [place], [place] [address]    Gram Stain      FEW WBC PRESENT, PREDOMINANTLY PMN MODERATE GRAM NEGATIVE RODS MODERATE GRAM POSITIVE COCCI Performed at [facility], [address] [place], [place] [address]    Culture PENDING    Report Status PENDING   Aerobic/Anaerobic Culture w Gram Stain (surgical/deep wound)     Status: None (Preliminary result)   Collection Time: 10/24/23  9:47 PM   Specimen: Path Tissue  Result Value Ref Range  Specimen  Description      TISSUE Performed at [facility], [address], [place], [place] [address]    Special Requests      TISSUE Performed at [facility], [address] [place], [place] [address]    Gram Stain      RARE WBC PRESENT, PREDOMINANTLY PMN FEW GRAM POSITIVE COCCI RARE GRAM NEGATIVE RODS Performed at [facility], [address], [place], [place] [address]    Culture PENDING    Report Status PENDING   I-STAT 7, (LYTES, BLD GAS, ICA, H+H)     Status: Abnormal   Collection Time: 10/24/23 10:25 PM  Result Value Ref Range   pH, Arterial 7.422 7.35 - 7.45   pCO2 arterial 33.0 32 - 48 mmHg   pO2, Arterial 194 (H) 83 - 108 mmHg   Bicarbonate 21.5 20.0 - 28.0 mmol/L   TCO2 22 22 - 32 mmol/L   O2 Saturation 100 %   Acid-base deficit 3.0 (H) 0.0 - 2.0 mmol/L   Sodium 135 135 - 145 mmol/L   Potassium 3.1 (L) 3.5 - 5.1 mmol/L   Calcium, Ion 1.16 1.15 - 1.40 mmol/L   HCT 25.0 (L) 36.0 - 46.0 %   Hemoglobin 8.5 (L) 12.0 - 15.0 g/dL   Sample type ARTERIAL   Prepare RBC (crossmatch) INTRAOP ONLY     Status: None   Collection Time: 10/24/23 10:29 PM  Result Value Ref Range   Order Confirmation      ORDER PROCESSED BY BLOOD BANK Performed at [facility], [address] [place], [place] [address]   ABO/Rh     Status: None   Collection Time: 10/24/23 10:34 PM  Result Value Ref Range   ABO/RH(D)      A POS Performed at [facility], [address] [place], [place] [address]   Glucose, capillary     Status: Abnormal   Collection Time: 10/25/23 12:28 AM  Result Value Ref Range   Glucose-Capillary 105 (H) 70 - 99 mg/dL    Comment: Glucose reference range applies only to samples taken after fasting for at least 8 hours.   Comment 1 Notify RN    Comment 2 Document in Chart   Basic metabolic panel     Status: Abnormal   Collection Time: 10/25/23  3:46 AM  Result Value Ref Range   Sodium 132 (L)  135 - 145 mmol/L   Potassium 3.0 (L) 3.5 - 5.1 mmol/L   Chloride 100 98 - 111 mmol/L   CO2 23 22 - 32 mmol/L   Glucose, Bld 142 (H) 70 - 99 mg/dL    Comment: Glucose reference range applies only to samples taken after fasting for at least 8 hours.   BUN 17 8 - 23 mg/dL   Creatinine, Ser 7.84 0.44 - 1.00 mg/dL   Calcium 7.9 (L) 8.9 - 10.3 mg/dL   GFR, Estimated >69 >62 mL/min    Comment: (NOTE) Calculated using the CKD-EPI Creatinine Equation (2021)    Anion gap 9 5 - 15    Comment: Performed at [facility], [address] [place], [place] [address]  CBC     Status: Abnormal   Collection Time: 10/25/23  3:46 AM  Result Value Ref Range   WBC 6.6 4.0 - 10.5 K/uL   RBC 2.74 (L) 3.87 - 5.11 MIL/uL   Hemoglobin 7.8 (L) 12.0 - 15.0 g/dL    Comment: REPEATED TO  VERIFY   HCT 23.7 (L) 36.0 - 46.0 %   MCV 86.5 80.0 - 100.0 fL   MCH 28.5 26.0 - 34.0 pg   MCHC 32.9 30.0 - 36.0 g/dL   RDW 08.6 57.8 - 46.9 %   Platelets 279 150 - 400 K/uL   nRBC 0.0 0.0 - 0.2 %    Comment: Performed at [facility], [address], [place], [place] [address]  Phosphorus     Status: Abnormal   Collection Time: 10/25/23  3:46 AM  Result Value Ref Range   Phosphorus 2.0 (L) 2.5 - 4.6 mg/dL    Comment: Performed at [facility], [address], [place], [place] [address]  Magnesium     Status: None   Collection Time: 10/25/23  3:46 AM  Result Value Ref Range   Magnesium 1.8 1.7 - 2.4 mg/dL    Comment: Performed at [facility], [address] [place], [place] [address]  Fibrinogen     Status: Abnormal   Collection Time: 10/25/23  3:46 AM  Result Value Ref Range   Fibrinogen 553 (H) 210 - 475 mg/dL    Comment: (NOTE) Fibrinogen results may be underestimated in patients receiving thrombolytic therapy. Performed at [facility], [address], [place], [place] [address]   Lactic acid, plasma     Status:  Abnormal   Collection Time: 10/25/23  3:46 AM  Result Value Ref Range   Lactic Acid, Venous 3.1 (HH) 0.5 - 1.9 mmol/L    Comment: CRITICAL VALUE NOTED. VALUE IS CONSISTENT WITH PREVIOUSLY REPORTED/CALLED VALUE Performed at [facility], [address], [place], [place] [address]   Glucose, capillary     Status: Abnormal   Collection Time: 10/25/23  3:54 AM  Result Value Ref Range   Glucose-Capillary 123 (H) 70 - 99 mg/dL    Comment: Glucose reference range applies only to samples taken after fasting for at least 8 hours.  Glucose, capillary     Status: None   Collection Time: 10/25/23  7:40 AM  Result Value Ref Range   Glucose-Capillary 96 70 - 99 mg/dL    Comment: Glucose reference range applies only to samples taken after fasting for at least 8 hours.  HIV Antibody (routine testing w rflx)     Status: None   Collection Time: 10/25/23  9:18 AM  Result Value Ref Range   HIV Screen 4th Generation wRfx Non Reactive Non Reactive    Comment: Performed at [facility], [address], [place], [place] [address]  Lactic acid, plasma     Status: Abnormal   Collection Time: 10/25/23  9:18 AM  Result Value Ref Range   Lactic Acid, Venous 2.6 (HH) 0.5 - 1.9 mmol/L    Comment: CRITICAL RESULT CALLED TO, READ BACK BY AND VERIFIED WITH Terrilee Croak, J RN @ 1108 ON 10/25/23 CAL Performed at [facility], [address], [place], [place] [address]   Glucose, capillary     Status: None   Collection Time: 10/25/23 12:02 PM  Result Value Ref Range   Glucose-Capillary 78 70 - 99 mg/dL    Comment: Glucose reference range applies only to samples taken after fasting for at least 8 hours.  Glucose, capillary     Status: Abnormal   Collection Time: 10/25/23 12:41 PM  Result Value Ref Range   Glucose-Capillary 65 (L) 70 - 99 mg/dL    Comment: Glucose reference range applies only  to samples taken after fasting for at least 8 hours.  Glucose, capillary     Status:  None   Collection Time: 10/25/23  1:28 PM  Result Value Ref Range   Glucose-Capillary 78 70 - 99 mg/dL    Comment: Glucose reference range applies only to samples taken after fasting for at least 8 hours.  Glucose, capillary     Status: None   Collection Time: 10/25/23  3:08 PM  Result Value Ref Range   Glucose-Capillary 97 70 - 99 mg/dL    Comment: Glucose reference range applies only to samples taken after fasting for at least 8 hours.    MICRO:  IMAGING: DG Chest 1 View Result Date: 10/24/2023 CLINICAL DATA:  Dyspnea.  Central line placement. EXAM: CHEST  1 VIEW COMPARISON:  Radiographs yesterday, chest CT earlier today FINDINGS: Left internal jugular central venous catheter tip overlies the right atrium, partially obscured by overlying monitoring devices. No pneumothorax. Overall low lung volumes. No focal airspace disease, large pleural effusion or pulmonary edema. The heart is normal in size. IMPRESSION: Left internal jugular central venous catheter tip overlies the right atrium. No pneumothorax. Electronically Signed   By: Narda Rutherford M.D.   On: 10/24/2023 20:47   CT GUIDED PERITONEAL/RETROPERITONEAL FLUID DRAIN BY PERC CATH Result Date: 10/24/2023 CLINICAL DATA:  Diverticular abscess EXAM: CT GUIDED DRAINAGE OF PELVIC ABSCESS ANESTHESIA/SEDATION: Intravenous Fentanyl and Versed 1mg  were administered by RN during a total moderate (conscious) sedation time of 20 minutes; the patient's level of consciousness and physiological / cardiorespiratory status were monitored continuously by radiology RN under my direct supervision. PROCEDURE: The procedure, risks, benefits, and alternatives were explained to the patient. Questions regarding the procedure were encouraged and answered. The patient understands and consents to the procedure. select axial scans through the pelvis were obtained. the complex left pelvic fluid collection abutting the urinary bladder was localized and an  appropriate skin entry site was determined and marked. The operative field was prepped with chlorhexidinein a sterile fashion, and a sterile drape was applied covering the operative field. A sterile gown and sterile gloves were used for the procedure. Local anesthesia was provided with 1% Lidocaine. Under CT fluoroscopic guidance, 18 gauge trocar needle advanced to the collection. Amplatz guidewire advanced easily within the collection, confirmed on CT fluoroscopy. Tract dilated to facilitate placement of a 12 French pigtail drain catheter, formed centrally within the collection. A 10 mL of feculent material were aspirated, sent for Gram stain and culture. Catheter secured externally 0 Prolene sutures and StatLock and placed to gravity drain bag. The patient tolerated the procedure well. RADIATION DOSE REDUCTION: This exam was performed according to the departmental dose-optimization program which includes automated exposure control, adjustment of the mA and/or kV according to patient size and/or use of iterative reconstruction technique. COMPLICATIONS: None immediate FINDINGS: Complex gas and debris filled collection in the low left pelvis abutting the urinary bladder is again localized. Deep gas in the fascial planes of the proximal left thigh also noted, distal extent not visualized. 12 French pigtail drain catheter placed as above. Feculent aspirate sent for Gram stain and culture. IMPRESSION: 1. Technically successful CT-guided pelvic abscess drain catheter placement. Feculent aspirate sent for Gram stain and culture. 2. Gas bubbles dissecting through deep muscular and fascial planes of the proximal left thigh. This finding was telephoned to Dr. Natale Milch at the beginning of the procedure. Electronically Signed   By: Corlis Leak M.D.   On: 10/24/2023 19:22   CT  EXTREMITY LOWER LEFT WO CONTRAST Result Date: 10/24/2023 CLINICAL DATA:  Recent drain placement and abdomen. Scan due to pain, swelling, and  subcutaneous free air. Evaluate for necrotizing fasciitis. EXAM: CT OF THE LOWER LEFT EXTREMITY WITHOUT CONTRAST TECHNIQUE: Multidetector CT imaging of the lower left extremity was performed according to the standard protocol. RADIATION DOSE REDUCTION: This exam was performed according to the departmental dose-optimization program which includes automated exposure control, adjustment of the mA and/or kV according to patient size and/or use of iterative reconstruction technique. COMPARISON:  CT chest, abdomen, and pelvis 10/24/2023 FINDINGS: Bones/Joint/Cartilage No cortical erosion is seen.  No acute fracture. There are individual screws within the distal aspect of the first and fifth metatarsals. Ligaments Suboptimally assessed by CT. Muscles and Tendons There is again air seen tracking along the sartorius and the left groin musculature as on CT of the chest, abdomen, and pelvis earlier today. There is extension of this air within the fascial planes surrounding these muscles throughout the more distal thigh, and air is seen around fascial planes within the sartorius, anterior left quadriceps, obturator externus, adductor longus, brevis, and magnus. Within the mid to distal thigh air surrounds the hamstring fascial planes diffusely. Within the calf, air surrounds the fascial planes of the medial greater than lateral heads of the gastrocnemius muscles to the level of the ankle. Findings are concerning for necrotizing fasciitis diffusely throughout the left lower leg. Within the thigh, although the anterior superficial portion of the quadriceps musculature appears affected, and the deep fascial planes of the quadriceps musculature do not demonstrate air. Within the calf, the anterior compartment and deep posterior compartment appear less affected. Soft tissues There is moderate diffuse subcutaneous fat edema and swelling throughout the left thigh and calf. There is density likely representing confluent edema lateral  to the knee (axial series 6, image 319 and coronal series 9, image 118). No walled-off abscess is seen. There is a new anterior left pelvic approach drainage catheter terminating within the region of the complex collection containing air and fluid within left hemipelvis on CT chest, abdomen, pelvis earlier today. That suspected abscess adjacent to the wall thickening within the sigmoid colon is markedly decreased from prior. A Foley catheter is seen within the urinary bladder which contains contrast. IMPRESSION: 1. There is extensive air tracking along the fascial planes of the left hip, groin, thigh, and posterior calf musculature concerning for diffuse necrotizing fasciitis extending down to the level of the ankle. 2. There is moderate diffuse subcutaneous fat edema and swelling throughout the left thigh and calf. There is density likely representing confluent edema lateral to the knee. No walled-off abscess is seen. 3. There is a new anterior left pelvic approach drainage catheter terminating within the region of the complex collection containing air and fluid within left hemipelvis on CT chest, abdomen, pelvis earlier today. That suspected abscess adjacent to the wall thickening within the sigmoid colon is markedly decreased from prior. Critical Value/emergent results were called by telephone at the time of interpretation on 10/24/2023 at 6:12 pm to provider Carma Leaven , who verbally acknowledged these results. Electronically Signed   By: Neita Garnet M.D.   On: 10/24/2023 18:15   CT Angio Chest/Abd/Pel for Dissection W and/or Wo Contrast Result Date: 10/24/2023 CLINICAL DATA:  Septic arterial embolism. Left leg pain and swelling. History of liver abscess drained on 08/20/2024. EXAM: CT ANGIOGRAPHY CHEST, ABDOMEN AND PELVIS TECHNIQUE: Non-contrast CT of the chest was initially obtained. Multidetector CT imaging through the chest,  abdomen and pelvis was performed using the standard protocol during bolus  administration of intravenous contrast. Multiplanar reconstructed images and MIPs were obtained and reviewed to evaluate the vascular anatomy. RADIATION DOSE REDUCTION: This exam was performed according to the departmental dose-optimization program which includes automated exposure control, adjustment of the mA and/or kV according to patient size and/or use of iterative reconstruction technique. CONTRAST:  OMNIPAQUE IOHEXOL 350 MG/ML SOLN COMPARISON:  CT abdomen 10/21/2023. CT abdomen and pelvis 09/14/2023. FINDINGS: CTA CHEST FINDINGS Cardiovascular: Preferential opacification of the thoracic aorta. No evidence of thoracic aortic aneurysm or dissection. Normal heart size. No pericardial effusion. There is calcified atherosclerotic disease throughout the aorta. Bovine arch anatomy is noted. Mediastinum/Nodes: No enlarged mediastinal, hilar, or axillary lymph nodes. Thyroid gland, trachea, and esophagus demonstrate no significant findings. Lungs/Pleura: Are minimal atelectatic changes in the bilateral lower lobes and lingula. There is no pleural effusion or pneumothorax. Musculoskeletal: No chest wall abnormality. No acute or significant osseous findings. Review of the MIP images confirms the above findings. CTA ABDOMEN AND PELVIS FINDINGS VASCULAR Aorta: Normal caliber aorta without aneurysm, dissection, vasculitis or significant stenosis. There is calcified atherosclerotic disease throughout the aorta. 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 Otherwise, the celiac artery and its branches appear within normal limits. SMA: Patent without evidence of aneurysm, dissection, vasculitis or significant stenosis. Renals: There is mild-to-moderate focal stenosis of the origin of the left renal artery secondary to atherosclerotic disease. Renal arteries are otherwise within normal limits. IMA: Patent without evidence of aneurysm, dissection, vasculitis or  significant stenosis. Inflow: Patent without evidence of aneurysm, dissection, vasculitis or significant stenosis. Calcified atherosclerosis is present. Other: There is moderate focal stenosis in the proximal left superficial femoral artery. There severe focal stenosis and possible focal thrombosis in the proximal right profunda femoral artery. Veins: No obvious venous abnormality within the limitations of this arterial phase study. Review of the MIP images confirms the above findings. NON-VASCULAR Hepatobiliary: Percutaneous drainage catheter has been removed. Ill-defined heterogeneous hypodensity is again seen in the central liver measuring proximally 5.9 x 3.8 cm similar to the prior examination. Rounded hypodensity in the dome of the liver is unchanged and favored as a cyst. Gallbladder is surgically absent. There stable common bile duct dilatation. Pancreas: Unremarkable. No pancreatic ductal dilatation or surrounding inflammatory changes. Spleen: Normal in size without focal abnormality. Adrenals/Urinary Tract: There some bilateral adrenal thickening which is new from prior. There is no hydronephrosis or perinephric stranding. There is a cyst in the superior pole the right kidney measuring 1.7 cm. Bladder is within normal limits. Stomach/Bowel: There is no evidence for bowel obstruction, pneumatosis or free air. There is diffuse colonic diverticulosis. There is a large amount of stool throughout the colon. The appendix is not seen. There is wall thickening of the mid sigmoid colon without surrounding inflammatory stranding. There is a new collection containing presumed fecal matter and air in the left lower quadrant measuring 6.8 x 6.2 by 6.9 cm. This is inferior to and abutting the sigmoid colon without surrounding inflammation. Small bowel and stomach are within normal limits. Lymphatic: No enlarged lymph nodes are identified. Reproductive: Small calcified fibroids are present. Adnexa are within normal  limits. Other: No abdominal wall hernia or abnormality. No abdominopelvic ascites. Musculoskeletal: No acute osseous abnormality. There is soft tissue gas and edema within the abductor thigh musculature and anterior thigh musculature. Review of the MIP images confirms the above findings. IMPRESSION:  VASCULAR 1. No evidence for aortic dissection or aneurysm. 2. 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 3. Moderate focal stenosis in the proximal left superficial femoral artery. 4. Severe focal stenosis and possible focal thrombosis in the proximal right profunda femoral artery. Aortic Atherosclerosis (ICD10-I70.0). NON_VASCULAR 1. New 6.9 cm collection containing presumed fecal matter and air in the left lower quadrant abutting the sigmoid colon. Findings may represent a contained perforation of diverticulum with abscess formation. Please correlate clinically. 2. Wall thickening of the mid sigmoid colon without surrounding inflammatory stranding. Findings may be related to colitis/diverticulitis. Underlying lesion can not be excluded. 3. Large amount of stool throughout the colon. 4. Soft tissue gas and edema within the left abductor thigh musculature and anterior thigh musculature worrisome for infection/myositis/fasciitis including necrotizing fasciitis. 5. Stable ill-defined hypodensity in the central liver favored as abscess. 6. New bilateral adrenal thickening. These results were called by telephone at the time of interpretation on 10/24/2023 at 1:38 am to provider [facility] , who verbally acknowledged these results. Electronically Signed   By: Darliss Cheney M.D.   On: 10/24/2023 01:41   DG Chest Port 1 View Result Date: 10/23/2023 CLINICAL DATA:  Questionable sepsis EXAM: PORTABLE CHEST 1 VIEW COMPARISON:  Chest x-ray 09/14/2023 FINDINGS: The heart size and mediastinal contours are within normal limits. Both lungs are clear. The visualized skeletal structures are unremarkable. IMPRESSION:  No active disease. Electronically Signed   By: Darliss Cheney M.D.   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 On admit she is septic with lactic acidosis work up showing intra abdominal abscess possible perforated diverticulits as well as infection tracking down left leg causing necrotizing fasciitis/compartment syndrome s/p I x D last night of left leg, and IR placed drain into LLQ  -- patient having ongoing fevers, now tachycardic with hypotension - concern that she doesn't have source control with just IR drain in abdomen. Recommend for surgery to re-evaluate for irrigation of intra-abdominal abscess - ortho continues to monitor patient, likely need repeat I X D in the setting of necrotizing fascitiis  - changing abtx to piptazo, plus linezolid plus micafungin. Will stop clinda and vanco ( linezolid will provide mrsa coverage ) - MRSE on 1 of 4 blood cx - possible contaminant.. unlikely to be source of intra=abdominal infection.  Hx of hepatic abscess = once clinically stable have IR re-evaluate if can do aspirate of fluid to see if can decrease size of abscess  Severe protein calorie malnutrition = once stable to have oral intake, have nutritionist eval for calorie supplentation.  I have personally spent 82 minutes involved in face-to-face and non-face-to-face activities for this patient on the day of the visit. Professional time spent includes the following activities: Preparing to see the patient (review of tests), Obtaining and/or reviewing separately obtained history (admission/discharge record), Performing a medically appropriate examination and/or evaluation , Ordering medications/tests/procedures, referring and communicating with other health care professionals, Documenting clinical information in  the EMR, Independently interpreting results (not separately reported), Communicating results to the patient/family/caregiver, Counseling and educating the patient/family/caregiver and Care coordination (not separately reported).    evaluation of this patient requires complex antimicrobial therapy evaluation and counseling and isolation needs for disease transmission risk assessment and mitigation.

## 2023-10-25 NOTE — Progress Notes (Addendum)
Initial Nutrition Assessment  DOCUMENTATION CODES:   Severe malnutrition in context of acute illness/injury  INTERVENTION:  Advance diet as medically applicable and tolerated When diet advanced Depending on diet either Boost Breeze po TID, each supplement provides 250 kcal and 9 grams of protein (clear liquid diet) Ensure Plus High Protein po BID, each supplement provides 350 kcal and 20 grams of protein. (Full liquid -regular) Multivitamin/minerals.    NUTRITION DIAGNOSIS:   Severe Malnutrition related to acute illness as evidenced by NPO status, percent weight loss (12% in less than 30 days).    GOAL:   Patient will meet greater than or equal to 90% of their needs    MONITOR:   Diet advancement, Labs  REASON FOR ASSESSMENT:   Consult Assessment of nutrition requirement/status, Calorie Count  ASSESSMENT:  73 y.o. f, Presents to ED from home with complaints of worsening left thigh pain and swelling. Recent Streptococcus intermedius bacteremia, hepatic abscess and diverticulitis who completed antibiotics. Principal admitting problem diverticulitis of large intestine with abscess. PMH: Diverticulitis, TIA, HLD, Colon polyps. Review of EMR revealed: PT currently NPO related to recent procedure and not alert enough to safely eat. 12% weight loss in less than 30 days. Suspect patient meet criteria for malnutrition, due to second hospital admission in 1 month time,  inadequate oral intake, ad weight loss.  Consulted for Calorie count suspect that this is not appropriate at this time due to NPO status.   1/25-  Excisional debridement of subcutaneous tissue, fascia, and muscle left lower extremity. 2.  Placement of left lower extremity wound VAC. Admit weight: 75.5 kg Weight history:  10/24/23 75.5 kg  10/08/23 86.2 kg  09/15/23 84.4 kg    Average Meal Intake: NPO  Nutritionally Relevant Medications: Continuous Infusions:  epinephrine     famotidine (PEPCID) IV      [START ON 10/26/2023] vancomycin      PRN Meds:.acetaminophen **OR** acetaminophen, HYDROmorphone (DILAUDID) injection, ipratropium-albuterol, ondansetron **OR** ondansetron (ZOFRAN) IV, mouth rinse, oxyCODONE  Labs Reviewed:          Component Ref Range & Units (hover) 03:46 (10/25/23) 1 d ago (10/24/23) 1 d ago (10/24/23) 1 d ago (10/24/23) 1 d ago (10/24/23) 2 d ago (10/23/23) 1 mo ago (09/19/23)  Sodium 132 Low  135 135 134 Low  130 Low  131 Low  136  Potassium 3.0 Low  3.1 Low  3.4 Low  3.6 4.2 5.2 High          NUTRITION - FOCUSED PHYSICAL EXAM:  Deferred   Diet Order:   Diet Order             Diet NPO time specified Except for: Sips with Meds  Diet effective now                   EDUCATION NEEDS:   Not appropriate for education at this time  Skin:  Skin Assessment: Reviewed RN Assessment  Last BM:  PTA  Height:   Ht Readings from Last 1 Encounters:  10/24/23 5\' 5"  (1.651 m)    Weight:   Wt Readings from Last 1 Encounters:  10/24/23 75.5 kg    Ideal Body Weight:     BMI:  Body mass index is 27.7 kg/m.  Estimated Nutritional Needs:   Kcal:  1900-2300 kcal  Protein:  100-125 g  Fluid:  64ml/kcal    Jamelle Haring RDN, LDN Clinical Dietitian   If unable to reach, please contact "[facility] Inpatient" secure chat group  between 8 am-4 pm daily"

## 2023-10-25 NOTE — Progress Notes (Signed)
NAME:  Ashley Galloway, MRN:  [id number], DOB:  [birth year], LOS: 1 ADMISSION DATE:  10/23/2023, CONSULTATION DATE:  10/24/2023 REFERRING MD: Azucena Fallen, MD, CHIEF COMPLAINT: Septic shock since 4 pm   History of Present Illness:  A 73 y.o. female with HTN, dyslipidemia, PVD, COPD, tobacco smoking (quit few months ago), recent Streptococcus intermedius bacteremia, hepatic abscess (percutaneous drain d/c on 10/20/2022), and diverticulitis in Dec 2024 who completed antibiotics few days ago. She presented to ER 10/24/2023 in am with c/o LLQ swelling and erythema, and left thigh pain for few days. She has fever, chills, general malaise and loss of appetite, but denies chest pain, cough, SOB, wheezing, or N/V/D. She reports constipation.  New CT imaging finds new large LLQ abscess with associated diverticulitis as well as fluid/air into abductor muscle concerning for myositis.  Received 4.5 L LR and on her 5th L of LR boluses Started on IV abx: Vancomycin, Cefepime, and Flagyl--->Linezolid, Zosyn and Clindamycin. General surgery consulted and request IR, s/p perc drain to abdominal abscess. Developed Afib in ICU, new    Pertinent  Medical History  HTN, dyslipidemia, PVD, COPD, tobacco smoking (quit few months ago), recent Streptococcus intermedius bacteremia, hepatic abscess (percutaneous drain d/c on 10/20/2022), diverticulitis in Dec 2024  Significant Hospital Events: Including procedures, antibiotic start and stop dates in addition to other pertinent events   1/25 to OR for debridement of necrotizing fasciitis  Interim History / Subjective:   On 1-2 mcg norepi this morning. Lethargic, husband at bedside  Objective   Blood pressure 102/75, pulse 77, temperature 97.7 F (36.5 C), temperature source Oral, resp. rate 11, height 5\' 5"  (1.651 m), weight 75.5 kg, SpO2 100%.        Intake/Output Summary (Last 24 hours) at 10/25/2023 0751 Last data filed at 10/25/2023 0732 Gross  per 24 hour  Intake 5888.82 ml  Output 3350 ml  Net 2538.82 ml   Filed Weights   10/23/23 2328 10/24/23 1415  Weight: 71.3 kg 75.5 kg    Examination: Laying flat, somnolent but arouses to voice and touch Does not awaken enough to follow commands Breathing is non labored on nasal cannula Abdomen soft, JP drain with feculent bloody drainage Left leg is wrapped in dressing, distal extremities warm and well perfused  Leg wound cultures from OR - GPC, GNR BCID - positive for staph and strep  Resolved Hospital Problem list   AKI  Assessment & Plan:  Septic shock due to intra-abdominal abscess/diverticulitis and left thigh necrotizing fascitis.  S/p CT guided drain placement 1/25 for abdominal abscess S/p excision and debridement of LLE with wound vac placement 1/25 Staph/strepBacteremia Complicated by recent strep intermedius bacteremia, hepatic abscess (drain was d/c 10/21/2023), and chronic diverticulosis. Finished PO Abx few days ago - titrate down vasopressors goal MAP>65 -General Surgery, ortho, IR following -Levophed for MAP>65 mmHg -check HIV Ab  New Afib due to sepsis 10/05/2023: Echo EF 60%, G1DD Optimize electroltes K>4, Mg>2  Acute metabolic encephalopathy  -Secondary to above - limit sedating meds  COPD -Duoneb PRN   HTN and dyslipidemia -Hold home meds  Moderate PCM -Dietitian consult  Mild hypoNa -Monitor Na  Hyperglycemia due to sepsis Pre-DM (HbA1c 6.3%) - ISS - Glycemic control   Elevated INR: recent IV and PO Abx and sepsis now -Fibrogen am   Anxiety: well-controlled  Lumbar stenosis, no current complaints  Peripheral arterial disease, chronic  Best Practice (right click and "Reselect all SmartList Selections" daily)   Diet/type: NPO DVT  prophylaxis prophylactic heparin  Pressure ulcer(s): N/A GI prophylaxis: H2B Lines: Central line Foley:  Yes, and it is still needed Code Status:  full code Last date of multidisciplinary goals of  care discussion [husband updated at bedside 1/26 am]   Critical care time:     The patient is critically ill due to septic shock.  Critical care was necessary to treat or prevent imminent or life-threatening deterioration.  Critical care was time spent personally by me on the following activities: development of treatment plan with patient and/or surrogate as well as nursing, discussions with consultants, evaluation of patient's response to treatment, examination of patient, obtaining history from patient or surrogate, ordering and performing treatments and interventions, ordering and review of laboratory studies, ordering and review of radiographic studies, pulse oximetry, re-evaluation of patient's condition and participation in multidisciplinary rounds.   Critical Care Time devoted to patient care services described in this note is 40 minutes. This time reflects time of care of this signee Charlott Holler . This critical care time does not reflect separately billable procedures or procedure time, teaching time or supervisory time of PA/NP/Med student/Med Resident etc but could involve care discussion time.       Mickel Baas Pulmonary and Critical Care Medicine 10/25/2023 8:49 AM  Pager: see AMION  If no response to pager , please call critical care on call (see AMION) until 7pm After 7:00 pm call Elink

## 2023-10-25 NOTE — Progress Notes (Signed)
Subjective: 1 Day Post-Op Procedure(s) (LRB): IRRIGATION AND DEBRIDEMENT EXTREMITY Left lower extremity (Left) Patient extubated and lying in bed in NAD Pressor requirements decreasing according to CCM  Objective: Vital signs in last 24 hours: Temp:  [97.5 F (36.4 C)-101.2 F (38.4 C)] 97.7 F (36.5 C) (01/26 0400) Pulse Rate:  [77-95] 77 (01/25 2328) Resp:  [10-31] 11 (01/26 0455) BP: (80-115)/(29-75) 102/75 (01/26 0000) SpO2:  [97 %-100 %] 100 % (01/25 2358) Arterial Line BP: (99-126)/(51-62) 124/61 (01/25 2358) Weight:  [75.5 kg] 75.5 kg (01/25 1415)  Intake/Output from previous day: 01/25 0701 - 01/26 0700 In: 5710.8 [I.V.:4081.6; IV Piggyback:1629.3] Out: 3350 [Urine:3150; Blood:200] Intake/Output this shift: Total I/O In: 178 [I.V.:80.8; IV Piggyback:97.2] Out: -   Recent Labs    10/24/23 0410 10/24/23 2107 10/24/23 2133 10/24/23 2225 10/25/23 0346  HGB 12.1 9.9* 9.5* 8.5* 7.8*   Recent Labs    10/24/23 0410 10/24/23 2107 10/24/23 2225 10/25/23 0346  WBC 11.2*  --   --  6.6  RBC 4.28  --   --  2.74*  HCT 37.4   < > 25.0* 23.7*  PLT 456*  --   --  279   < > = values in this interval not displayed.   Recent Labs    10/24/23 0410 10/24/23 2107 10/24/23 2133 10/24/23 2225 10/25/23 0346  NA 130* 134*   < > 135 132*  K 4.2 3.6   < > 3.1* 3.0*  CL 98 99  --   --  100  CO2 20*  --   --   --  23  BUN 51* 18  --   --  17  CREATININE 0.98 0.40*  --   --  0.53  GLUCOSE 140* 109*  --   --  142*  CALCIUM 8.4*  --   --   --  7.9*   < > = values in this interval not displayed.   Recent Labs    10/23/23 2337  INR 1.6*    Left leg in Ace wrap bandage entire leg; toes pink and warm. VAC functioning with 500 ml output since surgery   Assessment/Plan: 1 Day Post-Op Procedure(s) (LRB): IRRIGATION AND DEBRIDEMENT EXTREMITY Left lower extremity (Left)   Continue IV antibiotics and wound VAC No current need for further surgical intervention  today Further surgical plans per Dr Lennette Bihari Yoandri Congrove 10/25/2023, 8:45 AM

## 2023-10-25 NOTE — Progress Notes (Signed)
Hypoglycemic Protocol  Noon CBG was 78.  Patient is NPO and lethargic, unable to eat or drink anything.  Rechecked at 1230, CBG was 64.  Gave 25 mL of D50 IV ampule.  Rechecked CBG at 1325, 78.  Let MD know of situation.  MD ordered D5LR continuous at 50 ml/hr.  CBG at 1430 was 98.  Odis Luster, RN

## 2023-10-25 NOTE — Progress Notes (Signed)
Progress Update:  Patient hypotensive and febrile in the afternoon.  Treated with Tylenol and IV fluid bolus.  Not on vasopressor support.  Concern now is ongoing encephalopathy.  Her abdominal exam is still soft without peritoneal signs and she has a JP drain in place and the intra-abdominal abscess draining feculent drainage.  Her distal pulses are palpable on her left lower extremity.  I discussed case over the phone with Dr. Maisie Fus with general surgery who says that there are no operative options available to the patient.  They recommend if the drain is not functioning appropriately to reconsult IR for another drain placement.  The JP drain is still draining.  ID consulted and antibiotics broadened.  Additional cc time 35 minutes  Durel Salts, MD Pulmonary and Critical Care Medicine [facility] 10/25/2023 5:28 PM Pager: see AMION  If no response to pager, please call critical care on call (see AMION) until 7pm After 7:00 pm call Elink

## 2023-10-25 NOTE — Progress Notes (Signed)
[facility] ADULT ICU REPLACEMENT PROTOCOL   The patient does apply for the [facility] Adult ICU Electrolyte Replacment Protocol based on the criteria listed below:   1.Exclusion criteria: TCTS, ECMO, Dialysis, and Myasthenia Gravis patients 2. Is GFR >/= 30 ml/min? Yes.    Patient's GFR today is > 60 3. Is SCr </= 2? Yes.   Patient's SCr is 0.53 mg/dL 4. Did SCr increase >/= 0.5 in 24 hours? No. 5.Pt's weight >40kg  Yes.   6. Abnormal electrolyte(s): Phos 2.0, K+ 3.0  7. Electrolytes replaced per protocol 8.  Call MD STAT for K+ </= 2.5, Phos </= 1, or Mag </= 1 Physician:  Dr Cory Roughen, Jettie Booze 10/25/2023 5:02 AM

## 2023-10-25 NOTE — Progress Notes (Signed)
1 Day Post-Op   Subjective/Chief Complaint: Pt extubated after OR  Sleepy  Husband at bedside      Objective: Vital signs in last 24 hours: Temp:  [97.5 F (36.4 C)-101.2 F (38.4 C)] 97.7 F (36.5 C) (01/26 0400) Pulse Rate:  [77-95] 77 (01/25 2328) Resp:  [10-31] 11 (01/26 0455) BP: (80-115)/(29-75) 102/75 (01/26 0000) SpO2:  [97 %-100 %] 100 % (01/25 2358) Arterial Line BP: (99-126)/(51-62) 124/61 (01/25 2358) Weight:  [75.5 kg] 75.5 kg (01/25 1415) Last BM Date :  (PTA)  Intake/Output from previous day: 01/25 0701 - 01/26 0700 In: 5710.8 [I.V.:4081.6; IV Piggyback:1629.3] Out: 3350 [Urine:3150; Blood:200] Intake/Output this shift: Total I/O In: 178 [I.V.:80.8; IV Piggyback:97.2] Out: -   Abdomen:soft ND min tenderness no peritonitis  Drain thick red/ brown  Lab Results:  Recent Labs    10/24/23 0410 10/24/23 2107 10/24/23 2225 10/25/23 0346  WBC 11.2*  --   --  6.6  HGB 12.1   < > 8.5* 7.8*  HCT 37.4   < > 25.0* 23.7*  PLT 456*  --   --  279   < > = values in this interval not displayed.   BMET Recent Labs    10/24/23 0410 10/24/23 2107 10/24/23 2133 10/24/23 2225 10/25/23 0346  NA 130* 134*   < > 135 132*  K 4.2 3.6   < > 3.1* 3.0*  CL 98 99  --   --  100  CO2 20*  --   --   --  23  GLUCOSE 140* 109*  --   --  142*  BUN 51* 18  --   --  17  CREATININE 0.98 0.40*  --   --  0.53  CALCIUM 8.4*  --   --   --  7.9*   < > = values in this interval not displayed.   PT/INR Recent Labs    10/23/23 2337  LABPROT 18.9*  INR 1.6*   ABG Recent Labs    10/24/23 2133 10/24/23 2225  PHART 7.435 7.422  HCO3 21.6 21.5    Studies/Results: DG Chest 1 View Result Date: 10/24/2023 CLINICAL DATA:  Dyspnea.  Central line placement. EXAM: CHEST  1 VIEW COMPARISON:  Radiographs yesterday, chest CT earlier today FINDINGS: Left internal jugular central venous catheter tip overlies the right atrium, partially obscured by overlying monitoring devices. No  pneumothorax. Overall low lung volumes. No focal airspace disease, large pleural effusion or pulmonary edema. The heart is normal in size. IMPRESSION: Left internal jugular central venous catheter tip overlies the right atrium. No pneumothorax. Electronically Signed   By: Narda Rutherford M.D.   On: 10/24/2023 20:47   CT GUIDED PERITONEAL/RETROPERITONEAL FLUID DRAIN BY PER[facility] CATH Result Date: 10/24/2023 CLINICAL DATA:  Diverticular abscess EXAM: CT GUIDED DRAINAGE OF PELVIC ABSCESS ANESTHESIA/SEDATION: Intravenous Fentanyl and Versed 1mg  were administered by RN during a total moderate (conscious) sedation time of 20 minutes; the patient's level of consciousness and physiological / cardiorespiratory status were monitored continuously by radiology RN under my direct supervision. PROCEDURE: The procedure, risks, benefits, and alternatives were explained to the patient. Questions regarding the procedure were encouraged and answered. The patient understands and consents to the procedure. select axial scans through the pelvis were obtained. the complex left pelvic fluid collection abutting the urinary bladder was localized and an appropriate skin entry site was determined and marked. The operative field was prepped with chlorhexidinein a sterile fashion, and a sterile drape was applied covering the  operative field. A sterile gown and sterile gloves were used for the procedure. Local anesthesia was provided with 1% Lidocaine. Under CT fluoroscopic guidance, 18 gauge trocar needle advanced to the collection. Amplatz guidewire advanced easily within the collection, confirmed on CT fluoroscopy. Tract dilated to facilitate placement of a 12 French pigtail drain catheter, formed centrally within the collection. A 10 mL of feculent material were aspirated, sent for Gram stain and culture. Catheter secured externally 0 Prolene sutures and StatLock and placed to gravity drain bag. The patient tolerated the procedure well.  RADIATION DOSE REDUCTION: This exam was performed according to the departmental dose-optimization program which includes automated exposure control, adjustment of the mA and/or kV according to patient size and/or use of iterative reconstruction technique. COMPLICATIONS: None immediate FINDINGS: Complex gas and debris filled collection in the low left pelvis abutting the urinary bladder is again localized. Deep gas in the fascial planes of the proximal left thigh also noted, distal extent not visualized. 12 French pigtail drain catheter placed as above. Feculent aspirate sent for Gram stain and culture. IMPRESSION: 1. Technically successful CT-guided pelvic abscess drain catheter placement. Feculent aspirate sent for Gram stain and culture. 2. Gas bubbles dissecting through deep muscular and fascial planes of the proximal left thigh. This finding was telephoned to Dr. Natale Milch at the beginning of the procedure. Electronically Signed   By: Corlis Leak M.D.   On: 10/24/2023 19:22   CT EXTREMITY LOWER LEFT WO CONTRAST Result Date: 10/24/2023 CLINICAL DATA:  Recent drain placement and abdomen. Scan due to pain, swelling, and subcutaneous free air. Evaluate for necrotizing fasciitis. EXAM: CT OF THE LOWER LEFT EXTREMITY WITHOUT CONTRAST TECHNIQUE: Multidetector CT imaging of the lower left extremity was performed according to the standard protocol. RADIATION DOSE REDUCTION: This exam was performed according to the departmental dose-optimization program which includes automated exposure control, adjustment of the mA and/or kV according to patient size and/or use of iterative reconstruction technique. COMPARISON:  CT chest, abdomen, and pelvis 10/24/2023 FINDINGS: Bones/Joint/Cartilage No cortical erosion is seen.  No acute fracture. There are individual screws within the distal aspect of the first and fifth metatarsals. Ligaments Suboptimally assessed by CT. Muscles and Tendons There is again air seen tracking along the  sartorius and the left groin musculature as on CT of the chest, abdomen, and pelvis earlier today. There is extension of this air within the fascial planes surrounding these muscles throughout the more distal thigh, and air is seen around fascial planes within the sartorius, anterior left quadriceps, obturator externus, adductor longus, brevis, and magnus. Within the mid to distal thigh air surrounds the hamstring fascial planes diffusely. Within the calf, air surrounds the fascial planes of the medial greater than lateral heads of the gastrocnemius muscles to the level of the ankle. Findings are concerning for necrotizing fasciitis diffusely throughout the left lower leg. Within the thigh, although the anterior superficial portion of the quadriceps musculature appears affected, and the deep fascial planes of the quadriceps musculature do not demonstrate air. Within the calf, the anterior compartment and deep posterior compartment appear less affected. Soft tissues There is moderate diffuse subcutaneous fat edema and swelling throughout the left thigh and calf. There is density likely representing confluent edema lateral to the knee (axial series 6, image 319 and coronal series 9, image 118). No walled-off abscess is seen. There is a new anterior left pelvic approach drainage catheter terminating within the region of the complex collection containing air and fluid within left hemipelvis on  CT chest, abdomen, pelvis earlier today. That suspected abscess adjacent to the wall thickening within the sigmoid colon is markedly decreased from prior. A Foley catheter is seen within the urinary bladder which contains contrast. IMPRESSION: 1. There is extensive air tracking along the fascial planes of the left hip, groin, thigh, and posterior calf musculature concerning for diffuse necrotizing fasciitis extending down to the level of the ankle. 2. There is moderate diffuse subcutaneous fat edema and swelling throughout the  left thigh and calf. There is density likely representing confluent edema lateral to the knee. No walled-off abscess is seen. 3. There is a new anterior left pelvic approach drainage catheter terminating within the region of the complex collection containing air and fluid within left hemipelvis on CT chest, abdomen, pelvis earlier today. That suspected abscess adjacent to the wall thickening within the sigmoid colon is markedly decreased from prior. Critical Value/emergent results were called by telephone at the time of interpretation on 10/24/2023 at 6:12 pm to provider Carma Leaven , who verbally acknowledged these results. Electronically Signed   By: Neita Garnet M.D.   On: 10/24/2023 18:15   CT Angio Chest/Abd/Pel for Dissection W and/or Wo Contrast Result Date: 10/24/2023 CLINICAL DATA:  Septic arterial embolism. Left leg pain and swelling. History of liver abscess drained on 08/20/2024. EXAM: CT ANGIOGRAPHY CHEST, ABDOMEN AND PELVIS TECHNIQUE: Non-contrast CT of the chest was initially obtained. Multidetector CT imaging through the chest, abdomen and pelvis was performed using the standard protocol during bolus administration of intravenous contrast. Multiplanar reconstructed images and MIPs were obtained and reviewed to evaluate the vascular anatomy. RADIATION DOSE REDUCTION: This exam was performed according to the departmental dose-optimization program which includes automated exposure control, adjustment of the mA and/or kV according to patient size and/or use of iterative reconstruction technique. CONTRAST:  OMNIPAQUE IOHEXOL 350 MG/ML SOLN COMPARISON:  CT abdomen 10/21/2023. CT abdomen and pelvis 09/14/2023. FINDINGS: CTA CHEST FINDINGS Cardiovascular: Preferential opacification of the thoracic aorta. No evidence of thoracic aortic aneurysm or dissection. Normal heart size. No pericardial effusion. There is calcified atherosclerotic disease throughout the aorta. Bovine arch anatomy is  noted. Mediastinum/Nodes: No enlarged mediastinal, hilar, or axillary lymph nodes. Thyroid gland, trachea, and esophagus demonstrate no significant findings. Lungs/Pleura: Are minimal atelectatic changes in the bilateral lower lobes and lingula. There is no pleural effusion or pneumothorax. Musculoskeletal: No chest wall abnormality. No acute or significant osseous findings. Review of the MIP images confirms the above findings. CTA ABDOMEN AND PELVIS FINDINGS VASCULAR Aorta: Normal caliber aorta without aneurysm, dissection, vasculitis or significant stenosis. There is calcified atherosclerotic disease throughout the aorta. 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 Otherwise, the celiac artery and its branches appear within normal limits. SMA: Patent without evidence of aneurysm, dissection, vasculitis or significant stenosis. Renals: There is mild-to-moderate focal stenosis of the origin of the left renal artery secondary to atherosclerotic disease. Renal arteries are otherwise within normal limits. IMA: Patent without evidence of aneurysm, dissection, vasculitis or significant stenosis. Inflow: Patent without evidence of aneurysm, dissection, vasculitis or significant stenosis. Calcified atherosclerosis is present. Other: There is moderate focal stenosis in the proximal left superficial femoral artery. There severe focal stenosis and possible focal thrombosis in the proximal right profunda femoral artery. Veins: No obvious venous abnormality within the limitations of this arterial phase study. Review of the MIP images confirms the above findings. NON-VASCULAR Hepatobiliary: Percutaneous drainage catheter has been removed. Ill-defined heterogeneous hypodensity  is again seen in the central liver measuring proximally 5.9 x 3.8 cm similar to the prior examination. Rounded hypodensity in the dome of the liver is unchanged and favored as a cyst. Gallbladder is  surgically absent. There stable common bile duct dilatation. Pancreas: Unremarkable. No pancreatic ductal dilatation or surrounding inflammatory changes. Spleen: Normal in size without focal abnormality. Adrenals/Urinary Tract: There some bilateral adrenal thickening which is new from prior. There is no hydronephrosis or perinephric stranding. There is a cyst in the superior pole the right kidney measuring 1.7 cm. Bladder is within normal limits. Stomach/Bowel: There is no evidence for bowel obstruction, pneumatosis or free air. There is diffuse colonic diverticulosis. There is a large amount of stool throughout the colon. The appendix is not seen. There is wall thickening of the mid sigmoid colon without surrounding inflammatory stranding. There is a new collection containing presumed fecal matter and air in the left lower quadrant measuring 6.8 x 6.2 by 6.9 cm. This is inferior to and abutting the sigmoid colon without surrounding inflammation. Small bowel and stomach are within normal limits. Lymphatic: No enlarged lymph nodes are identified. Reproductive: Small calcified fibroids are present. Adnexa are within normal limits. Other: No abdominal wall hernia or abnormality. No abdominopelvic ascites. Musculoskeletal: No acute osseous abnormality. There is soft tissue gas and edema within the abductor thigh musculature and anterior thigh musculature. Review of the MIP images confirms the above findings. IMPRESSION: VASCULAR 1. No evidence for aortic dissection or aneurysm. 2. 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 3. Moderate focal stenosis in the proximal left superficial femoral artery. 4. Severe focal stenosis and possible focal thrombosis in the proximal right profunda femoral artery. Aortic Atherosclerosis (ICD10-I70.0). NON_VASCULAR 1. New 6.9 cm collection containing presumed fecal matter and air in the left lower quadrant abutting the sigmoid colon. Findings may represent a contained  perforation of diverticulum with abscess formation. Please correlate clinically. 2. Wall thickening of the mid sigmoid colon without surrounding inflammatory stranding. Findings may be related to colitis/diverticulitis. Underlying lesion can not be excluded. 3. Large amount of stool throughout the colon. 4. Soft tissue gas and edema within the left abductor thigh musculature and anterior thigh musculature worrisome for infection/myositis/fasciitis including necrotizing fasciitis. 5. Stable ill-defined hypodensity in the central liver favored as abscess. 6. New bilateral adrenal thickening. These results were called by telephone at the time of interpretation on 10/24/2023 at 1:38 am to provider [facility] , who verbally acknowledged these results. Electronically Signed   By: Darliss Cheney M.D.   On: 10/24/2023 01:41   DG Chest Port 1 View Result Date: 10/23/2023 CLINICAL DATA:  Questionable sepsis EXAM: PORTABLE CHEST 1 VIEW COMPARISON:  Chest x-ray 09/14/2023 FINDINGS: The heart size and mediastinal contours are within normal limits. Both lungs are clear. The visualized skeletal structures are unremarkable. IMPRESSION: No active disease. Electronically Signed   By: Darliss Cheney M.D.   On: 10/23/2023 23:49    Anti-infectives: Anti-infectives (From admission, onward)    Start     Dose/Rate Route Frequency Ordered Stop   10/26/23 0400  vancomycin (VANCOREADY) IVPB 1500 mg/300 mL        1,500 mg 150 mL/hr over 120 Minutes Intravenous Every 24 hours 10/25/23 0725     10/25/23 0215  vancomycin (VANCOREADY) IVPB 1500 mg/300 mL        1,500 mg 150 mL/hr over 120 Minutes Intravenous  Once 10/25/23 0121 10/25/23 0358   10/24/23 1000  linezolid (ZYVOX) IVPB 600 mg  Status:  Discontinued        600 mg 300 mL/hr over 60 Minutes Intravenous Every 12 hours 10/24/23 0224 10/24/23 2026   10/24/23 0800  piperacillin-tazobactam (ZOSYN) IVPB 3.375 g        3.375 g 12.5 mL/hr over 240 Minutes Intravenous Every 8  hours 10/24/23 0231     10/24/23 0600  clindamycin (CLEOCIN) IVPB 600 mg        600 mg 100 mL/hr over 30 Minutes Intravenous Every 8 hours 10/24/23 0224     10/24/23 0230  piperacillin-tazobactam (ZOSYN) IVPB 3.375 g  Status:  Discontinued       Placed in "And" Linked Group   3.375 g 100 mL/hr over 30 Minutes Intravenous  Once 10/24/23 0224 10/24/23 0229   10/23/23 2345  ceFEPIme (MAXIPIME) 2 g in sodium chloride 0.9 % 100 mL IVPB        2 g 200 mL/hr over 30 Minutes Intravenous  Once 10/23/23 2337 10/24/23 0035   10/23/23 2345  metroNIDAZOLE (FLAGYL) IVPB 500 mg        500 mg 100 mL/hr over 60 Minutes Intravenous  Once 10/23/23 2337 10/24/23 0127   10/23/23 2345  vancomycin (VANCOCIN) IVPB 1000 mg/200 mL premix        1,000 mg 200 mL/hr over 60 Minutes Intravenous  Once 10/23/23 2337 10/24/23 0313       Assessment/Plan: 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

## 2023-10-25 NOTE — Progress Notes (Signed)
PHARMACY - PHYSICIAN COMMUNICATION CRITICAL VALUE ALERT - BLOOD CULTURE IDENTIFICATION (BCID)  Ashley Galloway is an 73 y.o. female who presented to [facility] on 10/23/2023 with a chief complaint of LLQ abscess with associated diverticulitis as well as fluid/air into abductor muscle concerning for myositis.   Assessment:1/4 staph epi, MecA+  Name of physician (or Provider) Contacted: Osei,L  Current antibiotics: clinda, zosyn  Changes to prescribed antibiotics recommended:  Add Vancomycin 1500mg  x 1 then 1500mg  q24h (AUC 531.9, Scr 0.8)  Results for orders placed or performed during the hospital encounter of 10/23/23  Blood Culture ID Panel (Reflexed) (Collected: 10/23/2023 11:37 PM)  Result Value Ref Range   Enterococcus faecalis NOT DETECTED NOT DETECTED   Enterococcus Faecium NOT DETECTED NOT DETECTED   Listeria monocytogenes NOT DETECTED NOT DETECTED   Staphylococcus species DETECTED (A) NOT DETECTED   Staphylococcus aureus (BCID) NOT DETECTED NOT DETECTED   Staphylococcus epidermidis DETECTED (A) NOT DETECTED   Staphylococcus lugdunensis NOT DETECTED NOT DETECTED   Streptococcus species NOT DETECTED NOT DETECTED   Streptococcus agalactiae NOT DETECTED NOT DETECTED   Streptococcus pneumoniae NOT DETECTED NOT DETECTED   Streptococcus pyogenes NOT DETECTED NOT DETECTED   A.calcoaceticus-baumannii NOT DETECTED NOT DETECTED   Bacteroides fragilis NOT DETECTED NOT DETECTED   Enterobacterales NOT DETECTED NOT DETECTED   Enterobacter cloacae complex NOT DETECTED NOT DETECTED   Escherichia coli NOT DETECTED NOT DETECTED   Klebsiella aerogenes NOT DETECTED NOT DETECTED   Klebsiella oxytoca NOT DETECTED NOT DETECTED   Klebsiella pneumoniae NOT DETECTED NOT DETECTED   Proteus species NOT DETECTED NOT DETECTED   Salmonella species NOT DETECTED NOT DETECTED   Serratia marcescens NOT DETECTED NOT DETECTED   Haemophilus influenzae NOT DETECTED NOT DETECTED   Neisseria  meningitidis NOT DETECTED NOT DETECTED   Pseudomonas aeruginosa NOT DETECTED NOT DETECTED   Stenotrophomonas maltophilia NOT DETECTED NOT DETECTED   Candida albicans NOT DETECTED NOT DETECTED   Candida auris NOT DETECTED NOT DETECTED   Candida glabrata NOT DETECTED NOT DETECTED   Candida krusei NOT DETECTED NOT DETECTED   Candida parapsilosis NOT DETECTED NOT DETECTED   Candida tropicalis NOT DETECTED NOT DETECTED   Cryptococcus neoformans/gattii NOT DETECTED NOT DETECTED   Methicillin resistance mecA/C DETECTED (A) NOT DETECTED    Arley Phenix RPh 10/25/2023, 1:26 AM

## 2023-10-26 ENCOUNTER — Encounter (HOSPITAL_COMMUNITY): Payer: Self-pay | Admitting: Orthopedic Surgery

## 2023-10-26 DIAGNOSIS — G9341 Metabolic encephalopathy: Secondary | ICD-10-CM | POA: Diagnosis not present

## 2023-10-26 DIAGNOSIS — M7989 Other specified soft tissue disorders: Secondary | ICD-10-CM | POA: Diagnosis not present

## 2023-10-26 DIAGNOSIS — E876 Hypokalemia: Secondary | ICD-10-CM | POA: Diagnosis not present

## 2023-10-26 DIAGNOSIS — A499 Bacterial infection, unspecified: Secondary | ICD-10-CM | POA: Diagnosis not present

## 2023-10-26 DIAGNOSIS — A419 Sepsis, unspecified organism: Secondary | ICD-10-CM | POA: Diagnosis not present

## 2023-10-26 DIAGNOSIS — R6521 Severe sepsis with septic shock: Secondary | ICD-10-CM | POA: Diagnosis not present

## 2023-10-26 DIAGNOSIS — I739 Peripheral vascular disease, unspecified: Secondary | ICD-10-CM | POA: Diagnosis not present

## 2023-10-26 DIAGNOSIS — K651 Peritoneal abscess: Secondary | ICD-10-CM | POA: Diagnosis not present

## 2023-10-26 DIAGNOSIS — M60052 Infective myositis, left thigh: Secondary | ICD-10-CM | POA: Diagnosis not present

## 2023-10-26 DIAGNOSIS — K5720 Diverticulitis of large intestine with perforation and abscess without bleeding: Secondary | ICD-10-CM | POA: Diagnosis not present

## 2023-10-26 DIAGNOSIS — M726 Necrotizing fasciitis: Secondary | ICD-10-CM | POA: Diagnosis not present

## 2023-10-26 LAB — BASIC METABOLIC PANEL
Anion gap: 8 (ref 5–15)
BUN: 11 mg/dL (ref 8–23)
CO2: 23 mmol/L (ref 22–32)
Calcium: 7.8 mg/dL — ABNORMAL LOW (ref 8.9–10.3)
Chloride: 102 mmol/L (ref 98–111)
Creatinine, Ser: <0.3 mg/dL — ABNORMAL LOW (ref 0.44–1.00)
Glucose, Bld: 138 mg/dL — ABNORMAL HIGH (ref 70–99)
Potassium: 3.1 mmol/L — ABNORMAL LOW (ref 3.5–5.1)
Sodium: 133 mmol/L — ABNORMAL LOW (ref 135–145)

## 2023-10-26 LAB — CBC
HCT: 20.9 % — ABNORMAL LOW (ref 36.0–46.0)
Hemoglobin: 7 g/dL — ABNORMAL LOW (ref 12.0–15.0)
MCH: 28.5 pg (ref 26.0–34.0)
MCHC: 33.5 g/dL (ref 30.0–36.0)
MCV: 85 fL (ref 80.0–100.0)
Platelets: 245 10*3/uL (ref 150–400)
RBC: 2.46 MIL/uL — ABNORMAL LOW (ref 3.87–5.11)
RDW: 14.3 % (ref 11.5–15.5)
WBC: 10.1 10*3/uL (ref 4.0–10.5)
nRBC: 0.2 % (ref 0.0–0.2)

## 2023-10-26 LAB — GLUCOSE, CAPILLARY
Glucose-Capillary: 131 mg/dL — ABNORMAL HIGH (ref 70–99)
Glucose-Capillary: 99 mg/dL (ref 70–99)
Glucose-Capillary: 84 mg/dL (ref 70–99)
Glucose-Capillary: 98 mg/dL (ref 70–99)
Glucose-Capillary: 102 mg/dL — ABNORMAL HIGH (ref 70–99)
Glucose-Capillary: 217 mg/dL — ABNORMAL HIGH (ref 70–99)

## 2023-10-26 LAB — MAGNESIUM: Magnesium: 2 mg/dL (ref 1.7–2.4)

## 2023-10-26 LAB — PHOSPHORUS: Phosphorus: 2.5 mg/dL (ref 2.5–4.6)

## 2023-10-26 LAB — PREPARE RBC (CROSSMATCH)

## 2023-10-26 MED ORDER — DEXTROSE IN LACTATED RINGERS 5 % IV SOLN: INTRAVENOUS | Status: AC

## 2023-10-26 MED ORDER — POTASSIUM CHLORIDE 10 MEQ/50ML IV SOLN
10.0000 meq | INTRAVENOUS | Status: AC
  Administered 2023-10-26 (×3): 10 meq via INTRAVENOUS
  Filled 2023-10-26 (×4): qty 50

## 2023-10-26 MED ORDER — DOCUSATE SODIUM 100 MG PO CAPS
100.0000 mg | ORAL_CAPSULE | Freq: Two times a day (BID) | ORAL | Status: DC
  Administered 2023-10-26 – 2023-10-27 (×3): 100 mg via ORAL
  Filled 2023-10-26 (×5): qty 1

## 2023-10-26 MED ORDER — CEFAZOLIN SODIUM-DEXTROSE 2-4 GM/100ML-% IV SOLN
2.0000 g | Freq: Four times a day (QID) | INTRAVENOUS | Status: AC
  Administered 2023-10-26 (×2): 2 g via INTRAVENOUS
  Filled 2023-10-26 (×2): qty 100

## 2023-10-26 MED ORDER — BOOST / RESOURCE BREEZE PO LIQD CUSTOM
1.0000 | Freq: Three times a day (TID) | ORAL | Status: DC
  Administered 2023-10-26 – 2023-10-27 (×4): 1 via ORAL

## 2023-10-26 MED ORDER — NOREPINEPHRINE 4 MG/250ML-% IV SOLN
0.0000 ug/min | INTRAVENOUS | Status: DC
  Administered 2023-10-26 – 2023-10-27 (×2): 2 ug/min via INTRAVENOUS
  Filled 2023-10-26 (×2): qty 250

## 2023-10-26 MED ORDER — METOCLOPRAMIDE HCL 5 MG PO TABS
5.0000 mg | ORAL_TABLET | Freq: Three times a day (TID) | ORAL | Status: DC | PRN
Start: 2023-10-26 — End: 2023-11-05

## 2023-10-26 MED ORDER — METHOCARBAMOL 1000 MG/10ML IJ SOLN
500.0000 mg | Freq: Four times a day (QID) | INTRAMUSCULAR | Status: AC | PRN
Start: 2023-10-26 — End: ?
  Administered 2023-11-02 – 2023-11-03 (×2): 500 mg via INTRAVENOUS
  Filled 2023-10-26 (×3): qty 5

## 2023-10-26 MED ORDER — CARMEX CLASSIC LIP BALM EX OINT
TOPICAL_OINTMENT | CUTANEOUS | Status: DC | PRN
  Filled 2023-10-26 (×2): qty 10

## 2023-10-26 MED ORDER — METHOCARBAMOL 500 MG PO TABS
500.0000 mg | ORAL_TABLET | Freq: Four times a day (QID) | ORAL | Status: AC | PRN
Start: 2023-10-26 — End: ?
  Administered 2023-10-31 – 2023-11-01 (×2): 500 mg via ORAL
  Filled 2023-10-26 (×2): qty 1

## 2023-10-26 MED ORDER — METOCLOPRAMIDE HCL 5 MG/ML IJ SOLN: 5.0000 mg | Freq: Three times a day (TID) | INTRAMUSCULAR | Status: DC | PRN

## 2023-10-26 MED ORDER — SODIUM CHLORIDE 0.9% IV SOLUTION: Freq: Once | INTRAVENOUS | Status: AC

## 2023-10-26 MED ORDER — POTASSIUM CHLORIDE 10 MEQ/50ML IV SOLN
INTRAVENOUS | Status: AC
  Administered 2023-10-26: 10 meq via INTRAVENOUS
  Filled 2023-10-26: qty 50

## 2023-10-26 NOTE — Progress Notes (Signed)
NAME:  Ashley Galloway, MRN:  [id number], DOB:  1951/06/27, LOS: 2 ADMISSION DATE:  10/23/2023, CONSULTATION DATE:  10/24/2023 REFERRING MD: Azucena Fallen, MD, CHIEF COMPLAINT: Septic shock since 4 pm   History of Present Illness:  A 73 y.o. female with HTN, dyslipidemia, PVD, COPD, tobacco smoking (quit few months ago), recent Streptococcus intermedius bacteremia, hepatic abscess (percutaneous drain d/c on 10/20/2022), and diverticulitis in Dec 2024 who completed antibiotics few days ago. She presented to ER 10/24/2023 in am with c/o LLQ swelling and erythema, and left thigh pain for few days. She has fever, chills, general malaise and loss of appetite, but denies chest pain, cough, SOB, wheezing, or N/V/D. She reports constipation.  New CT imaging finds new large LLQ abscess with associated diverticulitis as well as fluid/air into abductor muscle concerning for myositis.  Received 4.5 L LR and on her 5th L of LR boluses Started on IV abx: Vancomycin, Cefepime, and Flagyl--->Linezolid, Zosyn and Clindamycin. General surgery consulted and request IR, s/p perc drain to abdominal abscess. Developed Afib in ICU, new    Pertinent  Medical History  HTN, dyslipidemia, PVD, COPD, tobacco smoking (quit few months ago), recent Streptococcus intermedius bacteremia, hepatic abscess (percutaneous drain d/c on 10/20/2022), diverticulitis in Dec 2024  Significant Hospital Events: Including procedures, antibiotic start and stop dates in addition to other pertinent events   1/25 to OR for debridement of necrotizing fasciitis of LLE, IR placed LLQ drain for abscess  Interim History / Subjective:   No acute events overnight Weaned off levophed [place] better this morning Brother at bedside  Objective   Blood pressure (!) 96/43, pulse 77, temperature 98.8 F (37.1 C), resp. rate 15, height 5\' 5"  (1.651 m), weight 75.5 kg, SpO2 99%.        Intake/Output Summary (Last 24 hours) at  10/26/2023 0815 Last data filed at 10/26/2023 2130 Gross per 24 hour  Intake 4038.57 ml  Output 2625 ml  Net 1413.57 ml   Filed Weights   10/23/23 2328 10/24/23 1415  Weight: 71.3 kg 75.5 kg    Examination: Gen: no acute distress, lay ing bed HEENT: [place]/AT, moist mucous membranes, sclera anicteric Resp: clear to auscultation, no wheezing Abdomen: soft, JP drain with feculent drainage Extremities: Left leg is wrapped in dressing, distal extremities warm and well perfused  Leg wound cultures from OR - GPC, GNR BCID - positive for staph and strep  Resolved Hospital Problem list   AKI  Assessment & Plan:  Septic shock due to intra-abdominal abscess/diverticulitis and left thigh necrotizing fascitis.  S/p CT guided drain placement 1/25 for abdominal abscess S/p excision and debridement of LLE with wound vac placement 1/25 Staph/strep Bacteremia Complicated by recent strep intermedius bacteremia, hepatic abscess (drain was d/c 10/21/2023), and chronic diverticulosis. Finished PO Abx few days ago -General Surgery, ortho, IR following -Levophed for MAP>65 mmHg - continue linzeolid, zosyn and micafungin per ID  New Afib due to sepsis 10/05/2023: Echo EF 60%, G1DD Optimize electroltes K>4, Mg>2  Acute metabolic encephalopathy  -Secondary to above - Appears to be improving  COPD -Duoneb PRN   HTN and dyslipidemia -Hold home meds  Moderate PCM -Dietitian consult  Hypokalemia Mild hypoNa -Monitor Na - replete potassium as needed  Hyperglycemia due to sepsis Pre-DM (HbA1c 6.3%) - ISS - Glycemic control   Elevated INR: recent IV and PO Abx and sepsis now -trend   Anxiety: well-controlled  Lumbar stenosis, no current complaints  Peripheral arterial disease, chronic  Best Practice (  right click and "Reselect all SmartList Selections" daily)   Diet/type: clear liquids DVT prophylaxis prophylactic heparin  Pressure ulcer(s): N/A GI prophylaxis: H2B Lines: Central  line Foley:  Yes, and it is still needed Code Status:  full code Last date of multidisciplinary goals of care discussion [husband updated at bedside 1/26 am]   Critical care time:     The patient is critically ill due to septic shock.  Critical care was necessary to treat or prevent imminent or life-threatening deterioration.  Critical care was time spent personally by me on the following activities: development of treatment plan with patient and/or surrogate as well as nursing, discussions with consultants, evaluation of patient's response to treatment, examination of patient, obtaining history from patient or surrogate, ordering and performing treatments and interventions, ordering and review of laboratory studies, ordering and review of radiographic studies, pulse oximetry, re-evaluation of patient's condition and participation in multidisciplinary rounds.   Critical Care Time devoted to patient care services described in this note is 40 minutes. This time reflects time of care of this signee Martina Sinner . This critical care time does not reflect separately billable procedures or procedure time, teaching time or supervisory time of PA/NP/Med student/Med Resident etc but could involve care discussion time.       Melody Comas, MD [facility] Office: [phone]   See Amion for personal pager PCCM on call pager [phone] until 7pm. Please call Elink 7p-7a. [phone]

## 2023-10-26 NOTE — Progress Notes (Addendum)
   10/26/23 1408  TOC Brief Assessment  Insurance and Status Reviewed  Patient has primary care physician Yes  Home environment has been reviewed home with spouse  Prior level of function: unknown  Prior/Current Home Services No current home services  Social Drivers of Health Review SDOH reviewed no interventions necessary  Readmission risk has been reviewed Yes  Transition of care needs no transition of care needs at this time   Brief note completed.  Per chart review, pt has had IV abx in the home as recently as Dec 2024 via [employer].  May need this again.  TOC will monitor.

## 2023-10-26 NOTE — Progress Notes (Signed)
eLink Physician-Brief Progress Note Patient Name: Seriah Brotzman DOB: Oct 21, 1950 MRN: [id number]   Date of Service  10/26/2023  HPI/Events of Note  Patient in ICU with severe sepsis from necrotizing skin and soft tissue infection and intra-abdominal infection.  Has been off pressors.  Current blood pressure is 101/31.   RN inquiring whether or not norepinephrine should be restarted.  eICU Interventions  Chart reviewed.  MAP goal is still 65.  Restart norepinephrine to achieve map goal.  Discussed with bedside RN.     Intervention Category Intermediate Interventions: Hypotension - evaluation and management  [name] 10/26/2023, 10:05 PM

## 2023-10-26 NOTE — Progress Notes (Signed)
2 Days Post-Op   Subjective/Chief Complaint: Alert and oriented this am and in good spirits. Off pressors. Complains of leg pain. Denies abdominal pain, n/v. Denies flatus or BM.   Febrile last 24h. Intermittent low BPs  Brother at bedside  Objective: Vital signs in last 24 hours: Temp:  [98.6 F (37 C)-101.1 F (38.4 C)] 98.8 F (37.1 C) (01/27 0600) Resp:  [11-31] 15 (01/27 0600) BP: (94-118)/(39-43) 96/43 (01/27 0400) SpO2:  [99 %-100 %] 99 % (01/27 0400) Last BM Date :  (PTA)  Intake/Output from previous day: 01/26 0701 - 01/27 0700 In: 4216.6 [I.V.:1398.9; IV Piggyback:2812.7] Out: 2625 [Urine:2000; Drains:625] Intake/Output this shift: No intake/output data recorded.  PE: General: pleasant, WD, female who is laying in bed in NAD Lungs: Respiratory effort nonlabored Abd: soft, NT, ND, drain LLQ with brown output - 25 ml/24h MSK: LLE with upper thigh wrapped in ace  Skin: warm and dry Psych: A&Ox3 with an appropriate affect.   Lab Results:  Recent Labs    10/25/23 0346 10/26/23 0311  WBC 6.6 10.1  HGB 7.8* 7.0*  HCT 23.7* 20.9*  PLT 279 245   BMET Recent Labs    10/25/23 2102 10/26/23 0311  NA 136 133*  K 3.7 3.1*  CL 104 102  CO2 22 23  GLUCOSE 116* 138*  BUN 12 11  CREATININE 0.52 <0.30*  CALCIUM 7.9* 7.8*   PT/INR Recent Labs    10/23/23 2337  LABPROT 18.9*  INR 1.6*   ABG Recent Labs    10/24/23 2133 10/24/23 2225  PHART 7.435 7.422  HCO3 21.6 21.5    Studies/Results: DG Chest 1 View Result Date: 10/24/2023 CLINICAL DATA:  Dyspnea.  Central line placement. EXAM: CHEST  1 VIEW COMPARISON:  Radiographs yesterday, chest CT earlier today FINDINGS: Left internal jugular central venous catheter tip overlies the right atrium, partially obscured by overlying monitoring devices. No pneumothorax. Overall low lung volumes. No focal airspace disease, large pleural effusion or pulmonary edema. The heart is normal in size. IMPRESSION: Left  internal jugular central venous catheter tip overlies the right atrium. No pneumothorax. Electronically Signed   By: Narda Rutherford M.D.   On: 10/24/2023 20:47   CT GUIDED PERITONEAL/RETROPERITONEAL FLUID DRAIN BY PERC CATH Result Date: 10/24/2023 CLINICAL DATA:  Diverticular abscess EXAM: CT GUIDED DRAINAGE OF PELVIC ABSCESS ANESTHESIA/SEDATION: Intravenous Fentanyl and Versed 1mg  were administered by RN during a total moderate (conscious) sedation time of 20 minutes; the patient's level of consciousness and physiological / cardiorespiratory status were monitored continuously by radiology RN under my direct supervision. PROCEDURE: The procedure, risks, benefits, and alternatives were explained to the patient. Questions regarding the procedure were encouraged and answered. The patient understands and consents to the procedure. select axial scans through the pelvis were obtained. the complex left pelvic fluid collection abutting the urinary bladder was localized and an appropriate skin entry site was determined and marked. The operative field was prepped with chlorhexidinein a sterile fashion, and a sterile drape was applied covering the operative field. A sterile gown and sterile gloves were used for the procedure. Local anesthesia was provided with 1% Lidocaine. Under CT fluoroscopic guidance, 18 gauge trocar needle advanced to the collection. Amplatz guidewire advanced easily within the collection, confirmed on CT fluoroscopy. Tract dilated to facilitate placement of a 12 French pigtail drain catheter, formed centrally within the collection. A 10 mL of feculent material were aspirated, sent for Gram stain and culture. Catheter secured externally 0 Prolene sutures and StatLock  and placed to gravity drain bag. The patient tolerated the procedure well. RADIATION DOSE REDUCTION: This exam was performed according to the departmental dose-optimization program which includes automated exposure control,  adjustment of the mA and/or kV according to patient size and/or use of iterative reconstruction technique. COMPLICATIONS: None immediate FINDINGS: Complex gas and debris filled collection in the low left pelvis abutting the urinary bladder is again localized. Deep gas in the fascial planes of the proximal left thigh also noted, distal extent not visualized. 12 French pigtail drain catheter placed as above. Feculent aspirate sent for Gram stain and culture. IMPRESSION: 1. Technically successful CT-guided pelvic abscess drain catheter placement. Feculent aspirate sent for Gram stain and culture. 2. Gas bubbles dissecting through deep muscular and fascial planes of the proximal left thigh. This finding was telephoned to Dr. Natale Milch at the beginning of the procedure. Electronically Signed   By: Corlis Leak M.D.   On: 10/24/2023 19:22   CT EXTREMITY LOWER LEFT WO CONTRAST Result Date: 10/24/2023 CLINICAL DATA:  Recent drain placement and abdomen. Scan due to pain, swelling, and subcutaneous free air. Evaluate for necrotizing fasciitis. EXAM: CT OF THE LOWER LEFT EXTREMITY WITHOUT CONTRAST TECHNIQUE: Multidetector CT imaging of the lower left extremity was performed according to the standard protocol. RADIATION DOSE REDUCTION: This exam was performed according to the departmental dose-optimization program which includes automated exposure control, adjustment of the mA and/or kV according to patient size and/or use of iterative reconstruction technique. COMPARISON:  CT chest, abdomen, and pelvis 10/24/2023 FINDINGS: Bones/Joint/Cartilage No cortical erosion is seen.  No acute fracture. There are individual screws within the distal aspect of the first and fifth metatarsals. Ligaments Suboptimally assessed by CT. Muscles and Tendons There is again air seen tracking along the sartorius and the left groin musculature as on CT of the chest, abdomen, and pelvis earlier today. There is extension of this air within the fascial  planes surrounding these muscles throughout the more distal thigh, and air is seen around fascial planes within the sartorius, anterior left quadriceps, obturator externus, adductor longus, brevis, and magnus. Within the mid to distal thigh air surrounds the hamstring fascial planes diffusely. Within the calf, air surrounds the fascial planes of the medial greater than lateral heads of the gastrocnemius muscles to the level of the ankle. Findings are concerning for necrotizing fasciitis diffusely throughout the left lower leg. Within the thigh, although the anterior superficial portion of the quadriceps musculature appears affected, and the deep fascial planes of the quadriceps musculature do not demonstrate air. Within the calf, the anterior compartment and deep posterior compartment appear less affected. Soft tissues There is moderate diffuse subcutaneous fat edema and swelling throughout the left thigh and calf. There is density likely representing confluent edema lateral to the knee (axial series 6, image 319 and coronal series 9, image 118). No walled-off abscess is seen. There is a new anterior left pelvic approach drainage catheter terminating within the region of the complex collection containing air and fluid within left hemipelvis on CT chest, abdomen, pelvis earlier today. That suspected abscess adjacent to the wall thickening within the sigmoid colon is markedly decreased from prior. A Foley catheter is seen within the urinary bladder which contains contrast. IMPRESSION: 1. There is extensive air tracking along the fascial planes of the left hip, groin, thigh, and posterior calf musculature concerning for diffuse necrotizing fasciitis extending down to the level of the ankle. 2. There is moderate diffuse subcutaneous fat edema and swelling throughout the left  thigh and calf. There is density likely representing confluent edema lateral to the knee. No walled-off abscess is seen. 3. There is a new anterior  left pelvic approach drainage catheter terminating within the region of the complex collection containing air and fluid within left hemipelvis on CT chest, abdomen, pelvis earlier today. That suspected abscess adjacent to the wall thickening within the sigmoid colon is markedly decreased from prior. Critical Value/emergent results were called by telephone at the time of interpretation on 10/24/2023 at 6:12 pm to provider Carma Leaven , who verbally acknowledged these results. Electronically Signed   By: Neita Garnet M.D.   On: 10/24/2023 18:15    Anti-infectives: Anti-infectives (From admission, onward)    Start     Dose/Rate Route Frequency Ordered Stop   10/26/23 0400  vancomycin (VANCOREADY) IVPB 1500 mg/300 mL  Status:  Discontinued        1,500 mg 150 mL/hr over 120 Minutes Intravenous Every 24 hours 10/25/23 0725 10/26/23 0754   10/26/23 0002  ceFAZolin (ANCEF) IVPB 2g/100 mL premix        2 g 200 mL/hr over 30 Minutes Intravenous Every 6 hours 10/26/23 0002 10/26/23 0650   10/25/23 2200  linezolid (ZYVOX) IVPB 600 mg        600 mg 300 mL/hr over 60 Minutes Intravenous Every 12 hours 10/25/23 1634     10/25/23 1800  micafungin (MYCAMINE) 100 mg in sodium chloride 0.9 % 100 mL IVPB        100 mg 105 mL/hr over 1 Hours Intravenous Every 24 hours 10/25/23 1645     10/25/23 0215  vancomycin (VANCOREADY) IVPB 1500 mg/300 mL        1,500 mg 150 mL/hr over 120 Minutes Intravenous  Once 10/25/23 0121 10/25/23 0358   10/24/23 1000  linezolid (ZYVOX) IVPB 600 mg  Status:  Discontinued        600 mg 300 mL/hr over 60 Minutes Intravenous Every 12 hours 10/24/23 0224 10/24/23 2026   10/24/23 0800  piperacillin-tazobactam (ZOSYN) IVPB 3.375 g        3.375 g 12.5 mL/hr over 240 Minutes Intravenous Every 8 hours 10/24/23 0231     10/24/23 0600  clindamycin (CLEOCIN) IVPB 600 mg  Status:  Discontinued        600 mg 100 mL/hr over 30 Minutes Intravenous Every 8 hours 10/24/23 0224 10/25/23  1634   10/24/23 0230  piperacillin-tazobactam (ZOSYN) IVPB 3.375 g  Status:  Discontinued       Placed in "And" Linked Group   3.375 g 100 mL/hr over 30 Minutes Intravenous  Once 10/24/23 0224 10/24/23 0229   10/23/23 2345  ceFEPIme (MAXIPIME) 2 g in sodium chloride 0.9 % 100 mL IVPB        2 g 200 mL/hr over 30 Minutes Intravenous  Once 10/23/23 2337 10/24/23 0035   10/23/23 2345  metroNIDAZOLE (FLAGYL) IVPB 500 mg        500 mg 100 mL/hr over 60 Minutes Intravenous  Once 10/23/23 2337 10/24/23 0127   10/23/23 2345  vancomycin (VANCOCIN) IVPB 1000 mg/200 mL premix        1,000 mg 200 mL/hr over 60 Minutes Intravenous  Once 10/23/23 2337 10/24/23 0313       Assessment/Plan: 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 Linna Caprice 1/25  S/p IR drain of LLQ abd abscess 1/25  - febrile to 101.61F yesterday 1600. WBC normal. Lactate trending down. Off  pressors. Consider repeat CT in next few days - hgb drifting and 7.0 today. Do not think this is from an intraabdominal source at this time - Drain functioning - abdominal exam stable, soft with no evidence of peritonitis. Denies flatus - cultures from debridement and drain pending - do not recommend further surgical intervention at this time. Monitor drain and continue IV abx per ID (changed to piptzo/linezolid/micafungin 1/26)  - from diverticulitis standpoint can advance to clears  FEN: clears ID: zosyn, micafungan, zyvox VTE: hep subq   LOS: 2 days   I reviewed Consultant IR, ortho, ID notes, hospitalist notes, last 24 h vitals and pain scores, last 48 h intake and output, last 24 h labs and trends, and last 24 h imaging results.   Eric Form, [facility] 10/26/2023, 9:20 AM Please see Amion for pager number during day hours 7:00am-4:30pm

## 2023-10-26 NOTE — Progress Notes (Signed)
Arterial line (labeled) has good waveform, draws and flushes blood, insertion sire WNL at this time.

## 2023-10-26 NOTE — Progress Notes (Signed)
eLink Physician-Brief Progress Note Patient Name: Nykiah Ma DOB: 1951/04/04 MRN: [id number]   Date of Service  10/26/2023  HPI/Events of Note  Hypokalemia noted on a.m. labs.  eICU Interventions  Replacement ordered.     Intervention Category Minor Interventions: Electrolytes abnormality - evaluation and management  [name] 10/26/2023, 6:00 AM

## 2023-10-26 NOTE — Plan of Care (Signed)
  Problem: Education: Goal: Ability to describe self-care measures that may prevent or decrease complications (Diabetes Survival Skills Education) will improve Outcome: Progressing Goal: Individualized Educational Video(s) Outcome: Progressing   Problem: Fluid Volume: Goal: Ability to maintain a balanced intake and output will improve Outcome: Progressing   Problem: Nutritional: Goal: Maintenance of adequate nutrition will improve Outcome: Progressing Goal: Progress toward achieving an optimal weight will improve Outcome: Progressing   Problem: Clinical Measurements: Goal: Signs and symptoms of infection will decrease Outcome: Progressing   Problem: Clinical Measurements: Goal: Ability to maintain clinical measurements within normal limits will improve Outcome: Progressing Goal: Diagnostic test results will improve Outcome: Progressing   Problem: Elimination: Goal: Will not experience complications related to bowel motility Outcome: Progressing   Problem: Skin Integrity: Goal: Risk for impaired skin integrity will decrease Outcome: Progressing

## 2023-10-26 NOTE — Plan of Care (Signed)

## 2023-10-26 NOTE — Progress Notes (Signed)
Subjective:  Patient reports pain as mild to moderate.  Denies N/V/CP/SOB. She denies numbness. She reports she is still having some pain but improved.  Family at bedside. Discussed plan for transfer to [facility] where Dr. Lajoyce Corners will take over care.   Objective:   VITALS:   Vitals:   10/26/23 1151 10/26/23 1209 10/26/23 1300 10/26/23 1404  BP:      Pulse: 98 91  89  Resp: (!) 23 20 (!) 24 20  Temp: 99.5 F (37.5 C) 98.3 F (36.8 C) 99.1 F (37.3 C) 99.1 F (37.3 C)  TempSrc: Bladder Oral  Bladder  SpO2: 100% 99%  100%  Weight:      Height:        Patient lying in bed.  Neurologically intact Neurovascular intact Sensation intact distally Intact pulses distally Dorsiflexion/Plantar flexion intact Wound vac 125 mmHg no leaks detected. 175 cc SS output.  Ace bandage from foot to thigh.   Lab Results  Component Value Date   WBC 10.1 10/26/2023   HGB 7.0 (L) 10/26/2023   HCT 20.9 (L) 10/26/2023   MCV 85.0 10/26/2023   PLT 245 10/26/2023   BMET    Component Value Date/Time   NA 133 (L) 10/26/2023 0311   K 3.1 (L) 10/26/2023 0311   CL 102 10/26/2023 0311   CO2 23 10/26/2023 0311   GLUCOSE 138 (H) 10/26/2023 0311   BUN 11 10/26/2023 0311   CREATININE <0.30 (L) 10/26/2023 0311   CALCIUM 7.8 (L) 10/26/2023 0311   GFRNONAA NOT CALCULATED 10/26/2023 0311   Results for orders placed or performed during the hospital encounter of 10/23/23  Resp panel by RT-PCR (RSV, Flu A&B, Covid) Anterior Nasal Swab     Status: None   Collection Time: 10/23/23 11:37 PM   Specimen: Anterior Nasal Swab  Result Value Ref Range Status   SARS Coronavirus 2 by RT PCR NEGATIVE NEGATIVE Final    Comment: (NOTE) SARS-CoV-2 target nucleic acids are NOT DETECTED.  The SARS-CoV-2 RNA is generally detectable in upper respiratory specimens during the acute phase of infection. The lowest concentration of SARS-CoV-2 viral copies this assay can detect is 138 copies/mL. A negative result does  not preclude SARS-Cov-2 infection and should not be used as the sole basis for treatment or other patient management decisions. A negative result may occur with  improper specimen collection/handling, submission of specimen other than nasopharyngeal swab, presence of viral mutation(s) within the areas targeted by this assay, and inadequate number of viral copies(<138 copies/mL). A negative result must be combined with clinical observations, patient history, and epidemiological information. The expected result is Negative.  Fact Sheet for Patients:  BloggerCourse.com  Fact Sheet for Healthcare Providers:  SeriousBroker.it  This test is no t yet approved or cleared by the [place] FDA and  has been authorized for detection and/or diagnosis of SARS-CoV-2 by FDA under an Emergency Use Authorization (EUA). This EUA will remain  in effect (meaning this test can be used) for the duration of the COVID-19 declaration under Section 564(b)(1) of the Act, 21 U.S.C.section 360bbb-3(b)(1), unless the authorization is terminated  or revoked sooner.       Influenza A by PCR NEGATIVE NEGATIVE Final   Influenza B by PCR NEGATIVE NEGATIVE Final    Comment: (NOTE) The Xpert Xpress SARS-CoV-2/FLU/RSV plus assay is intended as an aid in the diagnosis of influenza from Nasopharyngeal swab specimens and should not be used as a sole basis for treatment. Nasal washings  and aspirates are unacceptable for Xpert Xpress SARS-CoV-2/FLU/RSV testing.  Fact Sheet for Patients: BloggerCourse.com  Fact Sheet for Healthcare Providers: SeriousBroker.it  This test is not yet approved or cleared by the [place] FDA and has been authorized for detection and/or diagnosis of SARS-CoV-2 by FDA under an Emergency Use Authorization (EUA). This EUA will remain in effect (meaning this test can be used) for the  duration of the COVID-19 declaration under Section 564(b)(1) of the Act, 21 U.S.C. section 360bbb-3(b)(1), unless the authorization is terminated or revoked.     Resp Syncytial Virus by PCR NEGATIVE NEGATIVE Final    Comment: (NOTE) Fact Sheet for Patients: BloggerCourse.com  Fact Sheet for Healthcare Providers: SeriousBroker.it  This test is not yet approved or cleared by the [place] FDA and has been authorized for detection and/or diagnosis of SARS-CoV-2 by FDA under an Emergency Use Authorization (EUA). This EUA will remain in effect (meaning this test can be used) for the duration of the COVID-19 declaration under Section 564(b)(1) of the Act, 21 U.S.C. section 360bbb-3(b)(1), unless the authorization is terminated or revoked.  Performed at [facility], [address], [place], [place] [address]   Blood Culture (routine x 2)     Status: Abnormal   Collection Time: 10/23/23 11:37 PM   Specimen: BLOOD  Result Value Ref Range Status   Specimen Description   Final    BLOOD LEFT ANTECUBITAL Performed at [facility], [address], [place], [place] [address]    Special Requests   Final    BOTTLES DRAWN AEROBIC AND ANAEROBIC Blood Culture results may not be optimal due to an inadequate volume of blood received in culture bottles Performed at [facility], [address] [place], [place] [address]    Culture  Setup Time   Final    GRAM POSITIVE COCCI IN CLUSTERS AEROBIC BOTTLE ONLY CRITICAL RESULT CALLED TO, READ BACK BY AND VERIFIED WITH: PHARMD E JACKSON 10/24/2023 @ 2345 BY AB    Culture (A)  Final    STAPHYLOCOCCUS EPIDERMIDIS THE SIGNIFICANCE OF ISOLATING THIS ORGANISM FROM A SINGLE SET OF BLOOD CULTURES WHEN MULTIPLE SETS ARE DRAWN IS UNCERTAIN. PLEASE NOTIFY THE MICROBIOLOGY DEPARTMENT WITHIN ONE WEEK IF SPECIATION AND SENSITIVITIES ARE  REQUIRED. Performed at [facility], [address] [place], [place] [address]    Report Status 10/25/2023 FINAL  Final  Blood Culture ID Panel (Reflexed)     Status: Abnormal   Collection Time: 10/23/23 11:37 PM  Result Value Ref Range Status   Enterococcus faecalis NOT DETECTED NOT DETECTED Final   Enterococcus Faecium NOT DETECTED NOT DETECTED Final   Listeria monocytogenes NOT DETECTED NOT DETECTED Final   Staphylococcus species DETECTED (A) NOT DETECTED Final    Comment: CRITICAL RESULT CALLED TO, READ BACK BY AND VERIFIED WITH: PHARMD E JACKSON 10/24/2023 @ 2345 BY AB    Staphylococcus aureus (BCID) NOT DETECTED NOT DETECTED Final   Staphylococcus epidermidis DETECTED (A) NOT DETECTED Final    Comment: Methicillin (oxacillin) resistant coagulase negative staphylococcus. Possible blood culture contaminant (unless isolated from more than one blood culture draw or clinical case suggests pathogenicity). No antibiotic treatment is indicated for blood  culture contaminants. CRITICAL RESULT CALLED TO, READ BACK BY AND VERIFIED WITH: PHARMD E JACKSON 10/24/2023 @ 2345 BY AB    Staphylococcus lugdunensis NOT DETECTED NOT DETECTED Final   Streptococcus species NOT DETECTED NOT DETECTED Final   Streptococcus agalactiae NOT DETECTED NOT DETECTED Final  Streptococcus pneumoniae NOT DETECTED NOT DETECTED Final   Streptococcus pyogenes NOT DETECTED NOT DETECTED Final   A.calcoaceticus-baumannii NOT DETECTED NOT DETECTED Final   Bacteroides fragilis NOT DETECTED NOT DETECTED Final   Enterobacterales NOT DETECTED NOT DETECTED Final   Enterobacter cloacae complex NOT DETECTED NOT DETECTED Final   Escherichia coli NOT DETECTED NOT DETECTED Final   Klebsiella aerogenes NOT DETECTED NOT DETECTED Final   Klebsiella oxytoca NOT DETECTED NOT DETECTED Final   Klebsiella pneumoniae NOT DETECTED NOT DETECTED Final   Proteus species NOT DETECTED NOT DETECTED Final   Salmonella species NOT DETECTED  NOT DETECTED Final   Serratia marcescens NOT DETECTED NOT DETECTED Final   Haemophilus influenzae NOT DETECTED NOT DETECTED Final   Neisseria meningitidis NOT DETECTED NOT DETECTED Final   Pseudomonas aeruginosa NOT DETECTED NOT DETECTED Final   Stenotrophomonas maltophilia NOT DETECTED NOT DETECTED Final   Candida albicans NOT DETECTED NOT DETECTED Final   Candida auris NOT DETECTED NOT DETECTED Final   Candida glabrata NOT DETECTED NOT DETECTED Final   Candida krusei NOT DETECTED NOT DETECTED Final   Candida parapsilosis NOT DETECTED NOT DETECTED Final   Candida tropicalis NOT DETECTED NOT DETECTED Final   Cryptococcus neoformans/gattii NOT DETECTED NOT DETECTED Final   Methicillin resistance mecA/C DETECTED (A) NOT DETECTED Final    Comment: CRITICAL RESULT CALLED TO, READ BACK BY AND VERIFIED WITH: PHARMD E JACKSON 10/24/2023 @ 2345 BY AB Performed at [facility], [address], [place], [place] [address]   Blood Culture (routine x 2)     Status: None (Preliminary result)   Collection Time: 10/24/23 12:41 AM   Specimen: BLOOD  Result Value Ref Range Status   Specimen Description   Final    BLOOD BLOOD RIGHT ARM Performed at [facility], [address] [place], [place] [address]    Special Requests   Final    BOTTLES DRAWN AEROBIC AND ANAEROBIC Blood Culture results may not be optimal due to an inadequate volume of blood received in culture bottles Performed at [facility], [address], [place], [place] [address]    Culture   Final    NO GROWTH 2 DAYS Performed at [facility], [address] [place], [place] [address]    Report Status PENDING  Incomplete  MRSA Next Gen by PCR, Nasal     Status: None   Collection Time: 10/24/23  1:02 PM   Specimen: Nasal Mucosa; Nasal Swab  Result Value Ref Range Status   MRSA by PCR Next Gen NOT DETECTED NOT DETECTED Final    Comment: (NOTE) The GeneXpert MRSA Assay (FDA approved  for NASAL specimens only), is one component of a comprehensive MRSA colonization surveillance program. It is not intended to diagnose MRSA infection nor to guide or monitor treatment for MRSA infections. Test performance is not FDA approved in patients less than 72 years old. Performed at [facility], [address] [place], [place] [address]   Aerobic/Anaerobic Culture w Gram Stain (surgical/deep wound)     Status: None (Preliminary result)   Collection Time: 10/24/23  4:22 PM   Specimen: Abscess  Result Value Ref Range Status   Specimen Description   Final    ABSCESS DRAIN Performed at [facility], [address] [place], [place] [address]    Special Requests   Final    NONE Performed at [facility], [address], [place], [place] [address]  Gram Stain   Final    FEW WBC PRESENT, PREDOMINANTLY PMN ABUNDANT GRAM POSITIVE COCCI MODERATE GRAM NEGATIVE RODS MODERATE GRAM POSITIVE RODS    Culture   Final    ABUNDANT GRAM NEGATIVE RODS ABUNDANT KLEBSIELLA OXYTOCA ABUNDANT ENTEROCOCCUS FAECIUM MODERATE PSEUDOMONAS AERUGINOSA SUSCEPTIBILITIES TO FOLLOW HOLDING FOR POSSIBLE ANAEROBE Performed at [facility], [address], [place], [place] [address]    Report Status PENDING  Incomplete  Aerobic/Anaerobic Culture w Gram Stain (surgical/deep wound)     Status: None (Preliminary result)   Collection Time: 10/24/23  9:44 PM   Specimen: Abscess; Wound  Result Value Ref Range Status   Specimen Description   Final    ABSCESS Performed at [facility], [address] [place], [place] [address]    Special Requests   Final    ABSCESS Performed at [facility], [address] [place], [place] [address]    Gram Stain   Final    FEW WBC PRESENT, PREDOMINANTLY PMN MODERATE GRAM NEGATIVE RODS MODERATE GRAM POSITIVE COCCI    Culture   Final    MODERATE KLEBSIELLA  OXYTOCA CULTURE REINCUBATED FOR BETTER GROWTH SUSCEPTIBILITIES TO FOLLOW Performed at [facility], [address] [place], [place] [address]    Report Status PENDING  Incomplete  Aerobic/Anaerobic Culture w Gram Stain (surgical/deep wound)     Status: None (Preliminary result)   Collection Time: 10/24/23  9:47 PM   Specimen: Path Tissue  Result Value Ref Range Status   Specimen Description   Final    TISSUE Performed at [facility], [address] [place], [place] [address]    Special Requests   Final    TISSUE Performed at [facility], [address] [place], [place] [address]    Gram Stain   Final    RARE WBC PRESENT, PREDOMINANTLY PMN FEW GRAM POSITIVE COCCI RARE GRAM NEGATIVE RODS    Culture   Final    MODERATE GRAM NEGATIVE RODS MODERATE KLEBSIELLA OXYTOCA SUSCEPTIBILITIES TO FOLLOW Performed at [facility], [address] [place], [place] [address]    Report Status PENDING  Incomplete     Assessment/Plan: 2 Days Post-Op   Principal Problem:   Diverticulitis of large intestine with abscess Active Problems:   Infective myositis of left thigh   AKI (acute kidney injury) (HCC)   PAD (peripheral artery disease) (HCC)   Hyperglycemia   Protein-calorie malnutrition, severe   Continue IV antibiotics and wound VAC No current need for further surgical intervention today. Continue wound vac. Plan to transfer to [facility] for Dr. Lajoyce Corners to resume care of LLE.    Ashley Galloway 10/26/2023, 3:53 PM   [facility]  [place] [address], [place], [place] [address] Phone: [phone] [email or web address] Facebook  [employer]

## 2023-10-26 NOTE — Progress Notes (Signed)
Subjective: No new complaints   Antibiotics:  Anti-infectives (From admission, onward)    Start     Dose/Rate Route Frequency Ordered Stop   10/26/23 0400  vancomycin (VANCOREADY) IVPB 1500 mg/300 mL  Status:  Discontinued        1,500 mg 150 mL/hr over 120 Minutes Intravenous Every 24 hours 10/25/23 0725 10/26/23 0754   10/26/23 0002  ceFAZolin (ANCEF) IVPB 2g/100 mL premix        2 g 200 mL/hr over 30 Minutes Intravenous Every 6 hours 10/26/23 0002 10/26/23 0650   10/25/23 2200  linezolid (ZYVOX) IVPB 600 mg        600 mg 300 mL/hr over 60 Minutes Intravenous Every 12 hours 10/25/23 1634     10/25/23 1800  micafungin (MYCAMINE) 100 mg in sodium chloride 0.9 % 100 mL IVPB        100 mg 105 mL/hr over 1 Hours Intravenous Every 24 hours 10/25/23 1645     10/25/23 0215  vancomycin (VANCOREADY) IVPB 1500 mg/300 mL        1,500 mg 150 mL/hr over 120 Minutes Intravenous  Once 10/25/23 0121 10/25/23 0358   10/24/23 1000  linezolid (ZYVOX) IVPB 600 mg  Status:  Discontinued        600 mg 300 mL/hr over 60 Minutes Intravenous Every 12 hours 10/24/23 0224 10/24/23 2026   10/24/23 0800  piperacillin-tazobactam (ZOSYN) IVPB 3.375 g        3.375 g 12.5 mL/hr over 240 Minutes Intravenous Every 8 hours 10/24/23 0231     10/24/23 0600  clindamycin (CLEOCIN) IVPB 600 mg  Status:  Discontinued        600 mg 100 mL/hr over 30 Minutes Intravenous Every 8 hours 10/24/23 0224 10/25/23 1634   10/24/23 0230  piperacillin-tazobactam (ZOSYN) IVPB 3.375 g  Status:  Discontinued       Placed in "And" Linked Group   3.375 g 100 mL/hr over 30 Minutes Intravenous  Once 10/24/23 0224 10/24/23 0229   10/23/23 2345  ceFEPIme (MAXIPIME) 2 g in sodium chloride 0.9 % 100 mL IVPB        2 g 200 mL/hr over 30 Minutes Intravenous  Once 10/23/23 2337 10/24/23 0035   10/23/23 2345  metroNIDAZOLE (FLAGYL) IVPB 500 mg        500 mg 100 mL/hr over 60 Minutes Intravenous  Once 10/23/23 2337 10/24/23 0127    10/23/23 2345  vancomycin (VANCOCIN) IVPB 1000 mg/200 mL premix        1,000 mg 200 mL/hr over 60 Minutes Intravenous  Once 10/23/23 2337 10/24/23 0313       Medications: Scheduled Meds:  sodium chloride   Intravenous Once   Chlorhexidine Gluconate Cloth  6 each Topical Daily   docusate sodium  100 mg Oral BID   heparin injection (subcutaneous)  5,000 Units Subcutaneous Q8H   insulin aspart  2-6 Units Subcutaneous Q4H   sodium chloride flush  3 mL Intravenous Q12H   sodium chloride flush  5 mL Intracatheter Q8H   Continuous Infusions:  dextrose 5% lactated ringers 75 mL/hr at 10/26/23 0651   famotidine (PEPCID) IV Stopped (10/25/23 2028)   linezolid (ZYVOX) IV Stopped (10/26/23 1113)   micafungin (MYCAMINE) 100 mg in sodium chloride 0.9 % 100 mL IVPB Stopped (10/25/23 1915)   norepinephrine (LEVOPHED) Adult infusion Stopped (10/26/23 0305)   piperacillin-tazobactam (ZOSYN)  IV 3.375 g (10/26/23 0851)   PRN Meds:.acetaminophen **OR** acetaminophen, HYDROmorphone (DILAUDID)  injection, ipratropium-albuterol, lip balm, methocarbamol **OR** methocarbamol (ROBAXIN) injection, metoCLOPramide **OR** metoCLOPramide (REGLAN) injection, ondansetron **OR** ondansetron (ZOFRAN) IV, mouth rinse, oxyCODONE    Objective: Weight change:   Intake/Output Summary (Last 24 hours) at 10/26/2023 1128 Last data filed at 10/26/2023 0857 Gross per 24 hour  Intake 3857.35 ml  Output 2875 ml  Net 982.35 ml   Blood pressure (!) 106/35, pulse 77, temperature 99.5 F (37.5 C), resp. rate (!) 22, height 5\' 5"  (1.651 m), weight 75.5 kg, SpO2 99%. Temp:  [98.6 F (37 C)-101.1 F (38.4 C)] 99.5 F (37.5 C) (01/27 1100) Resp:  [11-31] 22 (01/27 1100) BP: (94-118)/(35-43) 106/35 (01/27 0800) SpO2:  [99 %-100 %] 99 % (01/27 0400)  Physical Exam: Physical Exam Constitutional:      General: She is not in acute distress.    Appearance: She is well-developed. She is not diaphoretic.  HENT:     Head:  Normocephalic and atraumatic.     Right Ear: External ear normal.     Left Ear: External ear normal.     Mouth/Throat:     Pharynx: No oropharyngeal exudate.  Eyes:     General: No scleral icterus.    Conjunctiva/sclera: Conjunctivae normal.     Pupils: Pupils are equal, round, and reactive to light.  Cardiovascular:     Rate and Rhythm: Normal rate and regular rhythm.     Heart sounds: No murmur heard. Pulmonary:     Effort: Pulmonary effort is normal. No respiratory distress.     Breath sounds: No wheezing.  Abdominal:     Palpations: Abdomen is soft.  Musculoskeletal:        General: No tenderness. Normal range of motion.  Lymphadenopathy:     Cervical: No cervical adenopathy.  Skin:    General: Skin is warm and dry.     Coloration: Skin is not pale.     Findings: No erythema or rash.  Neurological:     General: No focal deficit present.     Mental Status: She is alert and oriented to person, place, and time.     Motor: No abnormal muscle tone.     Coordination: Coordination normal.  Psychiatric:        Mood and Affect: Mood normal.        Behavior: Behavior normal.        Thought Content: Thought content normal.        Judgment: Judgment normal.      Leg wrapped  Drain with feculent material  CBC:    BMET Recent Labs    10/25/23 2102 10/26/23 0311  NA 136 133*  K 3.7 3.1*  CL 104 102  CO2 22 23  GLUCOSE 116* 138*  BUN 12 11  CREATININE 0.52 <0.30*  CALCIUM 7.9* 7.8*     Liver Panel  Recent Labs    10/23/23 2337 10/24/23 0410  PROT 8.0 5.8*  ALBUMIN 2.2* 1.6*  AST 64* 51*  ALT 36 30  ALKPHOS 119 86  BILITOT 1.3* 0.7  BILIDIR  --  0.3*  IBILI  --  0.4       Sedimentation Rate No results for input(s): "ESRSEDRATE" in the last 72 hours. C-Reactive Protein No results for input(s): "CRP" in the last 72 hours.  Micro Results: Recent Results (from the past 720 hours)  Resp panel by RT-PCR (RSV, Flu A&B, Covid) Anterior Nasal Swab      Status: None   Collection Time: 10/23/23 11:37 PM   Specimen:  Anterior Nasal Swab  Result Value Ref Range Status   SARS Coronavirus 2 by RT PCR NEGATIVE NEGATIVE Final    Comment: (NOTE) SARS-CoV-2 target nucleic acids are NOT DETECTED.  The SARS-CoV-2 RNA is generally detectable in upper respiratory specimens during the acute phase of infection. The lowest concentration of SARS-CoV-2 viral copies this assay can detect is 138 copies/mL. A negative result does not preclude SARS-Cov-2 infection and should not be used as the sole basis for treatment or other patient management decisions. A negative result may occur with  improper specimen collection/handling, submission of specimen other than nasopharyngeal swab, presence of viral mutation(s) within the areas targeted by this assay, and inadequate number of viral copies(<138 copies/mL). A negative result must be combined with clinical observations, patient history, and epidemiological information. The expected result is Negative.  Fact Sheet for Patients:  BloggerCourse.com  Fact Sheet for Healthcare Providers:  SeriousBroker.it  This test is no t yet approved or cleared by the [place] FDA and  has been authorized for detection and/or diagnosis of SARS-CoV-2 by FDA under an Emergency Use Authorization (EUA). This EUA will remain  in effect (meaning this test can be used) for the duration of the COVID-19 declaration under Section 564(b)(1) of the Act, 21 U.S.C.section 360bbb-3(b)(1), unless the authorization is terminated  or revoked sooner.       Influenza A by PCR NEGATIVE NEGATIVE Final   Influenza B by PCR NEGATIVE NEGATIVE Final    Comment: (NOTE) The Xpert Xpress SARS-CoV-2/FLU/RSV plus assay is intended as an aid in the diagnosis of influenza from Nasopharyngeal swab specimens and should not be used as a sole basis for treatment. Nasal washings and aspirates are  unacceptable for Xpert Xpress SARS-CoV-2/FLU/RSV testing.  Fact Sheet for Patients: BloggerCourse.com  Fact Sheet for Healthcare Providers: SeriousBroker.it  This test is not yet approved or cleared by the [place] FDA and has been authorized for detection and/or diagnosis of SARS-CoV-2 by FDA under an Emergency Use Authorization (EUA). This EUA will remain in effect (meaning this test can be used) for the duration of the COVID-19 declaration under Section 564(b)(1) of the Act, 21 U.S.C. section 360bbb-3(b)(1), unless the authorization is terminated or revoked.     Resp Syncytial Virus by PCR NEGATIVE NEGATIVE Final    Comment: (NOTE) Fact Sheet for Patients: BloggerCourse.com  Fact Sheet for Healthcare Providers: SeriousBroker.it  This test is not yet approved or cleared by the [place] FDA and has been authorized for detection and/or diagnosis of SARS-CoV-2 by FDA under an Emergency Use Authorization (EUA). This EUA will remain in effect (meaning this test can be used) for the duration of the COVID-19 declaration under Section 564(b)(1) of the Act, 21 U.S.C. section 360bbb-3(b)(1), unless the authorization is terminated or revoked.  Performed at [facility], [address] [place], [place] [address]   Blood Culture (routine x 2)     Status: Abnormal   Collection Time: 10/23/23 11:37 PM   Specimen: BLOOD  Result Value Ref Range Status   Specimen Description   Final    BLOOD LEFT ANTECUBITAL Performed at [facility], [address] [place], [place] [address]    Special Requests   Final    BOTTLES DRAWN AEROBIC AND ANAEROBIC Blood Culture results may not be optimal due to an inadequate volume of blood received in culture bottles Performed at [facility] [address], [place], [place] [address]     Culture  Setup  Time   Final    GRAM POSITIVE COCCI IN CLUSTERS AEROBIC BOTTLE ONLY CRITICAL RESULT CALLED TO, READ BACK BY AND VERIFIED WITH: PHARMD E JACKSON 10/24/2023 @ 2345 BY AB    Culture (A)  Final    STAPHYLOCOCCUS EPIDERMIDIS THE SIGNIFICANCE OF ISOLATING THIS ORGANISM FROM A SINGLE SET OF BLOOD CULTURES WHEN MULTIPLE SETS ARE DRAWN IS UNCERTAIN. PLEASE NOTIFY THE MICROBIOLOGY DEPARTMENT WITHIN ONE WEEK IF SPECIATION AND SENSITIVITIES ARE REQUIRED. Performed at [facility], [address], [place], [place] [address]    Report Status 10/25/2023 FINAL  Final  Blood Culture ID Panel (Reflexed)     Status: Abnormal   Collection Time: 10/23/23 11:37 PM  Result Value Ref Range Status   Enterococcus faecalis NOT DETECTED NOT DETECTED Final   Enterococcus Faecium NOT DETECTED NOT DETECTED Final   Listeria monocytogenes NOT DETECTED NOT DETECTED Final   Staphylococcus species DETECTED (A) NOT DETECTED Final    Comment: CRITICAL RESULT CALLED TO, READ BACK BY AND VERIFIED WITH: PHARMD E JACKSON 10/24/2023 @ 2345 BY AB    Staphylococcus aureus (BCID) NOT DETECTED NOT DETECTED Final   Staphylococcus epidermidis DETECTED (A) NOT DETECTED Final    Comment: Methicillin (oxacillin) resistant coagulase negative staphylococcus. Possible blood culture contaminant (unless isolated from more than one blood culture draw or clinical case suggests pathogenicity). No antibiotic treatment is indicated for blood  culture contaminants. CRITICAL RESULT CALLED TO, READ BACK BY AND VERIFIED WITH: PHARMD E JACKSON 10/24/2023 @ 2345 BY AB    Staphylococcus lugdunensis NOT DETECTED NOT DETECTED Final   Streptococcus species NOT DETECTED NOT DETECTED Final   Streptococcus agalactiae NOT DETECTED NOT DETECTED Final   Streptococcus pneumoniae NOT DETECTED NOT DETECTED Final   Streptococcus pyogenes NOT DETECTED NOT DETECTED Final   A.calcoaceticus-baumannii NOT DETECTED NOT DETECTED Final   Bacteroides  fragilis NOT DETECTED NOT DETECTED Final   Enterobacterales NOT DETECTED NOT DETECTED Final   Enterobacter cloacae complex NOT DETECTED NOT DETECTED Final   Escherichia coli NOT DETECTED NOT DETECTED Final   Klebsiella aerogenes NOT DETECTED NOT DETECTED Final   Klebsiella oxytoca NOT DETECTED NOT DETECTED Final   Klebsiella pneumoniae NOT DETECTED NOT DETECTED Final   Proteus species NOT DETECTED NOT DETECTED Final   Salmonella species NOT DETECTED NOT DETECTED Final   Serratia marcescens NOT DETECTED NOT DETECTED Final   Haemophilus influenzae NOT DETECTED NOT DETECTED Final   Neisseria meningitidis NOT DETECTED NOT DETECTED Final   Pseudomonas aeruginosa NOT DETECTED NOT DETECTED Final   Stenotrophomonas maltophilia NOT DETECTED NOT DETECTED Final   Candida albicans NOT DETECTED NOT DETECTED Final   Candida auris NOT DETECTED NOT DETECTED Final   Candida glabrata NOT DETECTED NOT DETECTED Final   Candida krusei NOT DETECTED NOT DETECTED Final   Candida parapsilosis NOT DETECTED NOT DETECTED Final   Candida tropicalis NOT DETECTED NOT DETECTED Final   Cryptococcus neoformans/gattii NOT DETECTED NOT DETECTED Final   Methicillin resistance mecA/C DETECTED (A) NOT DETECTED Final    Comment: CRITICAL RESULT CALLED TO, READ BACK BY AND VERIFIED WITH: PHARMD E JACKSON 10/24/2023 @ 2345 BY AB Performed at [facility], [address], [place], [place] [address]   Blood Culture (routine x 2)     Status: None (Preliminary result)   Collection Time: 10/24/23 12:41 AM   Specimen: BLOOD  Result Value Ref Range Status   Specimen Description   Final    BLOOD BLOOD RIGHT ARM Performed at [facility], [address], [place], [place] [address]    Special Requests   Final    BOTTLES DRAWN AEROBIC AND ANAEROBIC Blood Culture results may not be optimal due to an inadequate volume of blood received in culture bottles Performed at [facility], [address], [place], [place] [address]    Culture   Final    NO GROWTH 2 DAYS Performed at [facility], [address], [place], [place] [address]    Report Status PENDING  Incomplete  MRSA Next Gen by PCR, Nasal     Status: None   Collection Time: 10/24/23  1:02 PM   Specimen: Nasal Mucosa; Nasal Swab  Result Value Ref Range Status   MRSA by PCR Next Gen NOT DETECTED NOT DETECTED Final    Comment: (NOTE) The GeneXpert MRSA Assay (FDA approved for NASAL specimens only), is one component of a comprehensive MRSA colonization surveillance program. It is not intended to diagnose MRSA infection nor to guide or monitor treatment for MRSA infections. Test performance is not FDA approved in patients less than 96 years old. Performed at [facility], [address], [place], [place] [address]   Aerobic/Anaerobic Culture w Gram Stain (surgical/deep wound)     Status: None (Preliminary result)   Collection Time: 10/24/23  4:22 PM   Specimen: Abscess  Result Value Ref Range Status   Specimen Description   Final    ABSCESS DRAIN Performed at [facility], [address] [place], [place] [address]    Special Requests   Final    NONE Performed at [facility], [address], [place], [place] [address]    Gram Stain   Final    FEW WBC PRESENT, PREDOMINANTLY PMN ABUNDANT GRAM POSITIVE COCCI MODERATE GRAM NEGATIVE RODS MODERATE GRAM POSITIVE RODS    Culture   Final    ABUNDANT GRAM NEGATIVE RODS CULTURE REINCUBATED FOR BETTER GROWTH Performed at [facility], [address] [place], [place] [address]    Report Status PENDING  Incomplete  Aerobic/Anaerobic Culture w Gram Stain (surgical/deep wound)     Status: None (Preliminary result)   Collection Time: 10/24/23  9:44 PM   Specimen: Abscess; Wound  Result Value Ref Range Status   Specimen Description   Final    ABSCESS Performed at [facility],  [address] [place], [place] [address]    Special Requests   Final    ABSCESS Performed at [facility], [address], [place], [place] [address]    Gram Stain   Final    FEW WBC PRESENT, PREDOMINANTLY PMN MODERATE GRAM NEGATIVE RODS MODERATE GRAM POSITIVE COCCI    Culture   Final    MODERATE GRAM NEGATIVE RODS CULTURE REINCUBATED FOR BETTER GROWTH Performed at [facility], [address], [place], [place] [address]    Report Status PENDING  Incomplete  Aerobic/Anaerobic Culture w Gram Stain (surgical/deep wound)     Status: None (Preliminary result)   Collection Time: 10/24/23  9:47 PM   Specimen: Path Tissue  Result Value Ref Range Status   Specimen Description   Final    TISSUE Performed at [facility], [address], [place], [place] [address]    Special Requests   Final    TISSUE Performed at [facility], [address] [place], [place] [address]    Gram Stain   Final  RARE WBC PRESENT, PREDOMINANTLY PMN FEW GRAM POSITIVE COCCI RARE GRAM NEGATIVE RODS    Culture   Final    MODERATE GRAM NEGATIVE RODS MODERATE KLEBSIELLA OXYTOCA SUSCEPTIBILITIES TO FOLLOW Performed at [facility], [address] [place], [place] [address]    Report Status PENDING  Incomplete    Studies/Results: DG Chest 1 View Result Date: 10/24/2023 CLINICAL DATA:  Dyspnea.  Central line placement. EXAM: CHEST  1 VIEW COMPARISON:  Radiographs yesterday, chest CT earlier today FINDINGS: Left internal jugular central venous catheter tip overlies the right atrium, partially obscured by overlying monitoring devices. No pneumothorax. Overall low lung volumes. No focal airspace disease, large pleural effusion or pulmonary edema. The heart is normal in size. IMPRESSION: Left internal jugular central venous catheter tip overlies the right atrium. No pneumothorax. Electronically Signed   By: Narda Rutherford M.D.   On:  10/24/2023 20:47   CT GUIDED PERITONEAL/RETROPERITONEAL FLUID DRAIN BY [facility] CATH Result Date: 10/24/2023 CLINICAL DATA:  Diverticular abscess EXAM: CT GUIDED DRAINAGE OF PELVIC ABSCESS ANESTHESIA/SEDATION: Intravenous Fentanyl and Versed 1mg  were administered by RN during a total moderate (conscious) sedation time of 20 minutes; the patient's level of consciousness and physiological / cardiorespiratory status were monitored continuously by radiology RN under my direct supervision. PROCEDURE: The procedure, risks, benefits, and alternatives were explained to the patient. Questions regarding the procedure were encouraged and answered. The patient understands and consents to the procedure. select axial scans through the pelvis were obtained. the complex left pelvic fluid collection abutting the urinary bladder was localized and an appropriate skin entry site was determined and marked. The operative field was prepped with chlorhexidinein a sterile fashion, and a sterile drape was applied covering the operative field. A sterile gown and sterile gloves were used for the procedure. Local anesthesia was provided with 1% Lidocaine. Under CT fluoroscopic guidance, 18 gauge trocar needle advanced to the collection. Amplatz guidewire advanced easily within the collection, confirmed on CT fluoroscopy. Tract dilated to facilitate placement of a 12 French pigtail drain catheter, formed centrally within the collection. A 10 mL of feculent material were aspirated, sent for Gram stain and culture. Catheter secured externally 0 Prolene sutures and StatLock and placed to gravity drain bag. The patient tolerated the procedure well. RADIATION DOSE REDUCTION: This exam was performed according to the departmental dose-optimization program which includes automated exposure control, adjustment of the mA and/or kV according to patient size and/or use of iterative reconstruction technique. COMPLICATIONS: None immediate FINDINGS:  Complex gas and debris filled collection in the low left pelvis abutting the urinary bladder is again localized. Deep gas in the fascial planes of the proximal left thigh also noted, distal extent not visualized. 12 French pigtail drain catheter placed as above. Feculent aspirate sent for Gram stain and culture. IMPRESSION: 1. Technically successful CT-guided pelvic abscess drain catheter placement. Feculent aspirate sent for Gram stain and culture. 2. Gas bubbles dissecting through deep muscular and fascial planes of the proximal left thigh. This finding was telephoned to Dr. Natale Milch at the beginning of the procedure. Electronically Signed   By: Corlis Leak M.D.   On: 10/24/2023 19:22   CT EXTREMITY LOWER LEFT WO CONTRAST Result Date: 10/24/2023 CLINICAL DATA:  Recent drain placement and abdomen. Scan due to pain, swelling, and subcutaneous free air. Evaluate for necrotizing fasciitis. EXAM: CT OF THE LOWER LEFT EXTREMITY WITHOUT CONTRAST TECHNIQUE: Multidetector CT imaging of the lower left extremity was performed according to the standard protocol. RADIATION DOSE  REDUCTION: This exam was performed according to the departmental dose-optimization program which includes automated exposure control, adjustment of the mA and/or kV according to patient size and/or use of iterative reconstruction technique. COMPARISON:  CT chest, abdomen, and pelvis 10/24/2023 FINDINGS: Bones/Joint/Cartilage No cortical erosion is seen.  No acute fracture. There are individual screws within the distal aspect of the first and fifth metatarsals. Ligaments Suboptimally assessed by CT. Muscles and Tendons There is again air seen tracking along the sartorius and the left groin musculature as on CT of the chest, abdomen, and pelvis earlier today. There is extension of this air within the fascial planes surrounding these muscles throughout the more distal thigh, and air is seen around fascial planes within the sartorius, anterior left  quadriceps, obturator externus, adductor longus, brevis, and magnus. Within the mid to distal thigh air surrounds the hamstring fascial planes diffusely. Within the calf, air surrounds the fascial planes of the medial greater than lateral heads of the gastrocnemius muscles to the level of the ankle. Findings are concerning for necrotizing fasciitis diffusely throughout the left lower leg. Within the thigh, although the anterior superficial portion of the quadriceps musculature appears affected, and the deep fascial planes of the quadriceps musculature do not demonstrate air. Within the calf, the anterior compartment and deep posterior compartment appear less affected. Soft tissues There is moderate diffuse subcutaneous fat edema and swelling throughout the left thigh and calf. There is density likely representing confluent edema lateral to the knee (axial series 6, image 319 and coronal series 9, image 118). No walled-off abscess is seen. There is a new anterior left pelvic approach drainage catheter terminating within the region of the complex collection containing air and fluid within left hemipelvis on CT chest, abdomen, pelvis earlier today. That suspected abscess adjacent to the wall thickening within the sigmoid colon is markedly decreased from prior. A Foley catheter is seen within the urinary bladder which contains contrast. IMPRESSION: 1. There is extensive air tracking along the fascial planes of the left hip, groin, thigh, and posterior calf musculature concerning for diffuse necrotizing fasciitis extending down to the level of the ankle. 2. There is moderate diffuse subcutaneous fat edema and swelling throughout the left thigh and calf. There is density likely representing confluent edema lateral to the knee. No walled-off abscess is seen. 3. There is a new anterior left pelvic approach drainage catheter terminating within the region of the complex collection containing air and fluid within left  hemipelvis on CT chest, abdomen, pelvis earlier today. That suspected abscess adjacent to the wall thickening within the sigmoid colon is markedly decreased from prior. Critical Value/emergent results were called by telephone at the time of interpretation on 10/24/2023 at 6:12 pm to provider Carma Leaven , who verbally acknowledged these results. Electronically Signed   By: Neita Garnet M.D.   On: 10/24/2023 18:15      Assessment/Plan:  INTERVAL HISTORY: pt potentially transferring to [facility] per pt and family   Principal Problem:   Diverticulitis of large intestine with abscess Active Problems:   Infective myositis of left thigh   AKI (acute kidney injury) (HCC)   PAD (peripheral artery disease) (HCC)   Hyperglycemia   Protein-calorie malnutrition, severe    Ashley Galloway is a 73 y.o. female with vascular disease prior diverticulitis with hepatic abscess s/p IR guided drain who had been treated for Streptococcus intermedius hepatic abscess and bacteremia in December 2024.  She had finished ceftriaxone and metronidazole through October 14, 2023 and  then on cefadroxil for an additional 2 weeks.  She developed nausea and vomiting.  In the interim she had also developed left leg pain.  She was admitted to the hospital with septic shock.  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 A new 6.9 cm collection of likely fecal material and air seen in the LLQ abutting the sigmoid colon concerning for perforation with abscess, liver still with ill defined lesion though smaller than before. She had fluid and air on CT that was tracking into the abductor muscle concerning for myositis.  CT of the lower extremity showed extensive air tracking along the fascial planes  left hip groin thigh and posterior calf musculature concerning for necrotizing fasciitis.  Went incision and debridement of left thigh abscess and medial thigh in the evening of the 25th.  Apparently there was dishwater foul-smelling fluid and devitalized fascial tissue seen and cultures were sent.  She has also had IR guided drain placed into feculent material from LLQ  Her blood cultures are growing MRSE and from 1 of 2 sites that have been isolated her antimicrobials have been broadened from vancomycin Zosyn and clindamycin to Zosyn Zyvox and micafungin by my partner Dr. Drue Second  Cultures are now showing Klebsiella oxytoca from abscess culture on the 25th as well as Klebsiella oxytoca Enterococcus faecium and moderate Pseudomonas aeruginosa from another abscess from same date  From thigh also growing Klebsiella oxytoca  #1 Diverticular abscess with extension of infection into left thigh and leg with passing fasciitis status post I&D  This is polymicrobial process  E faecium is more likely to be R to AMP than E faecalis but it will be covered by zyvox regardless  We will use Zosyn, zyvox and micafungin--though likely DC this is no fungal organism grows  My understanding she is going back for further debridement of her leg I also am under the impression that no surgical intervention is going to be undertaken with regards to her abdomen and that this will be managed with drain and antibiotics.  I have personally spent 54  minutes involved in face-to-face and non-face-to-face activities for this patient on the day of the visit. Professional time spent includes the following activities: Preparing to see the patient (review of tests), Obtaining and/or reviewing separately obtained history (admission/discharge record), Performing a medically appropriate examination and/or evaluation , Ordering medications/tests/procedures, referring and communicating with other health care professionals, Documenting  clinical information in the EMR, Independently interpreting results (not separately reported), Communicating results to the patient/family/caregiver, Counseling and educating the patient/family/caregiver and Care coordination (not separately reported).    Evaluation of the patient requires complex antimicrobial therapy evaluation and counseling as well as determining isolation needs for infectious disease transmission risk assessment and mitigation     LOS: 2 days   Acey Lav 10/26/2023, 11:28 AM

## 2023-10-27 DIAGNOSIS — A419 Sepsis, unspecified organism: Secondary | ICD-10-CM | POA: Diagnosis not present

## 2023-10-27 DIAGNOSIS — A499 Bacterial infection, unspecified: Secondary | ICD-10-CM

## 2023-10-27 DIAGNOSIS — K5720 Diverticulitis of large intestine with perforation and abscess without bleeding: Secondary | ICD-10-CM | POA: Diagnosis not present

## 2023-10-27 DIAGNOSIS — M60052 Infective myositis, left thigh: Secondary | ICD-10-CM | POA: Diagnosis not present

## 2023-10-27 DIAGNOSIS — E43 Unspecified severe protein-calorie malnutrition: Secondary | ICD-10-CM

## 2023-10-27 DIAGNOSIS — M726 Necrotizing fasciitis: Secondary | ICD-10-CM

## 2023-10-27 DIAGNOSIS — E440 Moderate protein-calorie malnutrition: Secondary | ICD-10-CM

## 2023-10-27 DIAGNOSIS — M7989 Other specified soft tissue disorders: Secondary | ICD-10-CM | POA: Diagnosis not present

## 2023-10-27 DIAGNOSIS — I739 Peripheral vascular disease, unspecified: Secondary | ICD-10-CM | POA: Diagnosis not present

## 2023-10-27 DIAGNOSIS — K651 Peritoneal abscess: Secondary | ICD-10-CM | POA: Diagnosis not present

## 2023-10-27 LAB — GLUCOSE, CAPILLARY
Glucose-Capillary: 119 mg/dL — ABNORMAL HIGH (ref 70–99)
Glucose-Capillary: 112 mg/dL — ABNORMAL HIGH (ref 70–99)
Glucose-Capillary: 187 mg/dL — ABNORMAL HIGH (ref 70–99)
Glucose-Capillary: 120 mg/dL — ABNORMAL HIGH (ref 70–99)
Glucose-Capillary: 138 mg/dL — ABNORMAL HIGH (ref 70–99)
Glucose-Capillary: 120 mg/dL — ABNORMAL HIGH (ref 70–99)

## 2023-10-27 LAB — AEROBIC/ANAEROBIC CULTURE W GRAM STAIN (SURGICAL/DEEP WOUND)

## 2023-10-27 LAB — CBC
HCT: 23.1 % — ABNORMAL LOW (ref 36.0–46.0)
Hemoglobin: 7.8 g/dL — ABNORMAL LOW (ref 12.0–15.0)
MCH: 28.8 pg (ref 26.0–34.0)
MCHC: 33.8 g/dL (ref 30.0–36.0)
MCV: 85.2 fL (ref 80.0–100.0)
Platelets: 221 10*3/uL (ref 150–400)
RBC: 2.71 MIL/uL — ABNORMAL LOW (ref 3.87–5.11)
RDW: 14.7 % (ref 11.5–15.5)
WBC: 12.3 10*3/uL — ABNORMAL HIGH (ref 4.0–10.5)
nRBC: 0.2 % (ref 0.0–0.2)

## 2023-10-27 LAB — BASIC METABOLIC PANEL
Anion gap: 10 (ref 5–15)
BUN: 12 mg/dL (ref 8–23)
CO2: 24 mmol/L (ref 22–32)
Calcium: 7.6 mg/dL — ABNORMAL LOW (ref 8.9–10.3)
Chloride: 99 mmol/L (ref 98–111)
Creatinine, Ser: 0.51 mg/dL (ref 0.44–1.00)
GFR, Estimated: >60 mL/min (ref >60)
Glucose, Bld: 106 mg/dL — ABNORMAL HIGH (ref 70–99)
Potassium: 3 mmol/L — ABNORMAL LOW (ref 3.5–5.1)
Sodium: 133 mmol/L — ABNORMAL LOW (ref 135–145)

## 2023-10-27 LAB — MAGNESIUM: Magnesium: 2.1 mg/dL (ref 1.7–2.4)

## 2023-10-27 LAB — PHOSPHORUS: Phosphorus: 2.3 mg/dL — ABNORMAL LOW (ref 2.5–4.6)

## 2023-10-27 MED ORDER — MIDODRINE HCL 5 MG PO TABS
5.0000 mg | ORAL_TABLET | Freq: Three times a day (TID) | ORAL | Status: DC
  Administered 2023-10-27 – 2023-10-28 (×3): 5 mg via ORAL
  Filled 2023-10-27 (×4): qty 1

## 2023-10-27 MED ORDER — DEXTROSE-SODIUM CHLORIDE 10-0.45 % IV SOLN
INTRAVENOUS | Status: AC
  Filled 2023-10-27 (×2): qty 1000

## 2023-10-27 MED ORDER — POVIDONE-IODINE 10 % EX SWAB: 2.0000 | Freq: Once | CUTANEOUS | Status: DC

## 2023-10-27 MED ORDER — POTASSIUM CHLORIDE 10 MEQ/50ML IV SOLN
10.0000 meq | INTRAVENOUS | Status: AC
  Administered 2023-10-27 (×4): 10 meq via INTRAVENOUS
  Filled 2023-10-27 (×5): qty 50

## 2023-10-27 MED ORDER — ALBUMIN HUMAN 25 % IV SOLN
25.0000 g | Freq: Four times a day (QID) | INTRAVENOUS | Status: AC
  Administered 2023-10-27 (×3): 25 g via INTRAVENOUS
  Filled 2023-10-27 (×3): qty 100

## 2023-10-27 MED ORDER — POTASSIUM CHLORIDE 10 MEQ/100ML IV SOLN: 10.0000 meq | INTRAVENOUS | Status: DC

## 2023-10-27 MED ORDER — CHLORHEXIDINE GLUCONATE 4 % EX SOLN
60.0000 mL | Freq: Once | CUTANEOUS | Status: DC
  Filled 2023-10-27: qty 60

## 2023-10-27 MED ORDER — POTASSIUM CHLORIDE 10 MEQ/50ML IV SOLN
10.0000 meq | INTRAVENOUS | Status: AC
  Administered 2023-10-27 (×6): 10 meq via INTRAVENOUS
  Filled 2023-10-27 (×6): qty 50

## 2023-10-27 NOTE — Progress Notes (Signed)
NAME:  Ashley Galloway, MRN:  [id number], DOB:  10/08/1950, LOS: 3 ADMISSION DATE:  10/23/2023, CONSULTATION DATE:  10/24/2023 REFERRING MD: Azucena Fallen, MD, CHIEF COMPLAINT: Septic shock since 4 pm   History of Present Illness:  A 73 y.o. female with HTN, dyslipidemia, PVD, COPD, tobacco smoking (quit few months ago), recent Streptococcus intermedius bacteremia, hepatic abscess (percutaneous drain d/c on 10/20/2022), and diverticulitis in Dec 2024 who completed antibiotics few days ago. She presented to ER 10/24/2023 in am with c/o LLQ swelling and erythema, and left thigh pain for few days. She has fever, chills, general malaise and loss of appetite, but denies chest pain, cough, SOB, wheezing, or N/V/D. She reports constipation.  New CT imaging finds new large LLQ abscess with associated diverticulitis as well as fluid/air into abductor muscle concerning for myositis.  Received 4.5 L LR and on her 5th L of LR boluses Started on IV abx: Vancomycin, Cefepime, and Flagyl--->Linezolid, Zosyn and Clindamycin. General surgery consulted and request IR, s/p perc drain to abdominal abscess. Developed Afib in ICU, new    Pertinent  Medical History  HTN, dyslipidemia, PVD, COPD, tobacco smoking (quit few months ago), recent Streptococcus intermedius bacteremia, hepatic abscess (percutaneous drain d/c on 10/20/2022), diverticulitis in Dec 2024  Significant Hospital Events: Including procedures, antibiotic start and stop dates in addition to other pertinent events   1/25 to OR for debridement of necrotizing fasciitis of LLE, IR placed LLQ drain for abscess  Interim History / Subjective:   Levophed restarted over night for MAP goal Afebrile over past 24 hours She is feeling better this morning Spoke with Orthopedic team yesterday, she is scheduled for the OR on Friday with Dr. Lajoyce Corners, will transfer to [facility] on Wednesday or Thursday for the surgery. General surgery working on definitive  plan for the abdominal source  Objective   Blood pressure (!) 136/46, pulse 71, temperature 99.3 F (37.4 C), resp. rate 14, height 5\' 5"  (1.651 m), weight 75.5 kg, SpO2 98%.        Intake/Output Summary (Last 24 hours) at 10/27/2023 0733 Last data filed at 10/27/2023 0865 Gross per 24 hour  Intake 3012.31 ml  Output 1425 ml  Net 1587.31 ml   Filed Weights   10/23/23 2328 10/24/23 1415  Weight: 71.3 kg 75.5 kg    Examination: Gen: no acute distress, laying bed HEENT: [place]/AT, moist mucous membranes, sclera anicteric Resp: clear to auscultation, no wheezing Abdomen: soft, JP drain with feculent drainage Extremities: Left leg is wrapped in dressing, distal extremities warm and well perfused  Leg wound cultures from OR - GPC, GNR on gram stain. Moderate Klebsiella oxytoca growth BCID - positive for staph and strep LLQ Drain culture Klebsiella oxytoca, enterococcus faecium, pseudomonas aeruginosa  Resolved Hospital Problem list   AKI Acute metabolic encephalopathy   Assessment & Plan:  Septic shock due to intra-abdominal abscess/diverticulitis and left thigh necrotizing fascitis.  S/p CT guided drain placement 1/25 for abdominal abscess - multiorganism S/p excision and debridement of LLE with wound vac placement 1/25 Staph/strep Bacteremia Complicated by recent strep intermedius bacteremia, hepatic abscess (drain was d/c 10/21/2023), and chronic diverticulosis. Finished PO Abx few days ago -General Surgery, ortho, IR following -Levophed for MAP>65 mmHg - continue linzeolid, zosyn and micafungin per ID - check random cortisol level - start midodrine 5mg  TID  New Afib due to sepsis 10/05/2023: Echo EF 60%, G1DD Optimize electroltes K>4, Mg>2 - back in NSR  COPD -Duoneb PRN   HTN and dyslipidemia -  Hold home meds  Moderate PCM -Dietitian consult - protein boost supplement  Hypokalemia Mild hypoNa -Monitor Na - replete potassium as needed  Hyperglycemia due to  sepsis Pre-DM (HbA1c 6.3%) - ISS - Glycemic control   Elevated INR: recent IV and PO Abx and sepsis now -trend   Anxiety: well-controlled  Lumbar stenosis, no current complaints  Peripheral arterial disease, chronic  Best Practice (right click and "Reselect all SmartList Selections" daily)   Diet/type: clear liquids DVT prophylaxis prophylactic heparin  Pressure ulcer(s): N/A GI prophylaxis: H2B Lines: Central line Foley:  Yes, and it is still needed Code Status:  full code Last date of multidisciplinary goals of care discussion [husband updated at bedside 1/26 am]   Critical care time:     The patient is critically ill due to septic shock.  Critical care was necessary to treat or prevent imminent or life-threatening deterioration.  Critical care was time spent personally by me on the following activities: development of treatment plan with patient and/or surrogate as well as nursing, discussions with consultants, evaluation of patient's response to treatment, examination of patient, obtaining history from patient or surrogate, ordering and performing treatments and interventions, ordering and review of laboratory studies, ordering and review of radiographic studies, pulse oximetry, re-evaluation of patient's condition and participation in multidisciplinary rounds.   Critical Care Time devoted to patient care services described in this note is 35 minutes. This time reflects time of care of this signee Martina Sinner . This critical care time does not reflect separately billable procedures or procedure time, teaching time or supervisory time of PA/NP/Med student/Med Resident etc but could involve care discussion time.       Melody Comas, MD [facility] Office: [phone]   See Amion for personal pager PCCM on call pager [phone] until 7pm. Please call Elink 7p-7a. [phone]

## 2023-10-27 NOTE — Progress Notes (Signed)
Subjective: No new complaints   Antibiotics:  Anti-infectives (From admission, onward)    Start     Dose/Rate Route Frequency Ordered Stop   10/26/23 0400  vancomycin (VANCOREADY) IVPB 1500 mg/300 mL  Status:  Discontinued        1,500 mg 150 mL/hr over 120 Minutes Intravenous Every 24 hours 10/25/23 0725 10/26/23 0754   10/26/23 0002  ceFAZolin (ANCEF) IVPB 2g/100 mL premix        2 g 200 mL/hr over 30 Minutes Intravenous Every 6 hours 10/26/23 0002 10/26/23 0650   10/25/23 2200  linezolid (ZYVOX) IVPB 600 mg        600 mg 300 mL/hr over 60 Minutes Intravenous Every 12 hours 10/25/23 1634     10/25/23 1800  micafungin (MYCAMINE) 100 mg in sodium chloride 0.9 % 100 mL IVPB  Status:  Discontinued        100 mg 105 mL/hr over 1 Hours Intravenous Every 24 hours 10/25/23 1645 10/27/23 1332   10/25/23 0215  vancomycin (VANCOREADY) IVPB 1500 mg/300 mL        1,500 mg 150 mL/hr over 120 Minutes Intravenous  Once 10/25/23 0121 10/25/23 0358   10/24/23 1000  linezolid (ZYVOX) IVPB 600 mg  Status:  Discontinued        600 mg 300 mL/hr over 60 Minutes Intravenous Every 12 hours 10/24/23 0224 10/24/23 2026   10/24/23 0800  piperacillin-tazobactam (ZOSYN) IVPB 3.375 g        3.375 g 12.5 mL/hr over 240 Minutes Intravenous Every 8 hours 10/24/23 0231     10/24/23 0600  clindamycin (CLEOCIN) IVPB 600 mg  Status:  Discontinued        600 mg 100 mL/hr over 30 Minutes Intravenous Every 8 hours 10/24/23 0224 10/25/23 1634   10/24/23 0230  piperacillin-tazobactam (ZOSYN) IVPB 3.375 g  Status:  Discontinued       Placed in "And" Linked Group   3.375 g 100 mL/hr over 30 Minutes Intravenous  Once 10/24/23 0224 10/24/23 0229   10/23/23 2345  ceFEPIme (MAXIPIME) 2 g in sodium chloride 0.9 % 100 mL IVPB        2 g 200 mL/hr over 30 Minutes Intravenous  Once 10/23/23 2337 10/24/23 0035   10/23/23 2345  metroNIDAZOLE (FLAGYL) IVPB 500 mg        500 mg 100 mL/hr over 60 Minutes Intravenous   Once 10/23/23 2337 10/24/23 0127   10/23/23 2345  vancomycin (VANCOCIN) IVPB 1000 mg/200 mL premix        1,000 mg 200 mL/hr over 60 Minutes Intravenous  Once 10/23/23 2337 10/24/23 0313       Medications: Scheduled Meds:  chlorhexidine  60 mL Topical Once   Chlorhexidine Gluconate Cloth  6 each Topical Daily   docusate sodium  100 mg Oral BID   feeding supplement  1 Container Oral TID BM   heparin injection (subcutaneous)  5,000 Units Subcutaneous Q8H   insulin aspart  2-6 Units Subcutaneous Q4H   midodrine  5 mg Oral TID WC   povidone-iodine  2 Application Topical Once   sodium chloride flush  3 mL Intravenous Q12H   sodium chloride flush  5 mL Intracatheter Q8H   Continuous Infusions:  albumin human 60 mL/hr at 10/27/23 1510   dextrose 5% lactated ringers 75 mL/hr at 10/27/23 1512   famotidine (PEPCID) IV Stopped (10/26/23 2156)   linezolid (ZYVOX) IV Stopped (10/27/23 1124)   norepinephrine (LEVOPHED)  Adult infusion 2 mcg/min (10/27/23 1510)   piperacillin-tazobactam (ZOSYN)  IV 3.375 g (10/27/23 1628)   PRN Meds:.acetaminophen **OR** acetaminophen, HYDROmorphone (DILAUDID) injection, ipratropium-albuterol, lip balm, methocarbamol **OR** methocarbamol (ROBAXIN) injection, metoCLOPramide **OR** metoCLOPramide (REGLAN) injection, ondansetron **OR** ondansetron (ZOFRAN) IV, mouth rinse, oxyCODONE    Objective: Weight change:   Intake/Output Summary (Last 24 hours) at 10/27/2023 1631 Last data filed at 10/27/2023 1510 Gross per 24 hour  Intake 4107.26 ml  Output 725 ml  Net 3382.26 ml   Blood pressure (!) 120/40, pulse 86, temperature 99.1 F (37.3 C), resp. rate (!) 23, height 5\' 5"  (1.651 m), weight 75.5 kg, SpO2 100%. Temp:  [98.6 F (37 C)-100 F (37.8 C)] 99.1 F (37.3 C) (01/28 1315) Pulse Rate:  [71-99] 86 (01/28 1315) Resp:  [11-25] 23 (01/28 1315) BP: (99-143)/(31-94) 120/40 (01/28 1315) SpO2:  [97 %-100 %] 100 % (01/28 1315)  Physical Exam: Physical  Exam Constitutional:      Appearance: Ashley Galloway is well-developed.  HENT:     Head: Normocephalic and atraumatic.     Right Ear: External ear normal.     Left Ear: External ear normal.     Mouth/Throat:     Pharynx: No oropharyngeal exudate.  Eyes:     General: No scleral icterus.    Conjunctiva/sclera: Conjunctivae normal.     Pupils: Pupils are equal, round, and reactive to light.  Cardiovascular:     Rate and Rhythm: Normal rate and regular rhythm.  Pulmonary:     Effort: Pulmonary effort is normal. No respiratory distress.     Breath sounds: Normal breath sounds. No wheezing.  Abdominal:     General: There is no distension.     Palpations: Abdomen is soft.  Lymphadenopathy:     Cervical: No cervical adenopathy.  Skin:    General: Skin is warm and dry.     Coloration: Skin is not pale.     Findings: No erythema or rash.  Neurological:     General: No focal deficit present.     Mental Status: Ashley Galloway is alert and oriented to person, place, and time.     Motor: No abnormal muscle tone.     Coordination: Coordination normal.  Psychiatric:        Mood and Affect: Mood normal.        Behavior: Behavior normal.        Thought Content: Thought content normal.        Judgment: Judgment normal.      Leg wrapped  Drain with feculent material  CBC:    BMET Recent Labs    10/26/23 0311 10/27/23 0559  NA 133* 133*  K 3.1* 3.0*  CL 102 99  CO2 23 24  GLUCOSE 138* 106*  BUN 11 12  CREATININE <0.30* 0.51  CALCIUM 7.8* 7.6*     Liver Panel  No results for input(s): "PROT", "ALBUMIN", "AST", "ALT", "ALKPHOS", "BILITOT", "BILIDIR", "IBILI" in the last 72 hours.      Sedimentation Rate No results for input(s): "ESRSEDRATE" in the last 72 hours. C-Reactive Protein No results for input(s): "CRP" in the last 72 hours.  Micro Results: Recent Results (from the past 720 hours)  Resp panel by RT-PCR (RSV, Flu A&B, Covid) Anterior Nasal Swab     Status: None   Collection  Time: 10/23/23 11:37 PM   Specimen: Anterior Nasal Swab  Result Value Ref Range Status   SARS Coronavirus 2 by RT PCR NEGATIVE NEGATIVE Final    Comment: (  NOTE) SARS-CoV-2 target nucleic acids are NOT DETECTED.  The SARS-CoV-2 RNA is generally detectable in upper respiratory specimens during the acute phase of infection. The lowest concentration of SARS-CoV-2 viral copies this assay can detect is 138 copies/mL. A negative result does not preclude SARS-Cov-2 infection and should not be used as the sole basis for treatment or other patient management decisions. A negative result may occur with  improper specimen collection/handling, submission of specimen other than nasopharyngeal swab, presence of viral mutation(s) within the areas targeted by this assay, and inadequate number of viral copies(<138 copies/mL). A negative result must be combined with clinical observations, patient history, and epidemiological information. The expected result is Negative.  Fact Sheet for Patients:  BloggerCourse.com  Fact Sheet for Healthcare Providers:  SeriousBroker.it  This test is no t yet approved or cleared by the [place] FDA and  has been authorized for detection and/or diagnosis of SARS-CoV-2 by FDA under an Emergency Use Authorization (EUA). This EUA will remain  in effect (meaning this test can be used) for the duration of the COVID-19 declaration under Section 564(b)(1) of the Act, 21 U.S.C.section 360bbb-3(b)(1), unless the authorization is terminated  or revoked sooner.       Influenza A by PCR NEGATIVE NEGATIVE Final   Influenza B by PCR NEGATIVE NEGATIVE Final    Comment: (NOTE) The Xpert Xpress SARS-CoV-2/FLU/RSV plus assay is intended as an aid in the diagnosis of influenza from Nasopharyngeal swab specimens and should not be used as a sole basis for treatment. Nasal washings and aspirates are unacceptable for Xpert Xpress  SARS-CoV-2/FLU/RSV testing.  Fact Sheet for Patients: BloggerCourse.com  Fact Sheet for Healthcare Providers: SeriousBroker.it  This test is not yet approved or cleared by the [place] FDA and has been authorized for detection and/or diagnosis of SARS-CoV-2 by FDA under an Emergency Use Authorization (EUA). This EUA will remain in effect (meaning this test can be used) for the duration of the COVID-19 declaration under Section 564(b)(1) of the Act, 21 U.S.C. section 360bbb-3(b)(1), unless the authorization is terminated or revoked.     Resp Syncytial Virus by PCR NEGATIVE NEGATIVE Final    Comment: (NOTE) Fact Sheet for Patients: BloggerCourse.com  Fact Sheet for Healthcare Providers: SeriousBroker.it  This test is not yet approved or cleared by the [place] FDA and has been authorized for detection and/or diagnosis of SARS-CoV-2 by FDA under an Emergency Use Authorization (EUA). This EUA will remain in effect (meaning this test can be used) for the duration of the COVID-19 declaration under Section 564(b)(1) of the Act, 21 U.S.C. section 360bbb-3(b)(1), unless the authorization is terminated or revoked.  Performed at [facility], [address] [place], [place] [address]   Blood Culture (routine x 2)     Status: Abnormal   Collection Time: 10/23/23 11:37 PM   Specimen: BLOOD  Result Value Ref Range Status   Specimen Description   Final    BLOOD LEFT ANTECUBITAL Performed at [facility], [address], [place], [place] [address]    Special Requests   Final    BOTTLES DRAWN AEROBIC AND ANAEROBIC Blood Culture results may not be optimal due to an inadequate volume of blood received in culture bottles Performed at [facility], [address] [place], [place] [address]    Culture  Setup Time   Final     GRAM POSITIVE COCCI IN CLUSTERS AEROBIC BOTTLE ONLY CRITICAL RESULT CALLED TO, READ BACK BY AND VERIFIED  WITH: PHARMD E JACKSON 10/24/2023 @ 2345 BY AB    Culture (A)  Final    STAPHYLOCOCCUS EPIDERMIDIS THE SIGNIFICANCE OF ISOLATING THIS ORGANISM FROM A SINGLE SET OF BLOOD CULTURES WHEN MULTIPLE SETS ARE DRAWN IS UNCERTAIN. PLEASE NOTIFY THE MICROBIOLOGY DEPARTMENT WITHIN ONE WEEK IF SPECIATION AND SENSITIVITIES ARE REQUIRED. Performed at [facility], [address] [place], [place] [address]    Report Status 10/25/2023 FINAL  Final  Blood Culture ID Panel (Reflexed)     Status: Abnormal   Collection Time: 10/23/23 11:37 PM  Result Value Ref Range Status   Enterococcus faecalis NOT DETECTED NOT DETECTED Final   Enterococcus Faecium NOT DETECTED NOT DETECTED Final   Listeria monocytogenes NOT DETECTED NOT DETECTED Final   Staphylococcus species DETECTED (A) NOT DETECTED Final    Comment: CRITICAL RESULT CALLED TO, READ BACK BY AND VERIFIED WITH: PHARMD E JACKSON 10/24/2023 @ 2345 BY AB    Staphylococcus aureus (BCID) NOT DETECTED NOT DETECTED Final   Staphylococcus epidermidis DETECTED (A) NOT DETECTED Final    Comment: Methicillin (oxacillin) resistant coagulase negative staphylococcus. Possible blood culture contaminant (unless isolated from more than one blood culture draw or clinical case suggests pathogenicity). No antibiotic treatment is indicated for blood  culture contaminants. CRITICAL RESULT CALLED TO, READ BACK BY AND VERIFIED WITH: PHARMD E JACKSON 10/24/2023 @ 2345 BY AB    Staphylococcus lugdunensis NOT DETECTED NOT DETECTED Final   Streptococcus species NOT DETECTED NOT DETECTED Final   Streptococcus agalactiae NOT DETECTED NOT DETECTED Final   Streptococcus pneumoniae NOT DETECTED NOT DETECTED Final   Streptococcus pyogenes NOT DETECTED NOT DETECTED Final   A.calcoaceticus-baumannii NOT DETECTED NOT DETECTED Final   Bacteroides fragilis NOT DETECTED NOT DETECTED  Final   Enterobacterales NOT DETECTED NOT DETECTED Final   Enterobacter cloacae complex NOT DETECTED NOT DETECTED Final   Escherichia coli NOT DETECTED NOT DETECTED Final   Klebsiella aerogenes NOT DETECTED NOT DETECTED Final   Klebsiella oxytoca NOT DETECTED NOT DETECTED Final   Klebsiella pneumoniae NOT DETECTED NOT DETECTED Final   Proteus species NOT DETECTED NOT DETECTED Final   Salmonella species NOT DETECTED NOT DETECTED Final   Serratia marcescens NOT DETECTED NOT DETECTED Final   Haemophilus influenzae NOT DETECTED NOT DETECTED Final   Neisseria meningitidis NOT DETECTED NOT DETECTED Final   Pseudomonas aeruginosa NOT DETECTED NOT DETECTED Final   Stenotrophomonas maltophilia NOT DETECTED NOT DETECTED Final   Candida albicans NOT DETECTED NOT DETECTED Final   Candida auris NOT DETECTED NOT DETECTED Final   Candida glabrata NOT DETECTED NOT DETECTED Final   Candida krusei NOT DETECTED NOT DETECTED Final   Candida parapsilosis NOT DETECTED NOT DETECTED Final   Candida tropicalis NOT DETECTED NOT DETECTED Final   Cryptococcus neoformans/gattii NOT DETECTED NOT DETECTED Final   Methicillin resistance mecA/C DETECTED (A) NOT DETECTED Final    Comment: CRITICAL RESULT CALLED TO, READ BACK BY AND VERIFIED WITH: PHARMD E JACKSON 10/24/2023 @ 2345 BY AB Performed at [facility], [address] [place], [place] [address]   Blood Culture (routine x 2)     Status: None (Preliminary result)   Collection Time: 10/24/23 12:41 AM   Specimen: BLOOD  Result Value Ref Range Status   Specimen Description   Final    BLOOD BLOOD RIGHT ARM Performed at [facility], [address] [place], [place] [address]    Special Requests   Final    BOTTLES DRAWN AEROBIC AND ANAEROBIC  Blood Culture results may not be optimal due to an inadequate volume of blood received in culture bottles Performed at [facility], [address] [place], [place] [address]     Culture   Final    NO GROWTH 3 DAYS Performed at [facility], [address], [place], [place] [address]    Report Status PENDING  Incomplete  MRSA Next Gen by PCR, Nasal     Status: None   Collection Time: 10/24/23  1:02 PM   Specimen: Nasal Mucosa; Nasal Swab  Result Value Ref Range Status   MRSA by PCR Next Gen NOT DETECTED NOT DETECTED Final    Comment: (NOTE) The GeneXpert MRSA Assay (FDA approved for NASAL specimens only), is one component of a comprehensive MRSA colonization surveillance program. It is not intended to diagnose MRSA infection nor to guide or monitor treatment for MRSA infections. Test performance is not FDA approved in patients less than 73 years old. Performed at [facility], [address], [place], [place] [address]   Aerobic/Anaerobic Culture w Gram Stain (surgical/deep wound)     Status: None   Collection Time: 10/24/23  4:22 PM   Specimen: Abscess  Result Value Ref Range Status   Specimen Description   Final    ABSCESS DRAIN Performed at [facility], [address], [place], [place] [address]    Special Requests   Final    NONE Performed at [facility], [address], [place], [place] [address]    Gram Stain   Final    FEW WBC PRESENT, PREDOMINANTLY PMN ABUNDANT GRAM POSITIVE COCCI MODERATE GRAM NEGATIVE RODS MODERATE GRAM POSITIVE RODS    Culture   Final    ABUNDANT ESCHERICHIA COLI ABUNDANT KLEBSIELLA OXYTOCA ABUNDANT ENTEROCOCCUS FAECIUM MODERATE PSEUDOMONAS AERUGINOSA SUSCEPTIBILITIES PERFORMED ON PREVIOUS CULTURE WITHIN THE LAST 5 DAYS. ABUNDANT PARABACTEROIDES DISTASONIS BETA LACTAMASE POSITIVE Performed at [facility], [address] [place], [place] [address]    Report Status 10/27/2023 FINAL  Final   Organism ID, Bacteria ENTEROCOCCUS FAECIUM  Final   Organism ID, Bacteria PSEUDOMONAS AERUGINOSA  Final      Susceptibility   Enterococcus faecium - MIC*     AMPICILLIN >=32 RESISTANT Resistant     VANCOMYCIN <=0.5 SENSITIVE Sensitive     GENTAMICIN SYNERGY SENSITIVE Sensitive     * ABUNDANT ENTEROCOCCUS FAECIUM   Pseudomonas aeruginosa - MIC*    CEFTAZIDIME <=1 SENSITIVE Sensitive     CIPROFLOXACIN <=0.25 SENSITIVE Sensitive     GENTAMICIN <=1 SENSITIVE Sensitive     IMIPENEM 0.5 SENSITIVE Sensitive     PIP/TAZO <=4 SENSITIVE Sensitive ug/mL    CEFEPIME 0.25 SENSITIVE Sensitive     * MODERATE PSEUDOMONAS AERUGINOSA  Aerobic/Anaerobic Culture w Gram Stain (surgical/deep wound)     Status: None (Preliminary result)   Collection Time: 10/24/23  9:44 PM   Specimen: Abscess; Wound  Result Value Ref Range Status   Specimen Description   Final    ABSCESS Performed at [facility], [address] [place], [place] [address]    Special Requests   Final    ABSCESS Performed at [facility], [address], [place], [place] [address]    Gram Stain   Final    FEW WBC PRESENT, PREDOMINANTLY PMN MODERATE GRAM NEGATIVE RODS MODERATE GRAM POSITIVE COCCI    Culture   Final    MODERATE KLEBSIELLA OXYTOCA FEW PSEUDOMONAS AERUGINOSA FEW  ENTEROCOCCUS FAECIUM SUSCEPTIBILITIES PERFORMED ON PREVIOUS CULTURE WITHIN THE LAST 5 DAYS. HOLDING FOR POSSIBLE ANAEROBE Performed at [facility], [address], [place], [place] [address]    Report Status PENDING  Incomplete   Organism ID, Bacteria KLEBSIELLA OXYTOCA  Final      Susceptibility   Klebsiella oxytoca - MIC*    AMPICILLIN RESISTANT Resistant     CEFEPIME <=0.12 SENSITIVE Sensitive     CEFTAZIDIME <=1 SENSITIVE Sensitive     CEFTRIAXONE <=0.25 SENSITIVE Sensitive     CIPROFLOXACIN <=0.25 SENSITIVE Sensitive     GENTAMICIN <=1 SENSITIVE Sensitive     IMIPENEM <=0.25 SENSITIVE Sensitive     TRIMETH/SULFA <=20 SENSITIVE Sensitive     AMPICILLIN/SULBACTAM <=2 SENSITIVE Sensitive     PIP/TAZO <=4 SENSITIVE Sensitive ug/mL    * MODERATE KLEBSIELLA  OXYTOCA  Aerobic/Anaerobic Culture w Gram Stain (surgical/deep wound)     Status: None (Preliminary result)   Collection Time: 10/24/23  9:47 PM   Specimen: Path Tissue  Result Value Ref Range Status   Specimen Description   Final    TISSUE Performed at [facility], [address], [place], [place] [address]    Special Requests   Final    TISSUE Performed at [facility], [address], [place], [place] [address]    Gram Stain   Final    RARE WBC PRESENT, PREDOMINANTLY PMN FEW GRAM POSITIVE COCCI RARE GRAM NEGATIVE RODS    Culture   Final    MODERATE ESCHERICHIA COLI MODERATE KLEBSIELLA OXYTOCA CULTURE REINCUBATED FOR BETTER GROWTH HOLDING FOR POSSIBLE ANAEROBE Performed at [facility], [address] [place], [place] [address]    Report Status PENDING  Incomplete   Organism ID, Bacteria ESCHERICHIA COLI  Final   Organism ID, Bacteria KLEBSIELLA OXYTOCA  Final      Susceptibility   Escherichia coli - MIC*    AMPICILLIN >=32 RESISTANT Resistant     CEFEPIME <=0.12 SENSITIVE Sensitive     CEFTAZIDIME <=1 SENSITIVE Sensitive     CEFTRIAXONE <=0.25 SENSITIVE Sensitive     CIPROFLOXACIN <=0.25 SENSITIVE Sensitive     GENTAMICIN <=1 SENSITIVE Sensitive     IMIPENEM <=0.25 SENSITIVE Sensitive     TRIMETH/SULFA <=20 SENSITIVE Sensitive     AMPICILLIN/SULBACTAM 8 SENSITIVE Sensitive     PIP/TAZO <=4 SENSITIVE Sensitive ug/mL    * MODERATE ESCHERICHIA COLI   Klebsiella oxytoca - MIC*    AMPICILLIN RESISTANT Resistant     CEFEPIME <=0.12 SENSITIVE Sensitive     CEFTAZIDIME <=1 SENSITIVE Sensitive     CEFTRIAXONE <=0.25 SENSITIVE Sensitive     CIPROFLOXACIN <=0.25 SENSITIVE Sensitive     GENTAMICIN <=1 SENSITIVE Sensitive     IMIPENEM <=0.25 SENSITIVE Sensitive     TRIMETH/SULFA <=20 SENSITIVE Sensitive     AMPICILLIN/SULBACTAM <=2 SENSITIVE Sensitive     PIP/TAZO <=4 SENSITIVE Sensitive ug/mL    * MODERATE KLEBSIELLA OXYTOCA     Studies/Results: No results found.     Assessment/Plan:  INTERVAL HISTORY:  Patient ultimately transferred to [facility] for surgery with Dr. Lajoyce Corners on Friday  Principal Problem:   Diverticulitis of large intestine with abscess Active Problems:   Infective myositis of left thigh   AKI (acute kidney injury) (HCC)   PAD (peripheral artery disease) (HCC)   Hyperglycemia   Protein-calorie malnutrition, severe   Necrotizing soft tissue infection    Ashley Galloway is a 73 y.o. female with vascular disease  prior diverticulitis with hepatic abscess s/p IR guided drain who had been treated for Streptococcus intermedius hepatic abscess and bacteremia in December 2024.  Ashley Galloway had finished ceftriaxone and metronidazole through October 14, 2023 and then on cefadroxil for an additional 2 weeks.  Ashley Galloway developed nausea and vomiting.  In the interim Ashley Galloway had also developed left leg pain.  Ashley Galloway was admitted to the hospital with septic shock.  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 A new 6.9 cm collection of likely fecal material and air seen in the LLQ abutting the sigmoid colon concerning for perforation with abscess, liver still with ill defined lesion though smaller than before. Ashley Galloway had fluid and air on CT that was tracking into the abductor muscle concerning for myositis.  CT of the lower extremity showed extensive air tracking along the fascial planes left hip groin thigh and posterior calf musculature concerning for necrotizing fasciitis.  Went incision and debridement of left thigh abscess and medial thigh in the evening of the 25th.  Apparently there was dishwater foul-smelling fluid and devitalized fascial tissue seen and cultures were sent.  Ashley Galloway has also  had IR guided drain placed into feculent material from LLQ  Her blood cultures are growing MRSE and from 1 of 2 sites that have been isolated her antimicrobials have been broadened from vancomycin Zosyn and clindamycin to Zosyn Zyvox and micafungin by my partner Dr. Drue Second  Cultures are now showing Klebsiella oxytoca ane E coli from one  abscess culture on the 25th as well as Klebsiella oxytoca Enterococcus faecium(AMP R) and moderate Pseudomonas aeruginosa from another abscess from same date and and another culture with these 3 orgnansims and E coli    #1  Diverticular abscess with extension of infection of the left thigh and leg causing necrotizing fasciitis status post I&D   Diverticular abscess with extension of infection into left thigh and leg with passing fasciitis status post I&D  Continue Zyvox and Zosyn but the micafungin can be stopped  Pt still on pressors levophed  #2  Rising soft tissue infection involving thigh:  Patient to go to [facility] for surgery on Friday.  When this happens we will change her over to the [facility] infectious ease rounding team so that we can follow her culture data and organize her antibiotics there   I have personally spent 50  minutes involved in face-to-face and non-face-to-face activities for this patient on the day of the visit. Professional time spent includes the following activities: Preparing to see the patient (review of tests), Obtaining and/or reviewing separately obtained history (admission/discharge record), Performing a medically appropriate examination and/or evaluation , Ordering medications/tests/procedures, referring and communicating with other health care professionals, Documenting clinical information in the EMR, Independently interpreting results (not separately reported), Communicating results to the patient/family/caregiver, Counseling and educating the patient/family/caregiver and Care coordination (not separately reported).    Evaluation of the patient requires complex antimicrobial therapy evaluation, counseling , isolation needs to reduce disease transmission and risk assessment and mitigation.     LOS: 3 days   Acey Lav 10/27/2023, 4:31 PM

## 2023-10-27 NOTE — Progress Notes (Signed)
Subjective:  Patient reports pain as mild to moderate.  Denies N/V/CP/SOB.  She reports pain controlled with medication.  She is fatigued this morning.  She was restarted on pressors this morning.   Objective:   VITALS:   Vitals:   10/27/23 0600 10/27/23 0615 10/27/23 0630 10/27/23 0645  BP: 120/61 (!) 136/49 (!) 143/45 (!) 136/46  Pulse: 76 74 74 71  Resp: (!) 22 20 14 14   Temp: 99.7 F (37.6 C) 99.5 F (37.5 C) 99.3 F (37.4 C) 99.3 F (37.4 C)  TempSrc:      SpO2: 98% 100% 99% 98%  Weight:      Height:        NAD. Lying in bed. Fatigued.  Neurovascular intact Sensation intact distally Intact pulses distally Dorsiflexion/Plantar flexion intact Wound vac 125 mmHg no leaks detected. 175 cc SS output.  Ace bandage from foot to thigh.    Lab Results  Component Value Date   WBC 12.3 (H) 10/27/2023   HGB 7.8 (L) 10/27/2023   HCT 23.1 (L) 10/27/2023   MCV 85.2 10/27/2023   PLT 221 10/27/2023   BMET    Component Value Date/Time   NA 133 (L) 10/27/2023 0559   K 3.0 (L) 10/27/2023 0559   CL 99 10/27/2023 0559   CO2 24 10/27/2023 0559   GLUCOSE 106 (H) 10/27/2023 0559   BUN 12 10/27/2023 0559   CREATININE 0.51 10/27/2023 0559   CALCIUM 7.6 (L) 10/27/2023 0559   GFRNONAA >60 10/27/2023 0559   Results for orders placed or performed during the hospital encounter of 10/23/23  Resp panel by RT-PCR (RSV, Flu A&B, Covid) Anterior Nasal Swab     Status: None   Collection Time: 10/23/23 11:37 PM   Specimen: Anterior Nasal Swab  Result Value Ref Range Status   SARS Coronavirus 2 by RT PCR NEGATIVE NEGATIVE Final    Comment: (NOTE) SARS-CoV-2 target nucleic acids are NOT DETECTED.  The SARS-CoV-2 RNA is generally detectable in upper respiratory specimens during the acute phase of infection. The lowest concentration of SARS-CoV-2 viral copies this assay can detect is 138 copies/mL. A negative result does not preclude SARS-Cov-2 infection and should not be used as  the sole basis for treatment or other patient management decisions. A negative result may occur with  improper specimen collection/handling, submission of specimen other than nasopharyngeal swab, presence of viral mutation(s) within the areas targeted by this assay, and inadequate number of viral copies(<138 copies/mL). A negative result must be combined with clinical observations, patient history, and epidemiological information. The expected result is Negative.  Fact Sheet for Patients:  BloggerCourse.com  Fact Sheet for Healthcare Providers:  SeriousBroker.it  This test is no t yet approved or cleared by the [place] FDA and  has been authorized for detection and/or diagnosis of SARS-CoV-2 by FDA under an Emergency Use Authorization (EUA). This EUA will remain  in effect (meaning this test can be used) for the duration of the COVID-19 declaration under Section 564(b)(1) of the Act, 21 U.S.C.section 360bbb-3(b)(1), unless the authorization is terminated  or revoked sooner.       Influenza A by PCR NEGATIVE NEGATIVE Final   Influenza B by PCR NEGATIVE NEGATIVE Final    Comment: (NOTE) The Xpert Xpress SARS-CoV-2/FLU/RSV plus assay is intended as an aid in the diagnosis of influenza from Nasopharyngeal swab specimens and should not be used as a sole basis for treatment. Nasal washings and aspirates are unacceptable for Xpert Xpress SARS-CoV-2/FLU/RSV testing.  Fact Sheet for Patients: BloggerCourse.com  Fact Sheet for Healthcare Providers: SeriousBroker.it  This test is not yet approved or cleared by the [place] FDA and has been authorized for detection and/or diagnosis of SARS-CoV-2 by FDA under an Emergency Use Authorization (EUA). This EUA will remain in effect (meaning this test can be used) for the duration of the COVID-19 declaration under Section 564(b)(1) of  the Act, 21 U.S.C. section 360bbb-3(b)(1), unless the authorization is terminated or revoked.     Resp Syncytial Virus by PCR NEGATIVE NEGATIVE Final    Comment: (NOTE) Fact Sheet for Patients: BloggerCourse.com  Fact Sheet for Healthcare Providers: SeriousBroker.it  This test is not yet approved or cleared by the [place] FDA and has been authorized for detection and/or diagnosis of SARS-CoV-2 by FDA under an Emergency Use Authorization (EUA). This EUA will remain in effect (meaning this test can be used) for the duration of the COVID-19 declaration under Section 564(b)(1) of the Act, 21 U.S.C. section 360bbb-3(b)(1), unless the authorization is terminated or revoked.  Performed at [facility], [address], [place], [place] [address]   Blood Culture (routine x 2)     Status: Abnormal   Collection Time: 10/23/23 11:37 PM   Specimen: BLOOD  Result Value Ref Range Status   Specimen Description   Final    BLOOD LEFT ANTECUBITAL Performed at [facility], [address] [place], [place] [address]    Special Requests   Final    BOTTLES DRAWN AEROBIC AND ANAEROBIC Blood Culture results may not be optimal due to an inadequate volume of blood received in culture bottles Performed at [facility], [address], [place], [place] [address]    Culture  Setup Time   Final    GRAM POSITIVE COCCI IN CLUSTERS AEROBIC BOTTLE ONLY CRITICAL RESULT CALLED TO, READ BACK BY AND VERIFIED WITH: PHARMD E JACKSON 10/24/2023 @ 2345 BY AB    Culture (A)  Final    STAPHYLOCOCCUS EPIDERMIDIS THE SIGNIFICANCE OF ISOLATING THIS ORGANISM FROM A SINGLE SET OF BLOOD CULTURES WHEN MULTIPLE SETS ARE DRAWN IS UNCERTAIN. PLEASE NOTIFY THE MICROBIOLOGY DEPARTMENT WITHIN ONE WEEK IF SPECIATION AND SENSITIVITIES ARE REQUIRED. Performed at [facility], [address] [place], [place]  [address]    Report Status 10/25/2023 FINAL  Final  Blood Culture ID Panel (Reflexed)     Status: Abnormal   Collection Time: 10/23/23 11:37 PM  Result Value Ref Range Status   Enterococcus faecalis NOT DETECTED NOT DETECTED Final   Enterococcus Faecium NOT DETECTED NOT DETECTED Final   Listeria monocytogenes NOT DETECTED NOT DETECTED Final   Staphylococcus species DETECTED (A) NOT DETECTED Final    Comment: CRITICAL RESULT CALLED TO, READ BACK BY AND VERIFIED WITH: PHARMD E JACKSON 10/24/2023 @ 2345 BY AB    Staphylococcus aureus (BCID) NOT DETECTED NOT DETECTED Final   Staphylococcus epidermidis DETECTED (A) NOT DETECTED Final    Comment: Methicillin (oxacillin) resistant coagulase negative staphylococcus. Possible blood culture contaminant (unless isolated from more than one blood culture draw or clinical case suggests pathogenicity). No antibiotic treatment is indicated for blood  culture contaminants. CRITICAL RESULT CALLED TO, READ BACK BY AND VERIFIED WITH: PHARMD E JACKSON 10/24/2023 @ 2345 BY AB    Staphylococcus lugdunensis NOT DETECTED NOT DETECTED Final   Streptococcus species NOT DETECTED NOT DETECTED Final   Streptococcus agalactiae NOT DETECTED NOT DETECTED Final   Streptococcus pneumoniae NOT DETECTED NOT DETECTED Final  Streptococcus pyogenes NOT DETECTED NOT DETECTED Final   A.calcoaceticus-baumannii NOT DETECTED NOT DETECTED Final   Bacteroides fragilis NOT DETECTED NOT DETECTED Final   Enterobacterales NOT DETECTED NOT DETECTED Final   Enterobacter cloacae complex NOT DETECTED NOT DETECTED Final   Escherichia coli NOT DETECTED NOT DETECTED Final   Klebsiella aerogenes NOT DETECTED NOT DETECTED Final   Klebsiella oxytoca NOT DETECTED NOT DETECTED Final   Klebsiella pneumoniae NOT DETECTED NOT DETECTED Final   Proteus species NOT DETECTED NOT DETECTED Final   Salmonella species NOT DETECTED NOT DETECTED Final   Serratia marcescens NOT DETECTED NOT DETECTED Final    Haemophilus influenzae NOT DETECTED NOT DETECTED Final   Neisseria meningitidis NOT DETECTED NOT DETECTED Final   Pseudomonas aeruginosa NOT DETECTED NOT DETECTED Final   Stenotrophomonas maltophilia NOT DETECTED NOT DETECTED Final   Candida albicans NOT DETECTED NOT DETECTED Final   Candida auris NOT DETECTED NOT DETECTED Final   Candida glabrata NOT DETECTED NOT DETECTED Final   Candida krusei NOT DETECTED NOT DETECTED Final   Candida parapsilosis NOT DETECTED NOT DETECTED Final   Candida tropicalis NOT DETECTED NOT DETECTED Final   Cryptococcus neoformans/gattii NOT DETECTED NOT DETECTED Final   Methicillin resistance mecA/C DETECTED (A) NOT DETECTED Final    Comment: CRITICAL RESULT CALLED TO, READ BACK BY AND VERIFIED WITH: PHARMD E JACKSON 10/24/2023 @ 2345 BY AB Performed at [facility], [address] [place], [place] [address]   Blood Culture (routine x 2)     Status: None (Preliminary result)   Collection Time: 10/24/23 12:41 AM   Specimen: BLOOD  Result Value Ref Range Status   Specimen Description   Final    BLOOD BLOOD RIGHT ARM Performed at [facility], [address] [place], [place] [address]    Special Requests   Final    BOTTLES DRAWN AEROBIC AND ANAEROBIC Blood Culture results may not be optimal due to an inadequate volume of blood received in culture bottles Performed at [facility], [address] [place], [place] [address]    Culture   Final    NO GROWTH 2 DAYS Performed at [facility], [address] [place], [place] [address]    Report Status PENDING  Incomplete  MRSA Next Gen by PCR, Nasal     Status: None   Collection Time: 10/24/23  1:02 PM   Specimen: Nasal Mucosa; Nasal Swab  Result Value Ref Range Status   MRSA by PCR Next Gen NOT DETECTED NOT DETECTED Final    Comment: (NOTE) The GeneXpert MRSA Assay (FDA approved for NASAL specimens only), is one component of a comprehensive MRSA  colonization surveillance program. It is not intended to diagnose MRSA infection nor to guide or monitor treatment for MRSA infections. Test performance is not FDA approved in patients less than 50 years old. Performed at [facility], [address], [place], [place] [address]   Aerobic/Anaerobic Culture w Gram Stain (surgical/deep wound)     Status: None (Preliminary result)   Collection Time: 10/24/23  4:22 PM   Specimen: Abscess  Result Value Ref Range Status   Specimen Description   Final    ABSCESS DRAIN Performed at [facility], [address], [place], [place] [address]    Special Requests   Final    NONE Performed at [facility], [address], [place], [place] [address]    Gram Stain   Final  FEW WBC PRESENT, PREDOMINANTLY PMN ABUNDANT GRAM POSITIVE COCCI MODERATE GRAM NEGATIVE RODS MODERATE GRAM POSITIVE RODS    Culture   Final    ABUNDANT ESCHERICHIA COLI ABUNDANT KLEBSIELLA OXYTOCA ABUNDANT ENTEROCOCCUS FAECIUM MODERATE PSEUDOMONAS AERUGINOSA SUSCEPTIBILITIES PERFORMED ON PREVIOUS CULTURE WITHIN THE LAST 5 DAYS. HOLDING FOR POSSIBLE ANAEROBE Performed at [facility], [address] [place], [place] [address]    Report Status PENDING  Incomplete   Organism ID, Bacteria ENTEROCOCCUS FAECIUM  Final   Organism ID, Bacteria PSEUDOMONAS AERUGINOSA  Final      Susceptibility   Enterococcus faecium - MIC*    AMPICILLIN >=32 RESISTANT Resistant     VANCOMYCIN <=0.5 SENSITIVE Sensitive     GENTAMICIN SYNERGY SENSITIVE Sensitive     * ABUNDANT ENTEROCOCCUS FAECIUM   Pseudomonas aeruginosa - MIC*    CEFTAZIDIME <=1 SENSITIVE Sensitive     CIPROFLOXACIN <=0.25 SENSITIVE Sensitive     GENTAMICIN <=1 SENSITIVE Sensitive     IMIPENEM 0.5 SENSITIVE Sensitive     PIP/TAZO <=4 SENSITIVE Sensitive ug/mL    CEFEPIME 0.25 SENSITIVE Sensitive     * MODERATE PSEUDOMONAS AERUGINOSA  Aerobic/Anaerobic Culture  w Gram Stain (surgical/deep wound)     Status: None (Preliminary result)   Collection Time: 10/24/23  9:44 PM   Specimen: Abscess; Wound  Result Value Ref Range Status   Specimen Description ABSCESS  Final   Special Requests ABSCESS  Final   Gram Stain   Final    FEW WBC PRESENT, PREDOMINANTLY PMN MODERATE GRAM NEGATIVE RODS MODERATE GRAM POSITIVE COCCI    Culture   Final    MODERATE KLEBSIELLA OXYTOCA CULTURE REINCUBATED FOR BETTER GROWTH SUSCEPTIBILITIES TO FOLLOW    Report Status PENDING  Incomplete   Organism ID, Bacteria KLEBSIELLA OXYTOCA  Final      Susceptibility   Klebsiella oxytoca - MIC*    AMPICILLIN RESISTANT Resistant     CEFEPIME <=0.12 SENSITIVE Sensitive     CEFTAZIDIME <=1 SENSITIVE Sensitive     CEFTRIAXONE <=0.25 SENSITIVE Sensitive     CIPROFLOXACIN <=0.25 SENSITIVE Sensitive     GENTAMICIN <=1 SENSITIVE Sensitive     IMIPENEM <=0.25 SENSITIVE Sensitive     TRIMETH/SULFA <=20 SENSITIVE Sensitive     AMPICILLIN/SULBACTAM <=2 SENSITIVE Sensitive     PIP/TAZO Value in next row Sensitive ug/mL     <=4 SENSITIVEPerformed at [facility], [address] [place], [place] [address]    * MODERATE KLEBSIELLA OXYTOCA  Aerobic/Anaerobic Culture w Gram Stain (surgical/deep wound)     Status: None (Preliminary result)   Collection Time: 10/24/23  9:47 PM   Specimen: Path Tissue  Result Value Ref Range Status   Specimen Description   Final    TISSUE Performed at [facility], [address] [place], [place] [address]    Special Requests   Final    TISSUE Performed at [facility], [address], [place], [place] [address]    Gram Stain   Final    RARE WBC PRESENT, PREDOMINANTLY PMN FEW GRAM POSITIVE COCCI RARE GRAM NEGATIVE RODS    Culture   Final    MODERATE ESCHERICHIA COLI MODERATE KLEBSIELLA OXYTOCA CULTURE REINCUBATED FOR BETTER GROWTH Performed at [facility], [address], [place], [place]  [address]    Report Status PENDING  Incomplete   Organism ID, Bacteria ESCHERICHIA COLI  Final   Organism ID, Bacteria KLEBSIELLA OXYTOCA  Final      Susceptibility  Escherichia coli - MIC*    AMPICILLIN >=32 RESISTANT Resistant     CEFEPIME <=0.12 SENSITIVE Sensitive     CEFTAZIDIME <=1 SENSITIVE Sensitive     CEFTRIAXONE <=0.25 SENSITIVE Sensitive     CIPROFLOXACIN <=0.25 SENSITIVE Sensitive     GENTAMICIN <=1 SENSITIVE Sensitive     IMIPENEM <=0.25 SENSITIVE Sensitive     TRIMETH/SULFA <=20 SENSITIVE Sensitive     AMPICILLIN/SULBACTAM 8 SENSITIVE Sensitive     PIP/TAZO <=4 SENSITIVE Sensitive ug/mL    * MODERATE ESCHERICHIA COLI   Klebsiella oxytoca - MIC*    AMPICILLIN RESISTANT Resistant     CEFEPIME <=0.12 SENSITIVE Sensitive     CEFTAZIDIME <=1 SENSITIVE Sensitive     CEFTRIAXONE <=0.25 SENSITIVE Sensitive     CIPROFLOXACIN <=0.25 SENSITIVE Sensitive     GENTAMICIN <=1 SENSITIVE Sensitive     IMIPENEM <=0.25 SENSITIVE Sensitive     TRIMETH/SULFA <=20 SENSITIVE Sensitive     AMPICILLIN/SULBACTAM <=2 SENSITIVE Sensitive     PIP/TAZO <=4 SENSITIVE Sensitive ug/mL    * MODERATE KLEBSIELLA OXYTOCA     Assessment/Plan: 3 Days Post-Op   Principal Problem:   Diverticulitis of large intestine with abscess Active Problems:   Infective myositis of left thigh   AKI (acute kidney injury) (HCC)   PAD (peripheral artery disease) (HCC)   Hyperglycemia   Protein-calorie malnutrition, severe   Necrotizing soft tissue infection   Continue IV antibiotics and wound VAC No current need for further surgical intervention today. Continue wound vac. Plan to transfer to [facility] for Dr. Lajoyce Corners to resume care of LLE. Transfer planned for tomorrow vs Thursday.    Clois Dupes 10/27/2023, 9:21 AM   [facility]  [place] [address], [address], [place], [place] [address] Phone: [phone] [email or web address] Facebook  [employer]

## 2023-10-27 NOTE — Plan of Care (Signed)
  Problem: Fluid Volume: Goal: Ability to maintain a balanced intake and output will improve Outcome: Progressing   Problem: Metabolic: Goal: Ability to maintain appropriate glucose levels will improve Outcome: Progressing   Problem: Nutritional: Goal: Maintenance of adequate nutrition will improve Outcome: Progressing Note: Slowly starting to eat full liquid diet and Breezes   Problem: Fluid Volume: Goal: Hemodynamic stability will improve Outcome: Progressing Note: Off levo and started on oral midodrine.   Problem: Clinical Measurements: Goal: Diagnostic test results will improve Outcome: Not Progressing Note: 6 runs of K+.   Problem: Clinical Measurements: Goal: Respiratory complications will improve Outcome: Progressing

## 2023-10-27 NOTE — Progress Notes (Signed)
3 Days Post-Op   Subjective/Chief Complaint: Fever curve improving and down to Tmax 100.  Denies abdominal pain.  Leg pain seems well controlled.  Tolerating CLD  Objective: Vital signs in last 24 hours: Temp:  [98.3 F (36.8 C)-100 F (37.8 C)] 99.3 F (37.4 C) (01/28 0645) Pulse Rate:  [71-99] 71 (01/28 0645) Resp:  [14-26] 14 (01/28 0645) BP: (100-143)/(31-61) 136/46 (01/28 0645) SpO2:  [97 %-100 %] 98 % (01/28 0645) Last BM Date :  (PTA)  Intake/Output from previous day: 01/27 0701 - 01/28 0700 In: 3012.3 [I.V.:1771; Blood:334.4; IV Piggyback:901.9] Out: 1425 [Urine:950; Drains:475] Intake/Output this shift: Total I/O In: 210.5 [I.V.:148.8; IV Piggyback:61.7] Out: -   PE: General: pleasant, WD, female who is laying in bed in NAD Lungs: Respiratory effort nonlabored Abd: soft, NT, ND, drain LLQ with brown, feculent output - 125 ml/24h MSK: LLE with upper thigh wrapped in ace   Lab Results:  Recent Labs    10/26/23 0311 10/27/23 0559  WBC 10.1 12.3*  HGB 7.0* 7.8*  HCT 20.9* 23.1*  PLT 245 221   BMET Recent Labs    10/26/23 0311 10/27/23 0559  NA 133* 133*  K 3.1* 3.0*  CL 102 99  CO2 23 24  GLUCOSE 138* 106*  BUN 11 12  CREATININE <0.30* 0.51  CALCIUM 7.8* 7.6*   PT/INR No results for input(s): "LABPROT", "INR" in the last 72 hours.  ABG Recent Labs    10/24/23 2133 10/24/23 2225  PHART 7.435 7.422  HCO3 21.6 21.5    Studies/Results: No results found.   Anti-infectives: Anti-infectives (From admission, onward)    Start     Dose/Rate Route Frequency Ordered Stop   10/26/23 0400  vancomycin (VANCOREADY) IVPB 1500 mg/300 mL  Status:  Discontinued        1,500 mg 150 mL/hr over 120 Minutes Intravenous Every 24 hours 10/25/23 0725 10/26/23 0754   10/26/23 0002  ceFAZolin (ANCEF) IVPB 2g/100 mL premix        2 g 200 mL/hr over 30 Minutes Intravenous Every 6 hours 10/26/23 0002 10/26/23 0650   10/25/23 2200  linezolid (ZYVOX) IVPB 600 mg         600 mg 300 mL/hr over 60 Minutes Intravenous Every 12 hours 10/25/23 1634     10/25/23 1800  micafungin (MYCAMINE) 100 mg in sodium chloride 0.9 % 100 mL IVPB        100 mg 105 mL/hr over 1 Hours Intravenous Every 24 hours 10/25/23 1645     10/25/23 0215  vancomycin (VANCOREADY) IVPB 1500 mg/300 mL        1,500 mg 150 mL/hr over 120 Minutes Intravenous  Once 10/25/23 0121 10/25/23 0358   10/24/23 1000  linezolid (ZYVOX) IVPB 600 mg  Status:  Discontinued        600 mg 300 mL/hr over 60 Minutes Intravenous Every 12 hours 10/24/23 0224 10/24/23 2026   10/24/23 0800  piperacillin-tazobactam (ZOSYN) IVPB 3.375 g        3.375 g 12.5 mL/hr over 240 Minutes Intravenous Every 8 hours 10/24/23 0231     10/24/23 0600  clindamycin (CLEOCIN) IVPB 600 mg  Status:  Discontinued        600 mg 100 mL/hr over 30 Minutes Intravenous Every 8 hours 10/24/23 0224 10/25/23 1634   10/24/23 0230  piperacillin-tazobactam (ZOSYN) IVPB 3.375 g  Status:  Discontinued       Placed in "And" Linked Group   3.375 g 100 mL/hr over 30  Minutes Intravenous  Once 10/24/23 0224 10/24/23 0229   10/23/23 2345  ceFEPIme (MAXIPIME) 2 g in sodium chloride 0.9 % 100 mL IVPB        2 g 200 mL/hr over 30 Minutes Intravenous  Once 10/23/23 2337 10/24/23 0035   10/23/23 2345  metroNIDAZOLE (FLAGYL) IVPB 500 mg        500 mg 100 mL/hr over 60 Minutes Intravenous  Once 10/23/23 2337 10/24/23 0127   10/23/23 2345  vancomycin (VANCOCIN) IVPB 1000 mg/200 mL premix        1,000 mg 200 mL/hr over 60 Minutes Intravenous  Once 10/23/23 2337 10/24/23 0313       Assessment/Plan: 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 Linna Caprice 1/25  S/p IR drain of LLQ abd abscess 1/25  - fever curve downtrending, WBC up to 12K today, continue to follow and monitor - Drain functioning - abdominal exam stable, soft with no evidence of peritonitis. - cultures from  debridement with E. Coli, Klebsiella oxytoca - IR drain cx with Klebsiella oxytoca, resistance to amp, otherwise sensitive. - Monitor drain and continue IV abx per ID (changed to piptzo/linezolid/micafungin 1/26) -adv to FLD -controlled with drain for now, but need to consider next steps for patient as her drain appears most consistent with a fistula and clearly her diverticulitis is the etiology of current situation. - plans to tx to [facility] on Friday for Dr. Lajoyce Corners to take over her leg.  FEN: FLD ID: zosyn, micafungan, zyvox VTE: hep subq   LOS: 3 days   I reviewed Consultant IR, ortho, ID notes, hospitalist notes, last 24 h vitals and pain scores, last 48 h intake and output, last 24 h labs and trends, and last 24 h imaging results.   Letha Cape, [facility] 10/27/2023, 9:14 AM Please see Amion for pager number during day hours 7:00am-4:30pm

## 2023-10-28 ENCOUNTER — Inpatient Hospital Stay (HOSPITAL_COMMUNITY): Payer: Medicare HMO

## 2023-10-28 DIAGNOSIS — G9341 Metabolic encephalopathy: Secondary | ICD-10-CM | POA: Diagnosis not present

## 2023-10-28 DIAGNOSIS — A499 Bacterial infection, unspecified: Secondary | ICD-10-CM | POA: Diagnosis not present

## 2023-10-28 DIAGNOSIS — M60052 Infective myositis, left thigh: Secondary | ICD-10-CM | POA: Diagnosis not present

## 2023-10-28 DIAGNOSIS — I739 Peripheral vascular disease, unspecified: Secondary | ICD-10-CM | POA: Diagnosis not present

## 2023-10-28 DIAGNOSIS — K651 Peritoneal abscess: Secondary | ICD-10-CM | POA: Diagnosis not present

## 2023-10-28 DIAGNOSIS — K5720 Diverticulitis of large intestine with perforation and abscess without bleeding: Secondary | ICD-10-CM | POA: Diagnosis not present

## 2023-10-28 DIAGNOSIS — A419 Sepsis, unspecified organism: Secondary | ICD-10-CM | POA: Diagnosis not present

## 2023-10-28 DIAGNOSIS — M7989 Other specified soft tissue disorders: Secondary | ICD-10-CM | POA: Diagnosis not present

## 2023-10-28 LAB — BPAM RBC
Blood Product Expiration Date: 202502172359
Blood Product Expiration Date: 202502182359
ISSUE DATE / TIME: 202501271147
Unit Type and Rh: 202502182359
Unit Type and Rh: 6200
Unit Type and Rh: 6200

## 2023-10-28 LAB — GLUCOSE, CAPILLARY
Glucose-Capillary: 83 mg/dL (ref 70–99)
Glucose-Capillary: 65 mg/dL — ABNORMAL LOW (ref 70–99)
Glucose-Capillary: 67 mg/dL — ABNORMAL LOW (ref 70–99)
Glucose-Capillary: 69 mg/dL — ABNORMAL LOW (ref 70–99)
Glucose-Capillary: 98 mg/dL (ref 70–99)
Glucose-Capillary: 100 mg/dL — ABNORMAL HIGH (ref 70–99)
Glucose-Capillary: 115 mg/dL — ABNORMAL HIGH (ref 70–99)
Glucose-Capillary: 107 mg/dL — ABNORMAL HIGH (ref 70–99)
Glucose-Capillary: 96 mg/dL (ref 70–99)
Glucose-Capillary: 160 mg/dL — ABNORMAL HIGH (ref 70–99)

## 2023-10-28 LAB — CBC
HCT: 22.8 % — ABNORMAL LOW (ref 36.0–46.0)
Hemoglobin: 7.3 g/dL — ABNORMAL LOW (ref 12.0–15.0)
MCH: 27.9 pg (ref 26.0–34.0)
MCHC: 32 g/dL (ref 30.0–36.0)
MCV: 87 fL (ref 80.0–100.0)
Platelets: 193 10*3/uL (ref 150–400)
RBC: 2.62 MIL/uL — ABNORMAL LOW (ref 3.87–5.11)
RDW: 14.8 % (ref 11.5–15.5)
WBC: 9.6 10*3/uL (ref 4.0–10.5)
nRBC: 0 % (ref 0.0–0.2)

## 2023-10-28 LAB — BASIC METABOLIC PANEL
Anion gap: 10 (ref 5–15)
BUN: 11 mg/dL (ref 8–23)
CO2: 25 mmol/L (ref 22–32)
Calcium: 8.3 mg/dL — ABNORMAL LOW (ref 8.9–10.3)
Chloride: 100 mmol/L (ref 98–111)
Creatinine, Ser: 0.31 mg/dL — ABNORMAL LOW (ref 0.44–1.00)
GFR, Estimated: >60 mL/min (ref >60)
Glucose, Bld: 104 mg/dL — ABNORMAL HIGH (ref 70–99)
Potassium: 3.8 mmol/L (ref 3.5–5.1)
Sodium: 135 mmol/L (ref 135–145)

## 2023-10-28 LAB — AEROBIC/ANAEROBIC CULTURE W GRAM STAIN (SURGICAL/DEEP WOUND)

## 2023-10-28 LAB — TYPE AND SCREEN
Unit division: 0
Unit division: 0

## 2023-10-28 LAB — PHOSPHORUS: Phosphorus: 1.4 mg/dL — ABNORMAL LOW (ref 2.5–4.6)

## 2023-10-28 LAB — MAGNESIUM: Magnesium: 1.8 mg/dL (ref 1.7–2.4)

## 2023-10-28 MED ORDER — CHLORHEXIDINE GLUCONATE CLOTH 2 % EX PADS: 6.0000 | MEDICATED_PAD | Freq: Every day | CUTANEOUS | Status: DC

## 2023-10-28 MED ORDER — HYDROMORPHONE HCL 1 MG/ML IJ SOLN
0.5000 mg | INTRAMUSCULAR | Status: DC | PRN
  Administered 2023-10-28 – 2023-11-02 (×6): 0.5 mg via INTRAVENOUS
  Filled 2023-10-28 (×2): qty 0.5
  Filled 2023-10-28: qty 1
  Filled 2023-10-28 (×3): qty 0.5

## 2023-10-28 MED ORDER — ADULT MULTIVITAMIN LIQUID CH
15.0000 mL | Freq: Every day | ORAL | Status: DC
  Administered 2023-10-29 – 2023-11-01 (×4): 15 mL
  Filled 2023-10-28 (×5): qty 15

## 2023-10-28 MED ORDER — DEXTROSE 50 % IV SOLN
INTRAVENOUS | Status: AC
  Filled 2023-10-28: qty 50

## 2023-10-28 MED ORDER — DOCUSATE SODIUM 50 MG/5ML PO LIQD
100.0000 mg | Freq: Two times a day (BID) | ORAL | Status: DC
  Administered 2023-10-28 – 2023-11-01 (×6): 100 mg
  Filled 2023-10-28 (×7): qty 10

## 2023-10-28 MED ORDER — DEXTROSE 50 % IV SOLN
12.5000 g | INTRAVENOUS | Status: AC
Start: 2023-10-28 — End: 2023-10-28
  Administered 2023-10-28: 12.5 g via INTRAVENOUS

## 2023-10-28 MED ORDER — ASCORBIC ACID 500 MG/5ML PO LIQD
500.0000 mg | Freq: Two times a day (BID) | ORAL | Status: DC
  Administered 2023-10-29 – 2023-11-01 (×8): 500 mg
  Filled 2023-10-28 (×9): qty 5

## 2023-10-28 MED ORDER — ENSURE ENLIVE PO LIQD: 237.0000 mL | Freq: Two times a day (BID) | ORAL | Status: DC

## 2023-10-28 MED ORDER — OSMOLITE 1.5 CAL PO LIQD
1000.0000 mL | ORAL | Status: DC
  Administered 2023-10-28: 1000 mL
  Filled 2023-10-28 (×3): qty 1000

## 2023-10-28 MED ORDER — ALBUMIN HUMAN 25 % IV SOLN
25.0000 g | Freq: Four times a day (QID) | INTRAVENOUS | Status: AC
  Administered 2023-10-28 (×3): 25 g via INTRAVENOUS
  Filled 2023-10-28 (×3): qty 100

## 2023-10-28 MED ORDER — MAGNESIUM SULFATE 2 GM/50ML IV SOLN
2.0000 g | Freq: Once | INTRAVENOUS | Status: AC
  Administered 2023-10-28: 2 g via INTRAVENOUS
  Filled 2023-10-28: qty 50

## 2023-10-28 MED ORDER — POTASSIUM CHLORIDE 10 MEQ/50ML IV SOLN
10.0000 meq | INTRAVENOUS | Status: AC
Start: 2023-10-28 — End: 2023-10-28
  Administered 2023-10-28 (×4): 10 meq via INTRAVENOUS
  Filled 2023-10-28 (×4): qty 50

## 2023-10-28 MED ORDER — ZINC SULFATE 220 (50 ZN) MG PO CAPS
220.0000 mg | ORAL_CAPSULE | Freq: Every day | ORAL | Status: DC
  Administered 2023-10-29 – 2023-11-01 (×4): 220 mg
  Filled 2023-10-28 (×5): qty 1

## 2023-10-28 MED ORDER — FUROSEMIDE 10 MG/ML IJ SOLN
20.0000 mg | Freq: Once | INTRAMUSCULAR | Status: AC
  Administered 2023-10-28: 20 mg via INTRAVENOUS
  Filled 2023-10-28: qty 2

## 2023-10-28 MED ORDER — POTASSIUM PHOSPHATES 15 MMOLE/5ML IV SOLN
30.0000 mmol | Freq: Once | INTRAVENOUS | Status: AC
  Administered 2023-10-28: 30 mmol via INTRAVENOUS
  Filled 2023-10-28: qty 10

## 2023-10-28 MED ORDER — BOOST PLUS PO LIQD
237.0000 mL | Freq: Three times a day (TID) | ORAL | Status: DC
  Administered 2023-10-28: 237 mL via ORAL
  Filled 2023-10-28 (×2): qty 237

## 2023-10-28 NOTE — Progress Notes (Signed)
Subjective: Patient fairly somnolent when I made rounds this morning   Antibiotics:  Anti-infectives (From admission, onward)    Start     Dose/Rate Route Frequency Ordered Stop   10/26/23 0400  vancomycin (VANCOREADY) IVPB 1500 mg/300 mL  Status:  Discontinued        1,500 mg 150 mL/hr over 120 Minutes Intravenous Every 24 hours 10/25/23 0725 10/26/23 0754   10/26/23 0002  ceFAZolin (ANCEF) IVPB 2g/100 mL premix        2 g 200 mL/hr over 30 Minutes Intravenous Every 6 hours 10/26/23 0002 10/26/23 0650   10/25/23 2200  linezolid (ZYVOX) IVPB 600 mg        600 mg 300 mL/hr over 60 Minutes Intravenous Every 12 hours 10/25/23 1634     10/25/23 1800  micafungin (MYCAMINE) 100 mg in sodium chloride 0.9 % 100 mL IVPB  Status:  Discontinued        100 mg 105 mL/hr over 1 Hours Intravenous Every 24 hours 10/25/23 1645 10/27/23 1332   10/25/23 0215  vancomycin (VANCOREADY) IVPB 1500 mg/300 mL        1,500 mg 150 mL/hr over 120 Minutes Intravenous  Once 10/25/23 0121 10/25/23 0358   10/24/23 1000  linezolid (ZYVOX) IVPB 600 mg  Status:  Discontinued        600 mg 300 mL/hr over 60 Minutes Intravenous Every 12 hours 10/24/23 0224 10/24/23 2026   10/24/23 0800  piperacillin-tazobactam (ZOSYN) IVPB 3.375 g        3.375 g 12.5 mL/hr over 240 Minutes Intravenous Every 8 hours 10/24/23 0231     10/24/23 0600  clindamycin (CLEOCIN) IVPB 600 mg  Status:  Discontinued        600 mg 100 mL/hr over 30 Minutes Intravenous Every 8 hours 10/24/23 0224 10/25/23 1634   10/24/23 0230  piperacillin-tazobactam (ZOSYN) IVPB 3.375 g  Status:  Discontinued       Placed in "And" Linked Group   3.375 g 100 mL/hr over 30 Minutes Intravenous  Once 10/24/23 0224 10/24/23 0229   10/23/23 2345  ceFEPIme (MAXIPIME) 2 g in sodium chloride 0.9 % 100 mL IVPB        2 g 200 mL/hr over 30 Minutes Intravenous  Once 10/23/23 2337 10/24/23 0035   10/23/23 2345  metroNIDAZOLE (FLAGYL) IVPB 500 mg        500  mg 100 mL/hr over 60 Minutes Intravenous  Once 10/23/23 2337 10/24/23 0127   10/23/23 2345  vancomycin (VANCOCIN) IVPB 1000 mg/200 mL premix        1,000 mg 200 mL/hr over 60 Minutes Intravenous  Once 10/23/23 2337 10/24/23 0313       Medications: Scheduled Meds:  chlorhexidine  60 mL Topical Once   Chlorhexidine Gluconate Cloth  6 each Topical Daily   dextrose       docusate sodium  100 mg Oral BID   feeding supplement  1 Container Oral TID BM   heparin injection (subcutaneous)  5,000 Units Subcutaneous Q8H   midodrine  5 mg Oral TID WC   povidone-iodine  2 Application Topical Once   sodium chloride flush  3 mL Intravenous Q12H   sodium chloride flush  5 mL Intracatheter Q8H   Continuous Infusions:  albumin human     dextrose 10 % and 0.45 % NaCl 75 mL/hr at 10/28/23 0820   famotidine (PEPCID) IV Stopped (10/27/23 2105)   linezolid (ZYVOX) IV Stopped (10/27/23 2213)  piperacillin-tazobactam (ZOSYN)  IV 3.375 g (10/28/23 0826)   PRN Meds:.acetaminophen **OR** acetaminophen, dextrose, HYDROmorphone (DILAUDID) injection, ipratropium-albuterol, lip balm, methocarbamol **OR** methocarbamol (ROBAXIN) injection, metoCLOPramide **OR** metoCLOPramide (REGLAN) injection, ondansetron **OR** ondansetron (ZOFRAN) IV, mouth rinse, oxyCODONE    Objective: Weight change:   Intake/Output Summary (Last 24 hours) at 10/28/2023 0952 Last data filed at 10/28/2023 0630 Gross per 24 hour  Intake 2541.85 ml  Output 1910 ml  Net 631.85 ml   Blood pressure (!) 128/49, pulse 77, temperature 99.9 F (37.7 C), resp. rate 17, height 5\' 5"  (1.651 m), weight 75.5 kg, SpO2 99%. Temp:  [98.1 F (36.7 C)-100.6 F (38.1 C)] 99.9 F (37.7 C) (01/29 0600) Pulse Rate:  [73-98] 77 (01/29 0200) Resp:  [13-28] 17 (01/29 0600) BP: (98-132)/(36-74) 128/49 (01/29 0600) SpO2:  [98 %-100 %] 99 % (01/29 0200)  Physical Exam: Physical Exam Constitutional:      Appearance: Normal appearance.  Eyes:      Extraocular Movements: Extraocular movements intact.  Cardiovascular:     Rate and Rhythm: Normal rate.  Pulmonary:     Effort: No respiratory distress.     Breath sounds: No wheezing.  Abdominal:     General: There is no distension.  Skin:    General: Skin is warm and dry.  Neurological:     General: No focal deficit present.      Leg wrapped  Drain with feculent material  CBC:    BMET Recent Labs    10/27/23 0559 10/28/23 0517  NA 133* 135  K 3.0* 3.8  CL 99 100  CO2 24 25  GLUCOSE 106* 104*  BUN 12 11  CREATININE 0.51 0.31*  CALCIUM 7.6* 8.3*     Liver Panel  No results for input(s): "PROT", "ALBUMIN", "AST", "ALT", "ALKPHOS", "BILITOT", "BILIDIR", "IBILI" in the last 72 hours.      Sedimentation Rate No results for input(s): "ESRSEDRATE" in the last 72 hours. C-Reactive Protein No results for input(s): "CRP" in the last 72 hours.  Micro Results: Recent Results (from the past 720 hours)  Resp panel by RT-PCR (RSV, Flu A&B, Covid) Anterior Nasal Swab     Status: None   Collection Time: 10/23/23 11:37 PM   Specimen: Anterior Nasal Swab  Result Value Ref Range Status   SARS Coronavirus 2 by RT PCR NEGATIVE NEGATIVE Final    Comment: (NOTE) SARS-CoV-2 target nucleic acids are NOT DETECTED.  The SARS-CoV-2 RNA is generally detectable in upper respiratory specimens during the acute phase of infection. The lowest concentration of SARS-CoV-2 viral copies this assay can detect is 138 copies/mL. A negative result does not preclude SARS-Cov-2 infection and should not be used as the sole basis for treatment or other patient management decisions. A negative result may occur with  improper specimen collection/handling, submission of specimen other than nasopharyngeal swab, presence of viral mutation(s) within the areas targeted by this assay, and inadequate number of viral copies(<138 copies/mL). A negative result must be combined with clinical  observations, patient history, and epidemiological information. The expected result is Negative.  Fact Sheet for Patients:  BloggerCourse.com  Fact Sheet for Healthcare Providers:  SeriousBroker.it  This test is no t yet approved or cleared by the [place] FDA and  has been authorized for detection and/or diagnosis of SARS-CoV-2 by FDA under an Emergency Use Authorization (EUA). This EUA will remain  in effect (meaning this test can be used) for the duration of the COVID-19 declaration under Section 564(b)(1) of the Act,  21 U.S.C.section 360bbb-3(b)(1), unless the authorization is terminated  or revoked sooner.       Influenza A by PCR NEGATIVE NEGATIVE Final   Influenza B by PCR NEGATIVE NEGATIVE Final    Comment: (NOTE) The Xpert Xpress SARS-CoV-2/FLU/RSV plus assay is intended as an aid in the diagnosis of influenza from Nasopharyngeal swab specimens and should not be used as a sole basis for treatment. Nasal washings and aspirates are unacceptable for Xpert Xpress SARS-CoV-2/FLU/RSV testing.  Fact Sheet for Patients: BloggerCourse.com  Fact Sheet for Healthcare Providers: SeriousBroker.it  This test is not yet approved or cleared by the [place] FDA and has been authorized for detection and/or diagnosis of SARS-CoV-2 by FDA under an Emergency Use Authorization (EUA). This EUA will remain in effect (meaning this test can be used) for the duration of the COVID-19 declaration under Section 564(b)(1) of the Act, 21 U.S.C. section 360bbb-3(b)(1), unless the authorization is terminated or revoked.     Resp Syncytial Virus by PCR NEGATIVE NEGATIVE Final    Comment: (NOTE) Fact Sheet for Patients: BloggerCourse.com  Fact Sheet for Healthcare Providers: SeriousBroker.it  This test is not yet approved or cleared by  the [place] FDA and has been authorized for detection and/or diagnosis of SARS-CoV-2 by FDA under an Emergency Use Authorization (EUA). This EUA will remain in effect (meaning this test can be used) for the duration of the COVID-19 declaration under Section 564(b)(1) of the Act, 21 U.S.C. section 360bbb-3(b)(1), unless the authorization is terminated or revoked.  Performed at [facility], [address], [place], [place] [address]   Blood Culture (routine x 2)     Status: Abnormal   Collection Time: 10/23/23 11:37 PM   Specimen: BLOOD  Result Value Ref Range Status   Specimen Description   Final    BLOOD LEFT ANTECUBITAL Performed at [facility], [address], [place], [place] [address]    Special Requests   Final    BOTTLES DRAWN AEROBIC AND ANAEROBIC Blood Culture results may not be optimal due to an inadequate volume of blood received in culture bottles Performed at [facility], [address], [place], [place] [address]    Culture  Setup Time   Final    GRAM POSITIVE COCCI IN CLUSTERS AEROBIC BOTTLE ONLY CRITICAL RESULT CALLED TO, READ BACK BY AND VERIFIED WITH: PHARMD E JACKSON 10/24/2023 @ 2345 BY AB    Culture (A)  Final    STAPHYLOCOCCUS EPIDERMIDIS THE SIGNIFICANCE OF ISOLATING THIS ORGANISM FROM A SINGLE SET OF BLOOD CULTURES WHEN MULTIPLE SETS ARE DRAWN IS UNCERTAIN. PLEASE NOTIFY THE MICROBIOLOGY DEPARTMENT WITHIN ONE WEEK IF SPECIATION AND SENSITIVITIES ARE REQUIRED. Performed at [facility], [address] [place], [place] [address]    Report Status 10/25/2023 FINAL  Final  Blood Culture ID Panel (Reflexed)     Status: Abnormal   Collection Time: 10/23/23 11:37 PM  Result Value Ref Range Status   Enterococcus faecalis NOT DETECTED NOT DETECTED Final   Enterococcus Faecium NOT DETECTED NOT DETECTED Final   Listeria monocytogenes NOT DETECTED NOT DETECTED Final   Staphylococcus species  DETECTED (A) NOT DETECTED Final    Comment: CRITICAL RESULT CALLED TO, READ BACK BY AND VERIFIED WITH: PHARMD E JACKSON 10/24/2023 @ 2345 BY AB    Staphylococcus aureus (BCID) NOT DETECTED NOT DETECTED Final   Staphylococcus epidermidis DETECTED (A) NOT DETECTED Final    Comment: Methicillin (oxacillin) resistant coagulase negative staphylococcus. Possible blood  culture contaminant (unless isolated from more than one blood culture draw or clinical case suggests pathogenicity). No antibiotic treatment is indicated for blood  culture contaminants. CRITICAL RESULT CALLED TO, READ BACK BY AND VERIFIED WITH: PHARMD E JACKSON 10/24/2023 @ 2345 BY AB    Staphylococcus lugdunensis NOT DETECTED NOT DETECTED Final   Streptococcus species NOT DETECTED NOT DETECTED Final   Streptococcus agalactiae NOT DETECTED NOT DETECTED Final   Streptococcus pneumoniae NOT DETECTED NOT DETECTED Final   Streptococcus pyogenes NOT DETECTED NOT DETECTED Final   A.calcoaceticus-baumannii NOT DETECTED NOT DETECTED Final   Bacteroides fragilis NOT DETECTED NOT DETECTED Final   Enterobacterales NOT DETECTED NOT DETECTED Final   Enterobacter cloacae complex NOT DETECTED NOT DETECTED Final   Escherichia coli NOT DETECTED NOT DETECTED Final   Klebsiella aerogenes NOT DETECTED NOT DETECTED Final   Klebsiella oxytoca NOT DETECTED NOT DETECTED Final   Klebsiella pneumoniae NOT DETECTED NOT DETECTED Final   Proteus species NOT DETECTED NOT DETECTED Final   Salmonella species NOT DETECTED NOT DETECTED Final   Serratia marcescens NOT DETECTED NOT DETECTED Final   Haemophilus influenzae NOT DETECTED NOT DETECTED Final   Neisseria meningitidis NOT DETECTED NOT DETECTED Final   Pseudomonas aeruginosa NOT DETECTED NOT DETECTED Final   Stenotrophomonas maltophilia NOT DETECTED NOT DETECTED Final   Candida albicans NOT DETECTED NOT DETECTED Final   Candida auris NOT DETECTED NOT DETECTED Final   Candida glabrata NOT DETECTED NOT  DETECTED Final   Candida krusei NOT DETECTED NOT DETECTED Final   Candida parapsilosis NOT DETECTED NOT DETECTED Final   Candida tropicalis NOT DETECTED NOT DETECTED Final   Cryptococcus neoformans/gattii NOT DETECTED NOT DETECTED Final   Methicillin resistance mecA/C DETECTED (A) NOT DETECTED Final    Comment: CRITICAL RESULT CALLED TO, READ BACK BY AND VERIFIED WITH: PHARMD E JACKSON 10/24/2023 @ 2345 BY AB Performed at [facility], [address], [place], [place] [address]   Blood Culture (routine x 2)     Status: None (Preliminary result)   Collection Time: 10/24/23 12:41 AM   Specimen: BLOOD  Result Value Ref Range Status   Specimen Description   Final    BLOOD BLOOD RIGHT ARM Performed at [facility], [address], [place], [place] [address]    Special Requests   Final    BOTTLES DRAWN AEROBIC AND ANAEROBIC Blood Culture results may not be optimal due to an inadequate volume of blood received in culture bottles Performed at [facility], [address], [place], [place] [address]    Culture   Final    NO GROWTH 4 DAYS Performed at [facility], [address], [place], [place] [address]    Report Status PENDING  Incomplete  MRSA Next Gen by PCR, Nasal     Status: None   Collection Time: 10/24/23  1:02 PM   Specimen: Nasal Mucosa; Nasal Swab  Result Value Ref Range Status   MRSA by PCR Next Gen NOT DETECTED NOT DETECTED Final    Comment: (NOTE) The GeneXpert MRSA Assay (FDA approved for NASAL specimens only), is one component of a comprehensive MRSA colonization surveillance program. It is not intended to diagnose MRSA infection nor to guide or monitor treatment for MRSA infections. Test performance is not FDA approved in patients less than 63 years old. Performed at [facility], [address], [place], [place] [address]   Aerobic/Anaerobic Culture w Gram Stain (surgical/deep wound)  Status:  None   Collection Time: 10/24/23  4:22 PM   Specimen: Abscess  Result Value Ref Range Status   Specimen Description   Final    ABSCESS DRAIN Performed at [facility], [address] [place], [place] [address]    Special Requests   Final    NONE Performed at [facility], [address], [place], [place] [address]    Gram Stain   Final    FEW WBC PRESENT, PREDOMINANTLY PMN ABUNDANT GRAM POSITIVE COCCI MODERATE GRAM NEGATIVE RODS MODERATE GRAM POSITIVE RODS    Culture   Final    ABUNDANT ESCHERICHIA COLI ABUNDANT KLEBSIELLA OXYTOCA ABUNDANT ENTEROCOCCUS FAECIUM MODERATE PSEUDOMONAS AERUGINOSA SUSCEPTIBILITIES PERFORMED ON PREVIOUS CULTURE WITHIN THE LAST 5 DAYS. ABUNDANT PARABACTEROIDES DISTASONIS BETA LACTAMASE POSITIVE Performed at [facility], [address] [place], [place] [address]    Report Status 10/27/2023 FINAL  Final   Organism ID, Bacteria ENTEROCOCCUS FAECIUM  Final   Organism ID, Bacteria PSEUDOMONAS AERUGINOSA  Final      Susceptibility   Enterococcus faecium - MIC*    AMPICILLIN >=32 RESISTANT Resistant     VANCOMYCIN <=0.5 SENSITIVE Sensitive     GENTAMICIN SYNERGY SENSITIVE Sensitive     * ABUNDANT ENTEROCOCCUS FAECIUM   Pseudomonas aeruginosa - MIC*    CEFTAZIDIME <=1 SENSITIVE Sensitive     CIPROFLOXACIN <=0.25 SENSITIVE Sensitive     GENTAMICIN <=1 SENSITIVE Sensitive     IMIPENEM 0.5 SENSITIVE Sensitive     PIP/TAZO <=4 SENSITIVE Sensitive ug/mL    CEFEPIME 0.25 SENSITIVE Sensitive     * MODERATE PSEUDOMONAS AERUGINOSA  Aerobic/Anaerobic Culture w Gram Stain (surgical/deep wound)     Status: None (Preliminary result)   Collection Time: 10/24/23  9:44 PM   Specimen: Abscess; Wound  Result Value Ref Range Status   Specimen Description   Final    ABSCESS Performed at [facility], [address], [place], [place] [address]    Special Requests   Final    ABSCESS Performed at  [facility], [address], [place], [place] [address]    Gram Stain   Final    FEW WBC PRESENT, PREDOMINANTLY PMN MODERATE GRAM NEGATIVE RODS MODERATE GRAM POSITIVE COCCI    Culture   Final    MODERATE KLEBSIELLA OXYTOCA FEW PSEUDOMONAS AERUGINOSA FEW ENTEROCOCCUS FAECIUM SUSCEPTIBILITIES PERFORMED ON PREVIOUS CULTURE WITHIN THE LAST 5 DAYS. HOLDING FOR POSSIBLE ANAEROBE Performed at [facility], [address] [place], [place] [address]    Report Status PENDING  Incomplete   Organism ID, Bacteria KLEBSIELLA OXYTOCA  Final      Susceptibility   Klebsiella oxytoca - MIC*    AMPICILLIN RESISTANT Resistant     CEFEPIME <=0.12 SENSITIVE Sensitive     CEFTAZIDIME <=1 SENSITIVE Sensitive     CEFTRIAXONE <=0.25 SENSITIVE Sensitive     CIPROFLOXACIN <=0.25 SENSITIVE Sensitive     GENTAMICIN <=1 SENSITIVE Sensitive     IMIPENEM <=0.25 SENSITIVE Sensitive     TRIMETH/SULFA <=20 SENSITIVE Sensitive     AMPICILLIN/SULBACTAM <=2 SENSITIVE Sensitive     PIP/TAZO <=4 SENSITIVE Sensitive ug/mL    * MODERATE KLEBSIELLA OXYTOCA  Aerobic/Anaerobic Culture w Gram Stain (surgical/deep wound)     Status: None (Preliminary result)   Collection Time: 10/24/23  9:47 PM   Specimen: Path Tissue  Result Value Ref Range Status   Specimen Description   Final    TISSUE Performed at [facility], [address] [place], [place] [address]    Special Requests   Final    TISSUE Performed at [facility], [address], [place], [place] [address]    Gram Stain   Final    RARE WBC PRESENT, PREDOMINANTLY PMN FEW GRAM POSITIVE COCCI RARE GRAM NEGATIVE RODS    Culture   Final    MODERATE ESCHERICHIA COLI MODERATE KLEBSIELLA OXYTOCA CULTURE REINCUBATED FOR BETTER GROWTH HOLDING FOR POSSIBLE ANAEROBE Performed at [facility], [address] [place], [place] [address]    Report Status PENDING  Incomplete   Organism ID, Bacteria  ESCHERICHIA COLI  Final   Organism ID, Bacteria KLEBSIELLA OXYTOCA  Final      Susceptibility   Escherichia coli - MIC*    AMPICILLIN >=32 RESISTANT Resistant     CEFEPIME <=0.12 SENSITIVE Sensitive     CEFTAZIDIME <=1 SENSITIVE Sensitive     CEFTRIAXONE <=0.25 SENSITIVE Sensitive     CIPROFLOXACIN <=0.25 SENSITIVE Sensitive     GENTAMICIN <=1 SENSITIVE Sensitive     IMIPENEM <=0.25 SENSITIVE Sensitive     TRIMETH/SULFA <=20 SENSITIVE Sensitive     AMPICILLIN/SULBACTAM 8 SENSITIVE Sensitive     PIP/TAZO <=4 SENSITIVE Sensitive ug/mL    * MODERATE ESCHERICHIA COLI   Klebsiella oxytoca - MIC*    AMPICILLIN RESISTANT Resistant     CEFEPIME <=0.12 SENSITIVE Sensitive     CEFTAZIDIME <=1 SENSITIVE Sensitive     CEFTRIAXONE <=0.25 SENSITIVE Sensitive     CIPROFLOXACIN <=0.25 SENSITIVE Sensitive     GENTAMICIN <=1 SENSITIVE Sensitive     IMIPENEM <=0.25 SENSITIVE Sensitive     TRIMETH/SULFA <=20 SENSITIVE Sensitive     AMPICILLIN/SULBACTAM <=2 SENSITIVE Sensitive     PIP/TAZO <=4 SENSITIVE Sensitive ug/mL    * MODERATE KLEBSIELLA OXYTOCA    Studies/Results: No results found.     Assessment/Plan:  INTERVAL HISTORY:   Pt came of of levophed  Principal Problem:   Diverticulitis of large intestine with abscess Active Problems:   Infective myositis of left thigh   AKI (acute kidney injury) (HCC)   PAD (peripheral artery disease) (HCC)   Hyperglycemia   Protein-calorie malnutrition, severe   Necrotizing soft tissue infection   Necrotizing fasciitis (HCC)   Polymicrobial bacterial infection    Ashley Galloway is a 73 y.o. female with vascular disease prior diverticulitis with hepatic abscess s/p IR guided drain who had been treated for Streptococcus intermedius hepatic abscess and bacteremia in December 2024.  She had finished ceftriaxone and metronidazole through October 14, 2023 and then on cefadroxil for an additional 2 weeks.  She developed nausea and vomiting.   In the interim she had also developed left leg pain.  She was admitted to the hospital with septic shock.  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 A new 6.9 cm collection of likely fecal material and air seen in the LLQ abutting the sigmoid colon concerning for perforation with abscess, liver still with ill defined lesion though smaller than before. She had fluid and air on CT that was tracking into the abductor muscle concerning for myositis.  CT of the lower extremity showed extensive air tracking along the fascial planes left hip groin thigh and posterior calf musculature concerning for necrotizing fasciitis.  Went incision and debridement of left thigh abscess and medial thigh in the evening of the 25th.  Apparently there was dishwater foul-smelling fluid and devitalized fascial tissue seen and cultures were sent.  She has also had IR guided drain placed into feculent material from LLQ  Her blood cultures are growing MRSE and from 1 of 2 sites that have been isolated her antimicrobials have been broadened from vancomycin Zosyn and clindamycin to Zosyn Zyvox and micafungin by my partner Dr. Drue Second  Cultures are now showing Klebsiella oxytoca ane E coli from one  abscess culture on the 25th as well as Klebsiella oxytoca Enterococcus faecium(AMP R) and moderate Pseudomonas aeruginosa from another abscess from same date and and another culture with these 3 orgnansims and E coli    #1  Diverticular abscess with extension of infection of the left thigh and leg causing necrotizing fasciitis status post I&D   Diverticular abscess with extension of infection into left thigh and leg with passing fasciitis status post I&D  Continue Zyvox and Zosyn.  Pressors are now  gone  #2  Necrotizing soft tissue infection involving thigh:  Patient to go to [facility] for surgery on Friday.  When this happens we will change her over to the [facility] infectious ease rounding team so that we can follow her culture data and organize her antibiotics there  I have personally spent 50 minutes involved in face-to-face and non-face-to-face activities for this patient on the day of the visit. Professional time spent includes the following activities: Preparing to see the patient (review of tests), Obtaining and/or reviewing separately obtained history (admission/discharge record), Performing a medically appropriate examination and/or evaluation , Ordering medications/tests/procedures, referring and communicating with other health care professionals, Documenting clinical information in the EMR, Independently interpreting results (not separately reported), Communicating results to the patient/family/caregiver, Counseling and educating the patient/family/caregiver and Care coordination (not separately reported).   Evaluation of the patient requires complex antimicrobial therapy evaluation, counseling , isolation needs to reduce disease transmission and risk assessment and mitigation.    Ashley Galloway 10/28/2023, 9:52 AM

## 2023-10-28 NOTE — Progress Notes (Addendum)
Hypoglycemic Event  CBG: 65  Treatment: 8 oz juice/soda  Symptoms: None  Follow-up CBG: Time:0822 CBG Result:69  Possible Reasons for Event: Inadequate meal intake  Comments/MD notified: Dr. Francine Graven  12.5mg  D50 given.   Repeat CBG: 0856 CBG Result: 98  Per [place], Recheck in 30 minutes 30 min recheck 100  Nanci Pina

## 2023-10-28 NOTE — Progress Notes (Signed)
    Subjective:  Patient reports pain as mild to moderate.  Denies N/V/CP/SOB.  She reports pain controlled with medication.  She is lethargic during exam but A&O Husband at bedside plan [facility].    Objective:   VITALS:   Vitals:   10/28/23 1200 10/28/23 1300 10/28/23 1315 10/28/23 1400  BP: 127/66 129/61  (!) 125/58  Pulse:      Resp: 17 (!) 21 18 16   Temp: (!) 100.6 F (38.1 C) (!) 100.9 F (38.3 C) (!) 100.9 F (38.3 C) (!) 100.9 F (38.3 C)  TempSrc:      SpO2:      Weight:      Height:        Patient lethargic during exam. A&O x3.  Neurovascular intact Sensation intact distally Intact pulses distally Dorsiflexion/Plantar flexion intact Wound vac 125 mmHg no leaks detected. 250cc SS output.  Ace bandage from foot to thigh.   Lab Results  Component Value Date   WBC 9.6 10/28/2023   HGB 7.3 (L) 10/28/2023   HCT 22.8 (L) 10/28/2023   MCV 87.0 10/28/2023   PLT 193 10/28/2023   BMET    Component Value Date/Time   NA 135 10/28/2023 0517   K 3.8 10/28/2023 0517   CL 100 10/28/2023 0517   CO2 25 10/28/2023 0517   GLUCOSE 104 (H) 10/28/2023 0517   BUN 11 10/28/2023 0517   CREATININE 0.31 (L) 10/28/2023 0517   CALCIUM 8.3 (L) 10/28/2023 0517   GFRNONAA >60 10/28/2023 0517     Assessment/Plan: 4 Days Post-Op   Principal Problem:   Diverticulitis of large intestine with abscess Active Problems:   Infective myositis of left thigh   AKI (acute kidney injury) (HCC)   PAD (peripheral artery disease) (HCC)   Hyperglycemia   Protein-calorie malnutrition, severe   Necrotizing soft tissue infection   Necrotizing fasciitis (HCC)   Polymicrobial bacterial infection   Continue IV antibiotics and wound VAC No current need for further surgical intervention today. Continue wound vac. Plan to transfer to [facility] for Dr. Lajoyce Corners to resume care of LLE ASAP.  Clois Dupes 10/28/2023, 2:46 PM   [facility]  [place] [address], [place], [place] [address] Phone: [phone] [email or web address] Facebook  [employer]

## 2023-10-28 NOTE — Progress Notes (Signed)
4 Days Post-Op   Subjective/Chief Complaint: Drowsy this am and requires a lot of encouragement for conversation. She denies significant abdominal pain but does grimace on exam. She did try fulls yesterday but did not drink much secondary to low appetite. She denies abdominal pain, bloating, or nausea limiting PO intake.  Objective: Vital signs in last 24 hours: Temp:  [98.1 F (36.7 C)-100.6 F (38.1 C)] 99.9 F (37.7 C) (01/29 0600) Pulse Rate:  [73-98] 77 (01/29 0200) Resp:  [13-28] 17 (01/29 0600) BP: (98-135)/(36-94) 128/49 (01/29 0600) SpO2:  [98 %-100 %] 99 % (01/29 0200) Last BM Date :  (PTA)  Intake/Output from previous day: 01/28 0701 - 01/29 0700 In: 2942.6 [I.V.:1555.7; IV Piggyback:1376.9] Out: 1910 [Urine:1650; Drains:260] Intake/Output this shift: No intake/output data recorded.  PE: General: pleasant, WD, female who is laying in bed in NAD. somnolent Lungs: Respiratory effort nonlabored Abd: soft, ND, drain LLQ with brown, feculent output - 10 ml/24h. Grimaces with palpation of abdomen diffusely but verbally denies pain MSK: LLE with upper thigh wrapped in ace   Lab Results:  Recent Labs    10/27/23 0559 10/28/23 0517  WBC 12.3* 9.6  HGB 7.8* 7.3*  HCT 23.1* 22.8*  PLT 221 193   BMET Recent Labs    10/27/23 0559 10/28/23 0517  NA 133* 135  K 3.0* 3.8  CL 99 100  CO2 24 25  GLUCOSE 106* 104*  BUN 12 11  CREATININE 0.51 0.31*  CALCIUM 7.6* 8.3*   PT/INR No results for input(s): "LABPROT", "INR" in the last 72 hours.  ABG No results for input(s): "PHART", "HCO3" in the last 72 hours.  Invalid input(s): "PCO2", "PO2"   Studies/Results: No results found.   Anti-infectives: Anti-infectives (From admission, onward)    Start     Dose/Rate Route Frequency Ordered Stop   10/26/23 0400  vancomycin (VANCOREADY) IVPB 1500 mg/300 mL  Status:  Discontinued        1,500 mg 150 mL/hr over 120 Minutes Intravenous Every 24 hours 10/25/23 0725  10/26/23 0754   10/26/23 0002  ceFAZolin (ANCEF) IVPB 2g/100 mL premix        2 g 200 mL/hr over 30 Minutes Intravenous Every 6 hours 10/26/23 0002 10/26/23 0650   10/25/23 2200  linezolid (ZYVOX) IVPB 600 mg        600 mg 300 mL/hr over 60 Minutes Intravenous Every 12 hours 10/25/23 1634     10/25/23 1800  micafungin (MYCAMINE) 100 mg in sodium chloride 0.9 % 100 mL IVPB  Status:  Discontinued        100 mg 105 mL/hr over 1 Hours Intravenous Every 24 hours 10/25/23 1645 10/27/23 1332   10/25/23 0215  vancomycin (VANCOREADY) IVPB 1500 mg/300 mL        1,500 mg 150 mL/hr over 120 Minutes Intravenous  Once 10/25/23 0121 10/25/23 0358   10/24/23 1000  linezolid (ZYVOX) IVPB 600 mg  Status:  Discontinued        600 mg 300 mL/hr over 60 Minutes Intravenous Every 12 hours 10/24/23 0224 10/24/23 2026   10/24/23 0800  piperacillin-tazobactam (ZOSYN) IVPB 3.375 g        3.375 g 12.5 mL/hr over 240 Minutes Intravenous Every 8 hours 10/24/23 0231     10/24/23 0600  clindamycin (CLEOCIN) IVPB 600 mg  Status:  Discontinued        600 mg 100 mL/hr over 30 Minutes Intravenous Every 8 hours 10/24/23 0224 10/25/23 1634   10/24/23 0230  piperacillin-tazobactam (ZOSYN) IVPB 3.375 g  Status:  Discontinued       Placed in "And" Linked Group   3.375 g 100 mL/hr over 30 Minutes Intravenous  Once 10/24/23 0224 10/24/23 0229   10/23/23 2345  ceFEPIme (MAXIPIME) 2 g in sodium chloride 0.9 % 100 mL IVPB        2 g 200 mL/hr over 30 Minutes Intravenous  Once 10/23/23 2337 10/24/23 0035   10/23/23 2345  metroNIDAZOLE (FLAGYL) IVPB 500 mg        500 mg 100 mL/hr over 60 Minutes Intravenous  Once 10/23/23 2337 10/24/23 0127   10/23/23 2345  vancomycin (VANCOCIN) IVPB 1000 mg/200 mL premix        1,000 mg 200 mL/hr over 60 Minutes Intravenous  Once 10/23/23 2337 10/24/23 0313       Assessment/Plan: Hx of hepatic abscess/diverticulitis with abscess and left leg necrotizing fasciitis s/p debridement by ortho  Dr. Linna Caprice 1/25  S/p IR drain of LLQ abd abscess 1/25  - tmax 100.53F and WBC normal 9.6 this am , continue to follow and monitor - Drain functioning - abdominal exam seems overall stable and abdomen soft. - cultures from debridement with E. Coli, Klebsiella oxytoca - IR drain cx with Klebsiella oxytoca, resistance to amp, otherwise sensitive. - Monitor drain and continue IV abx per ID (changed to piptzo/linezolid/micafungin 1/26) - continue FLD today and agree with cortrak and Tfs for nutrition while continuing to work on advancing diet -controlled with drain for now, but need to consider next steps for patient as her drain appears most consistent with a fistula and clearly her diverticulitis is the etiology of current situation. - plans to tx to [facility] on Friday for Dr. Lajoyce Corners to take over her leg.  FEN: FLD, TFs ID: zosyn, micafungan, zyvox VTE: hep subq   LOS: 4 days   I reviewed Consultant IR, ortho, ID notes, hospitalist notes, last 24 h vitals and pain scores, last 48 h intake and output, last 24 h labs and trends, and last 24 h imaging results.   Eric Form, [facility] 10/28/2023, 8:20 AM Please see Amion for pager number during day hours 7:00am-4:30pm

## 2023-10-28 NOTE — Progress Notes (Signed)
Foley catheter indicated due to wounds

## 2023-10-28 NOTE — Progress Notes (Signed)
NAME:  Ashley Galloway, MRN:  [id number], DOB:  Feb 03, 1951, LOS: 4 ADMISSION DATE:  10/23/2023, CONSULTATION DATE:  10/24/2023 REFERRING MD: Azucena Fallen, MD, CHIEF COMPLAINT: Septic shock since 4 pm   History of Present Illness:  A 73 y.o. female with HTN, dyslipidemia, PVD, COPD, tobacco smoking (quit few months ago), recent Streptococcus intermedius bacteremia, hepatic abscess (percutaneous drain d/c on 10/20/2022), and diverticulitis in Dec 2024 who completed antibiotics few days ago. She presented to ER 10/24/2023 in am with c/o LLQ swelling and erythema, and left thigh pain for few days. She has fever, chills, general malaise and loss of appetite, but denies chest pain, cough, SOB, wheezing, or N/V/D. She reports constipation.  New CT imaging finds new large LLQ abscess with associated diverticulitis as well as fluid/air into abductor muscle concerning for myositis.  Received 4.5 L LR and on her 5th L of LR boluses Started on IV abx: Vancomycin, Cefepime, and Flagyl--->Linezolid, Zosyn and Clindamycin. General surgery consulted and request IR, s/p perc drain to abdominal abscess. Developed Afib in ICU, new   Pertinent  Medical History  HTN, dyslipidemia, PVD, COPD, tobacco smoking (quit few months ago), recent Streptococcus intermedius bacteremia, hepatic abscess (percutaneous drain d/c on 10/20/2022), diverticulitis in Dec 2024  Significant Hospital Events: Including procedures, antibiotic start and stop dates in addition to other pertinent events   1/25 to OR for debridement of necrotizing fasciitis of LLE, IR placed LLQ drain for abscess  Interim History / Subjective:   Weaned off levophed Started on midodrine yesterday, unable to take this mornings dose due to lethargy Having episodes of hypoglycemia this morning despite being on D10 at 71mL/hr Tmax 100.36F this AM  Objective   Blood pressure (!) 128/49, pulse 77, temperature 99.9 F (37.7 C), resp. rate 17,  height 5\' 5"  (1.651 m), weight 75.5 kg, SpO2 99%.        Intake/Output Summary (Last 24 hours) at 10/28/2023 4098 Last data filed at 10/28/2023 0630 Gross per 24 hour  Intake 2932.61 ml  Output 1910 ml  Net 1022.61 ml   Filed Weights   10/23/23 2328 10/24/23 1415  Weight: 71.3 kg 75.5 kg   Examination: Gen: no acute distress, laying bed HEENT: [place]/AT, moist mucous membranes, sclera anicteric Resp: clear to auscultation, no wheezing Abdomen: soft, JP drain with feculent drainage Extremities: Left leg is wrapped in dressing, distal extremities warm and well perfused  Leg wound cultures from OR - GPC, GNR on gram stain. Moderate Klebsiella oxytoca growth BCID - positive for staph and strep LLQ Drain culture Klebsiella oxytoca, enterococcus faecium, pseudomonas aeruginosa  Resolved Hospital Problem list   AKI Acute metabolic encephalopathy  Shock Hypokalemia Hyponatremia  Assessment & Plan:  Sepsis due to intra-abdominal abscess/diverticulitis and left thigh necrotizing fascitis.  S/p CT guided drain placement 1/25 for abdominal abscess - multiorganism S/p excision and debridement of LLE with wound vac placement 1/25 Staph/strep Bacteremia Complicated by recent strep intermedius bacteremia, hepatic abscess (drain was d/c 10/21/2023), and chronic diverticulosis.  -General Surgery, following. Considering plan for Hartman's procedure. - Ortho following, plan for OR on Friday at [facility] for further evaluation of LLE - continue linzeolid, zosyn per ID. Discontinue micafungin 1/28. - continue midodrine 5mg  TID  Acute Metabolic Encephalopathy - monitor mental status, more lethargic this morning - Concern for worsening infection vs narcotic medications  Volume overload - start lasix 20mg  IV this morning - goal net even to -1L  New Afib due to sepsis 10/05/2023: Echo EF 60%,  G1DD Optimize electroltes K>4, Mg>2 - back in NSR  COPD -Duoneb PRN   HTN and dyslipidemia -Hold home  meds  Moderate Protein calorie malnutrition -Dietitian consult - protein boost supplement - poor oral intake, unable to keep up with needs - place cortrak today to start tube feeds  Hypoglycemia Pre-DM (HbA1c 6.3%) - Continue D10+ 1/2NS - q4hr glucose checks   Elevated INR: recent IV and PO Abx and sepsis now -trend   Anxiety: well-controlled  Lumbar stenosis, no current complaints  Peripheral arterial disease, chronic  Best Practice (right click and "Reselect all SmartList Selections" daily)   Diet/type: clear liquids DVT prophylaxis prophylactic heparin  Pressure ulcer(s): N/A GI prophylaxis: H2B Lines: Central line Foley:  Yes, and it is still needed Code Status:  full code Last date of multidisciplinary goals of care discussion [husband updated at bedside 1/26 am]   Critical care time:     The patient is critically ill due to septic shock.  Critical care was necessary to treat or prevent imminent or life-threatening deterioration.  Critical care was time spent personally by me on the following activities: development of treatment plan with patient and/or surrogate as well as nursing, discussions with consultants, evaluation of patient's response to treatment, examination of patient, obtaining history from patient or surrogate, ordering and performing treatments and interventions, ordering and review of laboratory studies, ordering and review of radiographic studies, pulse oximetry, re-evaluation of patient's condition and participation in multidisciplinary rounds.   Critical Care Time devoted to patient care services described in this note is 33 minutes. This time reflects time of care of this signee Martina Sinner . This critical care time does not reflect separately billable procedures or procedure time, teaching time or supervisory time of PA/NP/Med student/Med Resident etc but could involve care discussion time.       Melody Comas, MD [facility] Office: [phone]   See Amion for personal pager PCCM on call pager [phone] until 7pm. Please call Elink 7p-7a. [phone]

## 2023-10-28 NOTE — Progress Notes (Signed)
Pharmacy Antibiotic Note  Ashley Galloway is a 73 y.o. female admitted on 10/23/2023 with diverticular abscess with extension of infection into left thigh causing necrotizing fasciitis. S/p I&D. ID following for polymicrobial infection.   Pharmacy has been consulted for piperacillin/tazobactam dosing.  Today, 10/28/23 WBC WNL SCr low Afebrile this morning  Plan: Continue piperacillin/tazobactam 3.375 g IV q8h EI Linezolid 600 mg IV q12h Continue to follow renal function  Height: 5\' 5"  (165.1 cm) Weight: 75.5 kg (166 lb 7.2 oz) IBW/kg (Calculated) : 57  Temp (24hrs), Avg:99.6 F (37.6 C), Min:98.1 F (36.7 C), Max:100.6 F (38.1 C)  Recent Labs  Lab 10/24/23 0158 10/24/23 0410 10/24/23 1323 10/24/23 2107 10/25/23 0346 10/25/23 0918 10/25/23 2102 10/26/23 0311 10/27/23 0559 10/28/23 0517  WBC  --  11.2*  --   --  6.6  --   --  10.1 12.3* 9.6  CREATININE  --  0.98  --    < > 0.53  --  0.52 <0.30* 0.51 0.31*  LATICACIDVEN 6.7* 5.9* 3.7*  --  3.1* 2.6*  --   --   --   --    < > = values in this interval not displayed.    Estimated Creatinine Clearance: 64.6 mL/min (A) (by C-G formula based on SCr of 0.31 mg/dL (L)).    Allergies  Allergen Reactions   Tape Other (See Comments)    Tape from hormone patch.   Cindi Carbon, PharmD 10/28/2023 11:25 AM

## 2023-10-28 NOTE — Inpatient Diabetes Management (Signed)
Inpatient Diabetes Program Recommendations  AACE/ADA: New Consensus Statement on Inpatient Glycemic Control (2015)  Target Ranges:  Prepandial:   less than 140 mg/dL      Peak postprandial:   less than 180 mg/dL (1-2 hours)      Critically ill patients:  140 - 180 mg/dL   Lab Results  Component Value Date   GLUCAP 67 (L) 10/28/2023   HGBA1C 6.3 (H) 10/24/2023    Review of Glycemic Control  Latest Reference Range & Units 10/27/23 22:51 10/28/23 03:46 10/28/23 07:53 10/28/23 07:56  Glucose-Capillary 70 - 99 mg/dL 161 (H) 83 65 (L) 67 (L)  (H): Data is abnormally high (L): Data is abnormally low  Diabetes history: Pre-DM Outpatient Diabetes medications: none Current orders for Inpatient glycemic control: Novolog 2-6 units Q4H  Inpatient Diabetes Program Recommendations:    Please consider:  Novolog 0-9 units Q4H (less aggressive correction)  Will continue to follow while inpatient.  Thank you, Dulce Sellar, MSN, CDCES Diabetes Coordinator Inpatient Diabetes Program [phone] (team pager from 8a-5p)

## 2023-10-28 NOTE — Progress Notes (Signed)
Nutrition Follow-up  DOCUMENTATION CODES:   Severe malnutrition in context of acute illness/injury  INTERVENTION:  - Per discussion with CCM, plan for trickle feeds only today: Osmolite 1.5 at 29mL/hr provides 720 kcals, 30g protein, and free water  - Monitor magnesium, potassium, and phosphorus BID for at least 3 days, MD to replete as needed, as pt is at risk for refeeding syndrome.  - Recommend discontinue D10 now that starting tube feeds.  - Once able to advance past trickle TF, recommend below: Osmolite 1.5 at 55 ml/h (1320 ml per day) *Would recommend increasing by 10mL Q12H Prosource TF20 60 ml BID Provides 2140 kcal, 123 gm protein, 1006 ml free water daily  - Full Liquid diet.  - Ensure Plus High Protein po BID, each supplement provides 350 kcal and 20 grams of protein.  - Add Multivitamin with minerals daily, 500mg  vitamin C BID, and 220mg  zinc x14 days to support wound healing.  - Checking micronutrient labs:  - Monitor weight trend.   NUTRITION DIAGNOSIS:   Severe Malnutrition related to acute illness (intra-abdominal abscess/diverticulitis and left thigh necrotizing fascitis) as evidenced by mild fat depletion, mild muscle depletion, energy intake < or equal to 50% for > or equal to 5 days, percent weight loss (17% in less than 1 month). *new  GOAL:   Patient will meet greater than or equal to 90% of their needs *progressing, starting TF  MONITOR:   PO intake, Supplement acceptance, Diet advancement, Labs, Weight trends, TF tolerance  REASON FOR ASSESSMENT:   Consult Assessment of nutrition requirement/status, Calorie Count  ASSESSMENT:   73 y.o. female with PMH HTN, COPD, diverticulitis who presented with c/o LLQ swelling and erythema, and left thigh pain for few days. Admitted for sepsis due to intra-abdominal abscess/diverticulitis and left thigh necrotizing fascitis.  1/25 Admit; s/p excision and debridement of LLE with wound vac  placement 1/27 CLD 1/28 FLD 1/29 SBFT placed  Patient asleep at time of visit this A, brother at bedside.  He is unsure of a UBW but reports patient has lost a lot of weight over the past ~3 weeks due to being sick.  Per EMR, patient weighed at 189# on 1/9 and weighed at 157# upon admission. This is a 32# or 17% weight loss in ~3 weeks, which would be severe for the time frame.   Brother reports patient typically eats 3 meals a day but over the past 3 weeks has had very poor appetite and has been eating much smaller portions at meals. Does drink Boost Plus at home. Brother estimates around 1-2 times a day.   Brother feels she has been doing fairly well on full liquids. However, he notes she is more tired today than she has been since admission. She was ordered [employer] but she does not like it. Will switch to Boost Plus instead.   After visit this AM, CCM discussed with family and plan for a SBFT to be placed.  SBFT placed after visit and now xray verified in the stomach (near the pylorus).   Presented back to bedside this afternoon. Patient still sleeping but husband now present.  He confirms she has lost a lot of weight over the past few weeks and had been progressively eating less. Notes she didn't have hardly anything to eat during the ~1 week prior to admission.   Discussed plan to start trickle tube feeds today and advance tube feeds tomorrow. Husband endorsed understanding. She remains on a full liquid diet at  this time. Minimal intake due sleeping. Husband reports patient only likes vanilla Boost, so will switch to vanilla Ensure instead (only chocolate Boost available in house). He also brought in [employer] for her which patient likes.   Discussed adding vitamins/minerals to support wound healing and checking labs for deficiencies given these wounds. Husband on board with plan.  Of note, patient to transfer to [facility] tomorrow/Friday to be followed by Dr.  Lajoyce Corners.    Medications reviewed and include: Colace, D10@ 38mL/hr (provides 612 kcals over 24 hours)  Labs reviewed:  HA1C 6.3 Blood Glucose 65-187 x24 hours   NUTRITION - FOCUSED PHYSICAL EXAM:  Flowsheet Row Most Recent Value  Orbital Region Mild depletion  Upper Arm Region Moderate depletion  Thoracic and Lumbar Region Unable to assess  Buccal Region No depletion  Temple Region Moderate depletion  Clavicle Bone Region Mild depletion  Clavicle and Acromion Bone Region Mild depletion  Scapular Bone Region Unable to assess  Dorsal Hand No depletion  Patellar Region No depletion  Anterior Thigh Region No depletion  Posterior Calf Region No depletion  Edema (RD Assessment) Mild  Hair Reviewed  Eyes Unable to assess  Mouth Reviewed  Skin Reviewed  Nails Reviewed       Diet Order:   Diet Order             Diet full liquid Fluid consistency: Thin  Diet effective now                   EDUCATION NEEDS:  Education needs have been addressed  Skin:  Skin Assessment: Reviewed RN Assessment  Last BM:  PTA  Height:  Ht Readings from Last 1 Encounters:  10/24/23 5\' 5"  (1.651 m)   Weight:  Wt Readings from Last 1 Encounters:  10/24/23 75.5 kg    BMI:  Body mass index is 27.7 kg/m.  Estimated Nutritional Needs:  Kcal:  2000-2300 kcals Protein:  100-120 grams Fluid:  >/= 2L    Shelle Iron RD, LDN Contact via Secure Chat.

## 2023-10-29 DIAGNOSIS — K5720 Diverticulitis of large intestine with perforation and abscess without bleeding: Secondary | ICD-10-CM | POA: Diagnosis not present

## 2023-10-29 DIAGNOSIS — E440 Moderate protein-calorie malnutrition: Secondary | ICD-10-CM | POA: Diagnosis not present

## 2023-10-29 DIAGNOSIS — M7989 Other specified soft tissue disorders: Secondary | ICD-10-CM | POA: Diagnosis not present

## 2023-10-29 DIAGNOSIS — M726 Necrotizing fasciitis: Secondary | ICD-10-CM | POA: Diagnosis not present

## 2023-10-29 LAB — BASIC METABOLIC PANEL
Anion gap: 11 (ref 5–15)
BUN: 5 mg/dL — ABNORMAL LOW (ref 8–23)
CO2: 26 mmol/L (ref 22–32)
Calcium: 8.1 mg/dL — ABNORMAL LOW (ref 8.9–10.3)
Chloride: 95 mmol/L — ABNORMAL LOW (ref 98–111)
Creatinine, Ser: 0.51 mg/dL (ref 0.44–1.00)
GFR, Estimated: >60 mL/min (ref >60)
Glucose, Bld: 137 mg/dL — ABNORMAL HIGH (ref 70–99)
Potassium: 3.8 mmol/L (ref 3.5–5.1)
Sodium: 132 mmol/L — ABNORMAL LOW (ref 135–145)

## 2023-10-29 LAB — CULTURE, BLOOD (ROUTINE X 2): Culture: NO GROWTH

## 2023-10-29 LAB — CBC
HCT: 21.2 % — ABNORMAL LOW (ref 36.0–46.0)
Hemoglobin: 7 g/dL — ABNORMAL LOW (ref 12.0–15.0)
MCH: 28.3 pg (ref 26.0–34.0)
MCHC: 33 g/dL (ref 30.0–36.0)
MCV: 85.8 fL (ref 80.0–100.0)
Platelets: 200 10*3/uL (ref 150–400)
RBC: 2.47 MIL/uL — ABNORMAL LOW (ref 3.87–5.11)
RDW: 14.9 % (ref 11.5–15.5)
WBC: 9.8 10*3/uL (ref 4.0–10.5)
nRBC: 0 % (ref 0.0–0.2)

## 2023-10-29 LAB — AEROBIC/ANAEROBIC CULTURE W GRAM STAIN (SURGICAL/DEEP WOUND)

## 2023-10-29 LAB — GLUCOSE, CAPILLARY
Glucose-Capillary: 131 mg/dL — ABNORMAL HIGH (ref 70–99)
Glucose-Capillary: 119 mg/dL — ABNORMAL HIGH (ref 70–99)
Glucose-Capillary: 100 mg/dL — ABNORMAL HIGH (ref 70–99)
Glucose-Capillary: 107 mg/dL — ABNORMAL HIGH (ref 70–99)
Glucose-Capillary: 102 mg/dL — ABNORMAL HIGH (ref 70–99)
Glucose-Capillary: 160 mg/dL — ABNORMAL HIGH (ref 70–99)

## 2023-10-29 LAB — MAGNESIUM
Magnesium: 2.2 mg/dL (ref 1.7–2.4)
Magnesium: 2 mg/dL (ref 1.7–2.4)

## 2023-10-29 LAB — PREPARE RBC (CROSSMATCH)

## 2023-10-29 LAB — PHOSPHORUS
Phosphorus: 3 mg/dL (ref 2.5–4.6)
Phosphorus: 1.9 mg/dL — ABNORMAL LOW (ref 2.5–4.6)

## 2023-10-29 MED ORDER — PANCRELIPASE (LIP-PROT-AMYL) 10440-39150 UNITS PO TABS
20880.0000 [IU] | ORAL_TABLET | Freq: Once | ORAL | Status: AC
Start: 2023-10-29 — End: 2023-10-29
  Administered 2023-10-29: 20880 [IU]
  Filled 2023-10-29: qty 2

## 2023-10-29 MED ORDER — SODIUM BICARBONATE 650 MG PO TABS
650.0000 mg | ORAL_TABLET | Freq: Once | ORAL | Status: AC
Start: 2023-10-29 — End: 2023-10-29
  Administered 2023-10-29: 650 mg
  Filled 2023-10-29: qty 1

## 2023-10-29 MED ORDER — CHLORHEXIDINE GLUCONATE CLOTH 2 % EX PADS
6.0000 | MEDICATED_PAD | Freq: Every day | CUTANEOUS | Status: DC
  Administered 2023-10-30 – 2023-11-05 (×7): 6 via TOPICAL

## 2023-10-29 MED ORDER — SODIUM CHLORIDE 0.9% IV SOLUTION: Freq: Once | INTRAVENOUS | Status: AC

## 2023-10-29 MED ORDER — OSMOLITE 1.5 CAL PO LIQD
1000.0000 mL | ORAL | Status: DC
  Administered 2023-10-29: 1000 mL
  Filled 2023-10-29 (×3): qty 1000

## 2023-10-29 MED ORDER — SODIUM CHLORIDE 0.9% IV SOLUTION: Freq: Once | INTRAVENOUS | Status: DC

## 2023-10-29 MED ORDER — MIDODRINE HCL 5 MG PO TABS
5.0000 mg | ORAL_TABLET | Freq: Three times a day (TID) | ORAL | Status: DC
  Administered 2023-10-29: 5 mg
  Filled 2023-10-29: qty 1

## 2023-10-29 MED ORDER — POLYETHYLENE GLYCOL 3350 17 G PO PACK
17.0000 g | PACK | Freq: Every day | ORAL | Status: DC
  Filled 2023-10-29: qty 1

## 2023-10-29 MED ORDER — IBUPROFEN 200 MG PO TABS
600.0000 mg | ORAL_TABLET | Freq: Once | ORAL | Status: AC | PRN
  Administered 2023-10-29: 600 mg
  Filled 2023-10-29: qty 3

## 2023-10-29 MED ORDER — OSMOLITE 1.5 CAL PO LIQD
1000.0000 mL | ORAL | Status: DC
  Administered 2023-10-29: 1000 mL
  Filled 2023-10-29: qty 1000

## 2023-10-29 MED ORDER — MIDODRINE HCL 5 MG PO TABS
5.0000 mg | ORAL_TABLET | Freq: Three times a day (TID) | ORAL | Status: DC
  Administered 2023-10-30 – 2023-11-01 (×8): 5 mg
  Filled 2023-10-29 (×9): qty 1

## 2023-10-29 MED ORDER — FAMOTIDINE 20 MG PO TABS
20.0000 mg | ORAL_TABLET | Freq: Every day | ORAL | Status: DC
Start: 2023-10-29 — End: 2023-10-30
  Administered 2023-10-29: 20 mg
  Filled 2023-10-29: qty 1

## 2023-10-29 MED ORDER — ENSURE ENLIVE PO LIQD: 237.0000 mL | Freq: Two times a day (BID) | ORAL | Status: DC

## 2023-10-29 MED ORDER — OSMOLITE 1.5 CAL PO LIQD: 1000.0000 mL | ORAL | Status: DC

## 2023-10-29 MED ORDER — IBUPROFEN 200 MG PO TABS: 600.0000 mg | ORAL_TABLET | Freq: Once | ORAL | Status: DC | PRN

## 2023-10-29 NOTE — Progress Notes (Signed)
NAME:  Ashley Galloway, MRN:  [id number], DOB:  November 19, 1950, LOS: 5 ADMISSION DATE:  10/23/2023, CONSULTATION DATE:  10/24/2023 REFERRING MD: Azucena Fallen, MD, CHIEF COMPLAINT: Septic shock  History of Present Illness:  A 73 y.o. female with HTN, dyslipidemia, PVD, COPD, tobacco smoking (quit few months ago), recent Streptococcus intermedius bacteremia, hepatic abscess (percutaneous drain d/c on 10/20/2022), and diverticulitis in Dec 2024 who completed antibiotics few days ago. She presented to ER 10/24/2023 in am with c/o LLQ swelling and erythema, and left thigh pain for few days. She has fever, chills, general malaise and loss of appetite, but denies chest pain, cough, SOB, wheezing, or N/V/D. She reports constipation.  New CT imaging finds new large LLQ abscess with associated diverticulitis as well as fluid/air into abductor muscle concerning for myositis.   Received 5L of LR boluses Started on IV abx: Vancomycin, Cefepime, and Flagyl--->Linezolid, Zosyn and Clindamycin. General surgery consulted and request IR, s/p perc drain to abdominal abscess. Developed Afib in ICU, new   Pertinent  Medical History  HTN, dyslipidemia, PVD, COPD, tobacco smoking (quit few months ago), recent Streptococcus intermedius bacteremia, hepatic abscess (percutaneous drain d/c on 10/20/2022), diverticulitis in Dec 2024   Significant Hospital Events: Including procedures, antibiotic start and stop dates in addition to other pertinent events   1/25 to OR for debridement of necrotizing fasciitis of LLE, IR placed LLQ drain for abscess  1/29 transferred to [facility] ICU in anticipation of repeat I/D on 1/31  Interim History / Subjective:  Arrived to [facility] ICU overnight. She denies acute complaints including pain, fever, chills, N/V. She is eager for her next surgery to start.  Objective   Blood pressure (!) 99/48, pulse 87, temperature (!) 100.8 F (38.2 C), resp. rate (!) 22, height 5\' 5"  (1.651 m), weight  85 kg, SpO2 99%.        Intake/Output Summary (Last 24 hours) at 10/29/2023 0647 Last data filed at 10/29/2023 0600 Gross per 24 hour  Intake 1799.55 ml  Output 4375 ml  Net -2575.45 ml   Filed Weights   10/23/23 2328 10/24/23 1415 10/29/23 0358  Weight: 71.3 kg 75.5 kg 85 kg    Examination: General: NAD resting in bed HENT: NCAT with moist mucous membranes Lungs: CTABL Cardiovascular: RRR no MRG Abdomen: Soft and without distension. JP drain with feculent material. Extremities: L leg wrapped in clean dressing. LEs are perfused and without swelling. Neuro: AOx3  Resolved Hospital Problem list   AKI Acute metabolic encephalopathy  Shock Hypokalemia Hyponatremia  Assessment & Plan:  Sepsis due to intra-abdominal abscess/diverticulitis and left thigh necrotizing fascitis.  S/p CT guided drain placement 1/25 for abdominal abscess - multiorganism S/p excision and debridement of LLE with wound vac placement 1/25 Staph/strep Bacteremia Complicated by recent strep intermedius bacteremia, hepatic abscess (drain was d/c 10/21/2023), and chronic diverticulosis.  - General Surgery following. Considering plan for Hartman's procedure. - Ortho following, plan for OR on Friday at [facility] for further evaluation of LLE - ID following, continue linzeolid, zosyn. Discontinued micafungin 1/28. - continue midodrine 5mg  TID   Anemia Hg 7 this am. Normocytic, progressive over 2 months, likely chronic disease. Will transfuse 1u PRBC. - iron studies in am  Volume overload - continue lasix 20mg  IV this morning - goal net even to -1L   New Afib due to sepsis 10/05/2023: Echo EF 60%, G1DD Optimize electroltes K>4, Mg>2 - back in NSR  COPD - Duoneb PRN   HTN and dyslipidemia - Hold home  meds   Moderate Protein calorie malnutrition - Dietitian consult - protein boost supplement, although poor oral intake and unable to keep up with needs - tube feeds per cortrak   Hypoglycemia Pre-DM  (HbA1c 6.3%) - Continue D10+ 1/2NS - q4hr glucose checks   Elevated INR: recent IV and PO Abx and sepsis now - trend   Anxiety: well-controlled   Lumbar stenosis, no current complaints   Peripheral arterial disease, chronic  Best Practice (right click and "Reselect all SmartList Selections" daily)   Diet/type: clear liquids DVT prophylaxis prophylactic heparin  Pressure ulcer(s): N/A GI prophylaxis: H2B Lines: Central line Foley:  Yes, and it is still needed Code Status:  full code Last date of multidisciplinary goals of care discussion [husband updated at bedside 1/26 am]  Labs   CBC: Recent Labs  Lab 10/23/23 2337 10/24/23 0410 10/25/23 0346 10/26/23 0311 10/27/23 0559 10/28/23 0517 10/29/23 0346  WBC 17.7*   < > 6.6 10.1 12.3* 9.6 9.8  NEUTROABS 14.5*  --   --   --   --   --   --   HGB 14.8   < > 7.8* 7.0* 7.8* 7.3* 7.0*  HCT 45.9   < > 23.7* 20.9* 23.1* 22.8* 21.2*  MCV 86.4   < > 86.5 85.0 85.2 87.0 85.8  PLT 606*   < > 279 245 221 193 200   < > = values in this interval not displayed.    Basic Metabolic Panel: Recent Labs  Lab 10/25/23 0346 10/25/23 2102 10/26/23 0311 10/27/23 0558 10/27/23 0559 10/28/23 0517 10/28/23 1707 10/29/23 0346  NA 132* 136 133*  --  133* 135  --  132*  K 3.0* 3.7 3.1*  --  3.0* 3.8  --  3.8  CL 100 104 102  --  99 100  --  95*  CO2 23 22 23   --  24 25  --  26  GLUCOSE 142* 116* 138*  --  106* 104*  --  137*  BUN 17 12 11   --  12 11  --  5*  CREATININE 0.53 0.52 <0.30*  --  0.51 0.31*  --  0.51  CALCIUM 7.9* 7.9* 7.8*  --  7.6* 8.3*  --  8.1*  MG 1.8  --  2.0 2.1  --   --  1.8 2.2  PHOS 2.0*  --  2.5 2.3*  --   --  1.4* 3.0   GFR: Estimated Creatinine Clearance: 68.4 mL/min (by C-G formula based on SCr of 0.51 mg/dL). Recent Labs  Lab 10/24/23 0410 10/24/23 1323 10/25/23 0346 10/25/23 0918 10/26/23 0311 10/27/23 0559 10/28/23 0517 10/29/23 0346  WBC 11.2*  --  6.6  --  10.1 12.3* 9.6 9.8  LATICACIDVEN 5.9*  3.7* 3.1* 2.6*  --   --   --   --     Liver Function Tests: Recent Labs  Lab 10/23/23 2337 10/24/23 0410  AST 64* 51*  ALT 36 30  ALKPHOS 119 86  BILITOT 1.3* 0.7  PROT 8.0 5.8*  ALBUMIN 2.2* 1.6*   No results for input(s): "LIPASE", "AMYLASE" in the last 168 hours. No results for input(s): "AMMONIA" in the last 168 hours.  ABG    Component Value Date/Time   PHART 7.422 10/24/2023 2225   PCO2ART 33.0 10/24/2023 2225   PO2ART 194 (H) 10/24/2023 2225   HCO3 21.5 10/24/2023 2225   TCO2 22 10/24/2023 2225   ACIDBASEDEF 3.0 (H) 10/24/2023 2225   O2SAT 100 10/24/2023  2225     Coagulation Profile: Recent Labs  Lab 10/23/23 2337  INR 1.6*    Cardiac Enzymes: No results for input(s): "CKTOTAL", "CKMB", "CKMBINDEX", "TROPONINI" in the last 168 hours.  HbA1C: Hgb A1c MFr Bld  Date/Time Value Ref Range Status  10/24/2023 04:10 AM 6.3 (H) 4.8 - 5.6 % Final    Comment:    (NOTE) Pre diabetes:          5.7%-6.4%  Diabetes:              >6.4%  Glycemic control for   <7.0% adults with diabetes     CBG: Recent Labs  Lab 10/28/23 1137 10/28/23 1551 10/28/23 1935 10/28/23 2327 10/29/23 0323  GLUCAP 115* 107* 96 160* 131*    Review of Systems:   Negative per above  Past Medical History:  She,  has a past medical history of Centrilobular emphysema (HCC), Colon polyps, Diverticulitis, HLD (hyperlipidemia), TIA (transient ischemic attack), and Vesicointestinal fistula.   Surgical History:   Past Surgical History:  Procedure Laterality Date   CHOLECYSTECTOMY     I & D EXTREMITY Left 10/24/2023   Procedure: IRRIGATION AND DEBRIDEMENT EXTREMITY Left lower extremity;  Surgeon: Samson Frederic, MD;  Location: [facility];  Service: Orthopedics;  Laterality: Left;  1 and 1/2 large wound sponge as well as a smaller wound sponge placed in upper thigh incision, 1 1/2 large wound sponge placed in lower left leg incision. Staples placed by surgeon in intermittent places to keep  sponges inside and wound v   IR GUIDED DRAIN W CATHETER PLACEMENT  09/16/2023   PARTIAL COLECTOMY     Tubulovillous adenoma   SPINE SURGERY       Social History:   reports that she has been smoking cigarettes. She has been exposed to tobacco smoke. She has never used smokeless tobacco.   Family History:  Her family history is negative for Breast cancer.   Allergies Allergies  Allergen Reactions   Tape Other (See Comments)    Tape from hormone patch.     Home Medications  Prior to Admission medications   Medication Sig Start Date End Date Taking? Authorizing Provider  aspirin EC 81 MG tablet Take 81 mg by mouth daily. Swallow whole.   Yes [provider]  cefadroxil (DURICEF) 500 MG capsule Take 1,000 mg by mouth 2 (two) times daily.   Yes [provider]  KLOR-CON M20 20 MEQ tablet Take 20 mEq by mouth daily.   Yes [provider]  latanoprost (XALATAN) 0.005 % ophthalmic solution Place 1 drop into both eyes at bedtime.   Yes [provider]  lovastatin (MEVACOR) 20 MG tablet Take 20 mg by mouth daily.   Yes [provider]  methocarbamol (ROBAXIN) 500 MG tablet Take 500 mg by mouth daily as needed for muscle spasms. 10/18/23  Yes [provider]  Multiple Vitamins-Minerals (CENTRUM SILVER WOMEN 50+) TABS Take 1 tablet by mouth daily.   Yes [provider]  ondansetron (ZOFRAN-ODT) 4 MG disintegrating tablet Take 1 tablet (4 mg total) by mouth every 8 (eight) hours as needed for nausea or vomiting. 10/19/23  Yes Kuppelweiser, Cassie L, RPH-CPP  Probiotic Product (PROBIOTIC PO) Take 1 capsule by mouth daily.   Yes [provider]  traMADol (ULTRAM) 50 MG tablet Take 50 mg by mouth 2 (two) times daily as needed for moderate pain (pain score 4-6) or severe pain (pain score 7-10). 10/22/23  Yes [provider]  triamterene-hydrochlorothiazide (  DYAZIDE) 37.5-25 MG capsule Take 1 capsule by mouth every morning.    Yes [provider]  ALPRAZolam (XANAX) 0.25 MG tablet Take 0.25 mg by mouth daily. Patient not taking: Reported on 10/24/2023 10/22/23   [provider]  cefTRIAXone (ROCEPHIN) 2 g injection Inject 2 g into the muscle daily. For 5 days starting 10/05/23 Patient not taking: Reported on 10/24/2023    [provider]  HYDROcodone-acetaminophen (NORCO/VICODIN) 5-325 MG tablet Take 1 tablet by mouth every 12 (twelve) hours. Patient not taking: Reported on 10/24/2023 10/23/23   [provider]  predniSONE (STERAPRED UNI-PAK 21 TAB) 10 MG (21) TBPK tablet Take 10 mg by mouth as directed. Patient not taking: Reported on 10/24/2023 10/22/23   [provider]  timolol (TIMOPTIC) 0.5 % ophthalmic solution Place 1 drop into both eyes 2 (two) times daily. Patient not taking: Reported on 10/24/2023 07/08/23   [provider]  traZODone (DESYREL) 50 MG tablet Take 1-2 tablets by mouth at bedtime. Patient not taking: Reported on 10/24/2023 04/23/23   [provider]     Critical care time: 35 mins

## 2023-10-29 NOTE — H&P (View-Only) (Signed)
 ORTHOPAEDIC CONSULTATION  REQUESTING PHYSICIAN: Hunsucker, Lesia Sago, MD  Chief Complaint: Abscess left thigh.  HPI: Ashley Galloway is a 73 y.o. female who presents with abscess left thigh status post initial debridement with Dr. Linna Caprice 5 days ago.  Patient has persistent purulent drainage from the wound VAC.  Past Medical History:  Diagnosis Date   Centrilobular emphysema (HCC)    Colon polyps    Looks like saw Dr. Marca Ancona in 2021   Diverticulitis    HLD (hyperlipidemia)    TIA (transient ischemic attack)    Vesicointestinal fistula    Past Surgical History:  Procedure Laterality Date   CHOLECYSTECTOMY     I & D EXTREMITY Left 10/24/2023   Procedure: IRRIGATION AND DEBRIDEMENT EXTREMITY Left lower extremity;  Surgeon: Samson Frederic, MD;  Location: [facility];  Service: Orthopedics;  Laterality: Left;  1 and 1/2 large wound sponge as well as a smaller wound sponge placed in upper thigh incision, 1 1/2 large wound sponge placed in lower left leg incision. Staples placed by surgeon in intermittent places to keep sponges inside and wound v   IR GUIDED DRAIN W CATHETER PLACEMENT  09/16/2023   PARTIAL COLECTOMY     Tubulovillous adenoma   SPINE SURGERY     Social History   Socioeconomic History   Marital status: Married    Spouse name: Not on file   Number of children: Not on file   Years of education: Not on file   Highest education level: Not on file  Occupational History   Not on file  Tobacco Use   Smoking status: Every Day    Types: Cigarettes    Passive exposure: Current   Smokeless tobacco: Never  Substance and Sexual Activity   Alcohol use: Not on file   Drug use: Not on file   Sexual activity: Not on file  Other Topics Concern   Not on file  Social History Narrative   Not on file   Social Drivers of Health   Financial Resource Strain: Not on file  Food Insecurity: Patient Unable To Answer (10/26/2023)   Hunger Vital Sign    Worried About  Running Out of Food in the Last Year: Patient unable to answer    Ran Out of Food in the Last Year: Patient unable to answer  Transportation Needs: Patient Unable To Answer (10/26/2023)   PRAPARE - Transportation    Lack of Transportation (Medical): Patient unable to answer    Lack of Transportation (Non-Medical): Patient unable to answer  Physical Activity: Not on file  Stress: Not on file  Social Connections: Patient Unable To Answer (10/26/2023)   Social Connection and Isolation Panel [NHANES]    Frequency of Communication with Friends and Family: Patient unable to answer    Frequency of Social Gatherings with Friends and Family: Patient unable to answer    Attends Religious Services: Patient unable to answer    Active Member of Clubs or Organizations: Patient unable to answer    Attends Banker Meetings: Patient unable to answer    Marital Status: Patient unable to answer   Family History  Problem Relation Age of Onset   Breast cancer Neg Hx    - negative except otherwise stated in the family history section Allergies  Allergen Reactions   Tape Other (See Comments)    Tape from hormone patch.   Prior to Admission medications   Medication Sig Start Date End Date Taking? Authorizing Provider  aspirin EC  81 MG tablet Take 81 mg by mouth daily. Swallow whole.   Yes [provider]  cefadroxil (DURICEF) 500 MG capsule Take 1,000 mg by mouth 2 (two) times daily.   Yes [provider]  KLOR-CON M20 20 MEQ tablet Take 20 mEq by mouth daily.   Yes [provider]  latanoprost (XALATAN) 0.005 % ophthalmic solution Place 1 drop into both eyes at bedtime.   Yes [provider]  lovastatin (MEVACOR) 20 MG tablet Take 20 mg by mouth daily.   Yes [provider]  methocarbamol (ROBAXIN) 500 MG tablet Take 500 mg by mouth daily as needed for muscle spasms. 10/18/23  Yes [provider]  Multiple Vitamins-Minerals (CENTRUM SILVER  WOMEN 50+) TABS Take 1 tablet by mouth daily.   Yes [provider]  ondansetron (ZOFRAN-ODT) 4 MG disintegrating tablet Take 1 tablet (4 mg total) by mouth every 8 (eight) hours as needed for nausea or vomiting. 10/19/23  Yes Kuppelweiser, Cassie L, RPH-CPP  Probiotic Product (PROBIOTIC PO) Take 1 capsule by mouth daily.   Yes [provider]  traMADol (ULTRAM) 50 MG tablet Take 50 mg by mouth 2 (two) times daily as needed for moderate pain (pain score 4-6) or severe pain (pain score 7-10). 10/22/23  Yes [provider]  triamterene-hydrochlorothiazide (DYAZIDE) 37.5-25 MG capsule Take 1 capsule by mouth every morning.   Yes [provider]  ALPRAZolam (XANAX) 0.25 MG tablet Take 0.25 mg by mouth daily. Patient not taking: Reported on 10/24/2023 10/22/23   [provider]  cefTRIAXone (ROCEPHIN) 2 g injection Inject 2 g into the muscle daily. For 5 days starting 10/05/23 Patient not taking: Reported on 10/24/2023    [provider]  HYDROcodone-acetaminophen (NORCO/VICODIN) 5-325 MG tablet Take 1 tablet by mouth every 12 (twelve) hours. Patient not taking: Reported on 10/24/2023 10/23/23   [provider]  predniSONE (STERAPRED UNI-PAK 21 TAB) 10 MG (21) TBPK tablet Take 10 mg by mouth as directed. Patient not taking: Reported on 10/24/2023 10/22/23   [provider]  timolol (TIMOPTIC) 0.5 % ophthalmic solution Place 1 drop into both eyes 2 (two) times daily. Patient not taking: Reported on 10/24/2023 07/08/23   [provider]  traZODone (DESYREL) 50 MG tablet Take 1-2 tablets by mouth at bedtime. Patient not taking: Reported on 10/24/2023 04/23/23   [provider]   DG Abd 1 View Result Date: 10/28/2023 CLINICAL DATA:  [id number] Encounter for nasogastric (NG) tube placement [id number] EXAM: ABDOMEN - 1 VIEW COMPARISON:  None Available. FINDINGS: A weighted tip enteric feeding tube is present. The tip of the tube is in the  region of the pylorus. No evidence of bowel obstruction. Surgical clips present in the right upper quadrant. Low lung volumes. IMPRESSION: The weighted tip of the enteric feeding tube is in the region of the pylorus. Electronically Signed   By: Malachy Moan M.D.   On: 10/28/2023 14:50   - pertinent xrays, CT, MRI studies were reviewed and independently interpreted  Positive ROS: All other systems have been reviewed and were otherwise negative with the exception of those mentioned in the HPI and as above.  Physical Exam: General: Alert, no acute distress Psychiatric: Patient is competent for consent with normal mood and affect Lymphatic: No axillary or cervical lymphadenopathy Cardiovascular: No pedal edema Respiratory: No cyanosis, no use of accessory musculature GI: No organomegaly, abdomen is soft and non-tender    Images:  @ENCIMAGES @  Labs:  Lab Results  Component  Value Date   HGBA1C 6.3 (H) 10/24/2023   REPTSTATUS 10/29/2023 FINAL 10/24/2023   GRAMSTAIN  10/24/2023    RARE WBC PRESENT, PREDOMINANTLY PMN FEW GRAM POSITIVE COCCI RARE GRAM NEGATIVE RODS    CULT  10/24/2023    MODERATE ESCHERICHIA COLI MODERATE KLEBSIELLA OXYTOCA MODERATE BACTEROIDES OVATUS BETA LACTAMASE POSITIVE Performed at [facility], [address] [place], [place] [address]    [facility] ESCHERICHIA COLI 10/24/2023   LABORGA KLEBSIELLA OXYTOCA 10/24/2023    Lab Results  Component Value Date   ALBUMIN 1.6 (L) 10/24/2023   ALBUMIN 2.2 (L) 10/23/2023   ALBUMIN 1.6 (L) 09/19/2023        Latest Ref Rng & Units 10/29/2023    3:46 AM 10/28/2023    5:17 AM 10/27/2023    5:59 AM  CBC EXTENDED  WBC 4.0 - 10.5 K/uL 9.8  9.6  12.3   RBC 3.87 - 5.11 MIL/uL 2.47  2.62  2.71   Hemoglobin 12.0 - 15.0 g/dL 7.0  7.3  7.8   HCT 19.1 - 46.0 % 21.2  22.8  23.1   Platelets 150 - 400 K/uL 200  193  221     Neurologic: Patient does not have protective sensation bilateral lower  extremities.   MUSCULOSKELETAL:   Skin: Examination the wound VAC and dressing is covering most of the skin.  There is purulent drainage in the wound VAC canister.  Patient is incontinent with stool.  Hemoglobin 7.0 with a white cell count of 9.8.  Albumin 1.6.  Initial cultures are showing significant gram-negative bacteria including E. coli and Klebsiella  Assessment: Abscess left thigh.  Plan: Plan: Will plan for surgical debridement of the left thigh tomorrow.  Order placed for patient's second unit of packed red blood cells today.  Risk and benefits were discussed including need for additional surgery and determination of muscle viability.  Patient and family state they understand and wish to proceed at this time.  Thank you for the consult and the opportunity to see Ms. Chauncey Reading, MD [facility] [phone] 7:00 PM

## 2023-10-29 NOTE — Consult Note (Signed)
ORTHOPAEDIC CONSULTATION  REQUESTING PHYSICIAN: Hunsucker, Lesia Sago, MD  Chief Complaint: Abscess left thigh.  HPI: Ashley Galloway is a 73 y.o. female who presents with abscess left thigh status post initial debridement with Dr. Linna Caprice 5 days ago.  Patient has persistent purulent drainage from the wound VAC.  Past Medical History:  Diagnosis Date   Centrilobular emphysema (HCC)    Colon polyps    Looks like saw Dr. Marca Ancona in 2021   Diverticulitis    HLD (hyperlipidemia)    TIA (transient ischemic attack)    Vesicointestinal fistula    Past Surgical History:  Procedure Laterality Date   CHOLECYSTECTOMY     I & D EXTREMITY Left 10/24/2023   Procedure: IRRIGATION AND DEBRIDEMENT EXTREMITY Left lower extremity;  Surgeon: Samson Frederic, MD;  Location: [facility];  Service: Orthopedics;  Laterality: Left;  1 and 1/2 large wound sponge as well as a smaller wound sponge placed in upper thigh incision, 1 1/2 large wound sponge placed in lower left leg incision. Staples placed by surgeon in intermittent places to keep sponges inside and wound v   IR GUIDED DRAIN W CATHETER PLACEMENT  09/16/2023   PARTIAL COLECTOMY     Tubulovillous adenoma   SPINE SURGERY     Social History   Socioeconomic History   Marital status: Married    Spouse name: Not on file   Number of children: Not on file   Years of education: Not on file   Highest education level: Not on file  Occupational History   Not on file  Tobacco Use   Smoking status: Every Day    Types: Cigarettes    Passive exposure: Current   Smokeless tobacco: Never  Substance and Sexual Activity   Alcohol use: Not on file   Drug use: Not on file   Sexual activity: Not on file  Other Topics Concern   Not on file  Social History Narrative   Not on file   Social Drivers of Health   Financial Resource Strain: Not on file  Food Insecurity: Patient Unable To Answer (10/26/2023)   Hunger Vital Sign    Worried About  Running Out of Food in the Last Year: Patient unable to answer    Ran Out of Food in the Last Year: Patient unable to answer  Transportation Needs: Patient Unable To Answer (10/26/2023)   PRAPARE - Transportation    Lack of Transportation (Medical): Patient unable to answer    Lack of Transportation (Non-Medical): Patient unable to answer  Physical Activity: Not on file  Stress: Not on file  Social Connections: Patient Unable To Answer (10/26/2023)   Social Connection and Isolation Panel [NHANES]    Frequency of Communication with Friends and Family: Patient unable to answer    Frequency of Social Gatherings with Friends and Family: Patient unable to answer    Attends Religious Services: Patient unable to answer    Active Member of Clubs or Organizations: Patient unable to answer    Attends Banker Meetings: Patient unable to answer    Marital Status: Patient unable to answer   Family History  Problem Relation Age of Onset   Breast cancer Neg Hx    - negative except otherwise stated in the family history section Allergies  Allergen Reactions   Tape Other (See Comments)    Tape from hormone patch.   Prior to Admission medications   Medication Sig Start Date End Date Taking? Authorizing Provider  aspirin EC  81 MG tablet Take 81 mg by mouth daily. Swallow whole.   Yes [provider]  cefadroxil (DURICEF) 500 MG capsule Take 1,000 mg by mouth 2 (two) times daily.   Yes [provider]  KLOR-CON M20 20 MEQ tablet Take 20 mEq by mouth daily.   Yes [provider]  latanoprost (XALATAN) 0.005 % ophthalmic solution Place 1 drop into both eyes at bedtime.   Yes [provider]  lovastatin (MEVACOR) 20 MG tablet Take 20 mg by mouth daily.   Yes [provider]  methocarbamol (ROBAXIN) 500 MG tablet Take 500 mg by mouth daily as needed for muscle spasms. 10/18/23  Yes [provider]  Multiple Vitamins-Minerals (CENTRUM SILVER  WOMEN 50+) TABS Take 1 tablet by mouth daily.   Yes [provider]  ondansetron (ZOFRAN-ODT) 4 MG disintegrating tablet Take 1 tablet (4 mg total) by mouth every 8 (eight) hours as needed for nausea or vomiting. 10/19/23  Yes Kuppelweiser, Cassie L, RPH-CPP  Probiotic Product (PROBIOTIC PO) Take 1 capsule by mouth daily.   Yes [provider]  traMADol (ULTRAM) 50 MG tablet Take 50 mg by mouth 2 (two) times daily as needed for moderate pain (pain score 4-6) or severe pain (pain score 7-10). 10/22/23  Yes [provider]  triamterene-hydrochlorothiazide (DYAZIDE) 37.5-25 MG capsule Take 1 capsule by mouth every morning.   Yes [provider]  ALPRAZolam (XANAX) 0.25 MG tablet Take 0.25 mg by mouth daily. Patient not taking: Reported on 10/24/2023 10/22/23   [provider]  cefTRIAXone (ROCEPHIN) 2 g injection Inject 2 g into the muscle daily. For 5 days starting 10/05/23 Patient not taking: Reported on 10/24/2023    [provider]  HYDROcodone-acetaminophen (NORCO/VICODIN) 5-325 MG tablet Take 1 tablet by mouth every 12 (twelve) hours. Patient not taking: Reported on 10/24/2023 10/23/23   [provider]  predniSONE (STERAPRED UNI-PAK 21 TAB) 10 MG (21) TBPK tablet Take 10 mg by mouth as directed. Patient not taking: Reported on 10/24/2023 10/22/23   [provider]  timolol (TIMOPTIC) 0.5 % ophthalmic solution Place 1 drop into both eyes 2 (two) times daily. Patient not taking: Reported on 10/24/2023 07/08/23   [provider]  traZODone (DESYREL) 50 MG tablet Take 1-2 tablets by mouth at bedtime. Patient not taking: Reported on 10/24/2023 04/23/23   [provider]   DG Abd 1 View Result Date: 10/28/2023 CLINICAL DATA:  [id number] Encounter for nasogastric (NG) tube placement [id number] EXAM: ABDOMEN - 1 VIEW COMPARISON:  None Available. FINDINGS: A weighted tip enteric feeding tube is present. The tip of the tube is in the  region of the pylorus. No evidence of bowel obstruction. Surgical clips present in the right upper quadrant. Low lung volumes. IMPRESSION: The weighted tip of the enteric feeding tube is in the region of the pylorus. Electronically Signed   By: Malachy Moan M.D.   On: 10/28/2023 14:50   - pertinent xrays, CT, MRI studies were reviewed and independently interpreted  Positive ROS: All other systems have been reviewed and were otherwise negative with the exception of those mentioned in the HPI and as above.  Physical Exam: General: Alert, no acute distress Psychiatric: Patient is competent for consent with normal mood and affect Lymphatic: No axillary or cervical lymphadenopathy Cardiovascular: No pedal edema Respiratory: No cyanosis, no use of accessory musculature GI: No organomegaly, abdomen is soft and non-tender    Images:  @ENCIMAGES @  Labs:  Lab Results  Component  Value Date   HGBA1C 6.3 (H) 10/24/2023   REPTSTATUS 10/29/2023 FINAL 10/24/2023   GRAMSTAIN  10/24/2023    RARE WBC PRESENT, PREDOMINANTLY PMN FEW GRAM POSITIVE COCCI RARE GRAM NEGATIVE RODS    CULT  10/24/2023    MODERATE ESCHERICHIA COLI MODERATE KLEBSIELLA OXYTOCA MODERATE BACTEROIDES OVATUS BETA LACTAMASE POSITIVE Performed at [facility], [address] [place], [place] [address]    [facility] ESCHERICHIA COLI 10/24/2023   LABORGA KLEBSIELLA OXYTOCA 10/24/2023    Lab Results  Component Value Date   ALBUMIN 1.6 (L) 10/24/2023   ALBUMIN 2.2 (L) 10/23/2023   ALBUMIN 1.6 (L) 09/19/2023        Latest Ref Rng & Units 10/29/2023    3:46 AM 10/28/2023    5:17 AM 10/27/2023    5:59 AM  CBC EXTENDED  WBC 4.0 - 10.5 K/uL 9.8  9.6  12.3   RBC 3.87 - 5.11 MIL/uL 2.47  2.62  2.71   Hemoglobin 12.0 - 15.0 g/dL 7.0  7.3  7.8   HCT 19.1 - 46.0 % 21.2  22.8  23.1   Platelets 150 - 400 K/uL 200  193  221     Neurologic: Patient does not have protective sensation bilateral lower  extremities.   MUSCULOSKELETAL:   Skin: Examination the wound VAC and dressing is covering most of the skin.  There is purulent drainage in the wound VAC canister.  Patient is incontinent with stool.  Hemoglobin 7.0 with a white cell count of 9.8.  Albumin 1.6.  Initial cultures are showing significant gram-negative bacteria including E. coli and Klebsiella  Assessment: Abscess left thigh.  Plan: Plan: Will plan for surgical debridement of the left thigh tomorrow.  Order placed for patient's second unit of packed red blood cells today.  Risk and benefits were discussed including need for additional surgery and determination of muscle viability.  Patient and family state they understand and wish to proceed at this time.  Thank you for the consult and the opportunity to see Ms. Chauncey Reading, MD [facility] [phone] 7:00 PM

## 2023-10-29 NOTE — Progress Notes (Signed)
Brief Nutrition Support Note  Pt transferred to ICU from Ashley Galloway for Orthopedic surgery availability. Noted that pt was started on trickle feeds prior to transfer and appears to be tolerating well this AM. Discussed with CCM who deferred to surgery for feeding recommendations. Surgery team ok to advance.  Discussed in rounds, pt to return to OR tomorrow. Wound vac with 1.35 L x 24 hours. Drain also in place to the LLQ which does not have output recorded but surgery noted feculent appearing output.   Recommend the following TF: Osmolite 1.5 at 55 ml/h (1320 ml per day) Advance to 35 and then continue increasing by 10mL Q12H Prosource TF20 60 ml BID Provides 2140 kcal, 123 gm protein, 1006 ml free water daily   Greig Castilla, RD, LDN Registered Dietitian II Please reach out via secure chat Weekend on-call pager # available in [facility]

## 2023-10-29 NOTE — Progress Notes (Signed)
5 Days Post-Op   Subjective/Chief Complaint: Patient conversant this AM. Denies pain. Still with low grade fevers.  Objective: Vital signs in last 24 hours: Temp:  [99.1 F (37.3 C)-101.3 F (38.5 C)] 100.2 F (37.9 C) (01/30 0630) Pulse Rate:  [80-105] 86 (01/30 0630) Resp:  [14-27] 22 (01/30 0630) BP: (98-133)/(47-66) 98/50 (01/30 0630) SpO2:  [93 %-100 %] 99 % (01/30 0630) Weight:  [85 kg] 85 kg (01/30 0358) Last BM Date :  (PTA)  Intake/Output from previous day: 01/29 0701 - 01/30 0700 In: 1799.6 [NG/GT:180.7; IV Piggyback:1613.9] Out: 4375 [Urine:3025; Drains:1350] Intake/Output this shift: No intake/output data recorded.  PE: General: pleasant, WD, female who is laying in bed in NAD. somnolent Lungs: Respiratory effort nonlabored Abd: soft, ND, drain LLQ with brown, feculent output - output not recorded for past 24h . MSK: LLE with upper thigh wrapped in ace   Lab Results:  Recent Labs    10/28/23 0517 10/29/23 0346  WBC 9.6 9.8  HGB 7.3* 7.0*  HCT 22.8* 21.2*  PLT 193 200   BMET Recent Labs    10/28/23 0517 10/29/23 0346  NA 135 132*  K 3.8 3.8  CL 100 95*  CO2 25 26  GLUCOSE 104* 137*  BUN 11 5*  CREATININE 0.31* 0.51  CALCIUM 8.3* 8.1*   PT/INR No results for input(s): "LABPROT", "INR" in the last 72 hours.  ABG No results for input(s): "PHART", "HCO3" in the last 72 hours.  Invalid input(s): "PCO2", "PO2"   Studies/Results: DG Abd 1 View Result Date: 10/28/2023 CLINICAL DATA:  [id number] Encounter for nasogastric (NG) tube placement [id number] EXAM: ABDOMEN - 1 VIEW COMPARISON:  None Available. FINDINGS: A weighted tip enteric feeding tube is present. The tip of the tube is in the region of the pylorus. No evidence of bowel obstruction. Surgical clips present in the right upper quadrant. Low lung volumes. IMPRESSION: The weighted tip of the enteric feeding tube is in the region of the pylorus. Electronically Signed   By: Malachy Moan M.D.   On:  10/28/2023 14:50     Anti-infectives: Anti-infectives (From admission, onward)    Start     Dose/Rate Route Frequency Ordered Stop   10/26/23 0400  vancomycin (VANCOREADY) IVPB 1500 mg/300 mL  Status:  Discontinued        1,500 mg 150 mL/hr over 120 Minutes Intravenous Every 24 hours 10/25/23 0725 10/26/23 0754   10/26/23 0002  ceFAZolin (ANCEF) IVPB 2g/100 mL premix        2 g 200 mL/hr over 30 Minutes Intravenous Every 6 hours 10/26/23 0002 10/26/23 0650   10/25/23 2200  linezolid (ZYVOX) IVPB 600 mg        600 mg 300 mL/hr over 60 Minutes Intravenous Every 12 hours 10/25/23 1634     10/25/23 1800  micafungin (MYCAMINE) 100 mg in sodium chloride 0.9 % 100 mL IVPB  Status:  Discontinued        100 mg 105 mL/hr over 1 Hours Intravenous Every 24 hours 10/25/23 1645 10/27/23 1332   10/25/23 0215  vancomycin (VANCOREADY) IVPB 1500 mg/300 mL        1,500 mg 150 mL/hr over 120 Minutes Intravenous  Once 10/25/23 0121 10/25/23 0358   10/24/23 1000  linezolid (ZYVOX) IVPB 600 mg  Status:  Discontinued        600 mg 300 mL/hr over 60 Minutes Intravenous Every 12 hours 10/24/23 0224 10/24/23 2026   10/24/23 0800  piperacillin-tazobactam (ZOSYN) IVPB 3.375 g  3.375 g 12.5 mL/hr over 240 Minutes Intravenous Every 8 hours 10/24/23 0231     10/24/23 0600  clindamycin (CLEOCIN) IVPB 600 mg  Status:  Discontinued        600 mg 100 mL/hr over 30 Minutes Intravenous Every 8 hours 10/24/23 0224 10/25/23 1634   10/24/23 0230  piperacillin-tazobactam (ZOSYN) IVPB 3.375 g  Status:  Discontinued       Placed in "And" Linked Group   3.375 g 100 mL/hr over 30 Minutes Intravenous  Once 10/24/23 0224 10/24/23 0229   10/23/23 2345  ceFEPIme (MAXIPIME) 2 g in sodium chloride 0.9 % 100 mL IVPB        2 g 200 mL/hr over 30 Minutes Intravenous  Once 10/23/23 2337 10/24/23 0035   10/23/23 2345  metroNIDAZOLE (FLAGYL) IVPB 500 mg        500 mg 100 mL/hr over 60 Minutes Intravenous  Once 10/23/23 2337  10/24/23 0127   10/23/23 2345  vancomycin (VANCOCIN) IVPB 1000 mg/200 mL premix        1,000 mg 200 mL/hr over 60 Minutes Intravenous  Once 10/23/23 2337 10/24/23 0313       Assessment/Plan: 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 Linna Caprice 1/25  S/p IR drain of LLQ abd abscess 1/25  - tmax 100.59F and WBC normal 9.8 this am , continue to follow and monitor - Drain functioning - abdominal exam seems overall stable and abdomen soft. - cultures from debridement with E. Coli, Klebsiella oxytoca - IR drain cx with Klebsiella oxytoca, resistance to amp, otherwise sensitive. - Monitor drain and continue IV abx per ID (changed to piptzo/linezolid/micafungin 1/26) - Agree with cortrak and Tfs for nutrition. -controlled with drain for now, but need to consider next steps for patient as her drain appears most consistent with a fistula and clearly her diverticulitis is the etiology of current situation. - Ortho to take to OR for LLE debridement. From notes appears to be Friday.  - Low grade fevers may be related to abdomen versus LLE. Will tentatively plan for repeat CT scan tomorrow if no significant improvement.  FEN: FLD, TFs ID: zosyn, micafungan, zyvox VTE: hep subq   LOS: 5 days   I reviewed Consultant IR, ortho, ID notes, hospitalist notes, last 24 h vitals and pain scores, last 48 h intake and output, last 24 h labs and trends, and last 24 h imaging results.  This required moderate level of medical decision making.   Lysle Rubens, MD [facility] 10/29/2023, 8:19 AM Please see Amion for pager number during day hours 7:00am-4:30pm

## 2023-10-30 ENCOUNTER — Inpatient Hospital Stay (HOSPITAL_COMMUNITY): Payer: Medicare HMO | Admitting: Anesthesiology

## 2023-10-30 ENCOUNTER — Encounter (HOSPITAL_COMMUNITY)
Admission: EM | Disposition: A | Discharge status: Hospice | Payer: Self-pay | Source: Home / Self Care | Attending: Pulmonary Disease

## 2023-10-30 ENCOUNTER — Other Ambulatory Visit: Payer: Self-pay

## 2023-10-30 ENCOUNTER — Encounter (HOSPITAL_COMMUNITY): Payer: Self-pay | Admitting: Family Medicine

## 2023-10-30 DIAGNOSIS — J449 Chronic obstructive pulmonary disease, unspecified: Secondary | ICD-10-CM

## 2023-10-30 DIAGNOSIS — M726 Necrotizing fasciitis: Secondary | ICD-10-CM

## 2023-10-30 DIAGNOSIS — B9562 Methicillin resistant Staphylococcus aureus infection as the cause of diseases classified elsewhere: Secondary | ICD-10-CM

## 2023-10-30 DIAGNOSIS — R7881 Bacteremia: Secondary | ICD-10-CM

## 2023-10-30 DIAGNOSIS — B965 Pseudomonas (aeruginosa) (mallei) (pseudomallei) as the cause of diseases classified elsewhere: Secondary | ICD-10-CM | POA: Diagnosis not present

## 2023-10-30 DIAGNOSIS — A480 Gas gangrene: Secondary | ICD-10-CM | POA: Diagnosis not present

## 2023-10-30 DIAGNOSIS — R509 Fever, unspecified: Secondary | ICD-10-CM | POA: Diagnosis not present

## 2023-10-30 DIAGNOSIS — K5720 Diverticulitis of large intestine with perforation and abscess without bleeding: Secondary | ICD-10-CM | POA: Diagnosis not present

## 2023-10-30 DIAGNOSIS — I739 Peripheral vascular disease, unspecified: Secondary | ICD-10-CM

## 2023-10-30 DIAGNOSIS — R7303 Prediabetes: Secondary | ICD-10-CM | POA: Diagnosis not present

## 2023-10-30 DIAGNOSIS — E440 Moderate protein-calorie malnutrition: Secondary | ICD-10-CM | POA: Diagnosis not present

## 2023-10-30 DIAGNOSIS — K630 Abscess of intestine: Secondary | ICD-10-CM | POA: Diagnosis not present

## 2023-10-30 HISTORY — PX: INCISION AND DRAINAGE OF WOUND: SHX1803

## 2023-10-30 LAB — POCT I-STAT 7, (LYTES, BLD GAS, ICA,H+H)
Acid-base deficit: 5 mmol/L — ABNORMAL HIGH (ref 0.0–2.0)
Acid-base deficit: 4 mmol/L — ABNORMAL HIGH (ref 0.0–2.0)
Bicarbonate: 20.5 mmol/L (ref 20.0–28.0)
Bicarbonate: 21.2 mmol/L (ref 20.0–28.0)
Calcium, Ion: 1.04 mmol/L — ABNORMAL LOW (ref 1.15–1.40)
Calcium, Ion: 1.12 mmol/L — ABNORMAL LOW (ref 1.15–1.40)
HCT: 34 % — ABNORMAL LOW (ref 36.0–46.0)
HCT: 29 % — ABNORMAL LOW (ref 36.0–46.0)
Hemoglobin: 11.6 g/dL — ABNORMAL LOW (ref 12.0–15.0)
Hemoglobin: 9.9 g/dL — ABNORMAL LOW (ref 12.0–15.0)
O2 Saturation: 100 %
O2 Saturation: 100 %
Patient temperature: 101.2
Potassium: 3.9 mmol/L (ref 3.5–5.1)
Potassium: 3.5 mmol/L (ref 3.5–5.1)
Sodium: 135 mmol/L (ref 135–145)
Sodium: 135 mmol/L (ref 135–145)
TCO2: 22 mmol/L (ref 22–32)
TCO2: 22 mmol/L (ref 22–32)
pCO2 arterial: 39.1 mm[Hg] (ref 32–48)
pCO2 arterial: 39 mm[Hg] (ref 32–48)
pH, Arterial: 7.327 — ABNORMAL LOW (ref 7.35–7.45)
pH, Arterial: 7.349 — ABNORMAL LOW (ref 7.35–7.45)
pO2, Arterial: 319 mm[Hg] — ABNORMAL HIGH (ref 83–108)
pO2, Arterial: 197 mm[Hg] — ABNORMAL HIGH (ref 83–108)

## 2023-10-30 LAB — PREPARE RBC (CROSSMATCH)

## 2023-10-30 LAB — BASIC METABOLIC PANEL
Anion gap: 14 (ref 5–15)
BUN: 12 mg/dL (ref 8–23)
CO2: 21 mmol/L — ABNORMAL LOW (ref 22–32)
Calcium: 8.1 mg/dL — ABNORMAL LOW (ref 8.9–10.3)
Chloride: 100 mmol/L (ref 98–111)
Creatinine, Ser: 0.57 mg/dL (ref 0.44–1.00)
GFR, Estimated: >60 mL/min (ref >60)
Glucose, Bld: 100 mg/dL — ABNORMAL HIGH (ref 70–99)
Potassium: 3.6 mmol/L (ref 3.5–5.1)
Sodium: 135 mmol/L (ref 135–145)

## 2023-10-30 LAB — MAGNESIUM: Magnesium: 2 mg/dL (ref 1.7–2.4)

## 2023-10-30 LAB — GLUCOSE, CAPILLARY
Glucose-Capillary: 105 mg/dL — ABNORMAL HIGH (ref 70–99)
Glucose-Capillary: 113 mg/dL — ABNORMAL HIGH (ref 70–99)
Glucose-Capillary: 116 mg/dL — ABNORMAL HIGH (ref 70–99)
Glucose-Capillary: 121 mg/dL — ABNORMAL HIGH (ref 70–99)
Glucose-Capillary: 125 mg/dL — ABNORMAL HIGH (ref 70–99)
Glucose-Capillary: 157 mg/dL — ABNORMAL HIGH (ref 70–99)

## 2023-10-30 LAB — CBC
HCT: 26.3 % — ABNORMAL LOW (ref 36.0–46.0)
Hemoglobin: 8.9 g/dL — ABNORMAL LOW (ref 12.0–15.0)
MCH: 28.5 pg (ref 26.0–34.0)
MCHC: 33.8 g/dL (ref 30.0–36.0)
MCV: 84.3 fL (ref 80.0–100.0)
Platelets: 200 10*3/uL (ref 150–400)
RBC: 3.12 MIL/uL — ABNORMAL LOW (ref 3.87–5.11)
RDW: 15 % (ref 11.5–15.5)
WBC: 6.7 10*3/uL (ref 4.0–10.5)
nRBC: 0.3 % — ABNORMAL HIGH (ref 0.0–0.2)

## 2023-10-30 LAB — IRON AND TIBC: Iron: 12 ug/dL — ABNORMAL LOW (ref 28–170)

## 2023-10-30 LAB — FERRITIN: Ferritin: 524 ng/mL — ABNORMAL HIGH (ref 11–307)

## 2023-10-30 LAB — PHOSPHORUS: Phosphorus: 1.8 mg/dL — ABNORMAL LOW (ref 2.5–4.6)

## 2023-10-30 SURGERY — IRRIGATION AND DEBRIDEMENT WOUND
Anesthesia: General | Laterality: Left

## 2023-10-30 MED ORDER — ROCURONIUM BROMIDE 10 MG/ML (PF) SYRINGE
PREFILLED_SYRINGE | INTRAVENOUS | Status: AC
  Filled 2023-10-30: qty 10

## 2023-10-30 MED ORDER — SODIUM CHLORIDE 0.9% IV SOLUTION
Freq: Once | INTRAVENOUS | Status: AC
Start: 2023-10-30 — End: 2023-10-30

## 2023-10-30 MED ORDER — FENTANYL 2500MCG IN NS 250ML (10MCG/ML) PREMIX INFUSION
50.0000 ug/h | INTRAVENOUS | Status: DC
  Administered 2023-10-30: 50 ug/h via INTRAVENOUS
  Administered 2023-10-31 – 2023-11-01 (×2): 125 ug/h via INTRAVENOUS
  Filled 2023-10-30 (×3): qty 250

## 2023-10-30 MED ORDER — IBUPROFEN 100 MG/5ML PO SUSP
400.0000 mg | Freq: Four times a day (QID) | ORAL | Status: DC | PRN
  Administered 2023-10-30: 400 mg
  Filled 2023-10-30 (×2): qty 20

## 2023-10-30 MED ORDER — FAMOTIDINE 20 MG PO TABS
20.0000 mg | ORAL_TABLET | Freq: Two times a day (BID) | ORAL | Status: DC
  Administered 2023-10-30 – 2023-11-01 (×6): 20 mg
  Filled 2023-10-30 (×7): qty 1

## 2023-10-30 MED ORDER — IBUPROFEN 100 MG/5ML PO SUSP: 400.0000 mg | Freq: Four times a day (QID) | ORAL | Status: DC | PRN

## 2023-10-30 MED ORDER — PHENYLEPHRINE HCL-NACL 20-0.9 MG/250ML-% IV SOLN
0.0000 ug/min | INTRAVENOUS | Status: DC
  Administered 2023-10-30: 65 ug/min via INTRAVENOUS
  Administered 2023-10-30: 85 ug/min via INTRAVENOUS
  Administered 2023-10-31: 10 ug/min via INTRAVENOUS
  Administered 2023-10-31: 65 ug/min via INTRAVENOUS
  Administered 2023-10-31: 80 ug/min via INTRAVENOUS
  Administered 2023-11-01: 50 ug/min via INTRAVENOUS
  Administered 2023-11-01: 35 ug/min via INTRAVENOUS
  Filled 2023-10-30 (×6): qty 250

## 2023-10-30 MED ORDER — CHLORHEXIDINE GLUCONATE 4 % EX SOLN
60.0000 mL | Freq: Once | CUTANEOUS | Status: DC
  Filled 2023-10-30: qty 60

## 2023-10-30 MED ORDER — 0.9 % SODIUM CHLORIDE (POUR BTL) OPTIME
TOPICAL | Status: DC | PRN
  Administered 2023-10-30: 1000 mL

## 2023-10-30 MED ORDER — PROPOFOL 1000 MG/100ML IV EMUL
5.0000 ug/kg/min | INTRAVENOUS | Status: DC
  Administered 2023-10-30: 70 ug/kg/min via INTRAVENOUS
  Administered 2023-10-30 (×2): 50 ug/kg/min via INTRAVENOUS
  Administered 2023-10-31: 30 ug/kg/min via INTRAVENOUS
  Administered 2023-10-31: 50 ug/kg/min via INTRAVENOUS
  Filled 2023-10-30 (×2): qty 100
  Filled 2023-10-30: qty 200
  Filled 2023-10-30: qty 100

## 2023-10-30 MED ORDER — CHLORHEXIDINE GLUCONATE 0.12 % MT SOLN
15.0000 mL | Freq: Once | OROMUCOSAL | Status: AC
Start: 2023-10-30 — End: 2023-10-30
  Administered 2023-10-30: 15 mL via OROMUCOSAL

## 2023-10-30 MED ORDER — PHENYLEPHRINE 80 MCG/ML (10ML) SYRINGE FOR IV PUSH (FOR BLOOD PRESSURE SUPPORT)
PREFILLED_SYRINGE | INTRAVENOUS | Status: AC
  Filled 2023-10-30: qty 10

## 2023-10-30 MED ORDER — ROCURONIUM BROMIDE 10 MG/ML (PF) SYRINGE
PREFILLED_SYRINGE | INTRAVENOUS | Status: DC | PRN
  Administered 2023-10-30: 20 mg via INTRAVENOUS
  Administered 2023-10-30: 50 mg via INTRAVENOUS
  Administered 2023-10-30: 20 mg via INTRAVENOUS
  Administered 2023-10-30: 60 mg via INTRAVENOUS

## 2023-10-30 MED ORDER — ALBUMIN HUMAN 5 % IV SOLN: INTRAVENOUS | Status: DC | PRN

## 2023-10-30 MED ORDER — ORAL CARE MOUTH RINSE: 15.0000 mL | Freq: Once | OROMUCOSAL | Status: AC

## 2023-10-30 MED ORDER — PROPOFOL 500 MG/50ML IV EMUL
INTRAVENOUS | Status: DC | PRN
  Administered 2023-10-30: 80 ug/kg/min via INTRAVENOUS

## 2023-10-30 MED ORDER — STERILE WATER FOR IRRIGATION IR SOLN
Status: DC | PRN
  Administered 2023-10-30: 1000 mL

## 2023-10-30 MED ORDER — PHENYLEPHRINE 80 MCG/ML (10ML) SYRINGE FOR IV PUSH (FOR BLOOD PRESSURE SUPPORT)
PREFILLED_SYRINGE | INTRAVENOUS | Status: DC | PRN
  Administered 2023-10-30: 160 ug via INTRAVENOUS
  Administered 2023-10-30 (×2): 80 ug via INTRAVENOUS

## 2023-10-30 MED ORDER — PHENYLEPHRINE HCL-NACL 20-0.9 MG/250ML-% IV SOLN
INTRAVENOUS | Status: DC | PRN
  Administered 2023-10-30: 40 ug/min via INTRAVENOUS

## 2023-10-30 MED ORDER — IOHEXOL 9 MG/ML PO SOLN: 500.0000 mL | ORAL | Status: DC

## 2023-10-30 MED ORDER — PROPOFOL 10 MG/ML IV BOLUS
INTRAVENOUS | Status: DC | PRN
  Administered 2023-10-30: 100 mg via INTRAVENOUS

## 2023-10-30 MED ORDER — LACTATED RINGERS IV BOLUS
1000.0000 mL | Freq: Once | INTRAVENOUS | Status: AC
  Administered 2023-10-30: 1000 mL via INTRAVENOUS

## 2023-10-30 MED ORDER — POTASSIUM PHOSPHATES 15 MMOLE/5ML IV SOLN
15.0000 mmol | Freq: Once | INTRAVENOUS | Status: AC
  Administered 2023-10-30: 15 mmol via INTRAVENOUS
  Filled 2023-10-30: qty 5

## 2023-10-30 MED ORDER — FENTANYL CITRATE (PF) 250 MCG/5ML IJ SOLN
INTRAMUSCULAR | Status: AC
  Filled 2023-10-30: qty 5

## 2023-10-30 MED ORDER — POVIDONE-IODINE 10 % EX SWAB: 2.0000 | Freq: Once | CUTANEOUS | Status: DC

## 2023-10-30 MED ORDER — FENTANYL CITRATE (PF) 250 MCG/5ML IJ SOLN
INTRAMUSCULAR | Status: DC | PRN
  Administered 2023-10-30 (×5): 50 ug via INTRAVENOUS

## 2023-10-30 MED ORDER — PROPOFOL 10 MG/ML IV BOLUS
INTRAVENOUS | Status: AC
  Filled 2023-10-30: qty 20

## 2023-10-30 MED ORDER — LACTATED RINGERS IV SOLN: INTRAVENOUS | Status: DC | PRN

## 2023-10-30 MED ORDER — LACTATED RINGERS IV SOLN: INTRAVENOUS | Status: DC

## 2023-10-30 MED ORDER — VASHE WOUND IRRIGATION OPTIME
TOPICAL | Status: DC | PRN
  Administered 2023-10-30 (×4): 34 [oz_av]

## 2023-10-30 SURGICAL SUPPLY — 34 items
BAG COUNTER SPONGE SURGICOUNT (BAG) IMPLANT
BLADE SURG 21 STRL SS (BLADE) ×1 IMPLANT
BNDG COHESIVE 6X5 TAN ST LF (GAUZE/BANDAGES/DRESSINGS) IMPLANT
BNDG GAUZE DERMACEA FLUFF 4 (GAUZE/BANDAGES/DRESSINGS) ×2 IMPLANT
CLEANSER WND VASHE INSTL 34OZ (WOUND CARE) IMPLANT
COVER SURGICAL LIGHT HANDLE (MISCELLANEOUS) ×2 IMPLANT
DRAPE INCISE IOBAN 66X45 STRL (DRAPES) IMPLANT
DRAPE U-SHAPE 47X51 STRL (DRAPES) ×1 IMPLANT
DRSG ADAPTIC 3X8 NADH LF (GAUZE/BANDAGES/DRESSINGS) ×1 IMPLANT
DURAPREP 26ML APPLICATOR (WOUND CARE) ×1 IMPLANT
ELECT REM PT RETURN 9FT ADLT (ELECTROSURGICAL) ×1
ELECTRODE REM PT RTRN 9FT ADLT (ELECTROSURGICAL) IMPLANT
GAUZE PAD ABD 8X10 STRL (GAUZE/BANDAGES/DRESSINGS) IMPLANT
GAUZE SPONGE 4X4 12PLY STRL (GAUZE/BANDAGES/DRESSINGS) ×1 IMPLANT
GLOVE BIOGEL PI IND STRL 9 (GLOVE) ×1 IMPLANT
GLOVE SURG ORTHO 9.0 STRL STRW (GLOVE) ×1 IMPLANT
GOWN STRL REUS W/ TWL XL LVL3 (GOWN DISPOSABLE) ×2 IMPLANT
GRAFT SKIN WND SURGICLOSE M95 (Tissue) IMPLANT
KIT BASIN OR (CUSTOM PROCEDURE TRAY) ×1 IMPLANT
KIT TURNOVER KIT B (KITS) ×1 IMPLANT
MANIFOLD NEPTUNE II (INSTRUMENTS) ×1 IMPLANT
NS IRRIG 1000ML POUR BTL (IV SOLUTION) ×1 IMPLANT
PACK ORTHO EXTREMITY (CUSTOM PROCEDURE TRAY) ×1 IMPLANT
PAD ARMBOARD 7.5X6 YLW CONV (MISCELLANEOUS) ×2 IMPLANT
SET HNDPC FAN SPRY TIP SCT (DISPOSABLE) IMPLANT
STOCKINETTE IMPERVIOUS 9X36 MD (GAUZE/BANDAGES/DRESSINGS) IMPLANT
SUT ETHILON 2 0 PSLX (SUTURE) ×1 IMPLANT
SUT SILK 2 0 TIES 10X30 (SUTURE) IMPLANT
SWAB COLLECTION DEVICE MRSA (MISCELLANEOUS) ×1 IMPLANT
SWAB CULTURE ESWAB REG 1ML (MISCELLANEOUS) IMPLANT
TOWEL GREEN STERILE (TOWEL DISPOSABLE) ×1 IMPLANT
TUBE CONNECTING 12X1/4 (SUCTIONS) ×1 IMPLANT
WATER STERILE IRR 1000ML POUR (IV SOLUTION) IMPLANT
YANKAUER SUCT BULB TIP NO VENT (SUCTIONS) ×1 IMPLANT

## 2023-10-30 NOTE — Anesthesia Procedure Notes (Signed)
Procedure Name: Intubation Date/Time: 10/30/2023 12:58 PM  Performed by: Ammie Dalton, CRNAPre-anesthesia Checklist: Patient identified, Emergency Drugs available, Suction available and Patient being monitored Patient Re-evaluated:Patient Re-evaluated prior to induction Oxygen Delivery Method: Circle System Utilized Preoxygenation: Pre-oxygenation with 100% oxygen Induction Type: IV induction Ventilation: Mask ventilation without difficulty Laryngoscope Size: Mac and 3 Grade View: Grade I Tube type: Oral Number of attempts: 1 Airway Equipment and Method: Stylet Placement Confirmation: ETT inserted through vocal cords under direct vision, positive ETCO2 and breath sounds checked- equal and bilateral Secured at: 21 cm Tube secured with: Tape Dental Injury: Teeth and Oropharynx as per pre-operative assessment

## 2023-10-30 NOTE — Anesthesia Preprocedure Evaluation (Addendum)
Anesthesia Evaluation  Patient identified by MRN, date of birth, ID band Patient awake    Reviewed: Allergy & Precautions, H&P , NPO status , Patient's Chart, lab work & pertinent test results  Airway Mallampati: III  TM Distance: >3 FB Neck ROM: Full    Dental no notable dental hx. (+) Teeth Intact, Dental Advisory Given   Pulmonary COPD, Current Smoker   Pulmonary exam normal breath sounds clear to auscultation       Cardiovascular + Peripheral Vascular Disease  negative cardio ROS  Rhythm:Regular Rate:Normal     Neuro/Psych   Anxiety     TIA   GI/Hepatic negative GI ROS, Neg liver ROS,,,  Endo/Other  negative endocrine ROS    Renal/GU Renal disease  negative genitourinary   Musculoskeletal   Abdominal   Peds  Hematology negative hematology ROS (+)   Anesthesia Other Findings   Reproductive/Obstetrics negative OB ROS                             Anesthesia Physical Anesthesia Plan  ASA: 3  Anesthesia Plan: General   Post-op Pain Management: Tylenol PO (pre-op)*   Induction: Intravenous  PONV Risk Score and Plan: 3 and Ondansetron, Dexamethasone and Treatment may vary due to age or medical condition  Airway Management Planned: Oral ETT  Additional Equipment:   Intra-op Plan:   Post-operative Plan: Extubation in OR  Informed Consent: I have reviewed the patients History and Physical, chart, labs and discussed the procedure including the risks, benefits and alternatives for the proposed anesthesia with the patient or authorized representative who has indicated his/her understanding and acceptance.     Dental advisory given  Plan Discussed with: CRNA  Anesthesia Plan Comments:        Anesthesia Quick Evaluation

## 2023-10-30 NOTE — Transfer of Care (Signed)
Immediate Anesthesia Transfer of Care Note  Patient: Ashley Galloway  Procedure(s) Performed: IRRIGATION AND DEBRIDEMENT OF LEFT THIGHT WOUND (Left)  Patient Location: PACU  Anesthesia Type:General  Level of Consciousness: Patient remains intubated per anesthesia plan  Airway & Oxygen Therapy: Patient remains intubated per anesthesia plan and Patient placed on Ventilator (see vital sign flow sheet for setting)  Post-op Assessment: Report given to RN and Post -op Vital signs reviewed and stable  Post vital signs: Reviewed and stable  Last Vitals:  Vitals Value Taken Time  BP    Temp 36.4 C 10/30/23 1507  Pulse 88 10/30/23 1507  Resp 14 10/30/23 1507  SpO2 100 % 10/30/23 1507  Vitals shown include unfiled device data.  Last Pain:  Vitals:   10/30/23 1202  TempSrc: Oral  PainSc: 0-No pain      Patients Stated Pain Goal: 0 (10/27/23 1200)  Complications: No notable events documented.

## 2023-10-30 NOTE — Significant Event (Signed)
Brief PCCM note:  Returns to the OR intubated.  Unclear why.  Looks like we will need to disarticulation per orthopedic note.  Discussed general surgery plan with surgeon at bedside.  To discuss with family as of disarticulation and surgery.  She is hypotensive.  Stat CBC.  Received 2 units PRBC in the OR.  1 L LR ordered.  Vasopressors started.  Ventilator settings reviewed.  ABG ordered.  Critical care time accumulated throughout the day 31 minutes

## 2023-10-30 NOTE — Interval H&P Note (Signed)
History and Physical Interval Note:  10/30/2023 6:49 AM  Ashley Galloway  has presented today for surgery, with the diagnosis of necrotizing facitis.  The various methods of treatment have been discussed with the patient and family. After consideration of risks, benefits and other options for treatment, the patient has consented to  Procedure(s): IRRIGATION AND DEBRIDEMENT OF LEFT THIGHT WOUND (Left) as a surgical intervention.  The patient's history has been reviewed, patient examined, no change in status, stable for surgery.  I have reviewed the patient's chart and labs.  Questions were answered to the patient's satisfaction.     Nadara Mustard

## 2023-10-30 NOTE — Op Note (Signed)
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 Application Kerecis micro graft 95 cm x 2. Wounds packed open with Kerlix soaked in Vashe.  SURGEON:  Nadara Mustard, MD  PHYSICIAN ASSISTANT:None ANESTHESIA:   General  PREOPERATIVE INDICATIONS:  Ashley Galloway is a  73 y.o. female with a diagnosis of necrotizing facitis who failed conservative measures and elected for surgical management.    The risks benefits and alternatives were discussed with the patient preoperatively including but not limited to the risks of infection, bleeding, nerve injury, cardiopulmonary complications, the need for revision surgery, among others, and the patient was willing to proceed.  OPERATIVE IMPLANTS:   Implant Name Type Inv. Item Serial No. Manufacturer Lot No. LRB No. Used Action  GRAFT SKIN WND SURGICLOSE M95 - [id number] Tissue GRAFT SKIN WND Barrett [employer]  [employer] [id number] Left 1 Implanted  GRAFT SKIN WND SURGICLOSE M95 - [id number] Tissue GRAFT SKIN WND SURGICLOSE M95  [employer] [id number] Left 1 Implanted    @ENCIMAGES @  OPERATIVE FINDINGS: Patient had purulence from the bowel in her pelvis thigh and leg.  This tissue was sent for cultures.  OPERATIVE PROCEDURE: Patient was brought to the operating room and underwent general anesthetic.  After adequate levels anesthesia were obtained patient's left lower extremity was prepped using DuraPrep draped into a sterile field a timeout was called.  There was extensive necrotic skin and soft tissue over the medial thigh this was excised.  There is purulent stool from the abdomen within the leg.  This tissue was sent for cultures.  Patient underwent extensive excision of the hamstrings abductors  and.  Rectus muscles.  The femoral artery was intact.  There was extensive excision of skin both medially and laterally to the thigh which left is essentially a very thin envelope of skin.  Dissection and debridement was carried down to the knee and muscle around the knee was also excised as well as skin and soft tissue.  Extending down to the calf there was further necrosis of the gastrocnemius muscles there is slight contractility of the soleus.  There is further necrosis of skin and soft tissue that was excised.  Vascular bundles that extended to the muscle were suture-ligated with 2-0 silk.  Once hemostasis was obtained the wound was irrigated with Vashe irrigation x 3 bottles.  Hemostasis was stable.  The wounds were then packed open with Kerlix soaked in Vashe times another bottle of Vashe.  This was then covered with ABD dressing Curlex and Coban.  Patient was left intubated and returned to the ICU.  Patient received albumin and 2 units of blood interoperatively with an estimated blood loss of about 1000 cc.   DISCHARGE PLANNING:  Antibiotic duration: Continue IV antibiotics   Discussed patient's case with general surgery.  Anticipate they will proceed with laparotomy surgery for the GI issues and anticipate once this has resolved we will proceed with a hip disarticulation possibly Wednesday or Friday.  Follow-up: In the office 1 week post operative.

## 2023-10-30 NOTE — Progress Notes (Addendum)
I have seen and examined the patient. I have personally reviewed the clinical findings, laboratory findings, microbiological data and imaging studies. The assessment and treatment plan was discussed with the Nurse Practitioner. I agree with her/his recommendations except following additions/corrections.  # Recent streptococcus intermedius bacteremia with hepatic abscess  # Intraabdominal infection with perforated diverticulum/abscess  S/p orthopedics and IR intervention with polymicrobial cultures on appropriate abtx  Plan for OR today with Dr Lajoyce Corners  Febrile.  Exam - elderly female sitting in the bed, HEENT wnl. Heart, lung and abdomen wnl, LLQ JP drain, Left leg is bandaged, wound vac +, No rashes, Left internal jugular CVC, awake, alert and oriented  Continue linezolid and zosyn  Fu OR note, further surgical plans  Blood cx 2 sets given fevers Monitor CBC and BMP Following peripherally over the weekend, please call with questions.    I have personally spent 50 minutes involved in face-to-face and non-face-to-face activities for this patient on the day of the visit. Professional time spent includes the following activities: Preparing to see the patient (review of tests), Obtaining and/or reviewing separately obtained history (admission/discharge record), Performing a medically appropriate examination and/or evaluation , Ordering medications/tests/procedures, referring and communicating with other health care professionals, Documenting clinical information in the EMR, Independently interpreting results (not separately reported), Communicating results to the patient/family/caregiver, Counseling and educating the patient/family/caregiver and Care coordination (not separately reported).      [facility]  Date of Admission:  10/23/2023      Total days of antibiotics 8  Linezolid 1/25 >> c   Piperacillin-Tazobactam 1/5 >> c           ASSESSMENT: Ashley Galloway is a 73 y.o. female admitted with:   Fever -  Fever overnight ot 104 noted. Repeat blood cultures drawn this morning peripherally. Due for OR today - follow up findings on leg. No pressors required. Creatinine stable.  Central line in place L neck placed 5d ago - c/d/I and non-tender  -follow blood cultures  -no changes in abx -FU OR findings   Diverticular Abscess s/p IR Drain -  Polymicrobial as expected on cultures with E coli, Klebsiella Oxytoca, Enteroccoccus Faecium (R-amp), Pseudomonas Aeruginosa (S), Parabacteroides Distasonis (BL+) -Continue linezolid and piperacillin-tazobactam    Necrotizing Fasciitis of LLE -  Extension of #1 into Thigh - Polymicrobial Including Pseudomonas -  Initial I&D 1/25 for this revealing devitalized fascial tissue and mucopurulent material. Cultures growing out E Coli, Klebsiella Oxytoca, Bacteroides Ovatus (BL+).  -Continue linezoild and piperacillin-tazobactam  -May be cause of fevers - follow up OR findings.   MRSE Bacteremia -  Blood cultures positive 1/24 with MRSE  Hepatic Abscess, Dec 2024 -  Streptococcus Intermedius -  Still with persistent abnormality in liver, though difficult to characterize; smaller than before. Covered under current broad regimen if this is still ongoing    PLAN: Continue current antibiotics  Follow up OR findings on leg Follow up blood cultures   Principal Problem:   Diverticulitis of large intestine with abscess Active Problems:   Infective myositis of left thigh   AKI (acute kidney injury) (HCC)   PAD (peripheral artery disease) (HCC)   Hyperglycemia   Protein-calorie malnutrition, severe   Necrotizing soft tissue infection   Necrotizing fasciitis (HCC)   Polymicrobial bacterial infection    sodium chloride   Intravenous Once   sodium chloride   Intravenous Once   ascorbic acid  500 mg Per Tube BID  chlorhexidine  60 mL Topical Once   chlorhexidine  60 mL Topical Once    Chlorhexidine Gluconate Cloth  6 each Topical Daily   docusate  100 mg Per Tube BID   famotidine  20 mg Per Tube BID   feeding supplement  237 mL Per Tube BID BM   heparin injection (subcutaneous)  5,000 Units Subcutaneous Q8H   midodrine  5 mg Per Tube Q8H   multivitamin  15 mL Per Tube Daily   polyethylene glycol  17 g Oral Daily   povidone-iodine  2 Application Topical Once   povidone-iodine  2 Application Topical Once   sodium chloride flush  3 mL Intravenous Q12H   sodium chloride flush  5 mL Intracatheter Q8H   zinc sulfate (50mg  elemental zinc)  220 mg Per Tube Daily    SUBJECTIVE: She is tired and wants to go home. Feels like she needs more help than her husband can offer for home care.  Getting blood and going to OR this afternoon.   Past Medical History:  Diagnosis Date   Centrilobular emphysema (HCC)    Colon polyps    Looks like saw Dr. Marca Ancona in 2021   Diverticulitis    HLD (hyperlipidemia)    TIA (transient ischemic attack)    Vesicointestinal fistula    Past Surgical History:  Procedure Laterality Date   CHOLECYSTECTOMY     I & D EXTREMITY Left 10/24/2023   Procedure: IRRIGATION AND DEBRIDEMENT EXTREMITY Left lower extremity;  Surgeon: Samson Frederic, MD;  Location: [facility];  Service: Orthopedics;  Laterality: Left;  1 and 1/2 large wound sponge as well as a smaller wound sponge placed in upper thigh incision, 1 1/2 large wound sponge placed in lower left leg incision. Staples placed by surgeon in intermittent places to keep sponges inside and wound v   IR GUIDED DRAIN W CATHETER PLACEMENT  09/16/2023   PARTIAL COLECTOMY     Tubulovillous adenoma   SPINE SURGERY     Review of Systems: ROS negative  Allergies  Allergen Reactions   Tape Other (See Comments)    Tape from hormone patch.    OBJECTIVE: Vitals:   10/30/23 1000 10/30/23 1015 10/30/23 1030 10/30/23 1045  BP: 107/60 (!) 110/52 (!) 113/56 (!) 106/55  Pulse: 87 84 83 82  Resp: (!) 24 (!) 31 (!)  24 (!) 24  Temp: 99 F (37.2 C) 99 F (37.2 C) 99.1 F (37.3 C) 99.3 F (37.4 C)  TempSrc:      SpO2: 100% 100% 100% 100%  Weight:      Height:       Body mass index is 31.18 kg/m.  Physical Exam Constitutional:      Appearance: She is ill-appearing.  HENT:     Nose:     Comments: Enteric tube in nare    Mouth/Throat:     Mouth: Mucous membranes are dry.     Pharynx: No oropharyngeal exudate.  Cardiovascular:     Rate and Rhythm: Normal rate and regular rhythm.  Pulmonary:     Effort: Pulmonary effort is normal.     Breath sounds: Normal breath sounds.  Abdominal:     General: Bowel sounds are normal. There is no distension.     Palpations: Abdomen is soft.     Tenderness: There is no abdominal tenderness.  Musculoskeletal:        General: Normal range of motion.     Cervical back: Normal range of motion. No rigidity.  Skin:    General: Skin is warm and dry.     Capillary Refill: Capillary refill takes less than 2 seconds.     Findings: No rash.  Neurological:     Mental Status: She is alert and oriented to person, place, and time.     Lab Results Lab Results  Component Value Date   WBC 6.7 10/30/2023   HGB 8.9 (L) 10/30/2023   HCT 26.3 (L) 10/30/2023   MCV 84.3 10/30/2023   PLT 200 10/30/2023    Lab Results  Component Value Date   CREATININE 0.57 10/30/2023   BUN 12 10/30/2023   NA 135 10/30/2023   K 3.6 10/30/2023   CL 100 10/30/2023   CO2 21 (L) 10/30/2023    Lab Results  Component Value Date   ALT 30 10/24/2023   AST 51 (H) 10/24/2023   ALKPHOS 86 10/24/2023   BILITOT 0.7 10/24/2023     Microbiology: Recent Results (from the past 240 hours)  Resp panel by RT-PCR (RSV, Flu A&B, Covid) Anterior Nasal Swab     Status: None   Collection Time: 10/23/23 11:37 PM   Specimen: Anterior Nasal Swab  Result Value Ref Range Status   SARS Coronavirus 2 by RT PCR NEGATIVE NEGATIVE Final    Comment: (NOTE) SARS-CoV-2 target nucleic acids are NOT  DETECTED.  The SARS-CoV-2 RNA is generally detectable in upper respiratory specimens during the acute phase of infection. The lowest concentration of SARS-CoV-2 viral copies this assay can detect is 138 copies/mL. A negative result does not preclude SARS-Cov-2 infection and should not be used as the sole basis for treatment or other patient management decisions. A negative result may occur with  improper specimen collection/handling, submission of specimen other than nasopharyngeal swab, presence of viral mutation(s) within the areas targeted by this assay, and inadequate number of viral copies(<138 copies/mL). A negative result must be combined with clinical observations, patient history, and epidemiological information. The expected result is Negative.  Fact Sheet for Patients:  BloggerCourse.com  Fact Sheet for Healthcare Providers:  SeriousBroker.it  This test is no t yet approved or cleared by the [place] FDA and  has been authorized for detection and/or diagnosis of SARS-CoV-2 by FDA under an Emergency Use Authorization (EUA). This EUA will remain  in effect (meaning this test can be used) for the duration of the COVID-19 declaration under Section 564(b)(1) of the Act, 21 U.S.C.section 360bbb-3(b)(1), unless the authorization is terminated  or revoked sooner.       Influenza A by PCR NEGATIVE NEGATIVE Final   Influenza B by PCR NEGATIVE NEGATIVE Final    Comment: (NOTE) The Xpert Xpress SARS-CoV-2/FLU/RSV plus assay is intended as an aid in the diagnosis of influenza from Nasopharyngeal swab specimens and should not be used as a sole basis for treatment. Nasal washings and aspirates are unacceptable for Xpert Xpress SARS-CoV-2/FLU/RSV testing.  Fact Sheet for Patients: BloggerCourse.com  Fact Sheet for Healthcare Providers: SeriousBroker.it  This test is not yet  approved or cleared by the [place] FDA and has been authorized for detection and/or diagnosis of SARS-CoV-2 by FDA under an Emergency Use Authorization (EUA). This EUA will remain in effect (meaning this test can be used) for the duration of the COVID-19 declaration under Section 564(b)(1) of the Act, 21 U.S.C. section 360bbb-3(b)(1), unless the authorization is terminated or revoked.     Resp Syncytial Virus by PCR NEGATIVE NEGATIVE Final    Comment: (NOTE) Fact Sheet for Patients: BloggerCourse.com  Fact Sheet for Healthcare Providers: SeriousBroker.it  This test is not yet approved or cleared by the [place] FDA and has been authorized for detection and/or diagnosis of SARS-CoV-2 by FDA under an Emergency Use Authorization (EUA). This EUA will remain in effect (meaning this test can be used) for the duration of the COVID-19 declaration under Section 564(b)(1) of the Act, 21 U.S.C. section 360bbb-3(b)(1), unless the authorization is terminated or revoked.  Performed at [facility], [address] [place], [place] [address]   Blood Culture (routine x 2)     Status: Abnormal   Collection Time: 10/23/23 11:37 PM   Specimen: BLOOD  Result Value Ref Range Status   Specimen Description   Final    BLOOD LEFT ANTECUBITAL Performed at [facility], [address], [place], [place] [address]    Special Requests   Final    BOTTLES DRAWN AEROBIC AND ANAEROBIC Blood Culture results may not be optimal due to an inadequate volume of blood received in culture bottles Performed at [facility], [address], [place], [place] [address]    Culture  Setup Time   Final    GRAM POSITIVE COCCI IN CLUSTERS AEROBIC BOTTLE ONLY CRITICAL RESULT CALLED TO, READ BACK BY AND VERIFIED WITH: PHARMD E JACKSON 10/24/2023 @ 2345 BY AB    Culture (A)  Final    STAPHYLOCOCCUS  EPIDERMIDIS THE SIGNIFICANCE OF ISOLATING THIS ORGANISM FROM A SINGLE SET OF BLOOD CULTURES WHEN MULTIPLE SETS ARE DRAWN IS UNCERTAIN. PLEASE NOTIFY THE MICROBIOLOGY DEPARTMENT WITHIN ONE WEEK IF SPECIATION AND SENSITIVITIES ARE REQUIRED. Performed at [facility], [address], [place], [place] [address]    Report Status 10/25/2023 FINAL  Final  Blood Culture ID Panel (Reflexed)     Status: Abnormal   Collection Time: 10/23/23 11:37 PM  Result Value Ref Range Status   Enterococcus faecalis NOT DETECTED NOT DETECTED Final   Enterococcus Faecium NOT DETECTED NOT DETECTED Final   Listeria monocytogenes NOT DETECTED NOT DETECTED Final   Staphylococcus species DETECTED (A) NOT DETECTED Final    Comment: CRITICAL RESULT CALLED TO, READ BACK BY AND VERIFIED WITH: PHARMD E JACKSON 10/24/2023 @ 2345 BY AB    Staphylococcus aureus (BCID) NOT DETECTED NOT DETECTED Final   Staphylococcus epidermidis DETECTED (A) NOT DETECTED Final    Comment: Methicillin (oxacillin) resistant coagulase negative staphylococcus. Possible blood culture contaminant (unless isolated from more than one blood culture draw or clinical case suggests pathogenicity). No antibiotic treatment is indicated for blood  culture contaminants. CRITICAL RESULT CALLED TO, READ BACK BY AND VERIFIED WITH: PHARMD E JACKSON 10/24/2023 @ 2345 BY AB    Staphylococcus lugdunensis NOT DETECTED NOT DETECTED Final   Streptococcus species NOT DETECTED NOT DETECTED Final   Streptococcus agalactiae NOT DETECTED NOT DETECTED Final   Streptococcus pneumoniae NOT DETECTED NOT DETECTED Final   Streptococcus pyogenes NOT DETECTED NOT DETECTED Final   A.calcoaceticus-baumannii NOT DETECTED NOT DETECTED Final   Bacteroides fragilis NOT DETECTED NOT DETECTED Final   Enterobacterales NOT DETECTED NOT DETECTED Final   Enterobacter cloacae complex NOT DETECTED NOT DETECTED Final   Escherichia coli NOT DETECTED NOT DETECTED Final   Klebsiella  aerogenes NOT DETECTED NOT DETECTED Final   Klebsiella oxytoca NOT DETECTED NOT DETECTED Final   Klebsiella pneumoniae NOT DETECTED NOT DETECTED Final   Proteus species NOT DETECTED NOT DETECTED Final   Salmonella species NOT DETECTED NOT DETECTED Final   Serratia marcescens NOT DETECTED  NOT DETECTED Final   Haemophilus influenzae NOT DETECTED NOT DETECTED Final   Neisseria meningitidis NOT DETECTED NOT DETECTED Final   Pseudomonas aeruginosa NOT DETECTED NOT DETECTED Final   Stenotrophomonas maltophilia NOT DETECTED NOT DETECTED Final   Candida albicans NOT DETECTED NOT DETECTED Final   Candida auris NOT DETECTED NOT DETECTED Final   Candida glabrata NOT DETECTED NOT DETECTED Final   Candida krusei NOT DETECTED NOT DETECTED Final   Candida parapsilosis NOT DETECTED NOT DETECTED Final   Candida tropicalis NOT DETECTED NOT DETECTED Final   Cryptococcus neoformans/gattii NOT DETECTED NOT DETECTED Final   Methicillin resistance mecA/C DETECTED (A) NOT DETECTED Final    Comment: CRITICAL RESULT CALLED TO, READ BACK BY AND VERIFIED WITH: PHARMD E JACKSON 10/24/2023 @ 2345 BY AB Performed at [facility], [address], [place], [place] [address]   Blood Culture (routine x 2)     Status: None   Collection Time: 10/24/23 12:41 AM   Specimen: BLOOD  Result Value Ref Range Status   Specimen Description   Final    BLOOD BLOOD RIGHT ARM Performed at [facility], [address], [place], [place] [address]    Special Requests   Final    BOTTLES DRAWN AEROBIC AND ANAEROBIC Blood Culture results may not be optimal due to an inadequate volume of blood received in culture bottles Performed at [facility], [address] [place], [place] [address]    Culture   Final    NO GROWTH 5 DAYS Performed at [facility], [address] [place], [place] [address]    Report Status 10/29/2023 FINAL  Final  MRSA Next Gen by PCR, Nasal     Status: None    Collection Time: 10/24/23  1:02 PM   Specimen: Nasal Mucosa; Nasal Swab  Result Value Ref Range Status   MRSA by PCR Next Gen NOT DETECTED NOT DETECTED Final    Comment: (NOTE) The GeneXpert MRSA Assay (FDA approved for NASAL specimens only), is one component of a comprehensive MRSA colonization surveillance program. It is not intended to diagnose MRSA infection nor to guide or monitor treatment for MRSA infections. Test performance is not FDA approved in patients less than 60 years old. Performed at [facility], [address], [place], [place] [address]   Aerobic/Anaerobic Culture w Gram Stain (surgical/deep wound)     Status: None   Collection Time: 10/24/23  4:22 PM   Specimen: Abscess  Result Value Ref Range Status   Specimen Description   Final    ABSCESS DRAIN Performed at [facility], [address], [place], [place] [address]    Special Requests   Final    NONE Performed at [facility], [address] [place], [place] [address]    Gram Stain   Final    FEW WBC PRESENT, PREDOMINANTLY PMN ABUNDANT GRAM POSITIVE COCCI MODERATE GRAM NEGATIVE RODS MODERATE GRAM POSITIVE RODS    Culture   Final    ABUNDANT ESCHERICHIA COLI ABUNDANT KLEBSIELLA OXYTOCA ABUNDANT ENTEROCOCCUS FAECIUM MODERATE PSEUDOMONAS AERUGINOSA SUSCEPTIBILITIES PERFORMED ON PREVIOUS CULTURE WITHIN THE LAST 5 DAYS. ABUNDANT PARABACTEROIDES DISTASONIS BETA LACTAMASE POSITIVE Performed at [facility], [address], [place], [place] [address]    Report Status 10/27/2023 FINAL  Final   Organism ID, Bacteria ENTEROCOCCUS FAECIUM  Final   Organism ID, Bacteria PSEUDOMONAS AERUGINOSA  Final      Susceptibility   Enterococcus  faecium - MIC*    AMPICILLIN >=32 RESISTANT Resistant     VANCOMYCIN <=0.5 SENSITIVE Sensitive     GENTAMICIN SYNERGY SENSITIVE Sensitive     * ABUNDANT ENTEROCOCCUS FAECIUM   Pseudomonas aeruginosa - MIC*     CEFTAZIDIME <=1 SENSITIVE Sensitive     CIPROFLOXACIN <=0.25 SENSITIVE Sensitive     GENTAMICIN <=1 SENSITIVE Sensitive     IMIPENEM 0.5 SENSITIVE Sensitive     PIP/TAZO <=4 SENSITIVE Sensitive ug/mL    CEFEPIME 0.25 SENSITIVE Sensitive     * MODERATE PSEUDOMONAS AERUGINOSA  Aerobic/Anaerobic Culture w Gram Stain (surgical/deep wound)     Status: None   Collection Time: 10/24/23  9:44 PM   Specimen: Abscess; Wound  Result Value Ref Range Status   Specimen Description   Final    ABSCESS Performed at [facility], [address] [place], [place] [address]    Special Requests   Final    ABSCESS Performed at [facility], [address], [place], [place] [address]    Gram Stain   Final    FEW WBC PRESENT, PREDOMINANTLY PMN MODERATE GRAM NEGATIVE RODS MODERATE GRAM POSITIVE COCCI    Culture   Final    MODERATE KLEBSIELLA OXYTOCA FEW PSEUDOMONAS AERUGINOSA FEW ENTEROCOCCUS FAECIUM SUSCEPTIBILITIES PERFORMED ON PREVIOUS CULTURE WITHIN THE LAST 5 DAYS. MODERATE BACTEROIDES THETAIOTAOMICRON BETA LACTAMASE POSITIVE Performed at [facility], [address] [place], [place] [address]    Report Status 10/28/2023 FINAL  Final   Organism ID, Bacteria KLEBSIELLA OXYTOCA  Final      Susceptibility   Klebsiella oxytoca - MIC*    AMPICILLIN RESISTANT Resistant     CEFEPIME <=0.12 SENSITIVE Sensitive     CEFTAZIDIME <=1 SENSITIVE Sensitive     CEFTRIAXONE <=0.25 SENSITIVE Sensitive     CIPROFLOXACIN <=0.25 SENSITIVE Sensitive     GENTAMICIN <=1 SENSITIVE Sensitive     IMIPENEM <=0.25 SENSITIVE Sensitive     TRIMETH/SULFA <=20 SENSITIVE Sensitive     AMPICILLIN/SULBACTAM <=2 SENSITIVE Sensitive     PIP/TAZO <=4 SENSITIVE Sensitive ug/mL    * MODERATE KLEBSIELLA OXYTOCA  Aerobic/Anaerobic Culture w Gram Stain (surgical/deep wound)     Status: None   Collection Time: 10/24/23  9:47 PM   Specimen: Path Tissue  Result Value Ref Range Status    Specimen Description   Final    TISSUE Performed at [facility], [address], [place], [place] [address]    Special Requests   Final    TISSUE Performed at [facility], [address], [place], [place] [address]    Gram Stain   Final    RARE WBC PRESENT, PREDOMINANTLY PMN FEW GRAM POSITIVE COCCI RARE GRAM NEGATIVE RODS    Culture   Final    MODERATE ESCHERICHIA COLI MODERATE KLEBSIELLA OXYTOCA MODERATE BACTEROIDES OVATUS BETA LACTAMASE POSITIVE Performed at [facility], [address] [place], [place] [address]    Report Status 10/29/2023 FINAL  Final   Organism ID, Bacteria ESCHERICHIA COLI  Final   Organism ID, Bacteria KLEBSIELLA OXYTOCA  Final      Susceptibility   Escherichia coli - MIC*    AMPICILLIN >=32 RESISTANT Resistant     CEFEPIME <=0.12 SENSITIVE Sensitive     CEFTAZIDIME <=1 SENSITIVE Sensitive     CEFTRIAXONE <=0.25 SENSITIVE Sensitive     CIPROFLOXACIN <=0.25 SENSITIVE Sensitive     GENTAMICIN <=1 SENSITIVE Sensitive     IMIPENEM <=0.25  SENSITIVE Sensitive     TRIMETH/SULFA <=20 SENSITIVE Sensitive     AMPICILLIN/SULBACTAM 8 SENSITIVE Sensitive     PIP/TAZO <=4 SENSITIVE Sensitive ug/mL    * MODERATE ESCHERICHIA COLI   Klebsiella oxytoca - MIC*    AMPICILLIN RESISTANT Resistant     CEFEPIME <=0.12 SENSITIVE Sensitive     CEFTAZIDIME <=1 SENSITIVE Sensitive     CEFTRIAXONE <=0.25 SENSITIVE Sensitive     CIPROFLOXACIN <=0.25 SENSITIVE Sensitive     GENTAMICIN <=1 SENSITIVE Sensitive     IMIPENEM <=0.25 SENSITIVE Sensitive     TRIMETH/SULFA <=20 SENSITIVE Sensitive     AMPICILLIN/SULBACTAM <=2 SENSITIVE Sensitive     PIP/TAZO <=4 SENSITIVE Sensitive ug/mL    * MODERATE KLEBSIELLA OXYTOCA   Imaging DG Abd 1 View Result Date: 10/28/2023 CLINICAL DATA:  [id number] Encounter for nasogastric (NG) tube placement [id number] EXAM: ABDOMEN - 1 VIEW COMPARISON:  None Available. FINDINGS: A weighted tip enteric feeding  tube is present. The tip of the tube is in the region of the pylorus. No evidence of bowel obstruction. Surgical clips present in the right upper quadrant. Low lung volumes. IMPRESSION: The weighted tip of the enteric feeding tube is in the region of the pylorus. Electronically Signed   By: Malachy Moan M.D.   On: 10/28/2023 14:50   DG Chest 1 View Result Date: 10/24/2023 CLINICAL DATA:  Dyspnea.  Central line placement. EXAM: CHEST  1 VIEW COMPARISON:  Radiographs yesterday, chest CT earlier today FINDINGS: Left internal jugular central venous catheter tip overlies the right atrium, partially obscured by overlying monitoring devices. No pneumothorax. Overall low lung volumes. No focal airspace disease, large pleural effusion or pulmonary edema. The heart is normal in size. IMPRESSION: Left internal jugular central venous catheter tip overlies the right atrium. No pneumothorax. Electronically Signed   By: Narda Rutherford M.D.   On: 10/24/2023 20:47   CT GUIDED PERITONEAL/RETROPERITONEAL FLUID DRAIN BY [facility] CATH Result Date: 10/24/2023 CLINICAL DATA:  Diverticular abscess EXAM: CT GUIDED DRAINAGE OF PELVIC ABSCESS ANESTHESIA/SEDATION: Intravenous Fentanyl and Versed 1mg  were administered by RN during a total moderate (conscious) sedation time of 20 minutes; the patient's level of consciousness and physiological / cardiorespiratory status were monitored continuously by radiology RN under my direct supervision. PROCEDURE: The procedure, risks, benefits, and alternatives were explained to the patient. Questions regarding the procedure were encouraged and answered. The patient understands and consents to the procedure. select axial scans through the pelvis were obtained. the complex left pelvic fluid collection abutting the urinary bladder was localized and an appropriate skin entry site was determined and marked. The operative field was prepped with chlorhexidinein a sterile fashion, and a sterile  drape was applied covering the operative field. A sterile gown and sterile gloves were used for the procedure. Local anesthesia was provided with 1% Lidocaine. Under CT fluoroscopic guidance, 18 gauge trocar needle advanced to the collection. Amplatz guidewire advanced easily within the collection, confirmed on CT fluoroscopy. Tract dilated to facilitate placement of a 12 French pigtail drain catheter, formed centrally within the collection. A 10 mL of feculent material were aspirated, sent for Gram stain and culture. Catheter secured externally 0 Prolene sutures and StatLock and placed to gravity drain bag. The patient tolerated the procedure well. RADIATION DOSE REDUCTION: This exam was performed according to the departmental dose-optimization program which includes automated exposure control, adjustment of the mA and/or kV according to patient size and/or use of iterative reconstruction technique. COMPLICATIONS: None immediate FINDINGS: Complex gas and  debris filled collection in the low left pelvis abutting the urinary bladder is again localized. Deep gas in the fascial planes of the proximal left thigh also noted, distal extent not visualized. 12 French pigtail drain catheter placed as above. Feculent aspirate sent for Gram stain and culture. IMPRESSION: 1. Technically successful CT-guided pelvic abscess drain catheter placement. Feculent aspirate sent for Gram stain and culture. 2. Gas bubbles dissecting through deep muscular and fascial planes of the proximal left thigh. This finding was telephoned to Dr. Natale Milch at the beginning of the procedure. Electronically Signed   By: Corlis Leak M.D.   On: 10/24/2023 19:22   CT EXTREMITY LOWER LEFT WO CONTRAST Result Date: 10/24/2023 CLINICAL DATA:  Recent drain placement and abdomen. Scan due to pain, swelling, and subcutaneous free air. Evaluate for necrotizing fasciitis. EXAM: CT OF THE LOWER LEFT EXTREMITY WITHOUT CONTRAST TECHNIQUE: Multidetector CT imaging  of the lower left extremity was performed according to the standard protocol. RADIATION DOSE REDUCTION: This exam was performed according to the departmental dose-optimization program which includes automated exposure control, adjustment of the mA and/or kV according to patient size and/or use of iterative reconstruction technique. COMPARISON:  CT chest, abdomen, and pelvis 10/24/2023 FINDINGS: Bones/Joint/Cartilage No cortical erosion is seen.  No acute fracture. There are individual screws within the distal aspect of the first and fifth metatarsals. Ligaments Suboptimally assessed by CT. Muscles and Tendons There is again air seen tracking along the sartorius and the left groin musculature as on CT of the chest, abdomen, and pelvis earlier today. There is extension of this air within the fascial planes surrounding these muscles throughout the more distal thigh, and air is seen around fascial planes within the sartorius, anterior left quadriceps, obturator externus, adductor longus, brevis, and magnus. Within the mid to distal thigh air surrounds the hamstring fascial planes diffusely. Within the calf, air surrounds the fascial planes of the medial greater than lateral heads of the gastrocnemius muscles to the level of the ankle. Findings are concerning for necrotizing fasciitis diffusely throughout the left lower leg. Within the thigh, although the anterior superficial portion of the quadriceps musculature appears affected, and the deep fascial planes of the quadriceps musculature do not demonstrate air. Within the calf, the anterior compartment and deep posterior compartment appear less affected. Soft tissues There is moderate diffuse subcutaneous fat edema and swelling throughout the left thigh and calf. There is density likely representing confluent edema lateral to the knee (axial series 6, image 319 and coronal series 9, image 118). No walled-off abscess is seen. There is a new anterior left pelvic approach  drainage catheter terminating within the region of the complex collection containing air and fluid within left hemipelvis on CT chest, abdomen, pelvis earlier today. That suspected abscess adjacent to the wall thickening within the sigmoid colon is markedly decreased from prior. A Foley catheter is seen within the urinary bladder which contains contrast. IMPRESSION: 1. There is extensive air tracking along the fascial planes of the left hip, groin, thigh, and posterior calf musculature concerning for diffuse necrotizing fasciitis extending down to the level of the ankle. 2. There is moderate diffuse subcutaneous fat edema and swelling throughout the left thigh and calf. There is density likely representing confluent edema lateral to the knee. No walled-off abscess is seen. 3. There is a new anterior left pelvic approach drainage catheter terminating within the region of the complex collection containing air and fluid within left hemipelvis on CT chest, abdomen, pelvis earlier today. That  suspected abscess adjacent to the wall thickening within the sigmoid colon is markedly decreased from prior. Critical Value/emergent results were called by telephone at the time of interpretation on 10/24/2023 at 6:12 pm to provider Carma Leaven , who verbally acknowledged these results. Electronically Signed   By: Neita Garnet M.D.   On: 10/24/2023 18:15   CT Angio Chest/Abd/Pel for Dissection W and/or Wo Contrast Result Date: 10/24/2023 CLINICAL DATA:  Septic arterial embolism. Left leg pain and swelling. History of liver abscess drained on 08/20/2024. EXAM: CT ANGIOGRAPHY CHEST, ABDOMEN AND PELVIS TECHNIQUE: Non-contrast CT of the chest was initially obtained. Multidetector CT imaging through the chest, abdomen and pelvis was performed using the standard protocol during bolus administration of intravenous contrast. Multiplanar reconstructed images and MIPs were obtained and reviewed to evaluate the vascular anatomy.  RADIATION DOSE REDUCTION: This exam was performed according to the departmental dose-optimization program which includes automated exposure control, adjustment of the mA and/or kV according to patient size and/or use of iterative reconstruction technique. CONTRAST:  OMNIPAQUE IOHEXOL 350 MG/ML SOLN COMPARISON:  CT abdomen 10/21/2023. CT abdomen and pelvis 09/14/2023. FINDINGS: CTA CHEST FINDINGS Cardiovascular: Preferential opacification of the thoracic aorta. No evidence of thoracic aortic aneurysm or dissection. Normal heart size. No pericardial effusion. There is calcified atherosclerotic disease throughout the aorta. Bovine arch anatomy is noted. Mediastinum/Nodes: No enlarged mediastinal, hilar, or axillary lymph nodes. Thyroid gland, trachea, and esophagus demonstrate no significant findings. Lungs/Pleura: Are minimal atelectatic changes in the bilateral lower lobes and lingula. There is no pleural effusion or pneumothorax. Musculoskeletal: No chest wall abnormality. No acute or significant osseous findings. Review of the MIP images confirms the above findings. CTA ABDOMEN AND PELVIS FINDINGS VASCULAR Aorta: Normal caliber aorta without aneurysm, dissection, vasculitis or significant stenosis. There is calcified atherosclerotic disease throughout the aorta. 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 Otherwise, the celiac artery and its branches appear within normal limits. SMA: Patent without evidence of aneurysm, dissection, vasculitis or significant stenosis. Renals: There is mild-to-moderate focal stenosis of the origin of the left renal artery secondary to atherosclerotic disease. Renal arteries are otherwise within normal limits. IMA: Patent without evidence of aneurysm, dissection, vasculitis or significant stenosis. Inflow: Patent without evidence of aneurysm, dissection, vasculitis or significant stenosis. Calcified atherosclerosis is  present. Other: There is moderate focal stenosis in the proximal left superficial femoral artery. There severe focal stenosis and possible focal thrombosis in the proximal right profunda femoral artery. Veins: No obvious venous abnormality within the limitations of this arterial phase study. Review of the MIP images confirms the above findings. NON-VASCULAR Hepatobiliary: Percutaneous drainage catheter has been removed. Ill-defined heterogeneous hypodensity is again seen in the central liver measuring proximally 5.9 x 3.8 cm similar to the prior examination. Rounded hypodensity in the dome of the liver is unchanged and favored as a cyst. Gallbladder is surgically absent. There stable common bile duct dilatation. Pancreas: Unremarkable. No pancreatic ductal dilatation or surrounding inflammatory changes. Spleen: Normal in size without focal abnormality. Adrenals/Urinary Tract: There some bilateral adrenal thickening which is new from prior. There is no hydronephrosis or perinephric stranding. There is a cyst in the superior pole the right kidney measuring 1.7 cm. Bladder is within normal limits. Stomach/Bowel: There is no evidence for bowel obstruction, pneumatosis or free air. There is diffuse colonic diverticulosis. There is a large amount of stool throughout the colon. The appendix is not seen. There is wall thickening of  the mid sigmoid colon without surrounding inflammatory stranding. There is a new collection containing presumed fecal matter and air in the left lower quadrant measuring 6.8 x 6.2 by 6.9 cm. This is inferior to and abutting the sigmoid colon without surrounding inflammation. Small bowel and stomach are within normal limits. Lymphatic: No enlarged lymph nodes are identified. Reproductive: Small calcified fibroids are present. Adnexa are within normal limits. Other: No abdominal wall hernia or abnormality. No abdominopelvic ascites. Musculoskeletal: No acute osseous abnormality. There is soft  tissue gas and edema within the abductor thigh musculature and anterior thigh musculature. Review of the MIP images confirms the above findings. IMPRESSION: VASCULAR 1. No evidence for aortic dissection or aneurysm. 2. 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 3. Moderate focal stenosis in the proximal left superficial femoral artery. 4. Severe focal stenosis and possible focal thrombosis in the proximal right profunda femoral artery. Aortic Atherosclerosis (ICD10-I70.0). NON_VASCULAR 1. New 6.9 cm collection containing presumed fecal matter and air in the left lower quadrant abutting the sigmoid colon. Findings may represent a contained perforation of diverticulum with abscess formation. Please correlate clinically. 2. Wall thickening of the mid sigmoid colon without surrounding inflammatory stranding. Findings may be related to colitis/diverticulitis. Underlying lesion can not be excluded. 3. Large amount of stool throughout the colon. 4. Soft tissue gas and edema within the left abductor thigh musculature and anterior thigh musculature worrisome for infection/myositis/fasciitis including necrotizing fasciitis. 5. Stable ill-defined hypodensity in the central liver favored as abscess. 6. New bilateral adrenal thickening. These results were called by telephone at the time of interpretation on 10/24/2023 at 1:38 am to provider [facility] , who verbally acknowledged these results. Electronically Signed   By: Darliss Cheney M.D.   On: 10/24/2023 01:41   DG Chest Port 1 View Result Date: 10/23/2023 CLINICAL DATA:  Questionable sepsis EXAM: PORTABLE CHEST 1 VIEW COMPARISON:  Chest x-ray 09/14/2023 FINDINGS: The heart size and mediastinal contours are within normal limits. Both lungs are clear. The visualized skeletal structures are unremarkable. IMPRESSION: No active disease. Electronically Signed   By: Darliss Cheney M.D.   On: 10/23/2023 23:49   CT ABDOMEN W CONTRAST Result Date:  10/21/2023 CLINICAL DATA:  73 year old female with history of hepatic abscess status post percutaneous drain placement on 09/16/2023 presenting for initial drain follow-up. EXAM: CT ABDOMEN WITH CONTRAST TECHNIQUE: Multidetector CT imaging of the abdomen was performed using the standard protocol following bolus administration of intravenous contrast. RADIATION DOSE REDUCTION: This exam was performed according to the departmental dose-optimization program which includes automated exposure control, adjustment of the mA and/or kV according to patient size and/or use of iterative reconstruction technique. CONTRAST:  75mL OMNIPAQUE IOHEXOL 350 MG/ML SOLN COMPARISON:  09/14/2023, 09/15/2023 FINDINGS: Lower chest: No acute abnormality. Hepatobiliary: Significantly decreased size of previously visualized right lobe hepatic abscess, now with ill-defined borders measuring approximately 4.9 x 5.0 x 3.4 cm (AP by trans by CC, previously measuring up to approximately 12.5 cm. Percutaneous hepatic fluid collection pigtail drainage catheter in unchanged position. Unchanged right hepatic dome subcentimeter simple hepatic cyst. The liver is otherwise within normal limits. Postsurgical changes after cholecystectomy. Mild compensatory extrahepatic biliary ductal dilation. Pancreas: Unremarkable. No pancreatic ductal dilatation or surrounding inflammatory changes. Spleen: Normal in size without focal abnormality. Adrenals/Urinary Tract: Adrenal glands are unremarkable. Unchanged simple cyst in the right kidney, not requiring additional follow-up. Kidneys are normal, without renal calculi, focal lesion, or hydronephrosis. Bladder is unremarkable. Stomach/Bowel: Stomach is within normal limits. Scattered  colonic diverticula, most prominent in the visualized proximal descending colon. Evidence of bowel wall thickening, distention, or inflammatory changes. Vascular/Lymphatic: Aortic atherosclerosis. No enlarged abdominal or pelvic lymph  nodes. Other: Similar appearing small, uncomplicated umbilical hernia. Musculoskeletal: No acute osseous abnormality. Similar appearing multilevel degenerative changes of the visualized thoracolumbar spine. IMPRESSION: 1. Significantly decreased size and decreased conspicuity of previously visualized right hepatic abscess with unchanged position of indwelling percutaneous drainage catheter. 2. Diverticulosis. 3. Aortic atherosclerosis. Aortic Atherosclerosis (ICD10-I70.0). Marliss Coots, MD [facility] Electronically Signed   By: Marliss Coots M.D.   On: 10/21/2023 21:54   ECHOCARDIOGRAM COMPLETE Result Date: 10/05/2023    ECHOCARDIOGRAM REPORT   Patient Name:   MARCHETA HORSEY Date of Exam: 10/05/2023 Medical Rec #:  [id number]                 Height:       67.0 in Accession #:    [id number]                Weight:       186.1 lb Date of Birth:  1951/03/14                 BSA:          1.961 m Patient Age:    72 years                  BP:           127/78 mmHg Patient Gender: F                         HR:           56 bpm. Exam Location:  [address] Procedure: 2D Echo, Cardiac Doppler and Color Doppler Indications:    R78.81 Bacteremia  History:        Patient has no prior history of Echocardiogram examinations.                 Bacteremia; Risk Factors:Hypertension, Dyslipidemia and Current                 Smoker.  Sonographer:    Samule Ohm RDCS Referring Phys: [id number] RONALD POLITE IMPRESSIONS  1. Left ventricular ejection fraction, by estimation, is 60 to 65%. The left ventricle has normal function. The left ventricle has no regional wall motion abnormalities. There is mild concentric left ventricular hypertrophy. Left ventricular diastolic parameters are consistent with Grade I diastolic dysfunction (impaired relaxation).  2. Right ventricular systolic function is normal. The right ventricular size is normal.  3. Left atrial size was mildly  dilated.  4. The mitral valve is normal in structure. Trivial mitral valve regurgitation. No evidence of mitral stenosis.  5. The aortic valve is tricuspid. There is mild calcification of the aortic valve. Aortic valve regurgitation is mild. Aortic valve sclerosis/calcification is present, without any evidence of aortic stenosis.  6. The inferior vena cava is normal in size with greater than 50% respiratory variability, suggesting right atrial pressure of 3 mmHg. FINDINGS  Left Ventricle: Left ventricular ejection fraction, by estimation, is 60 to 65%. The left ventricle has normal function. The left ventricle has no regional wall motion abnormalities. The left ventricular internal cavity size was normal in size. There is  mild concentric left ventricular hypertrophy. Left ventricular diastolic parameters are consistent with Grade I diastolic dysfunction (impaired relaxation). Right Ventricle: The right ventricular size is normal.  No increase in right ventricular wall thickness. Right ventricular systolic function is normal. Left Atrium: Left atrial size was mildly dilated. Right Atrium: Right atrial size was normal in size. Pericardium: There is no evidence of pericardial effusion. Mitral Valve: The mitral valve is normal in structure. Trivial mitral valve regurgitation. No evidence of mitral valve stenosis. Tricuspid Valve: The tricuspid valve is normal in structure. Tricuspid valve regurgitation is trivial. No evidence of tricuspid stenosis. Aortic Valve: The aortic valve is tricuspid. There is mild calcification of the aortic valve. Aortic valve regurgitation is mild. Aortic regurgitation PHT measures 477 msec. Aortic valve sclerosis/calcification is present, without any evidence of aortic stenosis. Pulmonic Valve: The pulmonic valve was normal in structure. Pulmonic valve regurgitation is not visualized. No evidence of pulmonic stenosis. Aorta: The aortic root is normal in size and structure. Venous: The  inferior vena cava is normal in size with greater than 50% respiratory variability, suggesting right atrial pressure of 3 mmHg. IAS/Shunts: No atrial level shunt detected by color flow Doppler.  LEFT VENTRICLE PLAX 2D LVIDd:         4.30 cm   Diastology LVIDs:         2.40 cm   LV e' medial:    9.31 cm/s LV PW:         0.90 cm   LV E/e' medial:  10.2 LV IVS:        1.20 cm   LV e' lateral:   10.53 cm/s LVOT diam:     1.80 cm   LV E/e' lateral: 9.0 LV SV:         70 LV SV Index:   36 LVOT Area:     2.54 cm  RIGHT VENTRICLE            IVC RVSP:           36.6 mmHg  IVC diam: 1.30 cm LEFT ATRIUM             Index        RIGHT ATRIUM           Index LA diam:        3.80 cm 1.94 cm/m   RA Pressure: 3.00 mmHg LA Vol (A2C):   28.5 ml 14.53 ml/m  RA Area:     8.26 cm LA Vol (A4C):   53.6 ml 27.33 ml/m  RA Volume:   14.50 ml  7.39 ml/m LA Biplane Vol: 42.3 ml 21.57 ml/m  AORTIC VALVE LVOT Vmax:   140.40 cm/s LVOT Vmean:  79.380 cm/s LVOT VTI:    0.275 m AI PHT:      477 msec  AORTA Ao Root diam: 2.80 cm Ao Asc diam:  3.30 cm MITRAL VALVE               TRICUSPID VALVE MV Area (PHT): 3.03 cm    TR Peak grad:   33.6 mmHg MV Decel Time: 250 msec    TR Vmax:        290.00 cm/s MV E velocity: 95.00 cm/s  Estimated RAP:  3.00 mmHg MV A velocity: 93.20 cm/s  RVSP:           36.6 mmHg MV E/A ratio:  1.02                            SHUNTS  Systemic VTI:  0.28 m                            Systemic Diam: 1.80 cm Arvilla Meres MD Electronically signed by Arvilla Meres MD Signature Date/Time: 10/05/2023/2:29:12 PM    Final      Rexene Alberts, MSN, NP-C [facility]  [email or web address] Pager: [phone] Office: [phone] [facility] Main Line: [phone] *Secure Chat Communication Welcome

## 2023-10-30 NOTE — Anesthesia Procedure Notes (Signed)
Arterial Line Insertion Performed by: Ammie Dalton, CRNA, CRNA  Patient location: Pre-op. Preanesthetic checklist: patient identified, IV checked, site marked, risks and benefits discussed, surgical consent, monitors and equipment checked, pre-op evaluation, timeout performed and anesthesia consent radial was placed Catheter size: 20 G Hand hygiene performed  and maximum sterile barriers used   Attempts: 1 Procedure performed without using ultrasound guided technique. Following insertion, dressing applied and Biopatch. Post procedure assessment: normal and unchanged

## 2023-10-30 NOTE — Anesthesia Postprocedure Evaluation (Signed)
Anesthesia Post Note  Patient: Ashley Galloway  Procedure(s) Performed: IRRIGATION AND DEBRIDEMENT OF LEFT THIGHT WOUND (Left)     Patient location during evaluation: ICU Anesthesia Type: General Level of consciousness: patient remains intubated per anesthesia plan Pain management: pain level controlled Vital Signs Assessment: vitals unstable Respiratory status: respiratory function unstable and patient remains intubated per anesthesia plan Cardiovascular status: unstable Postop Assessment: no apparent nausea or vomiting Anesthetic complications: no Comments: Patient taken back to ICU post op, intubated connected to mechanical ventilation.   No notable events documented.  Last Vitals:  Vitals:   10/30/23 1513 10/30/23 1514  BP:    Pulse: 88 88  Resp: 16 16  Temp: (!) 36.4 C (!) 36.4 C  SpO2: 100% 100%    Last Pain:  Vitals:   10/30/23 1202  TempSrc: Oral  PainSc: 0-No pain                 Ashley Teters A.

## 2023-10-30 NOTE — Progress Notes (Signed)
Patient went for debridement with Dr. Lajoyce Corners today of LLE. He noted fecal drainage from pelvis into her thigh and leg and most of tissue necrotic, femoral artery intact. Given findings, will need Hartman's procedure definitively before hip disarticulation to control infection. Initially planned for CT scan today prior to OR findings, will cancel now.  Had discussion with patient's brother and husband in ICU. Discussed patient is high risk for complications with hip disarticulation as well as exploratory laparotomy with end colostomy. We discussed that although can be reversed, likelihood of patient recovering well enough for reversal in future is low given her age, extent of deconditioning. We discussed that without surgery she would have uncontrolled infection and get sicker and more organ systems would start to fail. With surgery she will have a prolonged ICU course most likely, and need prolonged SNF after discharge. Would need lots of assistance/aid at home if she did get to that point.   Husband states she has already mentioned to him she has had enough and that she is ready to be done with interventions. Never discussed in more detail and this conversation was brief and in passing prior to extent of infection realized.  Husband and brother will discuss goals of care this evening and make decision tonight/tomorrow. Also discussed code status--no changes right now but they will also discuss amongst themselves. If family decides to continue aggressive care will need Hartman's procedure.  We will follow closely for decisions surrounding patient's care.   Donata Duff, MD [name]

## 2023-10-30 NOTE — Plan of Care (Signed)
   Problem: Coping: Goal: Ability to adjust to condition or change in health will improve Outcome: Progressing

## 2023-10-30 NOTE — Progress Notes (Addendum)
NAME:  Ashley Galloway, MRN:  [id number], DOB:  [birth year], LOS: 6 ADMISSION DATE:  10/23/2023, CONSULTATION DATE:  10/24/2023 REFERRING MD: Azucena Fallen, MD, CHIEF COMPLAINT: Septic shock  History of Present Illness:  A 74 y.o. female with HTN, dyslipidemia, PVD, COPD, tobacco smoking (quit few months ago), recent Streptococcus intermedius bacteremia, hepatic abscess (percutaneous drain d/c on 10/20/2022), and diverticulitis in Dec 2024 who completed antibiotics few days ago. She presented to ER 10/24/2023 in am with c/o LLQ swelling and erythema, and left thigh pain for few days. She has fever, chills, general malaise and loss of appetite, but denies chest pain, cough, SOB, wheezing, or N/V/D. She reports constipation.  New CT imaging finds new large LLQ abscess with associated diverticulitis as well as fluid/air into abductor muscle concerning for myositis.   Received 5L of LR boluses Started on IV abx: Vancomycin, Cefepime, and Flagyl--->Linezolid, Zosyn and Clindamycin. General surgery consulted and request IR, s/p perc drain to abdominal abscess. Developed Afib in ICU, new   Pertinent  Medical History  HTN, dyslipidemia, PVD, COPD, tobacco smoking (quit few months ago), recent Streptococcus intermedius bacteremia, hepatic abscess (percutaneous drain d/c on 10/20/2022), diverticulitis in Dec 2024   Significant Hospital Events: Including procedures, antibiotic start and stop dates in addition to other pertinent events   1/25 to OR for debridement of necrotizing fasciitis of LLE, IR placed LLQ drain for abscess  1/29 transferred to [facility] ICU in anticipation of repeat I/D of L leg on 1/31  Interim History / Subjective:  Feels well this morning. Denies pain, N/V, chills. No complaints at this time. Asked for help using the hospital phone.  Objective   Blood pressure (!) 112/52, pulse 95, temperature 100.2 F (37.9 C), resp. rate 20, height 5\' 5"  (1.651 m), weight 85 kg, SpO2  99%.        Intake/Output Summary (Last 24 hours) at 10/30/2023 0640 Last data filed at 10/30/2023 0500 Gross per 24 hour  Intake 2307.83 ml  Output 2550 ml  Net -242.17 ml   Filed Weights   10/23/23 2328 10/24/23 1415 10/29/23 0358  Weight: 71.3 kg 75.5 kg 85 kg    Examination: General: NAD resting in bed HENT: NCAT with moist mucous membranes Lungs: CTABL Cardiovascular: RRR no MRG Abdomen: Soft and without distension. JP drain with feculent material. Extremities: L leg wrapped in clean dressing - serosanguinous fluid in drain. LEs are perfused and without swelling. Neuro: AOx3  Resolved Hospital Problem list   AKI Acute metabolic encephalopathy  Shock Hypokalemia Hyponatremia  Assessment & Plan:  Sepsis due to intra-abdominal abscess/diverticulitis and left thigh necrotizing fascitis.  S/p CT guided drain placement 1/25 for abdominal abscess - multiorganism S/p excision and debridement of LLE with wound vac placement 1/25 Staph/strep Bacteremia Complicated by recent strep intermedius bacteremia, hepatic abscess (drain was d/c 10/21/2023), and chronic diverticulosis.  - General Surgery following. Considering plan for Hartman's procedure. A CT AP is pending. - Ortho following, plan for OR today for further evaluation of LLE - ID following, continue linzeolid, zosyn. Discontinued micafungin 1/28. - continue midodrine 5mg  TID   Anemia Hg 7 this am. Normocytic, progressive over 2 months, iron studies favor chronic disease and inflammation. S/p 1u PRBC yesterday and will give 2 more this am per surgery.   Volume overload - continue lasix 20mg  IV this morning - goal net even to -1L   New Afib due to sepsis 10/05/2023: Echo EF 60%, G1DD Optimize electroltes K>4, Mg>2 - back in  NSR   COPD - Duoneb PRN   HTN and dyslipidemia - Hold home meds   Moderate Protein calorie malnutrition - Dietitian consult - protein boost supplement, although poor oral intake and unable  to keep up with needs - tube feeds per cortrak. To resume after today's surgery.   Hypoglycemia Pre-DM (HbA1c 6.3%) - Continue D10+ 1/2NS - q4hr glucose checks   Elevated INR: recent IV and PO Abx and sepsis now - trend   Anxiety: well-controlled   Lumbar stenosis, no current complaints   Peripheral arterial disease, chronic  Best Practice (right click and "Reselect all SmartList Selections" daily)   Diet/type: NPO DVT prophylaxis prophylactic heparin  Pressure ulcer(s): N/A GI prophylaxis: H2B Lines: Central line Foley:  Yes, and it is still needed Code Status:  full code Last date of multidisciplinary goals of care discussion [husband updated at bedside 1/26 am]  Labs   CBC: Recent Labs  Lab 10/23/23 2337 10/24/23 0410 10/25/23 0346 10/26/23 0311 10/27/23 0559 10/28/23 0517 10/29/23 0346  WBC 17.7*   < > 6.6 10.1 12.3* 9.6 9.8  NEUTROABS 14.5*  --   --   --   --   --   --   HGB 14.8   < > 7.8* 7.0* 7.8* 7.3* 7.0*  HCT 45.9   < > 23.7* 20.9* 23.1* 22.8* 21.2*  MCV 86.4   < > 86.5 85.0 85.2 87.0 85.8  PLT 606*   < > 279 245 221 193 200   < > = values in this interval not displayed.    Basic Metabolic Panel: Recent Labs  Lab 10/25/23 2102 10/26/23 0311 10/27/23 0558 10/27/23 0559 10/28/23 0517 10/28/23 1707 10/29/23 0346 10/29/23 2259 10/30/23 0426  NA 136 133*  --  133* 135  --  132*  --   --   K 3.7 3.1*  --  3.0* 3.8  --  3.8  --   --   CL 104 102  --  99 100  --  95*  --   --   CO2 22 23  --  24 25  --  26  --   --   GLUCOSE 116* 138*  --  106* 104*  --  137*  --   --   BUN 12 11  --  12 11  --  5*  --   --   CREATININE 0.52 <0.30*  --  0.51 0.31*  --  0.51  --   --   CALCIUM 7.9* 7.8*  --  7.6* 8.3*  --  8.1*  --   --   MG  --  2.0 2.1  --   --  1.8 2.2 2.0 2.0  PHOS  --  2.5 2.3*  --   --  1.4* 3.0 1.9* 1.8*   GFR: Estimated Creatinine Clearance: 68.4 mL/min (by C-G formula based on SCr of 0.51 mg/dL). Recent Labs  Lab 10/24/23 0410  10/24/23 1323 10/25/23 0346 10/25/23 0918 10/26/23 0311 10/27/23 0559 10/28/23 0517 10/29/23 0346  WBC 11.2*  --  6.6  --  10.1 12.3* 9.6 9.8  LATICACIDVEN 5.9* 3.7* 3.1* 2.6*  --   --   --   --     Liver Function Tests: Recent Labs  Lab 10/23/23 2337 10/24/23 0410  AST 64* 51*  ALT 36 30  ALKPHOS 119 86  BILITOT 1.3* 0.7  PROT 8.0 5.8*  ALBUMIN 2.2* 1.6*   No results for input(s): "LIPASE", "AMYLASE" in the last  168 hours. No results for input(s): "AMMONIA" in the last 168 hours.  ABG    Component Value Date/Time   PHART 7.422 10/24/2023 2225   PCO2ART 33.0 10/24/2023 2225   PO2ART 194 (H) 10/24/2023 2225   HCO3 21.5 10/24/2023 2225   TCO2 22 10/24/2023 2225   ACIDBASEDEF 3.0 (H) 10/24/2023 2225   O2SAT 100 10/24/2023 2225     Coagulation Profile: Recent Labs  Lab 10/23/23 2337  INR 1.6*    Cardiac Enzymes: No results for input(s): "CKTOTAL", "CKMB", "CKMBINDEX", "TROPONINI" in the last 168 hours.  HbA1C: Hgb A1c MFr Bld  Date/Time Value Ref Range Status  10/24/2023 04:10 AM 6.3 (H) 4.8 - 5.6 % Final    Comment:    (NOTE) Pre diabetes:          5.7%-6.4%  Diabetes:              >6.4%  Glycemic control for   <7.0% adults with diabetes     CBG: Recent Labs  Lab 10/29/23 1110 10/29/23 1541 10/29/23 1928 10/29/23 2313 10/30/23 0313  GLUCAP 100* 107* 102* 160* 116*    Review of Systems:   Negative for pain, chills, N/V per above.  Past Medical History:  She,  has a past medical history of Centrilobular emphysema (HCC), Colon polyps, Diverticulitis, HLD (hyperlipidemia), TIA (transient ischemic attack), and Vesicointestinal fistula.   Surgical History:   Past Surgical History:  Procedure Laterality Date   CHOLECYSTECTOMY     I & D EXTREMITY Left 10/24/2023   Procedure: IRRIGATION AND DEBRIDEMENT EXTREMITY Left lower extremity;  Surgeon: Samson Frederic, MD;  Location: [facility] ORS;  Service: Orthopedics;  Laterality: Left;  1 and 1/2 large  wound sponge as well as a smaller wound sponge placed in upper thigh incision, 1 1/2 large wound sponge placed in lower left leg incision. Staples placed by surgeon in intermittent places to keep sponges inside and wound v   IR GUIDED DRAIN W CATHETER PLACEMENT  09/16/2023   PARTIAL COLECTOMY     Tubulovillous adenoma   SPINE SURGERY       Social History:   reports that she has been smoking cigarettes. She has been exposed to tobacco smoke. She has never used smokeless tobacco.   Family History:  Her family history is negative for Breast cancer.   Allergies Allergies  Allergen Reactions   Tape Other (See Comments)    Tape from hormone patch.     Home Medications  Prior to Admission medications   Medication Sig Start Date End Date Taking? Authorizing Provider  aspirin EC 81 MG tablet Take 81 mg by mouth daily. Swallow whole.   Yes [provider]  cefadroxil (DURICEF) 500 MG capsule Take 1,000 mg by mouth 2 (two) times daily.   Yes [provider]  KLOR-CON M20 20 MEQ tablet Take 20 mEq by mouth daily.   Yes [provider]  latanoprost (XALATAN) 0.005 % ophthalmic solution Place 1 drop into both eyes at bedtime.   Yes [provider]  lovastatin (MEVACOR) 20 MG tablet Take 20 mg by mouth daily.   Yes [provider]  methocarbamol (ROBAXIN) 500 MG tablet Take 500 mg by mouth daily as needed for muscle spasms. 10/18/23  Yes [provider]  Multiple Vitamins-Minerals (CENTRUM SILVER WOMEN 50+) TABS Take 1 tablet by mouth daily.   Yes [provider]  ondansetron (ZOFRAN-ODT) 4 MG disintegrating tablet Take 1 tablet (4 mg total) by mouth every 8 (eight) hours  as needed for nausea or vomiting. 10/19/23  Yes Kuppelweiser, Cassie L, RPH-CPP  Probiotic Product (PROBIOTIC PO) Take 1 capsule by mouth daily.   Yes [provider]  traMADol (ULTRAM) 50 MG tablet Take 50 mg by mouth 2 (two) times daily as needed for moderate  pain (pain score 4-6) or severe pain (pain score 7-10). 10/22/23  Yes [provider]  triamterene-hydrochlorothiazide (DYAZIDE) 37.5-25 MG capsule Take 1 capsule by mouth every morning.   Yes [provider]  ALPRAZolam (XANAX) 0.25 MG tablet Take 0.25 mg by mouth daily. Patient not taking: Reported on 10/24/2023 10/22/23   [provider]  cefTRIAXone (ROCEPHIN) 2 g injection Inject 2 g into the muscle daily. For 5 days starting 10/05/23 Patient not taking: Reported on 10/24/2023    [provider]  HYDROcodone-acetaminophen (NORCO/VICODIN) 5-325 MG tablet Take 1 tablet by mouth every 12 (twelve) hours. Patient not taking: Reported on 10/24/2023 10/23/23   [provider]  predniSONE (STERAPRED UNI-PAK 21 TAB) 10 MG (21) TBPK tablet Take 10 mg by mouth as directed. Patient not taking: Reported on 10/24/2023 10/22/23   [provider]  timolol (TIMOPTIC) 0.5 % ophthalmic solution Place 1 drop into both eyes 2 (two) times daily. Patient not taking: Reported on 10/24/2023 07/08/23   [provider]  traZODone (DESYREL) 50 MG tablet Take 1-2 tablets by mouth at bedtime. Patient not taking: Reported on 10/24/2023 04/23/23   [provider]     Critical care time: 35 mins

## 2023-10-30 NOTE — Progress Notes (Signed)
6 Days Post-Op   Subjective/Chief Complaint: Denies abdominal pain, febrile overnight  Objective: Vital signs in last 24 hours: Temp:  [98.4 F (36.9 C)-104 F (40 C)] 98.8 F (37.1 C) (01/31 0830) Pulse Rate:  [82-108] 85 (01/31 0830) Resp:  [16-31] 26 (01/31 0830) BP: (98-136)/(47-79) 110/55 (01/31 0830) SpO2:  [92 %-100 %] 100 % (01/31 0830) Last BM Date : 10/29/23  Intake/Output from previous day: 01/30 0701 - 01/31 0700 In: 2358.5 [P.O.:480; I.V.:29.3; Blood:348; NG/GT:701.9; IV Piggyback:799.3] Out: 3400 [Urine:2250; Drains:1150] Intake/Output this shift: No intake/output data recorded.  PE: General: pleasant female who is laying in bed in NAD Lungs: Respiratory effort nonlabored Abd: soft, ND, nontender, drain LLQ with minimal feculence in bag, 0 output over last 24h. MSK: LLE with upper thigh wrapped in ace   Lab Results:  Recent Labs    10/28/23 0517 10/29/23 0346  WBC 9.6 9.8  HGB 7.3* 7.0*  HCT 22.8* 21.2*  PLT 193 200   BMET Recent Labs    10/28/23 0517 10/29/23 0346  NA 135 132*  K 3.8 3.8  CL 100 95*  CO2 25 26  GLUCOSE 104* 137*  BUN 11 5*  CREATININE 0.31* 0.51  CALCIUM 8.3* 8.1*   PT/INR No results for input(s): "LABPROT", "INR" in the last 72 hours.  ABG No results for input(s): "PHART", "HCO3" in the last 72 hours.  Invalid input(s): "PCO2", "PO2"   Studies/Results: DG Abd 1 View Result Date: 10/28/2023 CLINICAL DATA:  [id number] Encounter for nasogastric (NG) tube placement [id number] EXAM: ABDOMEN - 1 VIEW COMPARISON:  None Available. FINDINGS: A weighted tip enteric feeding tube is present. The tip of the tube is in the region of the pylorus. No evidence of bowel obstruction. Surgical clips present in the right upper quadrant. Low lung volumes. IMPRESSION: The weighted tip of the enteric feeding tube is in the region of the pylorus. Electronically Signed   By: Malachy Moan M.D.   On: 10/28/2023 14:50      Anti-infectives: Anti-infectives (From admission, onward)    Start     Dose/Rate Route Frequency Ordered Stop   10/26/23 0400  vancomycin (VANCOREADY) IVPB 1500 mg/300 mL  Status:  Discontinued        1,500 mg 150 mL/hr over 120 Minutes Intravenous Every 24 hours 10/25/23 0725 10/26/23 0754   10/26/23 0002  ceFAZolin (ANCEF) IVPB 2g/100 mL premix        2 g 200 mL/hr over 30 Minutes Intravenous Every 6 hours 10/26/23 0002 10/26/23 0650   10/25/23 2200  linezolid (ZYVOX) IVPB 600 mg        600 mg 300 mL/hr over 60 Minutes Intravenous Every 12 hours 10/25/23 1634     10/25/23 1800  micafungin (MYCAMINE) 100 mg in sodium chloride 0.9 % 100 mL IVPB  Status:  Discontinued        100 mg 105 mL/hr over 1 Hours Intravenous Every 24 hours 10/25/23 1645 10/27/23 1332   10/25/23 0215  vancomycin (VANCOREADY) IVPB 1500 mg/300 mL        1,500 mg 150 mL/hr over 120 Minutes Intravenous  Once 10/25/23 0121 10/25/23 0358   10/24/23 1000  linezolid (ZYVOX) IVPB 600 mg  Status:  Discontinued        600 mg 300 mL/hr over 60 Minutes Intravenous Every 12 hours 10/24/23 0224 10/24/23 2026   10/24/23 0800  piperacillin-tazobactam (ZOSYN) IVPB 3.375 g        3.375 g 12.5 mL/hr over 240 Minutes Intravenous  Every 8 hours 10/24/23 0231     10/24/23 0600  clindamycin (CLEOCIN) IVPB 600 mg  Status:  Discontinued        600 mg 100 mL/hr over 30 Minutes Intravenous Every 8 hours 10/24/23 0224 10/25/23 1634   10/24/23 0230  piperacillin-tazobactam (ZOSYN) IVPB 3.375 g  Status:  Discontinued       Placed in "And" Linked Group   3.375 g 100 mL/hr over 30 Minutes Intravenous  Once 10/24/23 0224 10/24/23 0229   10/23/23 2345  ceFEPIme (MAXIPIME) 2 g in sodium chloride 0.9 % 100 mL IVPB        2 g 200 mL/hr over 30 Minutes Intravenous  Once 10/23/23 2337 10/24/23 0035   10/23/23 2345  metroNIDAZOLE (FLAGYL) IVPB 500 mg        500 mg 100 mL/hr over 60 Minutes Intravenous  Once 10/23/23 2337 10/24/23 0127    10/23/23 2345  vancomycin (VANCOCIN) IVPB 1000 mg/200 mL premix        1,000 mg 200 mL/hr over 60 Minutes Intravenous  Once 10/23/23 2337 10/24/23 0313       Assessment/Plan: 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 Linna Caprice 1/25  S/p IR drain of LLQ abd abscess 1/25  - CBC pending this AM - Continue LLQ drain - cultures from debridement with E. Coli, Klebsiella oxytoca - IR drain cx with Klebsiella oxytoca, resistance to amp, otherwise sensitive. - Monitor drain and continue IV abx per ID (changed to piptzo/linezolid/micafungin 1/26) - Agree with cortrak and Tfs for nutrition--held today for OR, resume post-op -controlled with drain for now, but need to consider next steps for patient as her drain appears most consistent with a fistula and clearly her diverticulitis is the etiology of current situation---repeat CT AP with PO and IV contrast today - Ortho to take to OR for LLE debridement this afternoon - Fevers could be related to LLE v abdomen or both, will continue to monitor and follow up OR findings today as well as CT imaging  FEN: FLD, Tfs--resume after OR ID: zosyn, linezolid VTE: hep subq   LOS: 6 days   I reviewed Consultant IR, ortho, ID notes, hospitalist notes, last 24 h vitals and pain scores, last 48 h intake and output, last 24 h labs and trends, and last 24 h imaging results.  This required moderate level of medical decision making.   Lysle Rubens, MD [facility] Surgery 10/30/2023, 8:53 AM Please see Amion for pager number during day hours 7:00am-4:30pm

## 2023-10-31 DIAGNOSIS — J95821 Acute postprocedural respiratory failure: Secondary | ICD-10-CM

## 2023-10-31 DIAGNOSIS — A419 Sepsis, unspecified organism: Secondary | ICD-10-CM | POA: Diagnosis not present

## 2023-10-31 DIAGNOSIS — R6521 Severe sepsis with septic shock: Secondary | ICD-10-CM | POA: Diagnosis not present

## 2023-10-31 DIAGNOSIS — M726 Necrotizing fasciitis: Secondary | ICD-10-CM | POA: Diagnosis not present

## 2023-10-31 LAB — TYPE AND SCREEN
ABO/RH(D): A POS
Antibody Screen: NEGATIVE
Unit division: 0
Unit division: 0
Unit division: 0
Unit division: 0

## 2023-10-31 LAB — BPAM RBC
Blood Product Expiration Date: 202502242359
Blood Product Expiration Date: 202502242359
Blood Product Expiration Date: 202502242359
Blood Product Expiration Date: 202502242359
ISSUE DATE / TIME: 202501301227
ISSUE DATE / TIME: 202501310854
ISSUE DATE / TIME: 202501311231
ISSUE DATE / TIME: 202501311232
Unit Type and Rh: 6200
Unit Type and Rh: 6200
Unit Type and Rh: 6200
Unit Type and Rh: 6200

## 2023-10-31 LAB — GLUCOSE, CAPILLARY
Glucose-Capillary: 129 mg/dL — ABNORMAL HIGH (ref 70–99)
Glucose-Capillary: 130 mg/dL — ABNORMAL HIGH (ref 70–99)
Glucose-Capillary: 68 mg/dL — ABNORMAL LOW (ref 70–99)
Glucose-Capillary: 88 mg/dL (ref 70–99)
Glucose-Capillary: 88 mg/dL (ref 70–99)
Glucose-Capillary: 90 mg/dL (ref 70–99)

## 2023-10-31 LAB — TRIGLYCERIDES: Triglycerides: 140 mg/dL (ref <150)

## 2023-10-31 MED ORDER — ORAL CARE MOUTH RINSE
15.0000 mL | OROMUCOSAL | Status: DC
  Administered 2023-10-31 – 2023-11-02 (×19): 15 mL via OROMUCOSAL

## 2023-10-31 MED ORDER — PROSOURCE TF20 ENFIT COMPATIBL EN LIQD
60.0000 mL | Freq: Every day | ENTERAL | Status: DC
  Administered 2023-10-31 – 2023-11-01 (×2): 60 mL
  Filled 2023-10-31 (×3): qty 60

## 2023-10-31 MED ORDER — ORAL CARE MOUTH RINSE: 15.0000 mL | OROMUCOSAL | Status: DC | PRN

## 2023-10-31 MED ORDER — OSMOLITE 1.5 CAL PO LIQD
1000.0000 mL | ORAL | Status: DC
  Administered 2023-10-31 – 2023-11-01 (×3): 1000 mL
  Filled 2023-10-31 (×4): qty 1000

## 2023-10-31 MED ORDER — FENTANYL BOLUS VIA INFUSION
30.0000 ug | INTRAVENOUS | Status: DC | PRN
  Administered 2023-10-31 – 2023-11-01 (×2): 30 ug via INTRAVENOUS

## 2023-10-31 MED ORDER — DEXMEDETOMIDINE HCL IN NACL 400 MCG/100ML IV SOLN: 0.0000 ug/kg/h | INTRAVENOUS | Status: DC

## 2023-10-31 NOTE — Progress Notes (Addendum)
NAME:  Ashley Galloway, MRN:  [id number], DOB:  10/08/1950, LOS: 7 ADMISSION DATE:  10/23/2023, CONSULTATION DATE:  10/24/2023 REFERRING MD: Azucena Fallen, MD, CHIEF COMPLAINT: Septic shock  History of Present Illness:  A 73 y.o. female with HTN, dyslipidemia, PVD, COPD, tobacco smoking (quit few months ago), recent Streptococcus intermedius bacteremia, hepatic abscess (percutaneous drain d/c on 10/20/2022), and diverticulitis in Dec 2024 who completed antibiotics few days ago. She presented to ER 10/24/2023 in am with c/o LLQ swelling and erythema, and left thigh pain for few days. She has fever, chills, general malaise and loss of appetite, but denies chest pain, cough, SOB, wheezing, or N/V/D. She reports constipation.  New CT imaging finds new large LLQ abscess with associated diverticulitis as well as fluid/air into abductor muscle concerning for myositis.   Received 5L of LR boluses Started on IV abx: Vancomycin, Cefepime, and Flagyl--->Linezolid, Zosyn and Clindamycin. General surgery consulted and request IR, s/p perc drain to abdominal abscess. Developed Afib in ICU, new   Pertinent  Medical History  HTN, dyslipidemia, PVD, COPD, tobacco smoking (quit few months ago), recent Streptococcus intermedius bacteremia, hepatic abscess (percutaneous drain d/c on 10/20/2022), diverticulitis in Dec 2024   Significant Hospital Events: Including procedures, antibiotic start and stop dates in addition to other pertinent events   1/25 to OR for debridement of necrotizing fasciitis of LLE, IR placed LLQ drain for abscess  1/29 transferred to [facility] ICU in anticipation of repeat I/D of L leg on 1/31  Interim History / Subjective:  Post-op day 1 of LLE debridement. OR cultures with GNR. Potential general surgery intervention for fecal diversion on Monday and hip disarticulation on Wed pending family decision.  Remains on phenyephrine   Minimal vent settings Objective   Blood pressure  (!) 93/58, pulse 70, temperature 99.3 F (37.4 C), temperature source Bladder, resp. rate 16, height 5\' 5"  (1.651 m), weight 84.4 kg, SpO2 98%.    Vent Mode: CPAP;PSV FiO2 (%):  [40 %] 40 % Set Rate:  [16 bmp] 16 bmp Vt Set:  [500 mL] 500 mL PEEP:  [5 cmH20] 5 cmH20 Pressure Support:  [8 cmH20] 8 cmH20 Plateau Pressure:  [15 cmH20-18 cmH20] 16 cmH20   Intake/Output Summary (Last 24 hours) at 10/31/2023 1218 Last data filed at 10/31/2023 0900 Gross per 24 hour  Intake 6127.56 ml  Output 2125 ml  Net 4002.56 ml   Filed Weights   10/24/23 1415 10/29/23 0358 10/31/23 0500  Weight: 75.5 kg 85 kg 84.4 kg   Physical Exam: General: Critically ill-appearing, no acute distress HENT: [place], AT, ETT in place Eyes: EOMI, no scleral icterus Respiratory: Clear to auscultation bilaterally.  No crackles, wheezing or rales Cardiovascular: RRR, -M/R/G, no JVD GI: BS+, soft, nontender Extremities: LLE wrapped, -Edema,-tenderness Neuro: PERRL, CNII-XII grossly intact   Resolved Hospital Problem list   AKI Acute metabolic encephalopathy  Shock Hypokalemia Hyponatremia  Assessment & Plan:  Septic shock due to intra-abdominal abscess/diverticulitis and left thigh necrotizing fascitis.  S/p CT guided drain placement 1/25 for abdominal abscess - multiorganism S/p excision and debridement of LLE with wound vac placement 1/25 Staph/strep Bacteremia Complicated by recent strep intermedius bacteremia, hepatic abscess (drain was d/c 10/21/2023), and chronic diverticulosis.  - General Surgery and Ortho following. Post-op day 1 of LLE debridement. OR cultures with GNR. Potential general surgery intervention for fecal diversion on Monday  - ID following, continue linzeolid, zosyn. Discontinued micafungin 1/28. - continue midodrine 5mg  TID   Anemia Hg 7 this  am. Normocytic, progressive over 2 months, iron studies favor chronic disease and inflammation. S/p 1u PRBC 1/30 and 2 additional units 1/31 per  surgery   New Afib due to sepsis 10/05/2023: Echo EF 60%, G1DD Optimize electroltes K>4, Mg>2 - back in NSR   Post vent dependence  COPD -Full vent support -LTVV, 4-8cc/kg IBW with goal Pplat<30 and DP<15 -SBT/WUA once sedation weaned. Potential extubation pending goals of care -VAP, PAD protocol - Duoneb PRN   HTN and dyslipidemia - Hold home meds   Moderate Protein calorie malnutrition - Dietitian consult - protein boost supplement, although poor oral intake and unable to keep up with needs - tube feeds per cortrak. To resume after today's surgery.   Hypoglycemia Pre-DM (HbA1c 6.3%) - Continue D10+ 1/2NS - q4hr glucose checks   Elevated INR: recent IV and PO Abx and sepsis now - trend   Anxiety: well-controlled   Lumbar stenosis, no current complaints   Peripheral arterial disease, chronic Possible focal thrombosis vs severe stenosis in the proximal right profunda femoral artery on CTA 1/25 -Will order and attempt [place] however visualization may be difficult with recent debridement. May have to defer -Holding on anticoagulation for now  Best Practice (right click and "Reselect all SmartList Selections" daily)   Diet/type: Tube feeding DVT prophylaxis prophylactic heparin  Pressure ulcer(s): N/A GI prophylaxis: H2B Lines: Central line Foley:  Yes, and it is still needed Code Status:  full code Last date of multidisciplinary goals of care discussion [husband updated at bedside 1/26 am]  Critical care time:    The patient is critically ill with multiple organ systems failure and requires high complexity decision making for assessment and support, frequent evaluation and titration of therapies, application of advanced monitoring technologies and extensive interpretation of multiple databases.  Independent Critical Care Time: 40 Minutes.   Mechele Collin, M.D. [facility] Pulmonary/Critical Care Medicine 10/31/2023 12:18 PM   Please see Amion for pager number to reach  on-call Pulmonary and Critical Care Team.

## 2023-10-31 NOTE — Progress Notes (Signed)
1 Day Post-Op   Subjective/Chief Complaint: On vent  sedated   Objective: Vital signs in last 24 hours: Temp:  [97.3 F (36.3 C)-99.9 F (37.7 C)] 99.3 F (37.4 C) (02/01 0500) Pulse Rate:  [64-90] 70 (02/01 0700) Resp:  [14-34] 16 (02/01 0700) BP: (67-123)/(46-60) 93/58 (01/31 2000) SpO2:  [97 %-100 %] 100 % (02/01 0700) Arterial Line BP: (80-167)/(37-60) 103/46 (02/01 0700) FiO2 (%):  [40 %] 40 % (02/01 0500) Weight:  [84.4 kg] 84.4 kg (02/01 0500) Last BM Date : 10/30/23  Intake/Output from previous day: 01/31 0701 - 02/01 0700 In: 5924.2 [I.V.:2083.4; Blood:1260; NG/GT:1150; IV Piggyback:1430.8] Out: 2875 [Urine:1125; Drains:750; Blood:1000] Intake/Output this shift: No intake/output data recorded.  Exam: Intubated Abdomen soft, non-distended Dressing in place to LLE  Lab Results:  Recent Labs    10/29/23 0346 10/30/23 0926 10/30/23 1429 10/30/23 1817  WBC 9.8 6.7  --   --   HGB 7.0* 8.9* 11.6* 9.9*  HCT 21.2* 26.3* 34.0* 29.0*  PLT 200 200  --   --    BMET Recent Labs    10/29/23 0346 10/30/23 0926 10/30/23 1429 10/30/23 1817  NA 132* 135 135 135  K 3.8 3.6 3.9 3.5  CL 95* 100  --   --   CO2 26 21*  --   --   GLUCOSE 137* 100*  --   --   BUN 5* 12  --   --   CREATININE 0.51 0.57  --   --   CALCIUM 8.1* 8.1*  --   --    PT/INR No results for input(s): "LABPROT", "INR" in the last 72 hours. ABG Recent Labs    10/30/23 1429 10/30/23 1817  PHART 7.327* 7.349*  HCO3 20.5 21.2    Studies/Results: No results found.  Anti-infectives: Anti-infectives (From admission, onward)    Start     Dose/Rate Route Frequency Ordered Stop   10/26/23 0400  vancomycin (VANCOREADY) IVPB 1500 mg/300 mL  Status:  Discontinued        1,500 mg 150 mL/hr over 120 Minutes Intravenous Every 24 hours 10/25/23 0725 10/26/23 0754   10/26/23 0002  ceFAZolin (ANCEF) IVPB 2g/100 mL premix        2 g 200 mL/hr over 30 Minutes Intravenous Every 6 hours 10/26/23 0002  10/26/23 0650   10/25/23 2200  linezolid (ZYVOX) IVPB 600 mg        600 mg 300 mL/hr over 60 Minutes Intravenous Every 12 hours 10/25/23 1634     10/25/23 1800  micafungin (MYCAMINE) 100 mg in sodium chloride 0.9 % 100 mL IVPB  Status:  Discontinued        100 mg 105 mL/hr over 1 Hours Intravenous Every 24 hours 10/25/23 1645 10/27/23 1332   10/25/23 0215  vancomycin (VANCOREADY) IVPB 1500 mg/300 mL        1,500 mg 150 mL/hr over 120 Minutes Intravenous  Once 10/25/23 0121 10/25/23 0358   10/24/23 1000  linezolid (ZYVOX) IVPB 600 mg  Status:  Discontinued        600 mg 300 mL/hr over 60 Minutes Intravenous Every 12 hours 10/24/23 0224 10/24/23 2026   10/24/23 0800  piperacillin-tazobactam (ZOSYN) IVPB 3.375 g        3.375 g 12.5 mL/hr over 240 Minutes Intravenous Every 8 hours 10/24/23 0231     10/24/23 0600  clindamycin (CLEOCIN) IVPB 600 mg  Status:  Discontinued        600 mg 100 mL/hr over 30 Minutes  Intravenous Every 8 hours 10/24/23 0224 10/25/23 1634   10/24/23 0230  piperacillin-tazobactam (ZOSYN) IVPB 3.375 g  Status:  Discontinued       Placed in "And" Linked Group   3.375 g 100 mL/hr over 30 Minutes Intravenous  Once 10/24/23 0224 10/24/23 0229   10/23/23 2345  ceFEPIme (MAXIPIME) 2 g in sodium chloride 0.9 % 100 mL IVPB        2 g 200 mL/hr over 30 Minutes Intravenous  Once 10/23/23 2337 10/24/23 0035   10/23/23 2345  metroNIDAZOLE (FLAGYL) IVPB 500 mg        500 mg 100 mL/hr over 60 Minutes Intravenous  Once 10/23/23 2337 10/24/23 0127   10/23/23 2345  vancomycin (VANCOCIN) IVPB 1000 mg/200 mL premix        1,000 mg 200 mL/hr over 60 Minutes Intravenous  Once 10/23/23 2337 10/24/23 0313       Assessment/Plan: 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 Linna Caprice 1/25   S/p IR drain of LLQ abd abscess 1/25   -findings from the OR discussed with Dr. Lajoyce Corners.  -Depending on the family decisions,  the patient will need fecal diversion.  A complete resection of the area of diverticulitis may be difficult.  She would either need an end colostomy or at least a diverting loop which may potentially be done laparoscopically if this was just a loop.  This is not need to be performed emergently this weekend and will likely be done by our acute care surgeon Monday or Tuesday.  I discussed this with the patient's family.  They are still making decisions regarding how to proceed including code status  -From a surgical standpoint, she can be extubated.  Complex medical decision making  Abigail Miyamoto MD [facility] Surgery 10/31/2023

## 2023-10-31 NOTE — Progress Notes (Signed)
Patient ID: Ashley Galloway, female   DOB: December 06, 1950, 72 y.o.   MRN: [id number] Patient is postoperative day 1 debridement of left lower extremity.  Patient had stool in the left leg from the thigh to the calf.  Operative cultures are showing gram-negative rods.  Patient's hemoglobin is stabilized at 9.9.  White cell count is decreased to 6.7.  Anticipate general surgery intervention on Monday and will plan for a hip disarticulation on Wednesday.

## 2023-10-31 NOTE — Progress Notes (Signed)
eLink Physician-Brief Progress Note Patient Name: Ashley Galloway DOB: Feb 01, 1951 MRN: [id number]   Date of Service  10/31/2023  HPI/Events of Note  73 y.o. female with HTN, dyslipidemia, PVD, COPD, tobacco smoking (quit few months ago), recent Streptococcus intermedius bacteremia, hepatic abscess (percutaneous drain d/c on 10/20/2022), and diverticulitis in Dec 2024 who completed antibiotics few days ago.   Intubated persistent agitation  eICU Interventions  Add as needed fentanyl boluses from bag   0400 -1 CBG noted to be above goal at 195.  Receiving tube feeds at goal rate.  If there is additional hyperglycemia with readings above 180, will initiate SSI  Intervention Category Minor Interventions: Agitation / anxiety - evaluation and management  [name] 10/31/2023, 9:41 PM

## 2023-10-31 NOTE — Plan of Care (Signed)
INTUBATED

## 2023-11-01 ENCOUNTER — Encounter (HOSPITAL_COMMUNITY): Payer: Medicare HMO

## 2023-11-01 ENCOUNTER — Inpatient Hospital Stay (HOSPITAL_COMMUNITY): Payer: Medicare HMO

## 2023-11-01 DIAGNOSIS — M726 Necrotizing fasciitis: Secondary | ICD-10-CM | POA: Diagnosis not present

## 2023-11-01 DIAGNOSIS — J95821 Acute postprocedural respiratory failure: Secondary | ICD-10-CM | POA: Diagnosis not present

## 2023-11-01 DIAGNOSIS — R6521 Severe sepsis with septic shock: Secondary | ICD-10-CM | POA: Diagnosis not present

## 2023-11-01 DIAGNOSIS — A419 Sepsis, unspecified organism: Secondary | ICD-10-CM | POA: Diagnosis not present

## 2023-11-01 LAB — MAGNESIUM
Magnesium: 1.9 mg/dL (ref 1.7–2.4)
Magnesium: 2 mg/dL (ref 1.7–2.4)

## 2023-11-01 LAB — GLUCOSE, CAPILLARY
Glucose-Capillary: 195 mg/dL — ABNORMAL HIGH (ref 70–99)
Glucose-Capillary: 162 mg/dL — ABNORMAL HIGH (ref 70–99)
Glucose-Capillary: 211 mg/dL — ABNORMAL HIGH (ref 70–99)
Glucose-Capillary: 119 mg/dL — ABNORMAL HIGH (ref 70–99)
Glucose-Capillary: 93 mg/dL (ref 70–99)
Glucose-Capillary: 134 mg/dL — ABNORMAL HIGH (ref 70–99)

## 2023-11-01 LAB — COMPREHENSIVE METABOLIC PANEL
ALT: 18 U/L (ref 0–44)
AST: 28 U/L (ref 15–41)
Albumin: <1.5 g/dL — ABNORMAL LOW (ref 3.5–5.0)
Alkaline Phosphatase: 105 U/L (ref 38–126)
Anion gap: 10 (ref 5–15)
BUN: 16 mg/dL (ref 8–23)
CO2: 21 mmol/L — ABNORMAL LOW (ref 22–32)
Calcium: 7.4 mg/dL — ABNORMAL LOW (ref 8.9–10.3)
Chloride: 101 mmol/L (ref 98–111)
Creatinine, Ser: 0.63 mg/dL (ref 0.44–1.00)
GFR, Estimated: >60 mL/min (ref >60)
Glucose, Bld: 260 mg/dL — ABNORMAL HIGH (ref 70–99)
Potassium: 3.5 mmol/L (ref 3.5–5.1)
Sodium: 132 mmol/L — ABNORMAL LOW (ref 135–145)
Total Bilirubin: 0.6 mg/dL (ref 0.0–1.2)
Total Protein: 4.5 g/dL — ABNORMAL LOW (ref 6.5–8.1)

## 2023-11-01 LAB — CBC
HCT: 25.2 % — ABNORMAL LOW (ref 36.0–46.0)
Hemoglobin: 8.5 g/dL — ABNORMAL LOW (ref 12.0–15.0)
MCH: 28.9 pg (ref 26.0–34.0)
MCHC: 33.7 g/dL (ref 30.0–36.0)
MCV: 85.7 fL (ref 80.0–100.0)
Platelets: 126 10*3/uL — ABNORMAL LOW (ref 150–400)
RBC: 2.94 MIL/uL — ABNORMAL LOW (ref 3.87–5.11)
RDW: 15.4 % (ref 11.5–15.5)
WBC: 7 10*3/uL (ref 4.0–10.5)
nRBC: 0.3 % — ABNORMAL HIGH (ref 0.0–0.2)

## 2023-11-01 LAB — PHOSPHORUS: Phosphorus: <1 mg/dL — CL (ref 2.5–4.6)

## 2023-11-01 MED ORDER — HEPARIN SODIUM (PORCINE) 5000 UNIT/ML IJ SOLN
5000.0000 [IU] | Freq: Three times a day (TID) | INTRAMUSCULAR | Status: DC
Start: 2023-11-01 — End: 2023-11-02
  Administered 2023-11-01 – 2023-11-02 (×3): 5000 [IU] via SUBCUTANEOUS
  Filled 2023-11-01 (×3): qty 1

## 2023-11-01 MED ORDER — IOHEXOL 12 MG/ML PO SOLN
500.0000 mL | ORAL | Status: AC
  Administered 2023-11-01 (×2): 500 mL via ORAL

## 2023-11-01 MED ORDER — POTASSIUM PHOSPHATES 15 MMOLE/5ML IV SOLN
45.0000 mmol | Freq: Once | INTRAVENOUS | Status: AC
  Administered 2023-11-01: 45 mmol via INTRAVENOUS
  Filled 2023-11-01: qty 15

## 2023-11-01 MED ORDER — IOHEXOL 350 MG/ML SOLN
75.0000 mL | Freq: Once | INTRAVENOUS | Status: AC | PRN
  Administered 2023-11-01: 75 mL via INTRAVENOUS

## 2023-11-01 MED ORDER — IOHEXOL 9 MG/ML PO SOLN
ORAL | Status: AC
  Filled 2023-11-01: qty 1000

## 2023-11-01 NOTE — Plan of Care (Signed)
 ongoing

## 2023-11-01 NOTE — Progress Notes (Signed)
PCCM Progress Note  CT A/P completed with findings per radiology report including enlarging collection of fecal material in sigmoid with drain in place. New extension of fecal matter and air superiorly along left iliac fossa and left psoas across midline into right retroperitoneum. Known necrotizing fasciitis in left thigh.  Discussed findings on phone with General Surgery. Findings are as expected with plan to proceed with surgery as long as patient and family wish to pursue. Clinically she is overall stable with low dose vasopressor support. Also extubated earlier today successfully.   Will continue to monitor closely in ICU with vasopressor support, antibiotics and anticipated surgery.

## 2023-11-01 NOTE — Procedures (Signed)
Extubation Procedure Note  Patient Details:   Name: Ashley Galloway DOB: [birth year] MRN: [id number]   Airway Documentation:    Vent end date: 11/01/23 Vent end time: 1035   Evaluation  O2 sats: stable throughout Complications: No apparent complications Patient did tolerate procedure well. Bilateral Breath Sounds: Clear   Yes  Pt extubated to 2L [place], pt tolerated well. Cuff leak present, no stridor noted, RN at bedside, CCM MD aware,  RT will monitor as needed.  Thornell Mule 11/01/2023, 10:46 AM

## 2023-11-01 NOTE — Progress Notes (Signed)
2 Days Post-Op   Subjective/Chief Complaint: Remains of the vent No acute changes   Objective: Vital signs in last 24 hours: Temp:  [98.4 F (36.9 C)-100.4 F (38 C)] 99.3 F (37.4 C) (02/02 0715) Pulse Rate:  [63-99] 70 (02/02 0645) Resp:  [8-22] 13 (02/02 0715) SpO2:  [87 %-100 %] 99 % (02/02 0645) Arterial Line BP: (104-158)/(33-70) 141/46 (02/02 0715) FiO2 (%):  [40 %] 40 % (02/02 0400) Weight:  [85.8 kg] 85.8 kg (02/02 0500) Last BM Date : 10/31/23  Intake/Output from previous day: 02/01 0701 - 02/02 0700 In: 2898.1 [I.V.:1072.1; NG/GT:1064.3; IV Piggyback:761.7] Out: 1175 [Urine:1175] Intake/Output this shift: No intake/output data recorded.  Exam: On vent Abdomen soft, questionable mild tenderness in lower abdomen  Lab Results:  Recent Labs    10/30/23 0926 10/30/23 1429 10/30/23 1817 11/01/23 0258  WBC 6.7  --   --  7.0  HGB 8.9*   < > 9.9* 8.5*  HCT 26.3*   < > 29.0* 25.2*  PLT 200  --   --  126*   < > = values in this interval not displayed.   BMET Recent Labs    10/30/23 0926 10/30/23 1429 10/30/23 1817 11/01/23 0258  NA 135   < > 135 132*  K 3.6   < > 3.5 3.5  CL 100  --   --  101  CO2 21*  --   --  21*  GLUCOSE 100*  --   --  260*  BUN 12  --   --  16  CREATININE 0.57  --   --  0.63  CALCIUM 8.1*  --   --  7.4*   < > = values in this interval not displayed.   PT/INR No results for input(s): "LABPROT", "INR" in the last 72 hours. ABG Recent Labs    10/30/23 1429 10/30/23 1817  PHART 7.327* 7.349*  HCO3 20.5 21.2    Studies/Results: No results found.  Anti-infectives: Anti-infectives (From admission, onward)    Start     Dose/Rate Route Frequency Ordered Stop   10/26/23 0400  vancomycin (VANCOREADY) IVPB 1500 mg/300 mL  Status:  Discontinued        1,500 mg 150 mL/hr over 120 Minutes Intravenous Every 24 hours 10/25/23 0725 10/26/23 0754   10/26/23 0002  ceFAZolin (ANCEF) IVPB 2g/100 mL premix        2 g 200 mL/hr over 30  Minutes Intravenous Every 6 hours 10/26/23 0002 10/26/23 0650   10/25/23 2200  linezolid (ZYVOX) IVPB 600 mg        600 mg 300 mL/hr over 60 Minutes Intravenous Every 12 hours 10/25/23 1634     10/25/23 1800  micafungin (MYCAMINE) 100 mg in sodium chloride 0.9 % 100 mL IVPB  Status:  Discontinued        100 mg 105 mL/hr over 1 Hours Intravenous Every 24 hours 10/25/23 1645 10/27/23 1332   10/25/23 0215  vancomycin (VANCOREADY) IVPB 1500 mg/300 mL        1,500 mg 150 mL/hr over 120 Minutes Intravenous  Once 10/25/23 0121 10/25/23 0358   10/24/23 1000  linezolid (ZYVOX) IVPB 600 mg  Status:  Discontinued        600 mg 300 mL/hr over 60 Minutes Intravenous Every 12 hours 10/24/23 0224 10/24/23 2026   10/24/23 0800  piperacillin-tazobactam (ZOSYN) IVPB 3.375 g        3.375 g 12.5 mL/hr over 240 Minutes Intravenous Every 8 hours 10/24/23 0231  10/24/23 0600  clindamycin (CLEOCIN) IVPB 600 mg  Status:  Discontinued        600 mg 100 mL/hr over 30 Minutes Intravenous Every 8 hours 10/24/23 0224 10/25/23 1634   10/24/23 0230  piperacillin-tazobactam (ZOSYN) IVPB 3.375 g  Status:  Discontinued       Placed in "And" Linked Group   3.375 g 100 mL/hr over 30 Minutes Intravenous  Once 10/24/23 0224 10/24/23 0229   10/23/23 2345  ceFEPIme (MAXIPIME) 2 g in sodium chloride 0.9 % 100 mL IVPB        2 g 200 mL/hr over 30 Minutes Intravenous  Once 10/23/23 2337 10/24/23 0035   10/23/23 2345  metroNIDAZOLE (FLAGYL) IVPB 500 mg        500 mg 100 mL/hr over 60 Minutes Intravenous  Once 10/23/23 2337 10/24/23 0127   10/23/23 2345  vancomycin (VANCOCIN) IVPB 1000 mg/200 mL premix        1,000 mg 200 mL/hr over 60 Minutes Intravenous  Once 10/23/23 2337 10/24/23 0313       Assessment/Plan: s/p 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 Linna Caprice 1/25   S/p IR drain of LLQ abd abscess 1/25   -Family currently agrees to proceed  this week to the OR for an ostomy in hopes of diverting stool from the wound.  Again, a resection of the colon in the area of concern may likely not be possible.  Dr. Dwain Sarna is our Acute Care/Emergency General Surgeon this week and will review the films and discuss the possible options with the family tomorrow with surgeon likely on Tuesday given the OR schedule tomorrow and the patient's complex situation.  Complex medical decision making   Abigail Miyamoto MD [facility] Surgery 11/01/2023

## 2023-11-01 NOTE — Progress Notes (Signed)
Critical results from Radiology after CT ABD with contrast. Radiology requests that results be reviewed  ASAP by attending Notified Dr Everardo All and Dr Lajoyce Corners  Dr Everardo All replied " Talked to Dr Dwain Sarna. Likely pursue surgery as long as family remains on board. No new orders for nursing"

## 2023-11-01 NOTE — Progress Notes (Signed)
NAME:  Ashley Galloway, MRN:  [id number], DOB:  07/18/1951, LOS: 8 ADMISSION DATE:  10/23/2023, CONSULTATION DATE:  10/24/2023 REFERRING MD: Azucena Fallen, MD, CHIEF COMPLAINT: Septic shock  History of Present Illness:  A 73 y.o. female with HTN, dyslipidemia, PVD, COPD, tobacco smoking (quit few months ago), recent Streptococcus intermedius bacteremia, hepatic abscess (percutaneous drain d/c on 10/20/2022), and diverticulitis in Dec 2024 who completed antibiotics few days ago. She presented to ER 10/24/2023 in am with c/o LLQ swelling and erythema, and left thigh pain for few days. She has fever, chills, general malaise and loss of appetite, but denies chest pain, cough, SOB, wheezing, or N/V/D. She reports constipation.  New CT imaging finds new large LLQ abscess with associated diverticulitis as well as fluid/air into abductor muscle concerning for myositis.   Received 5L of LR boluses Started on IV abx: Vancomycin, Cefepime, and Flagyl--->Linezolid, Zosyn and Clindamycin. General surgery consulted and request IR, s/p perc drain to abdominal abscess. Developed Afib in ICU, new   Pertinent  Medical History  HTN, dyslipidemia, PVD, COPD, tobacco smoking (quit few months ago), recent Streptococcus intermedius bacteremia, hepatic abscess (percutaneous drain d/c on 10/20/2022), diverticulitis in Dec 2024   Significant Hospital Events: Including procedures, antibiotic start and stop dates in addition to other pertinent events   1/25 to OR for debridement of necrotizing fasciitis of LLE, IR placed LLQ drain for abscess  1/29 transferred to [facility] ICU in anticipation of repeat I/D of L leg on 1/31 1/31 LLE debridement  Interim History / Subjective:  Post-op day 2 of LLE debridement. OR cultures with GNR. Potential general surgery intervention for fecal diversion on Tuesday and hip disarticulation on Wed pending surgery/family discussion.  Remains on low dose phenyephrine   Minimal  vent settings Objective   Blood pressure (!) 93/58, pulse 70, temperature 99.5 F (37.5 C), resp. rate 14, height 5\' 5"  (1.651 m), weight 85.8 kg, SpO2 100%.    Vent Mode: PRVC FiO2 (%):  [40 %] 40 % Set Rate:  [16 bmp] 16 bmp Vt Set:  [500 mL] 500 mL PEEP:  [5 cmH20] 5 cmH20 Pressure Support:  [8 cmH20] 8 cmH20 Plateau Pressure:  [15 cmH20-16 cmH20] 16 cmH20   Intake/Output Summary (Last 24 hours) at 11/01/2023 0855 Last data filed at 11/01/2023 0800 Gross per 24 hour  Intake 3003.41 ml  Output 1175 ml  Net 1828.41 ml   Filed Weights   10/29/23 0358 10/31/23 0500 11/01/23 0500  Weight: 85 kg 84.4 kg 85.8 kg   Physical Exam: General: Critically ill-appearing, drowsy, opens eyes HENT: [place], AT, ETT in place Eyes: EOMI, no scleral icterus Respiratory: Clear to auscultation bilaterally.  No crackles, wheezing or rales Cardiovascular: RRR, -M/R/G, no JVD GI: BS+, soft, nontender Extremities: LLE wrapped, -edema,-tenderness Neuro: Drowsy, opens eyes, CNII-XII grossly intact, +cough/gag, does not follow commands GU: Foley in place  Imaging, labs and test in EMR in the last 24 hours reviewed independently by me. Pertinent findings below: WBC 7.0  Hg 8.5  Resolved Hospital Problem list   AKI Acute metabolic encephalopathy  Shock Hypokalemia Hyponatremia  Assessment & Plan:  Septic shock due to intra-abdominal abscess/diverticulitis and left thigh necrotizing fascitis.  S/p CT guided drain placement 1/25 for abdominal abscess - multiorganism S/p excision and debridement of LLE with wound vac placement 1/25, debridement 1/31 Staph/strep Bacteremia Complicated by recent strep intermedius bacteremia, hepatic abscess (drain was d/c 10/21/2023), and chronic diverticulosis.  - General Surgery and Ortho following. Post-op day  2 of LLE debridement. OR cultures with E. Coli and Klebsiella oxytoca. Potential general surgery intervention for fecal diversion on Tuesday pending surgery/family  discussion - ID following, continue linzeolid, zosyn. Discontinued micafungin 1/28. - F/u final cultures  - continue midodrine 5mg  TID   Anemia Normocytic, progressive over 2 months, iron studies favor chronic disease and inflammation.  S/p 1u PRBC 1/30 and 2 additional units 1/31 per surgery - Trend - Transfuse for goal >7  New Afib due to sepsis 10/05/2023: Echo EF 60%, G1DD Optimize electroltes K>4, Mg>2 - back in NSR   Post op vent dependence  COPD -Full vent support -LTVV, 4-8cc/kg IBW with goal Pplat<30 and DP<15 -Wean sedation. Off propofol -SBT/WUA with plan to extubate if passes -VAP, PAD protocol - Duoneb PRN   HTN and dyslipidemia - Hold home meds   Moderate Protein calorie malnutrition - Dietitian consult - protein boost supplement, although poor oral intake and unable to keep up with needs - tube feeds per cortrak   Hypoglycemia Pre-DM (HbA1c 6.3%) - Continue D10+ 1/2NS - q4hr glucose checks   Elevated INR: recent IV and PO Abx and sepsis now - trend   Anxiety: well-controlled   Lumbar stenosis, no current complaints   Peripheral arterial disease, chronic Possible focal thrombosis vs severe stenosis in the proximal right profunda femoral artery on CTA 1/25 -Will order and attempt [place] however visualization may be difficult with recent debridement. May have to defer -Holding on systemic anticoagulation for now  Best Practice (right click and "Reselect all SmartList Selections" daily)   Diet/type: Tube feeding DVT prophylaxis prophylactic heparin  Pressure ulcer(s): N/A GI prophylaxis: H2B Lines: Central line Foley:  Yes, and it is still needed Code Status:  full code Last date of multidisciplinary goals of care discussion [husband updated at bedside 11/01/23] Full code. Wishes to continue conversations regarding options for surgery.   Critical care time:    The patient is critically ill with multiple organ systems failure and requires high  complexity decision making for assessment and support, frequent evaluation and titration of therapies, application of advanced monitoring technologies and extensive interpretation of multiple databases.  Independent Critical Care Time: 42 Minutes.   Mechele Collin, M.D. [facility] Pulmonary/Critical Care Medicine 11/01/2023 8:55 AM   Please see Amion for pager number to reach on-call Pulmonary and Critical Care Team.

## 2023-11-01 NOTE — Progress Notes (Signed)
ID brief note  T max 100.6, fever curve decreasing 1/31 debridement of left leg, calf, thigh and pelvis. OR findings: Patient had purulence from the bowel in her pelvis thigh and leg. This tissue was sent for cultures.      Latest Ref Rng & Units 11/01/2023    2:58 AM 10/30/2023    6:17 PM 10/30/2023    2:29 PM  CBC  WBC 4.0 - 10.5 K/uL 7.0     Hemoglobin 12.0 - 15.0 g/dL 8.5  9.9  40.9   Hematocrit 36.0 - 46.0 % 25.2  29.0  34.0   Platelets 150 - 400 K/uL 126         Latest Ref Rng & Units 11/01/2023    2:58 AM 10/30/2023    6:17 PM 10/30/2023    2:29 PM  CMP  Glucose 70 - 99 mg/dL 811     BUN 8 - 23 mg/dL 16     Creatinine 9.14 - 1.00 mg/dL 7.82     Sodium 956 - 213 mmol/L 132  135  135   Potassium 3.5 - 5.1 mmol/L 3.5  3.5  3.9   Chloride 98 - 111 mmol/L 101     CO2 22 - 32 mmol/L 21     Calcium 8.9 - 10.3 mg/dL 7.4     Total Protein 6.5 - 8.1 g/dL 4.5     Total Bilirubin 0.0 - 1.2 mg/dL 0.6     Alkaline Phos 38 - 126 U/L 105     AST 15 - 41 U/L 28     ALT 0 - 44 U/L 18      Results for orders placed or performed during the hospital encounter of 10/23/23  Resp panel by RT-PCR (RSV, Flu A&B, Covid) Anterior Nasal Swab     Status: None   Collection Time: 10/23/23 11:37 PM   Specimen: Anterior Nasal Swab  Result Value Ref Range Status   SARS Coronavirus 2 by RT PCR NEGATIVE NEGATIVE Final    Comment: (NOTE) SARS-CoV-2 target nucleic acids are NOT DETECTED.  The SARS-CoV-2 RNA is generally detectable in upper respiratory specimens during the acute phase of infection. The lowest concentration of SARS-CoV-2 viral copies this assay can detect is 138 copies/mL. A negative result does not preclude SARS-Cov-2 infection and should not be used as the sole basis for treatment or other patient management decisions. A negative result may occur with  improper specimen collection/handling, submission of specimen other than nasopharyngeal swab, presence of viral mutation(s) within  the areas targeted by this assay, and inadequate number of viral copies(<138 copies/mL). A negative result must be combined with clinical observations, patient history, and epidemiological information. The expected result is Negative.  Fact Sheet for Patients:  BloggerCourse.com  Fact Sheet for Healthcare Providers:  SeriousBroker.it  This test is no t yet approved or cleared by the [place] FDA and  has been authorized for detection and/or diagnosis of SARS-CoV-2 by FDA under an Emergency Use Authorization (EUA). This EUA will remain  in effect (meaning this test can be used) for the duration of the COVID-19 declaration under Section 564(b)(1) of the Act, 21 U.S.C.section 360bbb-3(b)(1), unless the authorization is terminated  or revoked sooner.       Influenza A by PCR NEGATIVE NEGATIVE Final   Influenza B by PCR NEGATIVE NEGATIVE Final    Comment: (NOTE) The Xpert Xpress SARS-CoV-2/FLU/RSV plus assay is intended as an aid in the diagnosis of influenza from Nasopharyngeal swab specimens and should  not be used as a sole basis for treatment. Nasal washings and aspirates are unacceptable for Xpert Xpress SARS-CoV-2/FLU/RSV testing.  Fact Sheet for Patients: BloggerCourse.com  Fact Sheet for Healthcare Providers: SeriousBroker.it  This test is not yet approved or cleared by the [place] FDA and has been authorized for detection and/or diagnosis of SARS-CoV-2 by FDA under an Emergency Use Authorization (EUA). This EUA will remain in effect (meaning this test can be used) for the duration of the COVID-19 declaration under Section 564(b)(1) of the Act, 21 U.S.C. section 360bbb-3(b)(1), unless the authorization is terminated or revoked.     Resp Syncytial Virus by PCR NEGATIVE NEGATIVE Final    Comment: (NOTE) Fact Sheet for  Patients: BloggerCourse.com  Fact Sheet for Healthcare Providers: SeriousBroker.it  This test is not yet approved or cleared by the [place] FDA and has been authorized for detection and/or diagnosis of SARS-CoV-2 by FDA under an Emergency Use Authorization (EUA). This EUA will remain in effect (meaning this test can be used) for the duration of the COVID-19 declaration under Section 564(b)(1) of the Act, 21 U.S.C. section 360bbb-3(b)(1), unless the authorization is terminated or revoked.  Performed at [facility], [address], [place], [place] [address]   Blood Culture (routine x 2)     Status: Abnormal   Collection Time: 10/23/23 11:37 PM   Specimen: BLOOD  Result Value Ref Range Status   Specimen Description   Final    BLOOD LEFT ANTECUBITAL Performed at [facility], [address] [place], [place] [address]    Special Requests   Final    BOTTLES DRAWN AEROBIC AND ANAEROBIC Blood Culture results may not be optimal due to an inadequate volume of blood received in culture bottles Performed at [facility], [address] [place], [place] [address]    Culture  Setup Time   Final    GRAM POSITIVE COCCI IN CLUSTERS AEROBIC BOTTLE ONLY CRITICAL RESULT CALLED TO, READ BACK BY AND VERIFIED WITH: PHARMD E JACKSON 10/24/2023 @ 2345 BY AB    Culture (A)  Final    STAPHYLOCOCCUS EPIDERMIDIS THE SIGNIFICANCE OF ISOLATING THIS ORGANISM FROM A SINGLE SET OF BLOOD CULTURES WHEN MULTIPLE SETS ARE DRAWN IS UNCERTAIN. PLEASE NOTIFY THE MICROBIOLOGY DEPARTMENT WITHIN ONE WEEK IF SPECIATION AND SENSITIVITIES ARE REQUIRED. Performed at [facility], [address], [place], [place] [address]    Report Status 10/25/2023 FINAL  Final  Blood Culture ID Panel (Reflexed)     Status: Abnormal   Collection Time: 10/23/23 11:37 PM  Result Value Ref Range Status   Enterococcus  faecalis NOT DETECTED NOT DETECTED Final   Enterococcus Faecium NOT DETECTED NOT DETECTED Final   Listeria monocytogenes NOT DETECTED NOT DETECTED Final   Staphylococcus species DETECTED (A) NOT DETECTED Final    Comment: CRITICAL RESULT CALLED TO, READ BACK BY AND VERIFIED WITH: PHARMD E JACKSON 10/24/2023 @ 2345 BY AB    Staphylococcus aureus (BCID) NOT DETECTED NOT DETECTED Final   Staphylococcus epidermidis DETECTED (A) NOT DETECTED Final    Comment: Methicillin (oxacillin) resistant coagulase negative staphylococcus. Possible blood culture contaminant (unless isolated from more than one blood culture draw or clinical case suggests pathogenicity). No antibiotic treatment is indicated for blood  culture contaminants. CRITICAL RESULT CALLED TO, READ BACK BY AND VERIFIED WITH: PHARMD E JACKSON 10/24/2023 @ 2345 BY AB    Staphylococcus lugdunensis NOT DETECTED NOT DETECTED Final   Streptococcus species NOT DETECTED NOT  DETECTED Final   Streptococcus agalactiae NOT DETECTED NOT DETECTED Final   Streptococcus pneumoniae NOT DETECTED NOT DETECTED Final   Streptococcus pyogenes NOT DETECTED NOT DETECTED Final   A.calcoaceticus-baumannii NOT DETECTED NOT DETECTED Final   Bacteroides fragilis NOT DETECTED NOT DETECTED Final   Enterobacterales NOT DETECTED NOT DETECTED Final   Enterobacter cloacae complex NOT DETECTED NOT DETECTED Final   Escherichia coli NOT DETECTED NOT DETECTED Final   Klebsiella aerogenes NOT DETECTED NOT DETECTED Final   Klebsiella oxytoca NOT DETECTED NOT DETECTED Final   Klebsiella pneumoniae NOT DETECTED NOT DETECTED Final   Proteus species NOT DETECTED NOT DETECTED Final   Salmonella species NOT DETECTED NOT DETECTED Final   Serratia marcescens NOT DETECTED NOT DETECTED Final   Haemophilus influenzae NOT DETECTED NOT DETECTED Final   Neisseria meningitidis NOT DETECTED NOT DETECTED Final   Pseudomonas aeruginosa NOT DETECTED NOT DETECTED Final   Stenotrophomonas  maltophilia NOT DETECTED NOT DETECTED Final   Candida albicans NOT DETECTED NOT DETECTED Final   Candida auris NOT DETECTED NOT DETECTED Final   Candida glabrata NOT DETECTED NOT DETECTED Final   Candida krusei NOT DETECTED NOT DETECTED Final   Candida parapsilosis NOT DETECTED NOT DETECTED Final   Candida tropicalis NOT DETECTED NOT DETECTED Final   Cryptococcus neoformans/gattii NOT DETECTED NOT DETECTED Final   Methicillin resistance mecA/C DETECTED (A) NOT DETECTED Final    Comment: CRITICAL RESULT CALLED TO, READ BACK BY AND VERIFIED WITH: PHARMD E JACKSON 10/24/2023 @ 2345 BY AB Performed at [facility], [address] [place] [address], [place], [place] [address]   Blood Culture (routine x 2)     Status: None   Collection Time: 10/24/23 12:41 AM   Specimen: BLOOD  Result Value Ref Range Status   Specimen Description   Final    BLOOD BLOOD RIGHT ARM Performed at [facility], [address] [place], [place] [address]    Special Requests   Final    BOTTLES DRAWN AEROBIC AND ANAEROBIC Blood Culture results may not be optimal due to an inadequate volume of blood received in culture bottles Performed at [facility], [address], [place], [place] [address]    Culture   Final    NO GROWTH 5 DAYS Performed at [facility], [address], [place], [place] [address]    Report Status 10/29/2023 FINAL  Final  MRSA Next Gen by PCR, Nasal     Status: None   Collection Time: 10/24/23  1:02 PM   Specimen: Nasal Mucosa; Nasal Swab  Result Value Ref Range Status   MRSA by PCR Next Gen NOT DETECTED NOT DETECTED Final    Comment: (NOTE) The GeneXpert MRSA Assay (FDA approved for NASAL specimens only), is one component of a comprehensive MRSA colonization surveillance program. It is not intended to diagnose MRSA infection nor to guide or monitor treatment for MRSA infections. Test performance is not FDA approved in patients less than 70  years old. Performed at [facility], [address], [place], [place] [address]   Aerobic/Anaerobic Culture w Gram Stain (surgical/deep wound)     Status: None   Collection Time: 10/24/23  4:22 PM   Specimen: Abscess  Result Value Ref Range Status   Specimen Description   Final    ABSCESS DRAIN Performed at [facility], [address] [place], [place] [address]    Special Requests   Final    NONE Performed at [facility], [address], [place], [place] [address]    Gram Stain   Final    FEW WBC PRESENT, PREDOMINANTLY PMN ABUNDANT GRAM POSITIVE COCCI MODERATE GRAM NEGATIVE RODS MODERATE GRAM POSITIVE RODS    Culture   Final    ABUNDANT ESCHERICHIA COLI ABUNDANT KLEBSIELLA OXYTOCA ABUNDANT ENTEROCOCCUS FAECIUM MODERATE PSEUDOMONAS AERUGINOSA SUSCEPTIBILITIES PERFORMED ON PREVIOUS CULTURE WITHIN THE LAST 5 DAYS. ABUNDANT PARABACTEROIDES DISTASONIS BETA LACTAMASE POSITIVE Performed at [facility], [address] [place], [place] [address]    Report Status 10/27/2023 FINAL  Final   Organism ID, Bacteria ENTEROCOCCUS FAECIUM  Final   Organism ID, Bacteria PSEUDOMONAS AERUGINOSA  Final      Susceptibility   Enterococcus faecium - MIC*    AMPICILLIN >=32 RESISTANT Resistant     VANCOMYCIN <=0.5 SENSITIVE Sensitive     GENTAMICIN SYNERGY SENSITIVE Sensitive     * ABUNDANT ENTEROCOCCUS FAECIUM   Pseudomonas aeruginosa - MIC*    CEFTAZIDIME <=1 SENSITIVE Sensitive     CIPROFLOXACIN <=0.25 SENSITIVE Sensitive     GENTAMICIN <=1 SENSITIVE Sensitive     IMIPENEM 0.5 SENSITIVE Sensitive     PIP/TAZO <=4 SENSITIVE Sensitive ug/mL    CEFEPIME 0.25 SENSITIVE Sensitive     * MODERATE PSEUDOMONAS AERUGINOSA  Aerobic/Anaerobic Culture w Gram Stain (surgical/deep wound)     Status: None   Collection Time: 10/24/23  9:44 PM   Specimen: Abscess; Wound  Result Value Ref Range Status   Specimen Description   Final     ABSCESS Performed at [facility], [address], [place], [place] [address]    Special Requests   Final    ABSCESS Performed at [facility], [address] [place], [place] [address]    Gram Stain   Final    FEW WBC PRESENT, PREDOMINANTLY PMN MODERATE GRAM NEGATIVE RODS MODERATE GRAM POSITIVE COCCI    Culture   Final    MODERATE KLEBSIELLA OXYTOCA FEW PSEUDOMONAS AERUGINOSA FEW ENTEROCOCCUS FAECIUM SUSCEPTIBILITIES PERFORMED ON PREVIOUS CULTURE WITHIN THE LAST 5 DAYS. MODERATE BACTEROIDES THETAIOTAOMICRON BETA LACTAMASE POSITIVE Performed at [facility], [address] [place], [place] [address]    Report Status 10/28/2023 FINAL  Final   Organism ID, Bacteria KLEBSIELLA OXYTOCA  Final      Susceptibility   Klebsiella oxytoca - MIC*    AMPICILLIN RESISTANT Resistant     CEFEPIME <=0.12 SENSITIVE Sensitive     CEFTAZIDIME <=1 SENSITIVE Sensitive     CEFTRIAXONE <=0.25 SENSITIVE Sensitive     CIPROFLOXACIN <=0.25 SENSITIVE Sensitive     GENTAMICIN <=1 SENSITIVE Sensitive     IMIPENEM <=0.25 SENSITIVE Sensitive     TRIMETH/SULFA <=20 SENSITIVE Sensitive     AMPICILLIN/SULBACTAM <=2 SENSITIVE Sensitive     PIP/TAZO <=4 SENSITIVE Sensitive ug/mL    * MODERATE KLEBSIELLA OXYTOCA  Aerobic/Anaerobic Culture w Gram Stain (surgical/deep wound)     Status: None   Collection Time: 10/24/23  9:47 PM   Specimen: Path Tissue  Result Value Ref Range Status   Specimen Description   Final    TISSUE Performed at [facility], [address], [place], [place] [address]    Special Requests   Final    TISSUE Performed at [facility], [address] [place], [place] [address]    Gram Stain   Final    RARE WBC PRESENT, PREDOMINANTLY PMN FEW GRAM POSITIVE COCCI RARE GRAM NEGATIVE RODS  Culture   Final    MODERATE ESCHERICHIA COLI MODERATE KLEBSIELLA OXYTOCA MODERATE BACTEROIDES OVATUS BETA  LACTAMASE POSITIVE Performed at [facility], [address] [place], [place] [address]    Report Status 10/29/2023 FINAL  Final   Organism ID, Bacteria ESCHERICHIA COLI  Final   Organism ID, Bacteria KLEBSIELLA OXYTOCA  Final      Susceptibility   Escherichia coli - MIC*    AMPICILLIN >=32 RESISTANT Resistant     CEFEPIME <=0.12 SENSITIVE Sensitive     CEFTAZIDIME <=1 SENSITIVE Sensitive     CEFTRIAXONE <=0.25 SENSITIVE Sensitive     CIPROFLOXACIN <=0.25 SENSITIVE Sensitive     GENTAMICIN <=1 SENSITIVE Sensitive     IMIPENEM <=0.25 SENSITIVE Sensitive     TRIMETH/SULFA <=20 SENSITIVE Sensitive     AMPICILLIN/SULBACTAM 8 SENSITIVE Sensitive     PIP/TAZO <=4 SENSITIVE Sensitive ug/mL    * MODERATE ESCHERICHIA COLI   Klebsiella oxytoca - MIC*    AMPICILLIN RESISTANT Resistant     CEFEPIME <=0.12 SENSITIVE Sensitive     CEFTAZIDIME <=1 SENSITIVE Sensitive     CEFTRIAXONE <=0.25 SENSITIVE Sensitive     CIPROFLOXACIN <=0.25 SENSITIVE Sensitive     GENTAMICIN <=1 SENSITIVE Sensitive     IMIPENEM <=0.25 SENSITIVE Sensitive     TRIMETH/SULFA <=20 SENSITIVE Sensitive     AMPICILLIN/SULBACTAM <=2 SENSITIVE Sensitive     PIP/TAZO <=4 SENSITIVE Sensitive ug/mL    * MODERATE KLEBSIELLA OXYTOCA  Culture, blood (Routine X 2) w Reflex to ID Panel     Status: None (Preliminary result)   Collection Time: 10/30/23  9:27 AM   Specimen: BLOOD LEFT HAND  Result Value Ref Range Status   Specimen Description BLOOD LEFT HAND  Final   Special Requests   Final    BOTTLES DRAWN AEROBIC AND ANAEROBIC Blood Culture results may not be optimal due to an inadequate volume of blood received in culture bottles   Culture   Final    NO GROWTH 2 DAYS Performed at [facility], [address], [place], [place] [address]    Report Status PENDING  Incomplete  Culture, blood (Routine X 2) w Reflex to ID Panel     Status: None (Preliminary result)   Collection Time: 10/30/23  9:28 AM   Specimen: BLOOD  LEFT HAND  Result Value Ref Range Status   Specimen Description BLOOD LEFT HAND  Final   Special Requests   Final    BOTTLES DRAWN AEROBIC AND ANAEROBIC Blood Culture results may not be optimal due to an inadequate volume of blood received in culture bottles   Culture   Final    NO GROWTH 2 DAYS Performed at [facility], [address], [place], [place] [address]    Report Status PENDING  Incomplete  Aerobic/Anaerobic Culture w Gram Stain (surgical/deep wound)     Status: None (Preliminary result)   Collection Time: 10/30/23  2:05 PM   Specimen: Path Tissue  Result Value Ref Range Status   Specimen Description TISSUE  Final   Special Requests LEFT MEDIAL THIGH WOUND  Final   Gram Stain NO WBC SEEN FEW GRAM NEGATIVE RODS   Final   Culture MODERATE KLEBSIELLA OXYTOCA  Final   Report Status PENDING  Incomplete   Organism ID, Bacteria KLEBSIELLA OXYTOCA  Final      Susceptibility   Klebsiella oxytoca - MIC*    AMPICILLIN >=32 RESISTANT Resistant     CEFEPIME <=0.12 SENSITIVE Sensitive  CEFTAZIDIME <=1 SENSITIVE Sensitive     CEFTRIAXONE <=0.25 SENSITIVE Sensitive     CIPROFLOXACIN <=0.25 SENSITIVE Sensitive     GENTAMICIN <=1 SENSITIVE Sensitive     IMIPENEM <=0.25 SENSITIVE Sensitive     TRIMETH/SULFA <=20 SENSITIVE Sensitive     AMPICILLIN/SULBACTAM 8 SENSITIVE Sensitive     PIP/TAZO Value in next row Sensitive ug/mL     16 SENSITIVEPerformed at [facility], [address] [place], [place] [address]    * MODERATE KLEBSIELLA OXYTOCA   On linezolid and zosyn Plan for surgical intervention on Tuesday and orthopedic intervention on Wednesday noted pending discussion with family Will fu intermittently while having source control. Call with active questions or concerns   Odette Fraction, MD Infectious Disease Physician [facility] [address] [place], [place] [address] Phone: [phone]  Fax:  [phone]

## 2023-11-01 NOTE — Progress Notes (Signed)
Patient ID: Ashley Galloway, female   DOB: Aug 02, 1951, 73 y.o.   MRN: [id number] I have reviewed all images/notes etc.  It does appear that for any chance of improvement she will need at least fecal diversion. This is not low risk. She was just extubated right now.  She does not appear to have any abdominal scars.  I don't think that she can tolerate a resection of this area at this point and may be quite difficult. The main issue is diverting fecal stream. I think that best option is to do lap assisted loop colostomy which may end up being transverse with the risks that are involved with that.    I have calculated the risk via ACS NSQIP as well as the POTTER EGS risk calculator. By the POTTER the mortality risk at 30 days exceeds 50%. I will attach the NSQIP below.  I have discussed this with her husband and family today. They wish to proceed and I will try to do this tomorrow.

## 2023-11-02 ENCOUNTER — Encounter (HOSPITAL_COMMUNITY): Payer: Self-pay | Admitting: Orthopedic Surgery

## 2023-11-02 ENCOUNTER — Inpatient Hospital Stay (HOSPITAL_COMMUNITY): Payer: Medicare HMO

## 2023-11-02 DIAGNOSIS — B999 Unspecified infectious disease: Secondary | ICD-10-CM

## 2023-11-02 DIAGNOSIS — K5720 Diverticulitis of large intestine with perforation and abscess without bleeding: Secondary | ICD-10-CM | POA: Diagnosis not present

## 2023-11-02 DIAGNOSIS — R6521 Severe sepsis with septic shock: Secondary | ICD-10-CM | POA: Diagnosis not present

## 2023-11-02 DIAGNOSIS — A419 Sepsis, unspecified organism: Secondary | ICD-10-CM | POA: Diagnosis not present

## 2023-11-02 LAB — COMPREHENSIVE METABOLIC PANEL
ALT: 20 U/L (ref 0–44)
AST: 31 U/L (ref 15–41)
Albumin: <1.5 g/dL — ABNORMAL LOW (ref 3.5–5.0)
Alkaline Phosphatase: 154 U/L — ABNORMAL HIGH (ref 38–126)
Anion gap: 13 (ref 5–15)
BUN: 12 mg/dL (ref 8–23)
CO2: 21 mmol/L — ABNORMAL LOW (ref 22–32)
Calcium: 7.5 mg/dL — ABNORMAL LOW (ref 8.9–10.3)
Chloride: 97 mmol/L — ABNORMAL LOW (ref 98–111)
Creatinine, Ser: 0.47 mg/dL (ref 0.44–1.00)
GFR, Estimated: >60 mL/min (ref >60)
Glucose, Bld: 141 mg/dL — ABNORMAL HIGH (ref 70–99)
Potassium: 4.5 mmol/L (ref 3.5–5.1)
Sodium: 131 mmol/L — ABNORMAL LOW (ref 135–145)
Total Bilirubin: 0.3 mg/dL (ref 0.0–1.2)
Total Protein: 4.9 g/dL — ABNORMAL LOW (ref 6.5–8.1)

## 2023-11-02 LAB — PROTIME-INR
INR: 1.2 (ref 0.8–1.2)
Prothrombin Time: 15 s (ref 11.4–15.2)

## 2023-11-02 LAB — MAGNESIUM: Magnesium: 2 mg/dL (ref 1.7–2.4)

## 2023-11-02 LAB — PHOSPHORUS: Phosphorus: 3.6 mg/dL (ref 2.5–4.6)

## 2023-11-02 LAB — GLUCOSE, CAPILLARY
Glucose-Capillary: 94 mg/dL (ref 70–99)
Glucose-Capillary: 151 mg/dL — ABNORMAL HIGH (ref 70–99)

## 2023-11-02 MED ORDER — ACETAMINOPHEN 160 MG/5ML PO SOLN: 650.0000 mg | Freq: Four times a day (QID) | ORAL | Status: DC | PRN

## 2023-11-02 MED ORDER — VITAMIN C 500 MG PO TABS
500.0000 mg | ORAL_TABLET | Freq: Two times a day (BID) | ORAL | Status: DC
  Filled 2023-11-02: qty 1

## 2023-11-02 MED ORDER — METRONIDAZOLE 500 MG/100ML IV SOLN: 500.0000 mg | Freq: Two times a day (BID) | INTRAVENOUS | Status: DC

## 2023-11-02 MED ORDER — ACETAMINOPHEN 650 MG RE SUPP: 650.0000 mg | Freq: Four times a day (QID) | RECTAL | Status: DC | PRN

## 2023-11-02 MED ORDER — GLYCOPYRROLATE 1 MG PO TABS: 1.0000 mg | ORAL_TABLET | ORAL | Status: DC | PRN

## 2023-11-02 MED ORDER — ACETAMINOPHEN 160 MG/5ML PO SOLN
650.0000 mg | Freq: Four times a day (QID) | ORAL | Status: DC | PRN
  Administered 2023-11-02: 650 mg
  Filled 2023-11-02: qty 20.3

## 2023-11-02 MED ORDER — POLYVINYL ALCOHOL 1.4 % OP SOLN: 1.0000 [drp] | Freq: Four times a day (QID) | OPHTHALMIC | Status: DC | PRN

## 2023-11-02 MED ORDER — ACETAMINOPHEN 160 MG/5ML PO SOLN
650.0000 mg | Freq: Four times a day (QID) | ORAL | Status: DC | PRN
  Filled 2023-11-02: qty 20.3

## 2023-11-02 MED ORDER — ACETAMINOPHEN 325 MG PO TABS: 650.0000 mg | ORAL_TABLET | Freq: Four times a day (QID) | ORAL | Status: DC | PRN

## 2023-11-02 MED ORDER — SODIUM CHLORIDE 0.9 % IV SOLN: 2.0000 g | Freq: Three times a day (TID) | INTRAVENOUS | Status: DC

## 2023-11-02 MED ORDER — GLYCOPYRROLATE 0.2 MG/ML IJ SOLN
0.2000 mg | INTRAMUSCULAR | Status: DC | PRN
  Filled 2023-11-02: qty 1

## 2023-11-02 MED ORDER — GLYCOPYRROLATE 0.2 MG/ML IJ SOLN
0.2000 mg | INTRAMUSCULAR | Status: DC | PRN
  Administered 2023-11-02: 0.2 mg via INTRAVENOUS

## 2023-11-02 NOTE — Progress Notes (Signed)
Patient ID: Ashley Galloway, female   DOB: [birth year], 73 y.o.   MRN: [id number] Patient is seen in follow-up status post debridement left lower extremity.  Anticipate general surgery intervention today.  Cultures from her leg are showing Klebsiella.  Depending on patient's stability we will proceed with a hip disarticulation on Wednesday or Friday.

## 2023-11-02 NOTE — Hospital Course (Addendum)
   NRICHMENT  Neuro Resp Infection Cardiovascular Heme Metabolic/Renal Endocrine Nutrition Tubes/Lines   LLQ abscess with associated diverticulitis as well as fluid/air into abductor muscle concerning for necrotizing fasciitis.    NRICHMENT  Neuro Resp AHRF. Off vent. Hx COPD. 3L [place]. Duonebs prn.  Infection - CT yesterday extension of fecal matter and air across midline Staph/Strep bacteremia + now negative blood cultures x 3 days. Necrotizing fasciitis: OR cultures with E. Coli and Klebsiella oxytoca  - linezolid, zosyn  BP support midodrine 5 TID - Per surgery: lap assisted loop colostomy today  Cardiovascular Afib Volume overload, resume home lasix 20  Heme Anemia  Metabolic/Renal Endocrine Pre DM  Nutrition Tube feeds (stopped now surg)  Tubes/Lines Cortrack L internal jugular triple lumen catheter A line radial Urinary catheter Wound drain  NG/OG

## 2023-11-02 NOTE — Progress Notes (Signed)
[facility]  Date of Admission:  10/23/2023     Total days of antibiotics 11         ASSESSMENT:  Ashley Galloway continues to remain febrile in the setting of necrotizing soft tissue infection and she and family informed the ID team today of her wishes for no additional surgery or treatment at this point. Per IPAL note from PCCM the decision has been made to transition to hospice care. Antibiotics have been stopped and ID will sign off.  PLAN:  Transition to comfort care/hospice per PCCM.    Principal Problem:   Diverticulitis of large intestine with abscess Active Problems:   Infective myositis of left thigh   AKI (acute kidney injury) (HCC)   PAD (peripheral artery disease) (HCC)   Hyperglycemia   Protein-calorie malnutrition, severe   Necrotizing soft tissue infection   Necrotizing fasciitis (HCC)   Polymicrobial bacterial infection   Gas gangrene of thigh (HCC)   Necrotizing fasciitis of lower leg (HCC)    Chlorhexidine Gluconate Cloth  6 each Topical Daily   mouth rinse  15 mL Mouth Rinse Q2H    SUBJECTIVE:  Febrile overnight with no acute events. Family met with [place] upon entering room indicating that she does not wish to pursue any additional surgery or interventions.   Allergies  Allergen Reactions   Tape Other (See Comments)    Tape from hormone patch.     Review of Systems: Review of Systems  Unable to perform ROS: Other  Declined    OBJECTIVE: Vitals:   11/02/23 1000 11/02/23 1100 11/02/23 1200 11/02/23 1300  BP: (!) 116/56 (!) 112/51 (!) 113/43 (!) 101/58  Pulse: (!) 104 (!) 103 (!) 101 (!) 103  Resp: (!) 33 (!) 34 (!) 26 (!) 27  Temp: 100 F (37.8 C) 99.5 F (37.5 C) 100.2 F (37.9 C) (!) 100.6 F (38.1 C)  TempSrc:      SpO2: 100% 100% 100% 100%  Weight:      Height:       Body mass index is 31.48 kg/m.  Physical Exam Declined  Lab Results Lab Results  Component Value Date   WBC 7.0 11/01/2023    HGB 8.5 (L) 11/01/2023   HCT 25.2 (L) 11/01/2023   MCV 85.7 11/01/2023   PLT 126 (L) 11/01/2023    Lab Results  Component Value Date   CREATININE 0.47 11/02/2023   BUN 12 11/02/2023   NA 131 (L) 11/02/2023   K 4.5 11/02/2023   CL 97 (L) 11/02/2023   CO2 21 (L) 11/02/2023    Lab Results  Component Value Date   ALT 20 11/02/2023   AST 31 11/02/2023   ALKPHOS 154 (H) 11/02/2023   BILITOT 0.3 11/02/2023     Microbiology: Recent Results (from the past 240 hours)  Resp panel by RT-PCR (RSV, Flu A&B, Covid) Anterior Nasal Swab     Status: None   Collection Time: 10/23/23 11:37 PM   Specimen: Anterior Nasal Swab  Result Value Ref Range Status   SARS Coronavirus 2 by RT PCR NEGATIVE NEGATIVE Final    Comment: (NOTE) SARS-CoV-2 target nucleic acids are NOT DETECTED.  The SARS-CoV-2 RNA is generally detectable in upper respiratory specimens during the acute phase of infection. The lowest concentration of SARS-CoV-2 viral copies this assay can detect is 138 copies/mL. A negative result does not preclude SARS-Cov-2 infection and should not be used as the sole basis for treatment or other patient management  decisions. A negative result may occur with  improper specimen collection/handling, submission of specimen other than nasopharyngeal swab, presence of viral mutation(s) within the areas targeted by this assay, and inadequate number of viral copies(<138 copies/mL). A negative result must be combined with clinical observations, patient history, and epidemiological information. The expected result is Negative.  Fact Sheet for Patients:  BloggerCourse.com  Fact Sheet for Healthcare Providers:  SeriousBroker.it  This test is no t yet approved or cleared by the [place] FDA and  has been authorized for detection and/or diagnosis of SARS-CoV-2 by FDA under an Emergency Use Authorization (EUA). This EUA will remain  in effect  (meaning this test can be used) for the duration of the COVID-19 declaration under Section 564(b)(1) of the Act, 21 U.S.C.section 360bbb-3(b)(1), unless the authorization is terminated  or revoked sooner.       Influenza A by PCR NEGATIVE NEGATIVE Final   Influenza B by PCR NEGATIVE NEGATIVE Final    Comment: (NOTE) The Xpert Xpress SARS-CoV-2/FLU/RSV plus assay is intended as an aid in the diagnosis of influenza from Nasopharyngeal swab specimens and should not be used as a sole basis for treatment. Nasal washings and aspirates are unacceptable for Xpert Xpress SARS-CoV-2/FLU/RSV testing.  Fact Sheet for Patients: BloggerCourse.com  Fact Sheet for Healthcare Providers: SeriousBroker.it  This test is not yet approved or cleared by the [place] FDA and has been authorized for detection and/or diagnosis of SARS-CoV-2 by FDA under an Emergency Use Authorization (EUA). This EUA will remain in effect (meaning this test can be used) for the duration of the COVID-19 declaration under Section 564(b)(1) of the Act, 21 U.S.C. section 360bbb-3(b)(1), unless the authorization is terminated or revoked.     Resp Syncytial Virus by PCR NEGATIVE NEGATIVE Final    Comment: (NOTE) Fact Sheet for Patients: BloggerCourse.com  Fact Sheet for Healthcare Providers: SeriousBroker.it  This test is not yet approved or cleared by the [place] FDA and has been authorized for detection and/or diagnosis of SARS-CoV-2 by FDA under an Emergency Use Authorization (EUA). This EUA will remain in effect (meaning this test can be used) for the duration of the COVID-19 declaration under Section 564(b)(1) of the Act, 21 U.S.C. section 360bbb-3(b)(1), unless the authorization is terminated or revoked.  Performed at [facility], [address], [place], [place] [address]   Blood  Culture (routine x 2)     Status: Abnormal   Collection Time: 10/23/23 11:37 PM   Specimen: BLOOD  Result Value Ref Range Status   Specimen Description   Final    BLOOD LEFT ANTECUBITAL Performed at [facility], [address], [place], [place] [address]    Special Requests   Final    BOTTLES DRAWN AEROBIC AND ANAEROBIC Blood Culture results may not be optimal due to an inadequate volume of blood received in culture bottles Performed at [facility], [address], [place], [place] [address]    Culture  Setup Time   Final    GRAM POSITIVE COCCI IN CLUSTERS AEROBIC BOTTLE ONLY CRITICAL RESULT CALLED TO, READ BACK BY AND VERIFIED WITH: PHARMD E JACKSON 10/24/2023 @ 2345 BY AB    Culture (A)  Final    STAPHYLOCOCCUS EPIDERMIDIS THE SIGNIFICANCE OF ISOLATING THIS ORGANISM FROM A SINGLE SET OF BLOOD CULTURES WHEN MULTIPLE SETS ARE DRAWN IS UNCERTAIN. PLEASE NOTIFY THE MICROBIOLOGY DEPARTMENT WITHIN ONE WEEK IF SPECIATION AND SENSITIVITIES ARE REQUIRED. Performed at [facility], [address] [place], [place] [address]    Report Status 10/25/2023 FINAL  Final  Blood Culture ID Panel (Reflexed)     Status: Abnormal   Collection Time: 10/23/23 11:37 PM  Result Value Ref Range Status   Enterococcus faecalis NOT DETECTED NOT DETECTED Final   Enterococcus Faecium NOT DETECTED NOT DETECTED Final   Listeria monocytogenes NOT DETECTED NOT DETECTED Final   Staphylococcus species DETECTED (A) NOT DETECTED Final    Comment: CRITICAL RESULT CALLED TO, READ BACK BY AND VERIFIED WITH: PHARMD E JACKSON 10/24/2023 @ 2345 BY AB    Staphylococcus aureus (BCID) NOT DETECTED NOT DETECTED Final   Staphylococcus epidermidis DETECTED (A) NOT DETECTED Final    Comment: Methicillin (oxacillin) resistant coagulase negative staphylococcus. Possible blood culture contaminant (unless isolated from more than one blood culture draw or clinical case suggests  pathogenicity). No antibiotic treatment is indicated for blood  culture contaminants. CRITICAL RESULT CALLED TO, READ BACK BY AND VERIFIED WITH: PHARMD E JACKSON 10/24/2023 @ 2345 BY AB    Staphylococcus lugdunensis NOT DETECTED NOT DETECTED Final   Streptococcus species NOT DETECTED NOT DETECTED Final   Streptococcus agalactiae NOT DETECTED NOT DETECTED Final   Streptococcus pneumoniae NOT DETECTED NOT DETECTED Final   Streptococcus pyogenes NOT DETECTED NOT DETECTED Final   A.calcoaceticus-baumannii NOT DETECTED NOT DETECTED Final   Bacteroides fragilis NOT DETECTED NOT DETECTED Final   Enterobacterales NOT DETECTED NOT DETECTED Final   Enterobacter cloacae complex NOT DETECTED NOT DETECTED Final   Escherichia coli NOT DETECTED NOT DETECTED Final   Klebsiella aerogenes NOT DETECTED NOT DETECTED Final   Klebsiella oxytoca NOT DETECTED NOT DETECTED Final   Klebsiella pneumoniae NOT DETECTED NOT DETECTED Final   Proteus species NOT DETECTED NOT DETECTED Final   Salmonella species NOT DETECTED NOT DETECTED Final   Serratia marcescens NOT DETECTED NOT DETECTED Final   Haemophilus influenzae NOT DETECTED NOT DETECTED Final   Neisseria meningitidis NOT DETECTED NOT DETECTED Final   Pseudomonas aeruginosa NOT DETECTED NOT DETECTED Final   Stenotrophomonas maltophilia NOT DETECTED NOT DETECTED Final   Candida albicans NOT DETECTED NOT DETECTED Final   Candida auris NOT DETECTED NOT DETECTED Final   Candida glabrata NOT DETECTED NOT DETECTED Final   Candida krusei NOT DETECTED NOT DETECTED Final   Candida parapsilosis NOT DETECTED NOT DETECTED Final   Candida tropicalis NOT DETECTED NOT DETECTED Final   Cryptococcus neoformans/gattii NOT DETECTED NOT DETECTED Final   Methicillin resistance mecA/C DETECTED (A) NOT DETECTED Final    Comment: CRITICAL RESULT CALLED TO, READ BACK BY AND VERIFIED WITH: PHARMD E JACKSON 10/24/2023 @ 2345 BY AB Performed at [facility], [address],  [place], [place] [address]   Blood Culture (routine x 2)     Status: None   Collection Time: 10/24/23 12:41 AM   Specimen: BLOOD  Result Value Ref Range Status   Specimen Description   Final    BLOOD BLOOD RIGHT ARM Performed at [facility], [address] [place], [place] [address]    Special Requests   Final    BOTTLES DRAWN AEROBIC AND ANAEROBIC Blood Culture results may not be optimal due to an inadequate volume of blood received in culture bottles Performed at [facility], [address], [place], [place] [address]    Culture   Final    NO GROWTH 5 DAYS Performed at [facility], [address] [place], [place] [address]    Report Status  10/29/2023 FINAL  Final  MRSA Next Gen by PCR, Nasal     Status: None   Collection Time: 10/24/23  1:02 PM   Specimen: Nasal Mucosa; Nasal Swab  Result Value Ref Range Status   MRSA by PCR Next Gen NOT DETECTED NOT DETECTED Final    Comment: (NOTE) The GeneXpert MRSA Assay (FDA approved for NASAL specimens only), is one component of a comprehensive MRSA colonization surveillance program. It is not intended to diagnose MRSA infection nor to guide or monitor treatment for MRSA infections. Test performance is not FDA approved in patients less than 79 years old. Performed at [facility], [address] [place], [place] [address]   Aerobic/Anaerobic Culture w Gram Stain (surgical/deep wound)     Status: None   Collection Time: 10/24/23  4:22 PM   Specimen: Abscess  Result Value Ref Range Status   Specimen Description   Final    ABSCESS DRAIN Performed at [facility], [address] [place], [place] [address]    Special Requests   Final    NONE Performed at [facility], [address], [place], [place] [address]    Gram Stain   Final    FEW WBC PRESENT, PREDOMINANTLY PMN ABUNDANT GRAM POSITIVE COCCI MODERATE GRAM NEGATIVE  RODS MODERATE GRAM POSITIVE RODS    Culture   Final    ABUNDANT ESCHERICHIA COLI ABUNDANT KLEBSIELLA OXYTOCA ABUNDANT ENTEROCOCCUS FAECIUM MODERATE PSEUDOMONAS AERUGINOSA SUSCEPTIBILITIES PERFORMED ON PREVIOUS CULTURE WITHIN THE LAST 5 DAYS. ABUNDANT PARABACTEROIDES DISTASONIS BETA LACTAMASE POSITIVE Performed at [facility], [address], [place], [place] [address]    Report Status 10/27/2023 FINAL  Final   Organism ID, Bacteria ENTEROCOCCUS FAECIUM  Final   Organism ID, Bacteria PSEUDOMONAS AERUGINOSA  Final      Susceptibility   Enterococcus faecium - MIC*    AMPICILLIN >=32 RESISTANT Resistant     VANCOMYCIN <=0.5 SENSITIVE Sensitive     GENTAMICIN SYNERGY SENSITIVE Sensitive     * ABUNDANT ENTEROCOCCUS FAECIUM   Pseudomonas aeruginosa - MIC*    CEFTAZIDIME <=1 SENSITIVE Sensitive     CIPROFLOXACIN <=0.25 SENSITIVE Sensitive     GENTAMICIN <=1 SENSITIVE Sensitive     IMIPENEM 0.5 SENSITIVE Sensitive     PIP/TAZO <=4 SENSITIVE Sensitive ug/mL    CEFEPIME 0.25 SENSITIVE Sensitive     * MODERATE PSEUDOMONAS AERUGINOSA  Aerobic/Anaerobic Culture w Gram Stain (surgical/deep wound)     Status: None   Collection Time: 10/24/23  9:44 PM   Specimen: Abscess; Wound  Result Value Ref Range Status   Specimen Description   Final    ABSCESS Performed at [facility], [address], [place], [place] [address]    Special Requests   Final    ABSCESS Performed at [facility], [address], [place], [place] [address]    Gram Stain   Final    FEW WBC PRESENT, PREDOMINANTLY PMN MODERATE GRAM NEGATIVE RODS MODERATE GRAM POSITIVE COCCI    Culture   Final    MODERATE KLEBSIELLA OXYTOCA FEW PSEUDOMONAS AERUGINOSA FEW ENTEROCOCCUS FAECIUM SUSCEPTIBILITIES PERFORMED ON PREVIOUS CULTURE WITHIN THE LAST 5 DAYS. MODERATE BACTEROIDES THETAIOTAOMICRON BETA LACTAMASE POSITIVE Performed at [facility], [address] [place],  [place] [address]    Report Status 10/28/2023 FINAL  Final   Organism ID, Bacteria KLEBSIELLA OXYTOCA  Final      Susceptibility   Klebsiella oxytoca - MIC*  AMPICILLIN RESISTANT Resistant     CEFEPIME <=0.12 SENSITIVE Sensitive     CEFTAZIDIME <=1 SENSITIVE Sensitive     CEFTRIAXONE <=0.25 SENSITIVE Sensitive     CIPROFLOXACIN <=0.25 SENSITIVE Sensitive     GENTAMICIN <=1 SENSITIVE Sensitive     IMIPENEM <=0.25 SENSITIVE Sensitive     TRIMETH/SULFA <=20 SENSITIVE Sensitive     AMPICILLIN/SULBACTAM <=2 SENSITIVE Sensitive     PIP/TAZO <=4 SENSITIVE Sensitive ug/mL    * MODERATE KLEBSIELLA OXYTOCA  Aerobic/Anaerobic Culture w Gram Stain (surgical/deep wound)     Status: None   Collection Time: 10/24/23  9:47 PM   Specimen: Path Tissue  Result Value Ref Range Status   Specimen Description   Final    TISSUE Performed at [facility], [address] [place], [place] [address]    Special Requests   Final    TISSUE Performed at [facility], [address] [place], [place] [address]    Gram Stain   Final    RARE WBC PRESENT, PREDOMINANTLY PMN FEW GRAM POSITIVE COCCI RARE GRAM NEGATIVE RODS    Culture   Final    MODERATE ESCHERICHIA COLI MODERATE KLEBSIELLA OXYTOCA MODERATE BACTEROIDES OVATUS BETA LACTAMASE POSITIVE Performed at [facility], [address] [place], [place] [address]    Report Status 10/29/2023 FINAL  Final   Organism ID, Bacteria ESCHERICHIA COLI  Final   Organism ID, Bacteria KLEBSIELLA OXYTOCA  Final      Susceptibility   Escherichia coli - MIC*    AMPICILLIN >=32 RESISTANT Resistant     CEFEPIME <=0.12 SENSITIVE Sensitive     CEFTAZIDIME <=1 SENSITIVE Sensitive     CEFTRIAXONE <=0.25 SENSITIVE Sensitive     CIPROFLOXACIN <=0.25 SENSITIVE Sensitive     GENTAMICIN <=1 SENSITIVE Sensitive     IMIPENEM <=0.25 SENSITIVE Sensitive     TRIMETH/SULFA <=20 SENSITIVE Sensitive     AMPICILLIN/SULBACTAM 8 SENSITIVE  Sensitive     PIP/TAZO <=4 SENSITIVE Sensitive ug/mL    * MODERATE ESCHERICHIA COLI   Klebsiella oxytoca - MIC*    AMPICILLIN RESISTANT Resistant     CEFEPIME <=0.12 SENSITIVE Sensitive     CEFTAZIDIME <=1 SENSITIVE Sensitive     CEFTRIAXONE <=0.25 SENSITIVE Sensitive     CIPROFLOXACIN <=0.25 SENSITIVE Sensitive     GENTAMICIN <=1 SENSITIVE Sensitive     IMIPENEM <=0.25 SENSITIVE Sensitive     TRIMETH/SULFA <=20 SENSITIVE Sensitive     AMPICILLIN/SULBACTAM <=2 SENSITIVE Sensitive     PIP/TAZO <=4 SENSITIVE Sensitive ug/mL    * MODERATE KLEBSIELLA OXYTOCA  Culture, blood (Routine X 2) w Reflex to ID Panel     Status: None (Preliminary result)   Collection Time: 10/30/23  9:27 AM   Specimen: BLOOD LEFT HAND  Result Value Ref Range Status   Specimen Description BLOOD LEFT HAND  Final   Special Requests   Final    BOTTLES DRAWN AEROBIC AND ANAEROBIC Blood Culture results may not be optimal due to an inadequate volume of blood received in culture bottles   Culture   Final    NO GROWTH 3 DAYS Performed at [facility], [address], [place], [place] [address]    Report Status PENDING  Incomplete  Culture, blood (Routine X 2) w Reflex to ID Panel     Status: None (Preliminary result)   Collection Time: 10/30/23  9:28 AM   Specimen: BLOOD LEFT HAND  Result Value Ref Range Status   Specimen  Description BLOOD LEFT HAND  Final   Special Requests   Final    BOTTLES DRAWN AEROBIC AND ANAEROBIC Blood Culture results may not be optimal due to an inadequate volume of blood received in culture bottles   Culture   Final    NO GROWTH 3 DAYS Performed at [facility], [address] [place], [place] [address]    Report Status PENDING  Incomplete  Aerobic/Anaerobic Culture w Gram Stain (surgical/deep wound)     Status: None (Preliminary result)   Collection Time: 10/30/23  2:05 PM   Specimen: Path Tissue  Result Value Ref Range Status   Specimen Description TISSUE  Final    Special Requests LEFT MEDIAL THIGH WOUND  Final   Gram Stain NO WBC SEEN FEW GRAM NEGATIVE RODS   Final   Culture   Final    MODERATE KLEBSIELLA OXYTOCA FEW ENTEROCOCCUS FAECALIS SUSCEPTIBILITIES TO FOLLOW Performed at [facility], [address] [place] [address], [place], [place] [address]    Report Status PENDING  Incomplete   Organism ID, Bacteria KLEBSIELLA OXYTOCA  Final      Susceptibility   Klebsiella oxytoca - MIC*    AMPICILLIN >=32 RESISTANT Resistant     CEFEPIME <=0.12 SENSITIVE Sensitive     CEFTAZIDIME <=1 SENSITIVE Sensitive     CEFTRIAXONE <=0.25 SENSITIVE Sensitive     CIPROFLOXACIN <=0.25 SENSITIVE Sensitive     GENTAMICIN <=1 SENSITIVE Sensitive     IMIPENEM <=0.25 SENSITIVE Sensitive     TRIMETH/SULFA <=20 SENSITIVE Sensitive     AMPICILLIN/SULBACTAM 8 SENSITIVE Sensitive     PIP/TAZO 16 SENSITIVE Sensitive ug/mL    * MODERATE KLEBSIELLA OXYTOCA    I have personally spent 15 minutes involved in face-to-face and non-face-to-face activities for this patient on the day of the visit. Professional time spent includes the following activities: Preparing to see the patient (review of tests), Obtaining and/or reviewing separately obtained history (admission/discharge record), Performing a medically appropriate examination and/or evaluation , Ordering medications/tests/procedures, referring and communicating with other health care professionals, Documenting clinical information in the EMR, Independently interpreting results (not separately reported), Communicating results to the patient/family/caregiver, Counseling and educating the patient/family/caregiver and Care coordination (not separately reported).    Marcos Eke, NP [facility]  11/02/2023  2:14 PM

## 2023-11-02 NOTE — Progress Notes (Signed)
Patient comfortable in bed. Completed bed change and bath on patient request (2045). No pain reported.

## 2023-11-02 NOTE — Progress Notes (Signed)
NAME:  Ashley Galloway, MRN:  [id number], DOB:  1950/10/14, LOS: 9 ADMISSION DATE:  10/23/2023, CONSULTATION DATE:  10/24/2023 REFERRING MD: Azucena Fallen, MD, CHIEF COMPLAINT: Septic shock  History of Present Illness:  A 73 y.o. female with HTN, dyslipidemia, PVD, COPD, tobacco smoking (quit few months ago), recent Streptococcus intermedius bacteremia, hepatic abscess (percutaneous drain d/c on 10/20/2022), and diverticulitis in Dec 2024 who completed antibiotics few days ago. She presented to ER 10/24/2023 in am with c/o LLQ swelling and erythema, and left thigh pain for few days. She has fever, chills, general malaise and loss of appetite, but denies chest pain, cough, SOB, wheezing, or N/V/D. She reports constipation.  New CT imaging finds new large LLQ abscess with associated diverticulitis as well as fluid/air into abductor muscle concerning for myositis.   Received 5L of LR boluses Started on IV abx: Vancomycin, Cefepime, and Flagyl--->Linezolid, Zosyn and Clindamycin. General surgery consulted and request IR, s/p perc drain to abdominal abscess. Developed Afib in ICU, new   Pertinent  Medical History  HTN, dyslipidemia, PVD, COPD, tobacco smoking (quit few months ago), recent Streptococcus intermedius bacteremia, hepatic abscess (percutaneous drain d/c on 10/20/2022), diverticulitis in Dec 2024   Significant Hospital Events: Including procedures, antibiotic start and stop dates in addition to other pertinent events   1/25 to OR for debridement of necrotizing fasciitis of LLE, IR placed LLQ drain for abscess  1/29 transferred to [facility] ICU in anticipation of repeat I/D of L leg on 1/31 1/31 LLE debridement 2/3 Pt declined further surgical intervention with plans to pursue hospice care  Interim History / Subjective:  Resting in bed this morning. Denies acute pain or other concerns including fevers, chills. There were plans for loop colostomy today but pt declined and decided  not to pursue any further surgery.  Objective   Blood pressure (!) 93/58, pulse 97, temperature (!) 100.9 F (38.3 C), resp. rate (!) 24, height 5\' 5"  (1.651 m), weight 85.8 kg, SpO2 100%.    Vent Mode: PSV;CPAP FiO2 (%):  [40 %] 40 % PEEP:  [5 cmH20] 5 cmH20 Pressure Support:  [8 cmH20] 8 cmH20   Intake/Output Summary (Last 24 hours) at 11/02/2023 0656 Last data filed at 11/02/2023 0644 Gross per 24 hour  Intake 3140.48 ml  Output 1245 ml  Net 1895.48 ml   Filed Weights   10/29/23 0358 10/31/23 0500 11/01/23 0500  Weight: 85 kg 84.4 kg 85.8 kg    Examination: General: Critically ill-appearing, drowsy, opens eyes HENT: [place], AT Eyes: EOMI, no scleral icterus Respiratory: CTABL Cardiovascular: RRR no MRG, no JVD GI: BS+, soft, nontender. Drain in place with feculent output. Extremities: LLE wrapped, non-pitting edema in feet and hands without tenderness Neuro: Drowsy, opens eyes, follows commands GU: Foley in place  Resolved Hospital Problem list   Acute Hypoxic Respiratory Failure after surgery Off vent 2/2. Hx COPD. 3L [place]. Duonebs prn.  Septic shock due to abscess  Assessment & Plan:   L thigh necrotizing fasciitis and intra abdominal abscess/diverticulitis S/p CT guided drain placement 1/25 for abdominal abscess - multiorganism S/p excision and debridement of LLE with wound vac placement 1/25, debridement 1/31 Staph/strep Bacteremia Unfortunately this is progressing. CT yesterday with extension of fecal matter and air across midline. Pt opting to eschew surgery in favor of hospice care. - Continue linezolid and Zosyn for necrotizing fasciitis. OR cultures with E. Coli and Klebsiella oxytoca  - negative repeat blood cultures x 3 days, resolved staph/strep bacteremia - BP  support midodrine 5 TID  Anemia Normocytic, progressive over 2 months, iron studies favor chronic disease and inflammation.  - Trend - Transfuse for goal >7  Volume overload Some anasarca on exam.  Resume home lasix 20  New Afib due to sepsis 10/05/2023: Echo EF 60%, G1DD Optimize electroltes K>4, Mg>2 - back in NSR  Anxiety  Well-controlled   Acute on Chronic pain Lumbar stenosis No current complaints. Continue dilaudid.  Nutrition. Protein calorie malnutrition. Tube feeds per cortrak. Protein boost supplement. Dietitian consult.   Best Practice (right click and "Reselect all SmartList Selections" daily)   Diet/type: Tube feeding DVT prophylaxis prophylactic heparin  Pressure ulcer(s): N/A GI prophylaxis: H2B Lines: Central line Foley:  Yes, and it is still needed Code Status:  full code Last date of multidisciplinary goals of care discussion [husband updated at bedside 11/01/23] Full code. Wishes to continue conversations regarding options for surgery.   Labs   CBC: Recent Labs  Lab 10/27/23 0559 10/28/23 0517 10/29/23 0346 10/30/23 0926 10/30/23 1429 10/30/23 1817 11/01/23 0258  WBC 12.3* 9.6 9.8 6.7  --   --  7.0  HGB 7.8* 7.3* 7.0* 8.9* 11.6* 9.9* 8.5*  HCT 23.1* 22.8* 21.2* 26.3* 34.0* 29.0* 25.2*  MCV 85.2 87.0 85.8 84.3  --   --  85.7  PLT 221 193 200 200  --   --  126*    Basic Metabolic Panel: Recent Labs  Lab 10/28/23 0517 10/28/23 1707 10/29/23 0346 10/29/23 2259 10/30/23 0426 10/30/23 0926 10/30/23 1429 10/30/23 1817 11/01/23 0258 11/01/23 1426 11/02/23 0429  NA 135  --  132*  --   --  135 135 135 132*  --  131*  K 3.8  --  3.8  --   --  3.6 3.9 3.5 3.5  --  4.5  CL 100  --  95*  --   --  100  --   --  101  --  97*  CO2 25  --  26  --   --  21*  --   --  21*  --  21*  GLUCOSE 104*  --  137*  --   --  100*  --   --  260*  --  141*  BUN 11  --  5*  --   --  12  --   --  16  --  12  CREATININE 0.31*  --  0.51  --   --  0.57  --   --  0.63  --  0.47  CALCIUM 8.3*  --  8.1*  --   --  8.1*  --   --  7.4*  --  7.5*  MG  --    < > 2.2 2.0 2.0  --   --   --  1.9 2.0 2.0  PHOS  --    < > 3.0 1.9* 1.8*  --   --   --   --  <1.0* 3.6   < > =  values in this interval not displayed.   GFR: Estimated Creatinine Clearance: 68.7 mL/min (by C-G formula based on SCr of 0.47 mg/dL). Recent Labs  Lab 10/28/23 0517 10/29/23 0346 10/30/23 0926 11/01/23 0258  WBC 9.6 9.8 6.7 7.0    Liver Function Tests: Recent Labs  Lab 11/01/23 0258 11/02/23 0429  AST 28 31  ALT 18 20  ALKPHOS 105 154*  BILITOT 0.6 0.3  PROT 4.5* 4.9*  ALBUMIN <1.5* <1.5*   No results for  input(s): "LIPASE", "AMYLASE" in the last 168 hours. No results for input(s): "AMMONIA" in the last 168 hours.  ABG    Component Value Date/Time   PHART 7.349 (L) 10/30/2023 1817   PCO2ART 39.0 10/30/2023 1817   PO2ART 197 (H) 10/30/2023 1817   HCO3 21.2 10/30/2023 1817   TCO2 22 10/30/2023 1817   ACIDBASEDEF 4.0 (H) 10/30/2023 1817   O2SAT 100 10/30/2023 1817     Coagulation Profile: Recent Labs  Lab 11/02/23 0429  INR 1.2    Cardiac Enzymes: No results for input(s): "CKTOTAL", "CKMB", "CKMBINDEX", "TROPONINI" in the last 168 hours.  HbA1C: Hgb A1c MFr Bld  Date/Time Value Ref Range Status  10/24/2023 04:10 AM 6.3 (H) 4.8 - 5.6 % Final    Comment:    (NOTE) Pre diabetes:          5.7%-6.4%  Diabetes:              >6.4%  Glycemic control for   <7.0% adults with diabetes     CBG: Recent Labs  Lab 11/01/23 1128 11/01/23 1510 11/01/23 1925 11/01/23 2314 11/02/23 0322  GLUCAP 211* 119* 93 134* 94    Review of Systems:   Negative per above  Past Medical History:  She,  has a past medical history of Centrilobular emphysema (HCC), Colon polyps, Diverticulitis, HLD (hyperlipidemia), TIA (transient ischemic attack), and Vesicointestinal fistula.   Surgical History:   Past Surgical History:  Procedure Laterality Date   CHOLECYSTECTOMY     I & D EXTREMITY Left 10/24/2023   Procedure: IRRIGATION AND DEBRIDEMENT EXTREMITY Left lower extremity;  Surgeon: Samson Frederic, MD;  Location: [facility] ORS;  Service: Orthopedics;  Laterality: Left;  1 and  1/2 large wound sponge as well as a smaller wound sponge placed in upper thigh incision, 1 1/2 large wound sponge placed in lower left leg incision. Staples placed by surgeon in intermittent places to keep sponges inside and wound v   IR GUIDED DRAIN W CATHETER PLACEMENT  09/16/2023   IR RADIOLOGIST EVAL & MGMT  10/21/2023   PARTIAL COLECTOMY     Tubulovillous adenoma   SPINE SURGERY       Social History:   reports that she has been smoking cigarettes. She has been exposed to tobacco smoke. She has never used smokeless tobacco.   Family History:  Her family history is negative for Breast cancer.   Allergies Allergies  Allergen Reactions   Tape Other (See Comments)    Tape from hormone patch.     Home Medications  Prior to Admission medications   Medication Sig Start Date End Date Taking? Authorizing Provider  aspirin EC 81 MG tablet Take 81 mg by mouth daily. Swallow whole.   Yes [provider]  cefadroxil (DURICEF) 500 MG capsule Take 1,000 mg by mouth 2 (two) times daily.   Yes [provider]  KLOR-CON M20 20 MEQ tablet Take 20 mEq by mouth daily.   Yes [provider]  latanoprost (XALATAN) 0.005 % ophthalmic solution Place 1 drop into both eyes at bedtime.   Yes [provider]  lovastatin (MEVACOR) 20 MG tablet Take 20 mg by mouth daily.   Yes [provider]  methocarbamol (ROBAXIN) 500 MG tablet Take 500 mg by mouth daily as needed for muscle spasms. 10/18/23  Yes [provider]  Multiple Vitamins-Minerals (CENTRUM SILVER WOMEN 50+) TABS Take 1 tablet by mouth daily.   Yes [provider]  ondansetron (ZOFRAN-ODT) 4 MG disintegrating tablet  Take 1 tablet (4 mg total) by mouth every 8 (eight) hours as needed for nausea or vomiting. 10/19/23  Yes Kuppelweiser, Cassie L, RPH-CPP  Probiotic Product (PROBIOTIC PO) Take 1 capsule by mouth daily.   Yes [provider]  traMADol (ULTRAM) 50 MG tablet Take 50 mg by  mouth 2 (two) times daily as needed for moderate pain (pain score 4-6) or severe pain (pain score 7-10). 10/22/23  Yes [provider]  triamterene-hydrochlorothiazide (DYAZIDE) 37.5-25 MG capsule Take 1 capsule by mouth every morning.   Yes [provider]  ALPRAZolam (XANAX) 0.25 MG tablet Take 0.25 mg by mouth daily. Patient not taking: Reported on 10/24/2023 10/22/23   [provider]  cefTRIAXone (ROCEPHIN) 2 g injection Inject 2 g into the muscle daily. For 5 days starting 10/05/23 Patient not taking: Reported on 10/24/2023    [provider]  HYDROcodone-acetaminophen (NORCO/VICODIN) 5-325 MG tablet Take 1 tablet by mouth every 12 (twelve) hours. Patient not taking: Reported on 10/24/2023 10/23/23   [provider]  predniSONE (STERAPRED UNI-PAK 21 TAB) 10 MG (21) TBPK tablet Take 10 mg by mouth as directed. Patient not taking: Reported on 10/24/2023 10/22/23   [provider]  timolol (TIMOPTIC) 0.5 % ophthalmic solution Place 1 drop into both eyes 2 (two) times daily. Patient not taking: Reported on 10/24/2023 07/08/23   [provider]  traZODone (DESYREL) 50 MG tablet Take 1-2 tablets by mouth at bedtime. Patient not taking: Reported on 10/24/2023 04/23/23   [provider]     Critical care time: 35 mins

## 2023-11-02 NOTE — Progress Notes (Signed)
3 Days Post-Op   Subjective/Chief Complaint: Extubated, able to have conversation now and is appropriate   Objective: Vital signs in last 24 hours: Temp:  [99.5 F (37.5 C)-101.5 F (38.6 C)] 100.6 F (38.1 C) (02/03 0700) Pulse Rate:  [70-98] 97 (02/03 0215) Resp:  [12-32] 23 (02/03 0700) SpO2:  [100 %] 100 % (02/03 0215) Arterial Line BP: (135-177)/(36-48) 177/47 (02/03 0700) FiO2 (%):  [40 %] 40 % (02/02 0802) Last BM Date : 10/31/23  Intake/Output from previous day: 02/02 0701 - 02/03 0700 In: 3035.5 [I.V.:425.1; NG/GT:1305.3; IV Piggyback:1265.1] Out: 1245 [Urine:1065; Emesis/NG output:180] Intake/Output this shift: No intake/output data recorded.  Ab soft nondistended drain with stool present  Lab Results:  Recent Labs    10/30/23 0926 10/30/23 1429 10/30/23 1817 11/01/23 0258  WBC 6.7  --   --  7.0  HGB 8.9*   < > 9.9* 8.5*  HCT 26.3*   < > 29.0* 25.2*  PLT 200  --   --  126*   < > = values in this interval not displayed.   BMET Recent Labs    11/01/23 0258 11/02/23 0429  NA 132* 131*  K 3.5 4.5  CL 101 97*  CO2 21* 21*  GLUCOSE 260* 141*  BUN 16 12  CREATININE 0.63 0.47  CALCIUM 7.4* 7.5*   PT/INR Recent Labs    11/02/23 0429  LABPROT 15.0  INR 1.2   ABG Recent Labs    10/30/23 1429 10/30/23 1817  PHART 7.327* 7.349*  HCO3 20.5 21.2    Studies/Results: CT ABDOMEN PELVIS W CONTRAST Result Date: 11/01/2023 CLINICAL DATA:  Preop, hepatic abscess, diverticulitis. EXAM: CT ABDOMEN AND PELVIS WITH CONTRAST TECHNIQUE: Multidetector CT imaging of the abdomen and pelvis was performed using the standard protocol following bolus administration of intravenous contrast. RADIATION DOSE REDUCTION: This exam was performed according to the departmental dose-optimization program which includes automated exposure control, adjustment of the mA and/or kV according to patient size and/or use of iterative reconstruction technique. CONTRAST:  75mL OMNIPAQUE  IOHEXOL 350 MG/ML SOLN COMPARISON:  CT pelvis 10/24/2023 and CT abdomen pelvis 10/24/2023. FINDINGS: Lower chest: Small bilateral pleural effusions, left greater than right. Mild collapse/consolidation in the left lower lobe. Central line terminates in the right atrium. Atherosclerotic calcification of the aorta and coronary arteries. Heart is enlarged. No pericardial effusion. Feeding tube in the distal esophagus. Hepatobiliary: Similar biliary ductal dilatation after cholecystectomy. Liver is otherwise unremarkable. Pancreas: Negative. Spleen: Negative. Adrenals/Urinary Tract: Adrenal glands are unremarkable. Small low-attenuation lesion in the right kidney. No specific follow-up necessary. Kidneys are otherwise unremarkable. Ureters are decompressed. Bladder is low in volume with a Foley catheter in place. Stomach/Bowel: Feeding tube terminates in the stomach. Stomach and small bowel are otherwise unremarkable. Right hemicolectomy. Majority of the colon is otherwise unremarkable. There is extensive thickening of the sigmoid colon with an exophytic stool containing collection arising from the posterior wall of the sigmoid, measuring 7.1 x 8.8 cm (3/85). It contains a percutaneous drain and has enlarged from 6.4 x 7.5 cm on 10/24/2023. Unfortunately, there is stool and air extending from the left lateral aspect of this collection (3/84) with extension superiorly along the left iliac fossa and anterior to the left psoas muscle, roughly measuring 6.8 x 13.1 cm (3/59). Extensive surrounding air in the left retroperitoneum, small bowel mesentery and minimally within the right retroperitoneum. Vascular/Lymphatic: Atherosclerotic calcification of the aorta. No pathologically enlarged lymph nodes. Reproductive: Uterus is visualized.  No adnexal mass. Other: Small  loculated left adnexal fluid. Musculoskeletal: Extensive muscular edema and subcutaneous air in the upper left thigh, incompletely visualized. Open wound along  the posterolateral left thigh (3/117). Degenerative changes in the spine. IMPRESSION: 1. Enlarging collection of fecal material along the posterior margin of the sigmoid, with drain in place. New extension of fecal matter and air superiorly, along the left iliac fossa and left psoas muscle, with air dissecting across the midline into the right retroperitoneum. 2. Partially imaged necrotizing fasciitis in the upper left thigh with an open wound along the left posterolateral thigh. 3. Small bilateral pleural effusions. 4. Aortic atherosclerosis (ICD10-I70.0). Coronary artery calcification. Electronically Signed   By: Leanna Battles M.D.   On: 11/01/2023 18:14    Anti-infectives: Anti-infectives (From admission, onward)    Start     Dose/Rate Route Frequency Ordered Stop   10/26/23 0400  vancomycin (VANCOREADY) IVPB 1500 mg/300 mL  Status:  Discontinued        1,500 mg 150 mL/hr over 120 Minutes Intravenous Every 24 hours 10/25/23 0725 10/26/23 0754   10/26/23 0002  ceFAZolin (ANCEF) IVPB 2g/100 mL premix        2 g 200 mL/hr over 30 Minutes Intravenous Every 6 hours 10/26/23 0002 10/26/23 0650   10/25/23 2200  linezolid (ZYVOX) IVPB 600 mg        600 mg 300 mL/hr over 60 Minutes Intravenous Every 12 hours 10/25/23 1634     10/25/23 1800  micafungin (MYCAMINE) 100 mg in sodium chloride 0.9 % 100 mL IVPB  Status:  Discontinued        100 mg 105 mL/hr over 1 Hours Intravenous Every 24 hours 10/25/23 1645 10/27/23 1332   10/25/23 0215  vancomycin (VANCOREADY) IVPB 1500 mg/300 mL        1,500 mg 150 mL/hr over 120 Minutes Intravenous  Once 10/25/23 0121 10/25/23 0358   10/24/23 1000  linezolid (ZYVOX) IVPB 600 mg  Status:  Discontinued        600 mg 300 mL/hr over 60 Minutes Intravenous Every 12 hours 10/24/23 0224 10/24/23 2026   10/24/23 0800  piperacillin-tazobactam (ZOSYN) IVPB 3.375 g        3.375 g 12.5 mL/hr over 240 Minutes Intravenous Every 8 hours 10/24/23 0231     10/24/23 0600   clindamycin (CLEOCIN) IVPB 600 mg  Status:  Discontinued        600 mg 100 mL/hr over 30 Minutes Intravenous Every 8 hours 10/24/23 0224 10/25/23 1634   10/24/23 0230  piperacillin-tazobactam (ZOSYN) IVPB 3.375 g  Status:  Discontinued       Placed in "And" Linked Group   3.375 g 100 mL/hr over 30 Minutes Intravenous  Once 10/24/23 0224 10/24/23 0229   10/23/23 2345  ceFEPIme (MAXIPIME) 2 g in sodium chloride 0.9 % 100 mL IVPB        2 g 200 mL/hr over 30 Minutes Intravenous  Once 10/23/23 2337 10/24/23 0035   10/23/23 2345  metroNIDAZOLE (FLAGYL) IVPB 500 mg        500 mg 100 mL/hr over 60 Minutes Intravenous  Once 10/23/23 2337 10/24/23 0127   10/23/23 2345  vancomycin (VANCOCIN) IVPB 1000 mg/200 mL premix        1,000 mg 200 mL/hr over 60 Minutes Intravenous  Once 10/23/23 2337 10/24/23 0313       Assessment/Plan: 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 Linna Caprice 1/25  S/p IR drain of LLQ abd  abscess 1/25  - CBC pending this AM - Continue LLQ drain - cultures from debridement with E. Coli, Klebsiella oxytoca - IR drain cx with Klebsiella oxytoca, resistance to amp, otherwise sensitive. - Monitor drain and continue IV abx per ID  - tube feeds going with ongoing leakage from colon- don't know this is best idea - Ortho to take to OR later this week -CT yesterday shows enlarging collection around sigmoid with a drain and some new    FEN: TFs VTE: hep subcutaneous  I had a  talk with patients family and her today.  I think if we are going to proceed with new CT and amount of contamination this would require open hartmanns resection.  We discussed there would be almost certainly a permanent colostomy. We also discussed the risks of surgery which include but not limited to poor healing, reoperation, cardiac and pulmonary risks as well as over  a 50% chance of mortality accordint to at least one of the EGS  mortality calculators.  She would then require hip disarticulation per orthopedics.  This is also quite a morbid procedure. We discussed how this would then be long term. I think the best case scenario would be a long term snf in a wheelchair for extended period of time and possibly not going home. I don't think her quality of life would ever approach what is was before this.  A prosthesis for hip disarticulation is available but at 72 with abd surgery I dont really think she is going to be able to function anywhere close to her premorbid state.  She listened to all of this and as she has already told her family she told me that she does not want to proceed with all this and would rather pass. I have answered questions from her husband and rest of family present and I have agreed to this plan as have they.  I have relayed this message to CCM as well. We will be available as needed  Ashley Galloway 11/02/2023

## 2023-11-02 NOTE — Plan of Care (Signed)

## 2023-11-02 NOTE — Progress Notes (Signed)
 Nutrition Brief Note  Chart reviewed. Pt now transitioning to comfort care.  No further nutrition interventions planned at this time.  Please re-consult as needed.   Shelle Iron RD, LDN Contact via [employer].

## 2023-11-02 NOTE — Progress Notes (Signed)
Let Dr. Dwain Sarna know that tube feeds were not held overnight and that I stopped them this AM. He is aware. No other orders received.

## 2023-11-02 NOTE — TOC Initial Note (Addendum)
Transition of Care [facility]) - Initial/Assessment Note    Patient Details  Name: Ashley Galloway MRN: [id number] Date of Birth: [birth year]  Transition of Care [facility]) CM/SW Contact:    Marliss Coots, LCSW Phone Number: 11/02/2023, 11:32 AM  Clinical Narrative:                  11:32 AM Per charge RN, patient and family expressed interest in discharging to residential hospice. CSW introduced herself and role to patient at bedside. Patient confirmed interest in residential hospice. CSW informed patient of local residential facilities ([facility] [address], [facility]) and provided patient with [facility] Health IP Hospice List and Residential Hospice Medicare ratings ([facility], [facility]). CSW offered to call family to relay options. Patient accepted offer and consented CSW to speak with family.  12:39 PM CSW called patient's husband, Ashley Galloway, regarding residential hospice. Ashley Galloway expressed interest in placement at [facility].  3:39 PM CSW returned to patient's bedside where Ashley Galloway informed CSW of patient agreeance of discharge to [facility]. Ashley Galloway expressed preference of [facility] and interest in [facility] and [address] CSW relayed expected discharge plan to medical team and referred patient to [facility], who stated that they do not have bed availability today but would evaluate tomorrow for admission.  Expected Discharge Plan: Hospice Medical Facility Barriers to Discharge: Continued Medical Work up   Patient Goals and CMS Choice Patient states their goals for this hospitalization and ongoing recovery are:: hospice CMS Medicare.gov Compare Post Acute Care list provided to:: Patient Choice offered to / list presented to : Patient      Expected Discharge Plan and Services In-house Referral: Clinical Social Work   Post Acute Care Choice: Hospice Living arrangements for the past 2 months: Single  Family Home                                      Prior Living Arrangements/Services Living arrangements for the past 2 months: Single Family Home Lives with:: Spouse, Relatives Patient language and need for interpreter reviewed:: Yes        Need for Family Participation in Patient Care: Yes (Comment) Care giver support system in place?: Yes (comment)   Criminal Activity/Legal Involvement Pertinent to Current Situation/Hospitalization: No - Comment as needed  Activities of Daily Living   ADL Screening (condition at time of admission) Independently performs ADLs?: Yes (appropriate for developmental age) Is the patient deaf or have difficulty hearing?: No Does the patient have difficulty seeing, even when wearing glasses/contacts?: No Does the patient have difficulty concentrating, remembering, or making decisions?: No  Permission Sought/Granted Permission sought to share information with : Family Supports, Oceanographer granted to share information with : Yes, Verbal Permission Granted  Share Information with NAME: Ashley Galloway  Permission granted to share info w AGENCY: Residential Hospice  Permission granted to share info w Relationship: Spouse  Permission granted to share info w Contact Information: [phone]  Emotional Assessment Appearance:: Appears stated age Attitude/Demeanor/Rapport: Engaged Affect (typically observed): Accepting, Adaptable, Pleasant, Appropriate, Stable, Calm Orientation: : Oriented to Self, Oriented to Place, Oriented to Situation Alcohol / Substance Use: Not Applicable Psych Involvement: No (comment)  Admission diagnosis:  Infective myositis of left thigh [M60.052] Elevated troponin I level [R79.89] Colonic diverticular abscess [K57.20] Necrotizing soft tissue infection [M79.89] Sepsis, due to unspecified organism, unspecified  whether acute organ dysfunction present [facility]) [A41.9] Patient Active Problem  List   Diagnosis Date Noted   Gas gangrene of thigh (HCC) 10/30/2023   Necrotizing fasciitis of lower leg (HCC) 10/30/2023   Necrotizing fasciitis (HCC) 10/27/2023   Polymicrobial bacterial infection 10/27/2023   Necrotizing soft tissue infection 10/26/2023   Protein-calorie malnutrition, severe 10/25/2023   Infective myositis of left thigh 10/24/2023   AKI (acute kidney injury) (HCC) 10/24/2023   Diverticulitis of large intestine with abscess 10/24/2023   PAD (peripheral artery disease) (HCC) 10/24/2023   Hyperglycemia 10/24/2023   Streptococcal bacteremia 09/16/2023   Diverticulitis 09/15/2023   Lumbar stenosis 09/15/2023   Hepatic abscess 09/14/2023   Abnormal involuntary movement 08/14/2021   Anxiety 08/14/2021   Centrilobular emphysema (HCC) 08/14/2021   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 [place] [address] Phone: [phone] Fax: [phone]     Social Drivers of Health (SDOH) Social History: SDOH Screenings   Food Insecurity: Patient Unable To Answer (10/26/2023)  Housing: Patient Unable To Answer (10/26/2023)  Transportation Needs: Patient Unable To Answer (10/26/2023)  Utilities: Patient Unable To Answer (10/26/2023)  Depression (PHQ2-9): Low Risk  (10/08/2023)  Social Connections: Patient Unable To Answer (10/26/2023)  Tobacco Use: High Risk (10/30/2023)   SDOH Interventions:     Readmission Risk Interventions    10/26/2023    2:07 PM  Readmission Risk Prevention Plan  Transportation Screening Complete  PCP or Specialist Appt within 3-5 Days Complete  HRI or Home Care Consult  Complete  Social Work Consult for Recovery Care Planning/Counseling Complete  Palliative Care Screening Not Applicable  Medication Review Oceanographer) Complete

## 2023-11-02 NOTE — IPAL (Signed)
  Interdisciplinary Goals of Care Family Meeting   Date carried out: 11/02/2023  Location of the meeting: Bedside  Member's involved: Physician and Family Member or next of kin  Durable Power of Attorney or acting medical decision maker: Patient, Josephyne Tarter and spouse, Mellody Dance    Discussion: We discussed goals of care for [employer] . Surgery discussed surgical options including open hartmann's resection and permanent colostomy in addition to needing hip disarticulation with Ortho at a later point. After discussion of her quality of life post-op patient has clearly stated that she does not want to proceed with surgery and would rather pass. CCM re-confirmed GOC and patient refusing medications and requesting removal to NGT and lines. I discussed options including hospice at home and inpatient hospice. Family requesting inpatient hospice services if eligible.  Code status:   Code Status: Do not attempt resuscitation (DNR) - Comfort care   Disposition: In-patient comfort care  Time spent for the meeting: 15 min    Oluwaseyi Tull Mechele Collin, MD  11/02/2023, 10:19 AM

## 2023-11-02 NOTE — Discharge Summary (Incomplete)
Resident Discharge Summary  Patient ID: Ashley Galloway MRN: [id number] DOB/AGE: Nov 08, 1950 73 y.o.  Admit date: 10/23/2023 Discharge date: 11/02/2023  Problem List Principal Problem:   Diverticulitis of large intestine with abscess Active Problems:   Infective myositis of left thigh   AKI (acute kidney injury) (HCC)   PAD (peripheral artery disease) (HCC)   Hyperglycemia   Protein-calorie malnutrition, severe   Necrotizing soft tissue infection   Necrotizing fasciitis (HCC)   Polymicrobial bacterial infection   Gas gangrene of thigh (HCC)   Necrotizing fasciitis of lower leg (HCC)  HPI:  Pt is a 73 y.o. female with history HTN, dyslipidemia, PVD, COPD, tobacco smoking (quit few months ago), recent Streptococcus intermedius bacteremia and hepatic abscess (percutaneous drain d/c on 10/20/2022), and diverticulitis in Dec 2024 who completed antibiotics 09/2023. She presented to ER 10/24/2023 in am with c/o LLQ swelling and erythema, and left thigh pain for few days. She had fever, chills, general malaise, loss of appetite, and constipation. She denied chest pain, cough, SOB, wheezing, or N/V/D.   New CT imaging identified new large LLQ abscess with associated diverticulitis as well as fluid/air into abductor muscle concerning for myositis.  She was first admitted to [facility] for treatment of LLQ diverticular abscess and LLE soft-tissue infection that was revealed to be necrotizing fasciitis.   Hospital Course:  Presented on [facility] ED on 1/25.   Upon arrival to the ED, patient was afebrile and saturating well on room air with mild tachypnea, tachycardia, and stable blood pressure.  Labs were most notable for lactic acid 6.0, WBC 17,700, platelets 606,000, creatinine 1.34, and albumin 2.2.  CT was concerning for soft tissue gas and edema within the left thigh musculature, 6.9 cm left lower quadrant collection abutting the sigmoid colon, and severe focal stenosis and  possible thrombus in proximal right femoral profunda artery.   Blood cultures were collected, surgery was consulted by the ED physician, and the patient was treated with vancomycin, cefepime, and Flagyl. She was admitted to the hospitalist service. Antibiotics were changed to linezolid, Zosin, and clindamycin. A drain was placed by IR for the LLQ abscess and feculent material was removed.   Upon further imaging it became apparent that the patient was suffering from a necrotizing infection given circumferential and extensive air tracking along fascial planes and into the L hip, thigh, groin. Orthopedics became involved due to leg involvement. The patient also developed progressive hypotension requiring vasopressors and encephalopathy. She underwent successful I&D on 1/25 with plans for further debridement.  Blood cultures resulted with MRSE and intraoperative cultures with GPCs and GNRs. ID became involved on 1/26 and antibiotics were changed to piptazo, linezolid, and micafungin. The patient remained febrile and hypotensive.  On 1/28 the patient was transferred to [facility] hospital for further surgery. By this point her vasopressor requirements had diminished and mental status improved. On 1/31 the patient underwent a second debridement of the LLE per her orthopedic surgeons. Operative findings revealed extensive infiltration of purulent stool from the abdominal cavity into the L leg. Dissection and debridement were necessary for the bulk of the L upper leg musculature as well as portions of the gastrocnemius and soleus muscles in the distal extremity.  The patient was returned to the ICU on ventilator and extubation was achieved on 2/2. Thereafter the need for further surgical intervention was discussed with the patient alongside her ongoing antibiotic treatment, which would likely include L hip disarticulation and Hartmann's resection and permanent colostomy. Upon these  discussions, the patient was fully alert  and aware of her condition and opted to cease aggressive treatment in favor of a hospice approach.   On 11/02/23 her strategy of care was officially changed to comfort-based measures and efforts were made to find appropriate hospice placement.  Labs at discharge Lab Results  Component Value Date   CREATININE 0.47 11/02/2023   BUN 12 11/02/2023   NA 131 (L) 11/02/2023   K 4.5 11/02/2023   CL 97 (L) 11/02/2023   CO2 21 (L) 11/02/2023   Lab Results  Component Value Date   WBC 7.0 11/01/2023   HGB 8.5 (L) 11/01/2023   HCT 25.2 (L) 11/01/2023   MCV 85.7 11/01/2023   PLT 126 (L) 11/01/2023   Lab Results  Component Value Date   ALT 20 11/02/2023   AST 31 11/02/2023   ALKPHOS 154 (H) 11/02/2023   BILITOT 0.3 11/02/2023   Lab Results  Component Value Date   INR 1.2 11/02/2023   INR 1.6 (H) 10/23/2023   INR 1.3 (H) 09/16/2023    Current radiology studies CT ABDOMEN PELVIS W CONTRAST Result Date: 11/01/2023 CLINICAL DATA:  Preop, hepatic abscess, diverticulitis. EXAM: CT ABDOMEN AND PELVIS WITH CONTRAST TECHNIQUE: Multidetector CT imaging of the abdomen and pelvis was performed using the standard protocol following bolus administration of intravenous contrast. RADIATION DOSE REDUCTION: This exam was performed according to the departmental dose-optimization program which includes automated exposure control, adjustment of the mA and/or kV according to patient size and/or use of iterative reconstruction technique. CONTRAST:  75mL OMNIPAQUE IOHEXOL 350 MG/ML SOLN COMPARISON:  CT pelvis 10/24/2023 and CT abdomen pelvis 10/24/2023. FINDINGS: Lower chest: Small bilateral pleural effusions, left greater than right. Mild collapse/consolidation in the left lower lobe. Central line terminates in the right atrium. Atherosclerotic calcification of the aorta and coronary arteries. Heart is enlarged. No pericardial effusion. Feeding tube in the distal esophagus. Hepatobiliary: Similar biliary ductal  dilatation after cholecystectomy. Liver is otherwise unremarkable. Pancreas: Negative. Spleen: Negative. Adrenals/Urinary Tract: Adrenal glands are unremarkable. Small low-attenuation lesion in the right kidney. No specific follow-up necessary. Kidneys are otherwise unremarkable. Ureters are decompressed. Bladder is low in volume with a Foley catheter in place. Stomach/Bowel: Feeding tube terminates in the stomach. Stomach and small bowel are otherwise unremarkable. Right hemicolectomy. Majority of the colon is otherwise unremarkable. There is extensive thickening of the sigmoid colon with an exophytic stool containing collection arising from the posterior wall of the sigmoid, measuring 7.1 x 8.8 cm (3/85). It contains a percutaneous drain and has enlarged from 6.4 x 7.5 cm on 10/24/2023. Unfortunately, there is stool and air extending from the left lateral aspect of this collection (3/84) with extension superiorly along the left iliac fossa and anterior to the left psoas muscle, roughly measuring 6.8 x 13.1 cm (3/59). Extensive surrounding air in the left retroperitoneum, small bowel mesentery and minimally within the right retroperitoneum. Vascular/Lymphatic: Atherosclerotic calcification of the aorta. No pathologically enlarged lymph nodes. Reproductive: Uterus is visualized.  No adnexal mass. Other: Small loculated left adnexal fluid. Musculoskeletal: Extensive muscular edema and subcutaneous air in the upper left thigh, incompletely visualized. Open wound along the posterolateral left thigh (3/117). Degenerative changes in the spine. IMPRESSION: 1. Enlarging collection of fecal material along the posterior margin of the sigmoid, with drain in place. New extension of fecal matter and air superiorly, along the left iliac fossa and left psoas muscle, with air dissecting across the midline into the right retroperitoneum. 2. Partially imaged necrotizing fasciitis in  the upper left thigh with an open wound along the  left posterolateral thigh. 3. Small bilateral pleural effusions. 4. Aortic atherosclerosis (ICD10-I70.0). Coronary artery calcification. Electronically Signed   By: Leanna Battles M.D.   On: 11/01/2023 18:14    Disposition:     Allergies as of 11/02/2023       Reactions   Tape Other (See Comments)   Tape from hormone patch.     Med Rec must be completed prior to using this [facility]***       Follow-up Information     Nadara Mustard, MD Follow up.   Specialty: Orthopedic Surgery Contact information: [address] [place] [address] [phone]                  Discharged Condition: stable  Time spent on discharge greater than 40 minutes.  Vital signs at Discharge. Temp:  [99.5 F (37.5 C)-101.5 F (38.6 C)] 100.6 F (38.1 C) (02/03 1500) Pulse Rate:  [91-105] 101 (02/03 1500) Resp:  [19-35] 31 (02/03 1500) BP: (101-116)/(43-58) 101/58 (02/03 1300) SpO2:  [100 %] 100 % (02/03 1500) Arterial Line BP: (135-181)/(36-52) 166/51 (02/03 1300) Office follow up Special Information or instructions. Signed: Eliezer Bottom, MD Internal Medicine, PGY-3 11/02/2023, 4:22 PM See Amion for pager If no response to pager, please call [phone] until 1900 After 1900 please call ELINK [phone]

## 2023-11-03 DIAGNOSIS — M726 Necrotizing fasciitis: Secondary | ICD-10-CM | POA: Diagnosis not present

## 2023-11-03 DIAGNOSIS — K5720 Diverticulitis of large intestine with perforation and abscess without bleeding: Secondary | ICD-10-CM | POA: Diagnosis not present

## 2023-11-03 DIAGNOSIS — Z515 Encounter for palliative care: Secondary | ICD-10-CM

## 2023-11-03 DIAGNOSIS — R6521 Severe sepsis with septic shock: Secondary | ICD-10-CM | POA: Diagnosis not present

## 2023-11-03 DIAGNOSIS — A419 Sepsis, unspecified organism: Secondary | ICD-10-CM | POA: Diagnosis not present

## 2023-11-03 DIAGNOSIS — M7989 Other specified soft tissue disorders: Secondary | ICD-10-CM | POA: Diagnosis not present

## 2023-11-03 DIAGNOSIS — B999 Unspecified infectious disease: Secondary | ICD-10-CM | POA: Diagnosis not present

## 2023-11-03 DIAGNOSIS — K651 Peritoneal abscess: Secondary | ICD-10-CM | POA: Diagnosis not present

## 2023-11-03 MED ORDER — HYDROMORPHONE HCL 1 MG/ML IJ SOLN
0.5000 mg | INTRAMUSCULAR | Status: DC
  Administered 2023-11-03 – 2023-11-05 (×11): 0.5 mg via INTRAVENOUS
  Filled 2023-11-03 (×10): qty 0.5

## 2023-11-03 MED ORDER — HALOPERIDOL 0.5 MG PO TABS
0.5000 mg | ORAL_TABLET | ORAL | Status: DC | PRN
  Filled 2023-11-03: qty 1

## 2023-11-03 MED ORDER — LORAZEPAM 2 MG/ML IJ SOLN: 1.0000 mg | INTRAMUSCULAR | Status: DC | PRN

## 2023-11-03 MED ORDER — HALOPERIDOL LACTATE 5 MG/ML IJ SOLN: 0.5000 mg | INTRAMUSCULAR | Status: DC | PRN

## 2023-11-03 MED ORDER — HYDROMORPHONE HCL 1 MG/ML IJ SOLN
0.5000 mg | INTRAMUSCULAR | Status: DC | PRN
  Filled 2023-11-03: qty 0.5

## 2023-11-03 MED ORDER — HALOPERIDOL LACTATE 2 MG/ML PO CONC
0.5000 mg | ORAL | Status: DC | PRN
  Filled 2023-11-03: qty 5

## 2023-11-03 MED ORDER — LORAZEPAM 2 MG/ML PO CONC
1.0000 mg | ORAL | Status: DC | PRN
  Filled 2023-11-03: qty 0.5

## 2023-11-03 MED ORDER — LORAZEPAM 1 MG PO TABS
1.0000 mg | ORAL_TABLET | ORAL | Status: DC | PRN
  Administered 2023-11-04: 1 mg via ORAL
  Filled 2023-11-03: qty 1

## 2023-11-03 NOTE — Consult Note (Signed)
 Consultation Note Date: 11/03/2023   Patient Name: Ashley Galloway  DOB: 1951-06-26  MRN: [id number]  Age / Sex: 73 y.o., female  PCP: Rexanne Ingle, MD Referring Physician: Kassie Acquanetta Bradley, MD  Reason for Consultation:  Family with questions about hospice and eligibility for inpatient hospice  HPI/Patient Profile: 74 y.o. female  with past medical history of HTN, dyslipidemia, PVD, COPD, recent Strep bacteremia, hepatic abscess admitted on 10/23/2023 with necrotizing fasciitis of LLE, LLQ abdominal abscess, and bacteremia. Has had drain placed for abscess and s/p excision and multiple debridements of LLE wound. Surgery has recommended definitive treatment with hip disarticulation and Hartman's procedure with colostomy placement. After discussions with surgery patient expressed that she did not want to proceed with surgery and was transitioned to full comfort measures only.   Primary Decision Maker PATIENT  Discussion: Chart reviewed including labs, progress notes, imaging from this and previous encounters.  Met at bedside with Renea and her husband [name].  We discussed difference between Palliative and Hospice services.  Answered Analeese's questions related to dying process and prognosis.  They are in communication with [facility] re: possible placement at [facility]  Ysidra feels pain is well controlled with current interventions. I recommended scheduled pain medication and she agreed. She has ongoing abdominal pain, responds well to current dose of hydromorphone  ordered.     SUMMARY OF RECOMMENDATIONS. --Comfort measures only -Hydromorphone  0.5mg  IV q4hrs scheduled and q30 min as needed -Other comfort interventions as ordered    Code Status/Advance Care Planning: DNR   Prognosis:   < 2 weeks  Discharge Planning: To Be Determined  Primary Diagnoses: Present on Admission:  Infective  myositis of left thigh  AKI (acute kidney injury) (HCC)  Diverticulitis of large intestine with abscess  PAD (peripheral artery disease) (HCC)  Hyperglycemia   Review of Systems  Physical Exam  Vital Signs: BP (!) 101/58   Pulse (!) 101   Temp 99.7 F (37.6 C)   Resp (!) 22   Ht 5' 5 (1.651 m)   Wt 85.8 kg   SpO2 100%   BMI 31.48 kg/m  Pain Scale: 0-10   Pain Score: 0-No pain   SpO2: SpO2: 100 % O2 Device:SpO2: 100 % O2 Flow Rate: .O2 Flow Rate (L/min): 3 L/min  IO: Intake/output summary:  Intake/Output Summary (Last 24 hours) at 11/03/2023 1330 Last data filed at 11/03/2023 1153 Gross per 24 hour  Intake --  Output 1150 ml  Net -1150 ml    LBM: Last BM Date : 10/31/23 Baseline Weight: Weight: 71.3 kg Most recent weight: Weight: 85.8 kg       Thank you for this consult. Palliative medicine will continue to follow and assist as needed.  Time Total: 90 minutes Signed by: Cassondra Stain, AGNP-C Palliative Medicine  Time includes:   Preparing to see the patient (e.g., review of tests) Obtaining and/or reviewing separately obtained history Performing a medically necessary appropriate examination and/or evaluation Counseling and educating the patient/family/caregiver Ordering medications, tests, or procedures Referring and communicating  with other health care professionals (when not reported separately) Documenting clinical information in the electronic or other health record Independently interpreting results (not reported separately) and communicating results to the patient/family/caregiver Care coordination (not reported separately) Clinical documentation   Please contact Palliative Medicine Team phone at [phone] for questions and concerns.  For individual provider: See Tracey

## 2023-11-03 NOTE — TOC Progression Note (Addendum)
 Transition of Care [facility]) - Progression Note    Patient Details  Name: Brookelyn Gaynor MRN: [id number] Date of Birth: 06/20/1951  [facility] CM/SW Contact  Lauraine FORBES Saa, LCSW Phone Number: 11/03/2023, 1:51 PM  Clinical Narrative:     1:51 PM [facility] is following regarding eligibility for admission at [facility].  Expected Discharge Plan: Hospice Medical Facility Barriers to Discharge: Continued Medical Work up  Expected Discharge Plan and Services In-house Referral: Clinical Social Work   Post Acute Care Choice: Hospice Living arrangements for the past 2 months: Single Family Home                                       Social Determinants of Health (SDOH) Interventions SDOH Screenings   Food Insecurity: Patient Unable To Answer (10/26/2023)  Housing: Patient Unable To Answer (10/26/2023)  Transportation Needs: Patient Unable To Answer (10/26/2023)  Utilities: Patient Unable To Answer (10/26/2023)  Depression (PHQ2-9): Low Risk  (10/08/2023)  Social Connections: Patient Unable To Answer (10/26/2023)  Tobacco Use: High Risk (10/30/2023)    Readmission Risk Interventions    10/26/2023    2:07 PM  Readmission Risk Prevention Plan  Transportation Screening Complete  PCP or Specialist Appt within 3-5 Days Complete  HRI or Home Care Consult Complete  Social Work Consult for Recovery Care Planning/Counseling Complete  Palliative Care Screening Not Applicable  Medication Review Oceanographer) Complete

## 2023-11-03 NOTE — Progress Notes (Signed)
 Pt resting comfortably in bed. No signs of physical distress. Denies pain. Oral care provided per pt request. Lip moisturizer applied. Denies need for repositioning. Refuses O2 sat monitoring. Instructed pt to press call bell if she needs anything. Offered chaplain services.

## 2023-11-03 NOTE — Progress Notes (Signed)
Pt refuses central line dressing change at this time

## 2023-11-03 NOTE — Progress Notes (Signed)
Patient is comfortable in bed. No pain medicine required.

## 2023-11-03 NOTE — Progress Notes (Signed)
Patient ID: Ashley Galloway, female   DOB: 02-05-1951, 73 y.o.   MRN: [id number] I talked to her and her husband again today and reiterated same thing I did yesterday. They still wish to proceed with comfort care.

## 2023-11-03 NOTE — IPAL (Addendum)
 Interdisciplinary Goals of Care Family Meeting     Date carried out: 11/03/2023   Location of the meeting: Bedside   Member's involved: Physician and Family Members or next of kin   Durable Power of Attorney or acting medical decision maker: Patient, Ashley Galloway and spouse, Ashley Galloway     Discussion: Myself and attending Dr. Kassie were called to bedside and informed that the patient was reconsidering her decision of comfort/hospice care, initiated yesterday. Along with her loved ones at bedside she expressed a desire to pursue the surgeries previously discussed, including hartmann's with permanent colostomy and hip disarticulation, although at first it appears the patient was under the impression that she could pursue abdominal surgery alone. In light of the very high inherent risk and the poor prognosis of a recovery to normalcy after these procedures (reiterated today), the patient would like to re-open discussions with her surgeon Dr. Ebbie but will remain on comfort-based care at this time. We will reach out to her surgeons and await further decisions from the patient. At this time, she remains DNR comfort and curative medicines have not been restarted, but her scope of care may change shortly.  ADD 1:05 pm - After further discussion with her surgeons, the patient opted to remain comfort/hospice.   Code status:   Code Status: Do not attempt resuscitation (DNR) - Comfort care    Disposition: In-patient comfort care   Time spent for the meeting: 15 min   Ashley Africa, DO 11/03/23 10:54 am

## 2023-11-03 NOTE — Progress Notes (Signed)
Patient comfortable in bed. Denied pain. Call bell within reach.

## 2023-11-03 NOTE — Progress Notes (Signed)
 [facility] Liaison Note   Received request from [facility] for family interest in [facility]. Met with patient and husband and discussed hospice philosophy and inpatient hospice care.    At this time the patient reports that she does not want to move from the hospital for EOL care.   Inpatient care team notified and liaisons will follow.   Please do not hesitate to call with questions.   Elouise Husband, BSN, RN, [facility] Liaison [phone]

## 2023-11-03 NOTE — Progress Notes (Signed)
 NAME:  Ashley Galloway, MRN:  [id number], DOB:  [birth year], LOS: 10 ADMISSION DATE:  10/23/2023, CONSULTATION DATE:  10/24/2023 REFERRING MD: Lue Elsie BROCKS, MD, CHIEF COMPLAINT: Septic shock  History of Present Illness:  A 73 y.o. female with HTN, dyslipidemia, PVD, COPD, tobacco smoking (quit few months ago), recent Streptococcus intermedius bacteremia, hepatic abscess (percutaneous drain d/c on 10/20/2022), and diverticulitis in Dec 2024 who completed antibiotics few days ago. She presented to ER 10/24/2023 in am with c/o LLQ swelling and erythema, and left thigh pain for few days. She has fever, chills, general malaise and loss of appetite, but denies chest pain, cough, SOB, wheezing, or N/V/D. She reports constipation.  New CT imaging finds new large LLQ abscess with associated diverticulitis as well as fluid/air into abductor muscle concerning for myositis.   Received 5L of LR boluses Started on IV abx: Vancomycin , Cefepime , and Flagyl --->Linezolid , Zosyn  and Clindamycin . General surgery consulted and request IR, s/p perc drain to abdominal abscess. Developed Afib in ICU, new  Pertinent  Medical History  HTN, dyslipidemia, PVD, COPD, tobacco smoking (quit few months ago), recent Streptococcus intermedius bacteremia, hepatic abscess (percutaneous drain d/c on 10/20/2022), diverticulitis in Dec 2024   Significant Hospital Events: Including procedures, antibiotic start and stop dates in addition to other pertinent events   1/25 to OR for debridement of necrotizing fasciitis of LLE, IR placed LLQ drain for abscess  1/29 transferred to [facility] ICU in anticipation of repeat I/D of L leg on 1/31 1/31 LLE debridement 2/3 Pt declined further surgical intervention with plans to pursue hospice care  Interim History / Subjective:  Resting in bed this morning. No active complaints. States she is comfortable.  Objective   Blood pressure (!) 101/58, pulse (!) 101, temperature 99.1 F (37.3  C), resp. rate (!) 25, height 5' 5 (1.651 m), weight 85.8 kg, SpO2 100%.        Intake/Output Summary (Last 24 hours) at 11/03/2023 0703 Last data filed at 11/03/2023 0600 Gross per 24 hour  Intake 40.44 ml  Output 1255 ml  Net -1214.56 ml   Filed Weights   10/29/23 0358 10/31/23 0500 11/01/23 0500  Weight: 85 kg 84.4 kg 85.8 kg    Examination: General: Critically ill-appearing, drowsy, opens eyes Respiratory: CTABL Cardiovascular: RRR no MRG, no JVD GI: soft, nontender. Drain in place with feculent output. Extremities: LLE wrapped, non-pitting edema in feet and hands without tenderness Neuro: Drowsy, opens eyes, follows commands GU: Foley in place  Resolved Hospital Problem list   Acute Hypoxic Respiratory Failure after surgery Off vent 2/2. Hx COPD. 3L [place]. Duonebs prn.   Septic shock due to abscess  Assessment & Plan:   Ms Slevin opted for comfort-based care on 2/3. She is comfortable and at peace and refusing medicines per her goals of care. All medicines and interventions of curative intent have been withdrawn. She is awaiting placement at an inpatient hospice facility.  L thigh necrotizing fasciitis and intra abdominal abscess/diverticulitis -S/p CT guided drain placement 1/25 for abdominal abscess - multiorganism -S/p excision and debridement of LLE with wound vac placement 1/25, debridement 1/31 -Staph/strep Bacteremia   Anemia   Volume overload   New Afib due to sepsis   Anxiety     Best Practice (right click and Reselect all SmartList Selections daily)   Diet/type: Regular DVT prophylaxis : Declined Pressure ulcer(s): N/A GI prophylaxis: N/A Lines: Central line can be removed Foley:  Yes, and it is still needed Code Status:  DNR-comfort  Last date of multidisciplinary goals of care discussion [husband updated at bedside 11/02/23]   Labs   CBC: Recent Labs  Lab 10/28/23 0517 10/29/23 0346 10/30/23 0926 10/30/23 1429 10/30/23 1817  11/01/23 0258  WBC 9.6 9.8 6.7  --   --  7.0  HGB 7.3* 7.0* 8.9* 11.6* 9.9* 8.5*  HCT 22.8* 21.2* 26.3* 34.0* 29.0* 25.2*  MCV 87.0 85.8 84.3  --   --  85.7  PLT 193 200 200  --   --  126*    Basic Metabolic Panel: Recent Labs  Lab 10/28/23 0517 10/28/23 1707 10/29/23 0346 10/29/23 2259 10/30/23 0426 10/30/23 0926 10/30/23 1429 10/30/23 1817 11/01/23 0258 11/01/23 1426 11/02/23 0429  NA 135  --  132*  --   --  135 135 135 132*  --  131*  K 3.8  --  3.8  --   --  3.6 3.9 3.5 3.5  --  4.5  CL 100  --  95*  --   --  100  --   --  101  --  97*  CO2 25  --  26  --   --  21*  --   --  21*  --  21*  GLUCOSE 104*  --  137*  --   --  100*  --   --  260*  --  141*  BUN 11  --  5*  --   --  12  --   --  16  --  12  CREATININE 0.31*  --  0.51  --   --  0.57  --   --  0.63  --  0.47  CALCIUM 8.3*  --  8.1*  --   --  8.1*  --   --  7.4*  --  7.5*  MG  --    < > 2.2 2.0 2.0  --   --   --  1.9 2.0 2.0  PHOS  --    < > 3.0 1.9* 1.8*  --   --   --   --  <1.0* 3.6   < > = values in this interval not displayed.   GFR: Estimated Creatinine Clearance: 68.7 mL/min (by C-G formula based on SCr of 0.47 mg/dL). Recent Labs  Lab 10/28/23 0517 10/29/23 0346 10/30/23 0926 11/01/23 0258  WBC 9.6 9.8 6.7 7.0    Liver Function Tests: Recent Labs  Lab 11/01/23 0258 11/02/23 0429  AST 28 31  ALT 18 20  ALKPHOS 105 154*  BILITOT 0.6 0.3  PROT 4.5* 4.9*  ALBUMIN  <1.5* <1.5*   No results for input(s): LIPASE, AMYLASE in the last 168 hours. No results for input(s): AMMONIA in the last 168 hours.  ABG    Component Value Date/Time   PHART 7.349 (L) 10/30/2023 1817   PCO2ART 39.0 10/30/2023 1817   PO2ART 197 (H) 10/30/2023 1817   HCO3 21.2 10/30/2023 1817   TCO2 22 10/30/2023 1817   ACIDBASEDEF 4.0 (H) 10/30/2023 1817   O2SAT 100 10/30/2023 1817     Coagulation Profile: Recent Labs  Lab 11/02/23 0429  INR 1.2    Cardiac Enzymes: No results for input(s): CKTOTAL,  CKMB, CKMBINDEX, TROPONINI in the last 168 hours.  HbA1C: Hgb A1c MFr Bld  Date/Time Value Ref Range Status  10/24/2023 04:10 AM 6.3 (H) 4.8 - 5.6 % Final    Comment:    (NOTE) Pre diabetes:          5.7%-6.4%  Diabetes:              >6.4%  Glycemic control for   <7.0% adults with diabetes     CBG: Recent Labs  Lab 11/01/23 1510 11/01/23 1925 11/01/23 2314 11/02/23 0322 11/02/23 0724  GLUCAP 119* 93 134* 94 151*    Review of Systems:   Negative per above  Past Medical History:  She,  has a past medical history of Centrilobular emphysema (HCC), Colon polyps, Diverticulitis, HLD (hyperlipidemia), TIA (transient ischemic attack), and Vesicointestinal fistula.   Surgical History:   Past Surgical History:  Procedure Laterality Date   CHOLECYSTECTOMY     I & D EXTREMITY Left 10/24/2023   Procedure: IRRIGATION AND DEBRIDEMENT EXTREMITY Left lower extremity;  Surgeon: Fidel Rogue, MD;  Location: [facility] ORS;  Service: Orthopedics;  Laterality: Left;  1 and 1/2 large wound sponge as well as a smaller wound sponge placed in upper thigh incision, 1 1/2 large wound sponge placed in lower left leg incision. Staples placed by surgeon in intermittent places to keep sponges inside and wound v   INCISION AND DRAINAGE OF WOUND Left 10/30/2023   Procedure: IRRIGATION AND DEBRIDEMENT OF LEFT THIGHT WOUND;  Surgeon: Harden Jerona GAILS, MD;  Location: [facility] OR;  Service: Orthopedics;  Laterality: Left;   IR GUIDED DRAIN W CATHETER PLACEMENT  09/16/2023   IR RADIOLOGIST EVAL & MGMT  10/21/2023   PARTIAL COLECTOMY     Tubulovillous adenoma   SPINE SURGERY       Social History:   reports that she has been smoking cigarettes. She has been exposed to tobacco smoke. She has never used smokeless tobacco.   Family History:  Her family history is negative for Breast cancer.   Allergies Allergies  Allergen Reactions   Tape Other (See Comments)    Tape from hormone patch.     Home  Medications  Prior to Admission medications   Medication Sig Start Date End Date Taking? Authorizing Provider  aspirin EC 81 MG tablet Take 81 mg by mouth daily. Swallow whole.   Yes [provider]  cefadroxil  (DURICEF) 500 MG capsule Take 1,000 mg by mouth 2 (two) times daily.   Yes [provider]  KLOR-CON  M20 20 MEQ tablet Take 20 mEq by mouth daily.   Yes [provider]  latanoprost (XALATAN) 0.005 % ophthalmic solution Place 1 drop into both eyes at bedtime.   Yes [provider]  lovastatin (MEVACOR) 20 MG tablet Take 20 mg by mouth daily.   Yes [provider]  methocarbamol  (ROBAXIN ) 500 MG tablet Take 500 mg by mouth daily as needed for muscle spasms. 10/18/23  Yes [provider]  Multiple Vitamins-Minerals (CENTRUM SILVER WOMEN 50+) TABS Take 1 tablet by mouth daily.   Yes [provider]  ondansetron  (ZOFRAN -ODT) 4 MG disintegrating tablet Take 1 tablet (4 mg total) by mouth every 8 (eight) hours as needed for nausea or vomiting. 10/19/23  Yes Kuppelweiser, Cassie L, RPH-CPP  Probiotic Product (PROBIOTIC PO) Take 1 capsule by mouth daily.   Yes [provider]  traMADol (ULTRAM) 50 MG tablet Take 50 mg by mouth 2 (two) times daily as needed for moderate pain (pain score 4-6) or severe pain (pain score 7-10). 10/22/23  Yes [provider]  triamterene-hydrochlorothiazide (DYAZIDE) 37.5-25 MG capsule Take 1 capsule by mouth every morning.   Yes [provider]  ALPRAZolam (XANAX) 0.25 MG tablet Take 0.25 mg by mouth daily. Patient not taking: Reported on  10/24/2023 10/22/23   [provider]  cefTRIAXone  (ROCEPHIN ) 2 g injection Inject 2 g into the muscle daily. For 5 days starting 10/05/23 Patient not taking: Reported on 10/24/2023    [provider]  HYDROcodone-acetaminophen  (NORCO/VICODIN) 5-325 MG tablet Take 1 tablet by mouth every 12 (twelve) hours. Patient not taking: Reported  on 10/24/2023 10/23/23   [provider]  predniSONE (STERAPRED UNI-PAK 21 TAB) 10 MG (21) TBPK tablet Take 10 mg by mouth as directed. Patient not taking: Reported on 10/24/2023 10/22/23   [provider]  timolol (TIMOPTIC) 0.5 % ophthalmic solution Place 1 drop into both eyes 2 (two) times daily. Patient not taking: Reported on 10/24/2023 07/08/23   [provider]  traZODone (DESYREL) 50 MG tablet Take 1-2 tablets by mouth at bedtime. Patient not taking: Reported on 10/24/2023 04/23/23   [provider]     Critical care time: 20 mins

## 2023-11-04 DIAGNOSIS — K651 Peritoneal abscess: Secondary | ICD-10-CM | POA: Diagnosis not present

## 2023-11-04 DIAGNOSIS — Z515 Encounter for palliative care: Secondary | ICD-10-CM | POA: Diagnosis not present

## 2023-11-04 DIAGNOSIS — K5720 Diverticulitis of large intestine with perforation and abscess without bleeding: Secondary | ICD-10-CM | POA: Diagnosis not present

## 2023-11-04 DIAGNOSIS — M7989 Other specified soft tissue disorders: Secondary | ICD-10-CM | POA: Diagnosis not present

## 2023-11-04 LAB — CULTURE, BLOOD (ROUTINE X 2)
Culture: NO GROWTH
Culture: NO GROWTH

## 2023-11-04 NOTE — TOC Initial Note (Signed)
 Transition of Care [facility]) - Initial/Assessment Note    Patient Details  Name: Ashley Galloway MRN: [id number] Date of Birth: [birth year]  Transition [facility]) CM/SW Contact:    Stephane Powell Jansky, RN Phone Number: 11/04/2023, 2:26 PM  Clinical Narrative:                  Spoke to patient's husband Ashley Galloway at bedside.   Ashley Galloway has questions how much patient;s hospital bill will be and how much insurance will cover. NCM explained billing will file bill with insurance, usually patients/families receive bill 2 weeks after discharge. There is contact information on bill to call with questions and to arrange payment plan or if needed ask for assistance .   NCM encouraged Ashley Galloway to call billing if he had more questions.   Ashley Galloway asking how long insurance will pay for patient to stay in the hospital. NCM explained once MD determines patient is medically ready for discharge and stable for transport and a bed is available , insurance may not cover continued stay in the hospital. NCM encouraged Ashley Galloway to call number on insurance card if he has more questions.    Ashley Galloway will talk to his brother in law this afternoon around 4PM regarding [facility]  Expected Discharge Plan: Home w Hospice Care Barriers to Discharge: Continued Medical Work up   Patient Goals and CMS Choice Patient states their goals for this hospitalization and ongoing recovery are:: Patient's family discussing residential hospice CMS Medicare.gov Compare Post Acute Care list provided to:: Patient Represenative (must comment) (Husband Ashley Galloway) Choice offered to / list presented to : Patient      Expected Discharge Plan and Services In-house Referral: Clinical Social Work Discharge Planning Services: CM Consult Post Acute Care Choice: Hospice Living arrangements for the past 2 months: Single Family Home                 DME Arranged: N/A DME Agency: NA         HH Agency: NA        Prior Living  Arrangements/Services Living arrangements for the past 2 months: Single Family Home Lives with:: Spouse Patient language and need for interpreter reviewed:: Yes        Need for Family Participation in Patient Care: Yes (Comment) Care giver support system in place?: Yes (comment)   Criminal Activity/Legal Involvement Pertinent to Current Situation/Hospitalization: No - Comment as needed  Activities of Daily Living   ADL Screening (condition at time of admission) Independently performs ADLs?: Yes (appropriate for developmental age) Is the patient deaf or have difficulty hearing?: No Does the patient have difficulty seeing, even when wearing glasses/contacts?: No Does the patient have difficulty concentrating, remembering, or making decisions?: No  Permission Sought/Granted Permission sought to share information with : Family Supports, Oceanographer granted to share information with : Yes, Verbal Permission Granted  Share Information with NAME: Ashley Galloway  Permission granted to share info w AGENCY: Residential Hospice  Permission granted to share info w Relationship: Spouse  Permission granted to share info w Contact Information: [phone]  Emotional Assessment Appearance:: Appears stated age Attitude/Demeanor/Rapport: Engaged Affect (typically observed): Accepting, Adaptable, Pleasant, Appropriate, Stable, Calm Orientation: : Oriented to Self, Oriented to Place, Oriented to Situation Alcohol  / Substance Use: Not Applicable Psych Involvement: No (comment)  Admission diagnosis:  Infective myositis of left thigh [M60.052] Elevated troponin I level [R79.89] Colonic diverticular abscess [K57.20] Necrotizing soft tissue infection [M79.89] Sepsis, due to unspecified organism, unspecified  whether acute organ dysfunction present [facility]) [A41.9] Patient Active Problem List   Diagnosis Date Noted   Intra-abdominal infection 11/02/2023   Gas gangrene of  thigh (HCC) 10/30/2023   Necrotizing fasciitis of lower leg (HCC) 10/30/2023   Necrotizing fasciitis (HCC) 10/27/2023   Polymicrobial bacterial infection 10/27/2023   Necrotizing soft tissue infection 10/26/2023   Protein-calorie malnutrition, severe 10/25/2023   Infective myositis of left thigh 10/24/2023   AKI (acute kidney injury) (HCC) 10/24/2023   Diverticulitis of large intestine with abscess 10/24/2023   PAD (peripheral artery disease) (HCC) 10/24/2023   Hyperglycemia 10/24/2023   Streptococcal bacteremia 09/16/2023   Diverticulitis 09/15/2023   Lumbar stenosis 09/15/2023   Hepatic abscess 09/14/2023   Abnormal involuntary movement 08/14/2021   Anxiety 08/14/2021   Centrilobular emphysema (HCC) 08/14/2021   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 [place] [address] Phone: [phone] Fax: [phone]     Social Drivers of Health (SDOH) Social History: SDOH Screenings   Food Insecurity: Patient Unable To Answer (10/26/2023)  Housing: Patient Unable To Answer (10/26/2023)  Transportation Needs: Patient Unable To Answer (10/26/2023)  Utilities: Patient Unable To Answer (10/26/2023)  Depression (PHQ2-9): Low Risk  (10/08/2023)  Social Connections: Patient Unable To Answer (10/26/2023)  Tobacco Use: High Risk (10/30/2023)   SDOH Interventions:     Readmission Risk Interventions    10/26/2023    2:07 PM  Readmission Risk Prevention Plan  Transportation Screening Complete  PCP or Specialist Appt within 3-5 Days Complete  HRI or Home Care Consult Complete  Social Work Consult for Recovery Care  Planning/Counseling Complete  Palliative Care Screening Not Applicable  Medication Review Oceanographer) Complete

## 2023-11-04 NOTE — Plan of Care (Signed)
  Problem: Pain Managment: Goal: General experience of comfort will improve and/or be controlled Outcome: Progressing   Problem: Pain Management: Goal: Satisfaction with pain management regimen will improve Outcome: Progressing

## 2023-11-04 NOTE — Progress Notes (Signed)
 Daily Progress Note   Patient Name: Ashley Galloway       Date: 11/04/2023 DOB: September 20, 1951  Age: 73 y.o. MRN#: [id number] Attending Physician: Judeth Trenda BIRCH, MD Primary Care Physician: Rexanne Ingle, MD Admit Date: 10/23/2023  Reason for Consultation/Follow-up: Establishing goals of care  Patient Profile/HPI:   73 y.o. female  with past medical history of HTN, dyslipidemia, PVD, COPD, recent Strep bacteremia, hepatic abscess admitted on 10/23/2023 with necrotizing fasciitis of LLE, LLQ abdominal abscess, and bacteremia. Has had drain placed for abscess and s/p excision and multiple debridements of LLE wound. Surgery has recommended definitive treatment with hip disarticulation and Hartman's procedure with colostomy placement. After discussions with surgery patient expressed that she did not want to proceed with surgery and was transitioned to full comfort measures only. She was referred to [facility] for inpt evaluation. Palliative consulted for symptom management and end of life care.   Subjective: Patient sleeping. Did not arouse to my voice or touch. No signs of pain or discomfort.  No family at bedside. Chart reviewed- patient required 1mg  po lorazepam  this morning- no other prn medications needed. Symptoms appear to be well controlled.   Review of Systems  Unable to perform ROS: Other     Physical Exam Vitals and nursing note reviewed.  Constitutional:      Appearance: She is ill-appearing.     Comments: frail  Neurological:     Comments: sleeping             Vital Signs: BP (!) 107/55 (BP Location: Left Arm)   Pulse 98   Temp 98.9 F (37.2 C) (Oral)   Resp 18   Ht 5' 5 (1.651 m)   Wt 85.8 kg   SpO2 99%   BMI 31.48 kg/m  SpO2: SpO2: 99 % O2 Device: O2  Device: Room Air O2 Flow Rate: O2 Flow Rate (L/min): 3 L/min  Intake/output summary:  Intake/Output Summary (Last 24 hours) at 11/04/2023 1216 Last data filed at 11/04/2023 0400 Gross per 24 hour  Intake --  Output 550 ml  Net -550 ml   LBM: Last BM Date : 10/31/23 Baseline Weight: Weight: 71.3 kg Most recent weight: Weight: 85.8 kg       Palliative Assessment/Data: PPS: 20%      Patient Active Problem List   Diagnosis  Date Noted   Intra-abdominal infection 11/02/2023   Gas gangrene of thigh (HCC) 10/30/2023   Necrotizing fasciitis of lower leg (HCC) 10/30/2023   Necrotizing fasciitis (HCC) 10/27/2023   Polymicrobial bacterial infection 10/27/2023   Necrotizing soft tissue infection 10/26/2023   Protein-calorie malnutrition, severe 10/25/2023   Infective myositis of left thigh 10/24/2023   AKI (acute kidney injury) (HCC) 10/24/2023   Diverticulitis of large intestine with abscess 10/24/2023   PAD (peripheral artery disease) (HCC) 10/24/2023   Hyperglycemia 10/24/2023   Streptococcal bacteremia 09/16/2023   Diverticulitis 09/15/2023   Lumbar stenosis 09/15/2023   Hepatic abscess 09/14/2023   Abnormal involuntary movement 08/14/2021   Anxiety 08/14/2021   Centrilobular emphysema (HCC) 08/14/2021   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  Total time:  45 minutes Prolonged billing:  Time includes:   Preparing to see the patient (e.g., review of tests) Obtaining  and/or reviewing separately obtained history Performing a medically necessary appropriate examination and/or evaluation Counseling and educating the patient/family/caregiver Ordering medications, tests, or procedures Referring and communicating with other health care professionals (when not reported separately) Documenting clinical information in the electronic or other health record Independently interpreting results (not reported separately) and communicating results to the patient/family/caregiver Care coordination (not reported separately) Clinical documentation  Cassondra Stain, AGNP-C Palliative Medicine   Please contact Palliative Medicine Team phone at [phone] for questions and concerns.

## 2023-11-04 NOTE — Consult Note (Addendum)
 WOC Nurse Consult Note: Pt was previously followed by Dr Harden of the ortho service and went to the OR for extensive debridement on 1/31.   She has now decided on comfort care goals. Requested to provide topical treatment recommendations for impending transfer to hospice.  Pt was medicated for pain prior to the procedure but dressing change was very painful related to adhered dressings which had not been changed since surgery.  Moistened generously and friend and family member assisted with positioning and emotional support during dressing change. Pt cleaned and bed changed, since previous dressing had soaked thorugh multiple layers of linen.   Pt has full thickness post-op wounds which extend from left ankle to knee to inner thigh and inner groin and outer left thigh; areas are too extensive to measure. There are multiple areas of exposed muscles and tendons and large amt foul-smelling green drainage.  Topical treatment will not promote healing, but will be directed towards minimizing adherence and pain with dressing changes.   Dressing procedure/placement/frequency:  Bedside nurse; change dressings Q day as follows: 1. Call Pt's friend (she is a physical therapist at [facility] [employer]) to arrange for dressing change time Q day and she will come to assist if possible: NIKIA  [phone] 2. Moisten all previous dressings with NS to assist with removal and pull off slowly.  3. Apply Xeroform gauze to any exposed muscles and tendon areas in the wound bed, then apply moist fluffed kerlex to all wounds to left leg, inner left groin, and outer left thigh.  4. Cover with ABD pads 5. Cover left leg and knee with ace wrap to hold ABD pads in place.  6. Hold inner groin and left outer thigh dressings in place with mesh underwear with the crotch cut out, or tape dressings in place instead.   One and a half hours spent performing this consult.   Please re-consult if further assistance is needed.  Thank-you,  Stephane Fought MSN, RN, CWOCN, [place], CNS [phone]

## 2023-11-04 NOTE — Progress Notes (Signed)
 PROGRESS NOTE   Ashley Galloway  FMW:[id number]    DOB: [birth year]    DOA: 10/23/2023  PCP: Rexanne Ingle, MD   I have briefly reviewed patients previous medical records in [facility].  Chief Complaint  Patient presents with   Leg Pain    Brief Hospital Course:  73 y.o. female with HTN, dyslipidemia, PVD, COPD, tobacco smoking (quit few months ago), recent Streptococcus intermedius bacteremia, hepatic abscess (percutaneous drain d/c on 10/20/2022), and diverticulitis in Dec 2024 who completed antibiotics few days PTA. She presented to ER 10/24/2023 in am with c/o LLQ swelling and erythema, and left thigh pain for few days. She had fever, chills, general malaise and loss of appetite, but denies chest pain, cough, SOB, wheezing, or N/V/D. She reported constipation. New CT imaging showed new large LLQ abscess with associated diverticulitis as well as fluid/air into abductor muscle concerning for myositis. She was admitted to ICU under PCCM.  She had drain placed by IR for abdominal abscess.  Underwent excision and debridement of left lower extremity with wound VAC placement on 1/25 and debridement again on 1/31.  Surgery recommended further extensive interventions as noted below but patient opted to transition to full comfort care.  Palliative care medicine and TOC following regarding residential hospice placement.  Family yet undecided regarding discharge disposition.    Assessment & Plan:  Principal Problem:   Diverticulitis of large intestine with abscess Active Problems:   Infective myositis of left thigh   AKI (acute kidney injury) (HCC)   PAD (peripheral artery disease) (HCC)   Hyperglycemia   Protein-calorie malnutrition, severe   Necrotizing soft tissue infection   Necrotizing fasciitis (HCC)   Polymicrobial bacterial infection   Gas gangrene of thigh (HCC)   Necrotizing fasciitis of lower leg (HCC)   Intra-abdominal infection   Septic shock secondary to  intra-abdominal abscess and left thigh necrotizing fascitis - shock resolved S/p CT guided drain placement 1/25 for abdominal abscess S/p excision and debridement of LLE with wound vac placement 1/25, debridement 1/31  Staph/strep Bacteremia   As per review of prior records, on 2/4, goals of care for patient were discussed. Surgery discussed surgical options including open Hartman's resection and permanent colostomy in addition to needing hip disarticulation with Ortho at a later point.  At this point, patient opted to transition to full comfort care.  Communicated with palliative care team.  Discussed with spouse and extended family at bedside.  Spouse is yet undecided regarding discharge disposition i.e. residential hospice versus home with hospice.  He plans to discuss with other family members prior to making a decision hopefully tomorrow.  Body mass index is 31.48 kg/m.  Nutritional Status Nutrition Problem: Severe Malnutrition Etiology: acute illness (intra-abdominal abscess/diverticulitis and left thigh necrotizing fascitis) Signs/Symptoms: mild fat depletion, mild muscle depletion, energy intake < or equal to 50% for > or equal to 5 days, percent weight loss (17% in less than 1 month) Percent weight loss: 17 % (in less than 1 month) Interventions: Refer to RD note for recommendations, Ensure Enlive (each supplement provides 350kcal and 20 grams of protein), MVI, Tube feeding  Pressure Ulcer:    DVT prophylaxis:   Comfort care now   Code Status: Do not attempt resuscitation (DNR) - Comfort care:  Family Communication: Spouse and 3 other female family members at bedside Disposition:  Status is: Inpatient Remains inpatient appropriate because: Ongoing comfort care     Consultants:   General surgery Interventional radiology Orthopedics Critical care medicine  Infectious disease Palliative care medicine  Procedures:   As noted above  Antimicrobials:      Subjective:   Patient drowsy/sedated but arousable.  Oriented to person and place.  She did not have any complaints.  However spouse at bedside was somewhat upset that patient's LLE dressing had not been changed since surgery, was stuck to her wants and was painful when WOC attempted to change the dressing.  Requesting that the dressing be changed daily.  Updated RN for daily dressing changes.  Objective:   Vitals:   11/03/23 1553 11/03/23 1600 11/03/23 1753 11/03/23 2000  BP:    (!) 107/55  Pulse:    98  Resp: (!) 22 (!) 23 (!) 26 18  Temp: 100 F (37.8 C) 100 F (37.8 C) (!) 101.1 F (38.4 C) 98.9 F (37.2 C)  TempSrc:    Oral  SpO2:    99%  Weight:      Height:        General exam: Elderly female, moderately built and frail lying comfortably propped up in bed without distress. Respiratory system: Clear to auscultation. Respiratory effort normal. Cardiovascular system: S1 & S2 heard, RRR. No JVD, murmurs, rubs, gallops or clicks. No pedal edema.  Not on telemetry. Gastrointestinal system: Abdomen is nondistended, soft and nontender. No organomegaly or masses felt. Normal bowel sounds heard. Central nervous system: Mental status as noted above. No focal neurological deficits. Extremities: Left lower extremity with extensive dressing which was changed earlier today by [facility] RN but already oozing of faint bloodstained drainage on 2 dressing and sheets. Skin: No rashes, lesions or ulcers Psychiatry: Judgement and insight impaired. Mood & affect cannot be assessed.    Data Reviewed:   I have personally reviewed following labs and imaging studies   CBC: Recent Labs  Lab 10/29/23 0346 10/30/23 0926 10/30/23 1429 10/30/23 1817 11/01/23 0258  WBC 9.8 6.7  --   --  7.0  HGB 7.0* 8.9* 11.6* 9.9* 8.5*  HCT 21.2* 26.3* 34.0* 29.0* 25.2*  MCV 85.8 84.3  --   --  85.7  PLT 200 200  --   --  126*    Basic Metabolic Panel: Recent Labs  Lab 10/29/23 0346 10/29/23 2259 10/30/23 0426  10/30/23 0926 10/30/23 1429 10/30/23 1817 11/01/23 0258 11/01/23 1426 11/02/23 0429  NA 132*  --   --  135 135 135 132*  --  131*  K 3.8  --   --  3.6 3.9 3.5 3.5  --  4.5  CL 95*  --   --  100  --   --  101  --  97*  CO2 26  --   --  21*  --   --  21*  --  21*  GLUCOSE 137*  --   --  100*  --   --  260*  --  141*  BUN 5*  --   --  12  --   --  16  --  12  CREATININE 0.51  --   --  0.57  --   --  0.63  --  0.47  CALCIUM 8.1*  --   --  8.1*  --   --  7.4*  --  7.5*  MG 2.2 2.0 2.0  --   --   --  1.9 2.0 2.0  PHOS 3.0 1.9* 1.8*  --   --   --   --  <1.0* 3.6    Liver Function Tests: Recent  Labs  Lab 11/01/23 0258 11/02/23 0429  AST 28 31  ALT 18 20  ALKPHOS 105 154*  BILITOT 0.6 0.3  PROT 4.5* 4.9*  ALBUMIN  <1.5* <1.5*    CBG: Recent Labs  Lab 11/01/23 2314 11/02/23 0322 11/02/23 0724  GLUCAP 134* 94 151*    Microbiology Studies:   Recent Results (from the past 240 hours)  Culture, blood (Routine X 2) w Reflex to ID Panel     Status: None   Collection Time: 10/30/23  9:27 AM   Specimen: BLOOD LEFT HAND  Result Value Ref Range Status   Specimen Description BLOOD LEFT HAND  Final   Special Requests   Final    BOTTLES DRAWN AEROBIC AND ANAEROBIC Blood Culture results may not be optimal due to an inadequate volume of blood received in culture bottles   Culture   Final    NO GROWTH 5 DAYS Performed at [facility], [address], [place], [place] [address]    Report Status 11/04/2023 FINAL  Final  Culture, blood (Routine X 2) w Reflex to ID Panel     Status: None   Collection Time: 10/30/23  9:28 AM   Specimen: BLOOD LEFT HAND  Result Value Ref Range Status   Specimen Description BLOOD LEFT HAND  Final   Special Requests   Final    BOTTLES DRAWN AEROBIC AND ANAEROBIC Blood Culture results may not be optimal due to an inadequate volume of blood received in culture bottles   Culture   Final    NO GROWTH 5 DAYS Performed at [facility], [address] [place], [place] [address]    Report Status 11/04/2023 FINAL  Final  Aerobic/Anaerobic Culture w Gram Stain (surgical/deep wound)     Status: None   Collection Time: 10/30/23  2:05 PM   Specimen: Path Tissue  Result Value Ref Range Status   Specimen Description TISSUE  Final   Special Requests LEFT MEDIAL THIGH WOUND  Final   Gram Stain NO WBC SEEN FEW GRAM NEGATIVE RODS   Final   Culture   Final    MODERATE KLEBSIELLA OXYTOCA FEW ENTEROCOCCUS FAECALIS FEW PSEUDOMONAS AERUGINOSA NO ANAEROBES ISOLATED Performed at [facility], [address], [place], [place] [address]    Report Status 11/04/2023 FINAL  Final   Organism ID, Bacteria KLEBSIELLA OXYTOCA  Final   Organism ID, Bacteria ENTEROCOCCUS FAECALIS  Final   Organism ID, Bacteria PSEUDOMONAS AERUGINOSA  Final      Susceptibility   Enterococcus faecalis - MIC*    AMPICILLIN <=2 SENSITIVE Sensitive     VANCOMYCIN  1 SENSITIVE Sensitive     GENTAMICIN SYNERGY SENSITIVE Sensitive     * FEW ENTEROCOCCUS FAECALIS   Klebsiella oxytoca - MIC*    AMPICILLIN >=32 RESISTANT Resistant     CEFEPIME  <=0.12 SENSITIVE Sensitive     CEFTAZIDIME <=1 SENSITIVE Sensitive     CEFTRIAXONE  <=0.25 SENSITIVE Sensitive     CIPROFLOXACIN <=0.25 SENSITIVE Sensitive     GENTAMICIN <=1 SENSITIVE Sensitive     IMIPENEM <=0.25 SENSITIVE Sensitive     TRIMETH/SULFA <=20 SENSITIVE Sensitive     AMPICILLIN/SULBACTAM 8 SENSITIVE Sensitive     PIP/TAZO 16 SENSITIVE Sensitive ug/mL    * MODERATE KLEBSIELLA OXYTOCA   Pseudomonas aeruginosa - MIC*    CEFTAZIDIME <=1 SENSITIVE Sensitive     CIPROFLOXACIN <=0.25 SENSITIVE Sensitive     GENTAMICIN <=1 SENSITIVE Sensitive     IMIPENEM >=16 RESISTANT Resistant  PIP/TAZO <=4 SENSITIVE Sensitive ug/mL    CEFEPIME  0.5 SENSITIVE Sensitive     * FEW PSEUDOMONAS AERUGINOSA    Radiology Studies:  No results found.  Scheduled Meds:    Chlorhexidine  Gluconate Cloth  6 each Topical Daily     HYDROmorphone  (DILAUDID ) injection  0.5 mg Intravenous Q4H    Continuous Infusions:     LOS: 11 days     Trenda Mar, MD,  FACP, [facility], [facility], [facility]   [facility] [place]      To contact the attending provider between 7A-7P or the covering provider during after hours 7P-7A, please log into the web site [email or web address] and access using universal Joanna password for that web site. If you do not have the password, please call the hospital operator.  11/04/2023, 4:10 PM

## 2023-11-05 ENCOUNTER — Ambulatory Visit: Payer: Medicare HMO | Admitting: Internal Medicine

## 2023-11-05 DIAGNOSIS — Z515 Encounter for palliative care: Secondary | ICD-10-CM | POA: Diagnosis not present

## 2023-11-05 DIAGNOSIS — K5720 Diverticulitis of large intestine with perforation and abscess without bleeding: Secondary | ICD-10-CM | POA: Diagnosis not present

## 2023-11-05 DIAGNOSIS — M7989 Other specified soft tissue disorders: Secondary | ICD-10-CM | POA: Diagnosis not present

## 2023-11-05 DIAGNOSIS — K651 Peritoneal abscess: Secondary | ICD-10-CM | POA: Diagnosis not present

## 2023-11-05 DIAGNOSIS — M726 Necrotizing fasciitis: Secondary | ICD-10-CM | POA: Diagnosis not present

## 2023-11-05 MED ORDER — CARMEX CLASSIC LIP BALM EX OINT: TOPICAL_OINTMENT | CUTANEOUS | Status: DC | PRN

## 2023-11-05 MED ORDER — LORAZEPAM 2 MG/ML PO CONC: 1.0000 mg | ORAL | Status: DC | PRN

## 2023-11-05 MED ORDER — HYDROMORPHONE HCL 1 MG/ML IJ SOLN: 0.5000 mg | INTRAMUSCULAR | Status: DC

## 2023-11-05 MED ORDER — HYDROMORPHONE HCL 1 MG/ML IJ SOLN: 0.5000 mg | INTRAMUSCULAR | Status: DC | PRN

## 2023-11-05 MED ORDER — POLYVINYL ALCOHOL 1.4 % OP SOLN: 1.0000 [drp] | Freq: Four times a day (QID) | OPHTHALMIC | Status: DC | PRN

## 2023-11-05 NOTE — Plan of Care (Signed)
  Problem: Education: Goal: Ability to describe self-care measures that may prevent or decrease complications (Diabetes Survival Skills Education) will improve Outcome: Progressing   Problem: Coping: Goal: Ability to adjust to condition or change in health will improve Outcome: Progressing   Problem: Fluid Volume: Goal: Ability to maintain a balanced intake and output will improve Outcome: Progressing   Problem: Health Behavior/Discharge Planning: Goal: Ability to identify and utilize available resources and services will improve Outcome: Progressing Goal: Ability to manage health-related needs will improve Outcome: Progressing   Problem: Metabolic: Goal: Ability to maintain appropriate glucose levels will improve Outcome: Progressing   Problem: Nutritional: Goal: Maintenance of adequate nutrition will improve Outcome: Progressing   Problem: Skin Integrity: Goal: Risk for impaired skin integrity will decrease Outcome: Progressing   Problem: Tissue Perfusion: Goal: Adequacy of tissue perfusion will improve Outcome: Progressing   Problem: Fluid Volume: Goal: Hemodynamic stability will improve Outcome: Progressing   Problem: Clinical Measurements: Goal: Diagnostic test results will improve Outcome: Progressing Goal: Signs and symptoms of infection will decrease Outcome: Progressing   Problem: Respiratory: Goal: Ability to maintain adequate ventilation will improve Outcome: Progressing   Problem: Education: Goal: Knowledge of General Education information will improve Description: Including pain rating scale, medication(s)/side effects and non-pharmacologic comfort measures Outcome: Progressing   Problem: Health Behavior/Discharge Planning: Goal: Ability to manage health-related needs will improve Outcome: Progressing   Problem: Clinical Measurements: Goal: Ability to maintain clinical measurements within normal limits will improve Outcome: Progressing Goal:  Will remain free from infection Outcome: Progressing Goal: Diagnostic test results will improve Outcome: Progressing Goal: Respiratory complications will improve Outcome: Progressing Goal: Cardiovascular complication will be avoided Outcome: Progressing   Problem: Activity: Goal: Risk for activity intolerance will decrease Outcome: Progressing   Problem: Nutrition: Goal: Adequate nutrition will be maintained Outcome: Progressing   Problem: Coping: Goal: Level of anxiety will decrease Outcome: Progressing   Problem: Elimination: Goal: Will not experience complications related to bowel motility Outcome: Progressing Goal: Will not experience complications related to urinary retention Outcome: Progressing   Problem: Pain Managment: Goal: General experience of comfort will improve and/or be controlled Outcome: Progressing   Problem: Safety: Goal: Ability to remain free from injury will improve Outcome: Progressing   Problem: Skin Integrity: Goal: Risk for impaired skin integrity will decrease Outcome: Progressing   Problem: Education: Goal: Knowledge of the prescribed therapeutic regimen will improve Outcome: Progressing   Problem: Coping: Goal: Ability to identify and develop effective coping behavior will improve Outcome: Progressing   Problem: Clinical Measurements: Goal: Quality of life will improve Outcome: Progressing   Problem: Respiratory: Goal: Verbalizations of increased ease of respirations will increase Outcome: Progressing   Problem: Role Relationship: Goal: Family's ability to cope with current situation will improve Outcome: Progressing Goal: Ability to verbalize concerns, feelings, and thoughts to partner or family member will improve Outcome: Progressing   Problem: Pain Management: Goal: Satisfaction with pain management regimen will improve Outcome: Progressing

## 2023-11-05 NOTE — Progress Notes (Signed)
 Daily Progress Note   Patient Name: Ashley Galloway       Date: 11/05/2023 DOB: 05/21/1951  Age: 73 y.o. MRN#: [id number] Attending Physician: Judeth Trenda BIRCH, MD Primary Care Physician: Rexanne Ingle, MD Admit Date: 10/23/2023  Reason for Consultation/Follow-up: Establishing goals of care  Patient Profile/HPI:   73 y.o. female  with past medical history of HTN, dyslipidemia, PVD, COPD, recent Strep bacteremia, hepatic abscess admitted on 10/23/2023 with necrotizing fasciitis of LLE, LLQ abdominal abscess, and bacteremia. Has had drain placed for abscess and s/p excision and multiple debridements of LLE wound. Surgery has recommended definitive treatment with hip disarticulation and Hartman's procedure with colostomy placement. After discussions with surgery patient expressed that she did not want to proceed with surgery and was transitioned to full comfort measures only. She was referred to [facility] for inpt evaluation. Palliative consulted for symptom management and end of life care.   Subjective: Chart reviewed including labs, progress notes, imaging from this and previous encounters.  Spoke with patient's family outside of her room. Awaiting transport to  [facility].  On my eval patient awake, receiving personal care. Pain and symptoms well controlled with current interventions.    Review of Systems  Unable to perform ROS: Other     Physical Exam Vitals and nursing note reviewed.  Constitutional:      Appearance: She is ill-appearing.     Comments: frail  Neurological:     Comments: sleeping             Vital Signs: BP (!) 112/54 (BP Location: Right Arm)   Pulse 97   Temp 98.9 F (37.2 C) (Oral)   Resp 16   Ht 5' 5 (1.651 m)   Wt 85.8 kg   SpO2 100%   BMI  31.48 kg/m  SpO2: SpO2: 100 % O2 Device: O2 Device: Room Air O2 Flow Rate: O2 Flow Rate (L/min): 3 L/min  Intake/output summary:  Intake/Output Summary (Last 24 hours) at 11/05/2023 1250 Last data filed at 11/05/2023 0530 Gross per 24 hour  Intake --  Output 750 ml  Net -750 ml   LBM: Last BM Date : 10/31/23 Baseline Weight: Weight: 71.3 kg Most recent weight: Weight: 85.8 kg       Palliative Assessment/Data: PPS: 20%      Patient Active Problem  List   Diagnosis Date Noted   Intra-abdominal infection 11/02/2023   Gas gangrene of thigh (HCC) 10/30/2023   Necrotizing fasciitis of lower leg (HCC) 10/30/2023   Necrotizing fasciitis (HCC) 10/27/2023   Polymicrobial bacterial infection 10/27/2023   Necrotizing soft tissue infection 10/26/2023   Protein-calorie malnutrition, severe 10/25/2023   Infective myositis of left thigh 10/24/2023   AKI (acute kidney injury) (HCC) 10/24/2023   Diverticulitis of large intestine with abscess 10/24/2023   PAD (peripheral artery disease) (HCC) 10/24/2023   Hyperglycemia 10/24/2023   Streptococcal bacteremia 09/16/2023   Diverticulitis 09/15/2023   Lumbar stenosis 09/15/2023   Hepatic abscess 09/14/2023   Abnormal involuntary movement 08/14/2021   Anxiety 08/14/2021   Centrilobular emphysema (HCC) 08/14/2021   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  Total time:  25 minutes Prolonged billing:  Time includes:   Preparing to see the patient (e.g., review of tests) Obtaining and/or  reviewing separately obtained history Performing a medically necessary appropriate examination and/or evaluation Counseling and educating the patient/family/caregiver Ordering medications, tests, or procedures Referring and communicating with other health care professionals (when not reported separately) Documenting clinical information in the electronic or other health record Independently interpreting results (not reported separately) and communicating results to the patient/family/caregiver Care coordination (not reported separately) Clinical documentation  Cassondra Stain, AGNP-C Palliative Medicine   Please contact Palliative Medicine Team phone at [phone] for questions and concerns.

## 2023-11-05 NOTE — Progress Notes (Signed)
 This RN to bedside to DC CVC- Prior to removing line Primary RN came in stating to leave line in place for [place] place to use. Of note- dressing remains intact, due for change on 2/2, will defer dressing d/t comfort care.

## 2023-11-05 NOTE — TOC Progression Note (Addendum)
 Transition of Care [facility]) - Progression Note    Patient Details  Name: Ashley Galloway MRN: [id number] Date of Birth: 1951/09/09  [facility]) CM/SW Contact  Stephane Powell Jansky, RN Phone Number: 11/05/2023, 11:38 AM  Clinical Narrative:     Family has accepted a bed at [facility]. Consents have been signed . PTAR paperwork and signed DNR form on chart . PTAR called.   Shawn with [employer] wants central line to remain in . PTAR called   Expected Discharge Plan: Home w Hospice Care Barriers to Discharge: Continued Medical Work up  Expected Discharge Plan and Services In-house Referral: Clinical Social Work Discharge Planning Services: CM Consult Post Acute Care Choice: Hospice Living arrangements for the past 2 months: Single Family Home Expected Discharge Date: 11/05/23               DME Arranged: N/A DME Agency: NA         HH Agency: NA         Social Determinants of Health (SDOH) Interventions SDOH Screenings   Food Insecurity: Patient Unable To Answer (10/26/2023)  Housing: Patient Unable To Answer (10/26/2023)  Transportation Needs: Patient Unable To Answer (10/26/2023)  Utilities: Patient Unable To Answer (10/26/2023)  Depression (PHQ2-9): Low Risk  (10/08/2023)  Social Connections: Patient Unable To Answer (10/26/2023)  Tobacco Use: High Risk (10/30/2023)    Readmission Risk Interventions    10/26/2023    2:07 PM  Readmission Risk Prevention Plan  Transportation Screening Complete  PCP or Specialist Appt within 3-5 Days Complete  HRI or Home Care Consult Complete  Social Work Consult for Recovery Care Planning/Counseling Complete  Palliative Care Screening Not Applicable  Medication Review Oceanographer) Complete

## 2023-11-05 NOTE — Discharge Summary (Signed)
 Physician Discharge Summary  Ashley Galloway FMW:[id number] DOB: 06-24-1951  PCP: Rexanne Ingle, MD  Admitted from: Home Discharged to: [name]  Admit date: 10/23/2023 Discharge date: 11/05/2023  Recommendations for Outpatient Follow-up:    Follow-up Information     Harden Jerona GAILS, MD Follow up.   Specialty: Orthopedic Surgery Why: FYI. Patient discharging to [facility] for EOL hospice care Contact information: [address] [place] [address] [phone]         Rexanne Ingle, MD Follow up.   Specialty: Internal Medicine Why: FYI.  Patient discharging to [facility] for EOL hospice care Contact information: [address] [place] [address] [phone]         Darice Aho, NP Follow up.   Why: Follow-up for hospice care at [facility].                 Home Health: N/A    Equipment/Devices: Patient will DC with Foley catheter for comfort care.    Discharge Condition: Overall poor prognosis and expected life expectancy <2 weeks.   Code Status: Do not attempt resuscitation (DNR) - Comfort care Diet recommendation:  Discharge Diet Orders (From admission, onward)     Start     Ordered   11/05/23 0000  Diet general        11/05/23 1054             Discharge Diagnoses:  Principal Problem:   Diverticulitis of large intestine with abscess Active Problems:   Infective myositis of left thigh   AKI (acute kidney injury) (HCC)   PAD (peripheral artery disease) (HCC)   Hyperglycemia   Protein-calorie malnutrition, severe   Necrotizing soft tissue infection   Necrotizing fasciitis (HCC)   Polymicrobial bacterial infection   Gas gangrene of thigh (HCC)   Necrotizing fasciitis of lower leg (HCC)   Intra-abdominal infection   Brief Hospital Course:  73 y.o. female with HTN, dyslipidemia, PVD, COPD, tobacco smoking (quit few months ago), recent Streptococcus intermedius bacteremia, hepatic abscess  (percutaneous drain d/c on 10/20/2022), and diverticulitis in Dec 2024 who completed antibiotics few days PTA. She presented to ER 10/24/2023 in am with c/o LLQ swelling and erythema, and left thigh pain for few days. She had fever, chills, general malaise and loss of appetite, but denies chest pain, cough, SOB, wheezing, or N/V/D. She reported constipation. New CT imaging showed new large LLQ abscess with associated diverticulitis as well as fluid/air into abductor muscle concerning for myositis. She was admitted to ICU under PCCM.  She had drain placed by IR for abdominal abscess.  Underwent excision and debridement of left lower extremity with wound VAC placement on 1/25 and debridement again on 1/31.  Surgery recommended further extensive interventions as noted below but patient opted to transition to full comfort care on 2/3.  Palliative care medicine and TOC consulted.  Patient and family eventually opted for DC to [facility].       Assessment & Plan:    Septic shock secondary to intra-abdominal abscess and left thigh necrotizing fascitis - shock resolved S/p CT guided drain placement 1/25 for abdominal abscess S/p excision and debridement of LLE with wound vac placement 1/25, debridement 1/31  Staph/strep Bacteremia    As per review of prior records, on 2/4, goals of care for patient were discussed. Surgery discussed surgical options including open Hartman's resection and permanent colostomy in addition to needing hip disarticulation with Ortho at a later point.  At this  point, patient opted to transition to full comfort care.   Communicated with palliative care team 2/5.    This MD discussed in detail with patient, spouse and multiple other family members in the room this morning.  They have all decided on going to [facility] for end-of-life care.   Body mass index is 31.48 kg/m./Obesity   Nutritional Status Nutrition Problem: Severe Malnutrition Etiology: acute illness  (intra-abdominal abscess/diverticulitis and left thigh necrotizing fascitis) Signs/Symptoms: mild fat depletion, mild muscle depletion, energy intake < or equal to 50% for > or equal to 5 days, percent weight loss (17% in less than 1 month) Percent weight loss: 17 % (in less than 1 month) Interventions: Refer to RD note for recommendations, Ensure Enlive (each supplement provides 350kcal and 20 grams of protein), MVI, Tube feedin  Consultants:   General surgery Interventional radiology Orthopedics Critical care medicine Infectious disease Palliative care medicine   Procedures:   As noted above   Discharge Instructions  Discharge Instructions     Call MD for:  difficulty breathing, headache or visual disturbances   Complete by: As directed    Call MD for:  extreme fatigue   Complete by: As directed    Call MD for:  persistant dizziness or light-headedness   Complete by: As directed    Call MD for:  persistant nausea and vomiting   Complete by: As directed    Call MD for:  redness, tenderness, or signs of infection (pain, swelling, redness, odor or green/yellow discharge around incision site)   Complete by: As directed    Call MD for:  severe uncontrolled pain   Complete by: As directed    Call MD for:  temperature >100.4   Complete by: As directed    Diet general   Complete by: As directed    Discharge instructions   Complete by: As directed    Patient will discharge with Foley catheter in place for comfort.   Discharge wound care:   Complete by: As directed    As recommended below by [facility] RN from the hospital or as per protocol at [facility]  Wound care  Daily      Comments: Bedside nurse; change dressings Q day as follows: 2. Moisten all previous dressings with NS to assist with removal and pull off slowly.  3. Apply Xeroform gauze to any exposed muscles and tendon areas in the wound bed, then apply moist fluffed kerlex to all wounds to left leg, inner left groin, and  outer left thigh.  4. Cover with ABD pads 5. Cover left leg and knee with ace wrap to hold ABD pads in place.  6. Hold inner groin and left outer thigh dressings in place with mesh underwear with the crotch cut out, or tape dressings in place instead.   Increase activity slowly   Complete by: As directed         Medication List     STOP taking these medications    ALPRAZolam 0.25 MG tablet Commonly known as: XANAX   aspirin EC 81 MG tablet   cefadroxil  500 MG capsule Commonly known as: DURICEF   cefTRIAXone  2 g injection Commonly known as: ROCEPHIN    Centrum Silver Women 50+ Tabs   HYDROcodone-acetaminophen  5-325 MG tablet Commonly known as: NORCO/VICODIN   Klor-Con  M20 20 MEQ tablet Generic drug: potassium chloride  SA   latanoprost 0.005 % ophthalmic solution Commonly known as: XALATAN   lovastatin 20 MG tablet Commonly known as: MEVACOR  methocarbamol  500 MG tablet Commonly known as: ROBAXIN    ondansetron  4 MG disintegrating tablet Commonly known as: ZOFRAN -ODT   predniSONE 10 MG (21) Tbpk tablet Commonly known as: STERAPRED UNI-PAK 21 TAB   PROBIOTIC PO   timolol 0.5 % ophthalmic solution Commonly known as: TIMOPTIC   traMADol 50 MG tablet Commonly known as: ULTRAM   traZODone 50 MG tablet Commonly known as: DESYREL   triamterene-hydrochlorothiazide 37.5-25 MG capsule Commonly known as: DYAZIDE       TAKE these medications    HYDROmorphone  1 MG/ML injection Commonly known as: DILAUDID  Inject 0.5 mLs (0.5 mg total) into the vein every 4 (four) hours.   HYDROmorphone  1 MG/ML injection Commonly known as: DILAUDID  Inject 0.5 mLs (0.5 mg total) into the vein every 30 (thirty) minutes as needed for severe pain (pain score 7-10) or moderate pain (pain score 4-6) (any discomfort or dyspnea).   lip balm ointment Apply topically as needed.   LORazepam  2 MG/ML concentrated solution Commonly known as: ATIVAN  Place 0.5 mLs (1 mg total) under the  tongue every 4 (four) hours as needed for anxiety.   polyvinyl alcohol  1.4 % ophthalmic solution Commonly known as: LIQUIFILM TEARS Place 1 drop into both eyes 4 (four) times daily as needed for dry eyes.       Allergies  Allergen Reactions   Tape Other (See Comments)    Tape from hormone patch.      Procedures/Studies: CT ABDOMEN PELVIS W CONTRAST Result Date: 11/01/2023 CLINICAL DATA:  Preop, hepatic abscess, diverticulitis. EXAM: CT ABDOMEN AND PELVIS WITH CONTRAST TECHNIQUE: Multidetector CT imaging of the abdomen and pelvis was performed using the standard protocol following bolus administration of intravenous contrast. RADIATION DOSE REDUCTION: This exam was performed according to the departmental dose-optimization program which includes automated exposure control, adjustment of the mA and/or kV according to patient size and/or use of iterative reconstruction technique. CONTRAST:  75mL OMNIPAQUE  IOHEXOL  350 MG/ML SOLN COMPARISON:  CT pelvis 10/24/2023 and CT abdomen pelvis 10/24/2023. FINDINGS: Lower chest: Small bilateral pleural effusions, left greater than right. Mild collapse/consolidation in the left lower lobe. Central line terminates in the right atrium. Atherosclerotic calcification of the aorta and coronary arteries. Heart is enlarged. No pericardial effusion. Feeding tube in the distal esophagus. Hepatobiliary: Similar biliary ductal dilatation after cholecystectomy. Liver is otherwise unremarkable. Pancreas: Negative. Spleen: Negative. Adrenals/Urinary Tract: Adrenal glands are unremarkable. Small low-attenuation lesion in the right kidney. No specific follow-up necessary. Kidneys are otherwise unremarkable. Ureters are decompressed. Bladder is low in volume with a Foley catheter in place. Stomach/Bowel: Feeding tube terminates in the stomach. Stomach and small bowel are otherwise unremarkable. Right hemicolectomy. Majority of the colon is otherwise unremarkable. There is extensive  thickening of the sigmoid colon with an exophytic stool containing collection arising from the posterior wall of the sigmoid, measuring 7.1 x 8.8 cm (3/85). It contains a percutaneous drain and has enlarged from 6.4 x 7.5 cm on 10/24/2023. Unfortunately, there is stool and air extending from the left lateral aspect of this collection (3/84) with extension superiorly along the left iliac fossa and anterior to the left psoas muscle, roughly measuring 6.8 x 13.1 cm (3/59). Extensive surrounding air in the left retroperitoneum, small bowel mesentery and minimally within the right retroperitoneum. Vascular/Lymphatic: Atherosclerotic calcification of the aorta. No pathologically enlarged lymph nodes. Reproductive: Uterus is visualized.  No adnexal mass. Other: Small loculated left adnexal fluid. Musculoskeletal: Extensive muscular edema and subcutaneous air in the upper left thigh, incompletely visualized. Open wound  along the posterolateral left thigh (3/117). Degenerative changes in the spine. IMPRESSION: 1. Enlarging collection of fecal material along the posterior margin of the sigmoid, with drain in place. New extension of fecal matter and air superiorly, along the left iliac fossa and left psoas muscle, with air dissecting across the midline into the right retroperitoneum. 2. Partially imaged necrotizing fasciitis in the upper left thigh with an open wound along the left posterolateral thigh. 3. Small bilateral pleural effusions. 4. Aortic atherosclerosis (ICD10-I70.0). Coronary artery calcification. Electronically Signed   By: Newell Eke M.D.   On: 11/01/2023 18:14   DG Abd 1 View Result Date: 10/28/2023 CLINICAL DATA:  [id number] Encounter for nasogastric (NG) tube placement [id number] EXAM: ABDOMEN - 1 VIEW COMPARISON:  None Available. FINDINGS: A weighted tip enteric feeding tube is present. The tip of the tube is in the region of the pylorus. No evidence of bowel obstruction. Surgical clips present in the right  upper quadrant. Low lung volumes. IMPRESSION: The weighted tip of the enteric feeding tube is in the region of the pylorus. Electronically Signed   By: Wilkie Lent M.D.   On: 10/28/2023 14:50   DG Chest 1 View Result Date: 10/24/2023 CLINICAL DATA:  Dyspnea.  Central line placement. EXAM: CHEST  1 VIEW COMPARISON:  Radiographs yesterday, chest CT earlier today FINDINGS: Left internal jugular central venous catheter tip overlies the right atrium, partially obscured by overlying monitoring devices. No pneumothorax. Overall low lung volumes. No focal airspace disease, large pleural effusion or pulmonary edema. The heart is normal in size. IMPRESSION: Left internal jugular central venous catheter tip overlies the right atrium. No pneumothorax. Electronically Signed   By: Andrea Gasman M.D.   On: 10/24/2023 20:47   CT GUIDED PERITONEAL/RETROPERITONEAL FLUID DRAIN BY PERC CATH Result Date: 10/24/2023 CLINICAL DATA:  Diverticular abscess EXAM: CT GUIDED DRAINAGE OF PELVIC ABSCESS ANESTHESIA/SEDATION: Intravenous Fentanyl  50mcg and Versed  1mg  were administered by RN during a total moderate (conscious) sedation time of 20 minutes; the patient's level of consciousness and physiological / cardiorespiratory status were monitored continuously by radiology RN under my direct supervision. PROCEDURE: The procedure, risks, benefits, and alternatives were explained to the patient. Questions regarding the procedure were encouraged and answered. The patient understands and consents to the procedure. select axial scans through the pelvis were obtained. the complex left pelvic fluid collection abutting the urinary bladder was localized and an appropriate skin entry site was determined and marked. The operative field was prepped with chlorhexidinein a sterile fashion, and a sterile drape was applied covering the operative field. A sterile gown and sterile gloves were used for the procedure. Local anesthesia was provided  with 1% Lidocaine . Under CT fluoroscopic guidance, 18 gauge trocar needle advanced to the collection. Amplatz guidewire advanced easily within the collection, confirmed on CT fluoroscopy. Tract dilated to facilitate placement of a 12 French pigtail drain catheter, formed centrally within the collection. A 10 mL of feculent material were aspirated, sent for Gram stain and culture. Catheter secured externally 0 Prolene sutures and StatLock and placed to gravity drain bag. The patient tolerated the procedure well. RADIATION DOSE REDUCTION: This exam was performed according to the departmental dose-optimization program which includes automated exposure control, adjustment of the mA and/or kV according to patient size and/or use of iterative reconstruction technique. COMPLICATIONS: None immediate FINDINGS: Complex gas and debris filled collection in the low left pelvis abutting the urinary bladder is again localized. Deep gas in the fascial planes of the proximal left thigh  also noted, distal extent not visualized. 12 French pigtail drain catheter placed as above. Feculent aspirate sent for Gram stain and culture. IMPRESSION: 1. Technically successful CT-guided pelvic abscess drain catheter placement. Feculent aspirate sent for Gram stain and culture. 2. Gas bubbles dissecting through deep muscular and fascial planes of the proximal left thigh. This finding was telephoned to Dr. Lue at the beginning of the procedure. Electronically Signed   By: JONETTA Faes M.D.   On: 10/24/2023 19:22   CT EXTREMITY LOWER LEFT WO CONTRAST Result Date: 10/24/2023 CLINICAL DATA:  Recent drain placement and abdomen. Scan due to pain, swelling, and subcutaneous free air. Evaluate for necrotizing fasciitis. EXAM: CT OF THE LOWER LEFT EXTREMITY WITHOUT CONTRAST TECHNIQUE: Multidetector CT imaging of the lower left extremity was performed according to the standard protocol. RADIATION DOSE REDUCTION: This exam was performed according to  the departmental dose-optimization program which includes automated exposure control, adjustment of the mA and/or kV according to patient size and/or use of iterative reconstruction technique. COMPARISON:  CT chest, abdomen, and pelvis 10/24/2023 FINDINGS: Bones/Joint/Cartilage No cortical erosion is seen.  No acute fracture. There are individual screws within the distal aspect of the first and fifth metatarsals. Ligaments Suboptimally assessed by CT. Muscles and Tendons There is again air seen tracking along the sartorius and the left groin musculature as on CT of the chest, abdomen, and pelvis earlier today. There is extension of this air within the fascial planes surrounding these muscles throughout the more distal thigh, and air is seen around fascial planes within the sartorius, anterior left quadriceps, obturator externus, adductor longus, brevis, and magnus. Within the mid to distal thigh air surrounds the hamstring fascial planes diffusely. Within the calf, air surrounds the fascial planes of the medial greater than lateral heads of the gastrocnemius muscles to the level of the ankle. Findings are concerning for necrotizing fasciitis diffusely throughout the left lower leg. Within the thigh, although the anterior superficial portion of the quadriceps musculature appears affected, and the deep fascial planes of the quadriceps musculature do not demonstrate air. Within the calf, the anterior compartment and deep posterior compartment appear less affected. Soft tissues There is moderate diffuse subcutaneous fat edema and swelling throughout the left thigh and calf. There is density likely representing confluent edema lateral to the knee (axial series 6, image 319 and coronal series 9, image 118). No walled-off abscess is seen. There is a new anterior left pelvic approach drainage catheter terminating within the region of the complex collection containing air and fluid within left hemipelvis on CT chest,  abdomen, pelvis earlier today. That suspected abscess adjacent to the wall thickening within the sigmoid colon is markedly decreased from prior. A Foley catheter is seen within the urinary bladder which contains contrast. IMPRESSION: 1. There is extensive air tracking along the fascial planes of the left hip, groin, thigh, and posterior calf musculature concerning for diffuse necrotizing fasciitis extending down to the level of the ankle. 2. There is moderate diffuse subcutaneous fat edema and swelling throughout the left thigh and calf. There is density likely representing confluent edema lateral to the knee. No walled-off abscess is seen. 3. There is a new anterior left pelvic approach drainage catheter terminating within the region of the complex collection containing air and fluid within left hemipelvis on CT chest, abdomen, pelvis earlier today. That suspected abscess adjacent to the wall thickening within the sigmoid colon is markedly decreased from prior. Critical Value/emergent results were called by telephone at the time  of interpretation on 10/24/2023 at 6:12 pm to provider ELSIE MONTCLAIR , who verbally acknowledged these results. Electronically Signed   By: Tanda Lyons M.D.   On: 10/24/2023 18:15   CT Angio Chest/Abd/Pel for Dissection W and/or Wo Contrast Result Date: 10/24/2023 CLINICAL DATA:  Septic arterial embolism. Left leg pain and swelling. History of liver abscess drained on 08/20/2024. EXAM: CT ANGIOGRAPHY CHEST, ABDOMEN AND PELVIS TECHNIQUE: Non-contrast CT of the chest was initially obtained. Multidetector CT imaging through the chest, abdomen and pelvis was performed using the standard protocol during bolus administration of intravenous contrast. Multiplanar reconstructed images and MIPs were obtained and reviewed to evaluate the vascular anatomy. RADIATION DOSE REDUCTION: This exam was performed according to the departmental dose-optimization program which includes automated exposure  control, adjustment of the mA and/or kV according to patient size and/or use of iterative reconstruction technique. CONTRAST:  OMNIPAQUE  IOHEXOL  350 MG/ML SOLN COMPARISON:  CT abdomen 10/21/2023. CT abdomen and pelvis 09/14/2023. FINDINGS: CTA CHEST FINDINGS Cardiovascular: Preferential opacification of the thoracic aorta. No evidence of thoracic aortic aneurysm or dissection. Normal heart size. No pericardial effusion. There is calcified atherosclerotic disease throughout the aorta. Bovine arch anatomy is noted. Mediastinum/Nodes: No enlarged mediastinal, hilar, or axillary lymph nodes. Thyroid  gland, trachea, and esophagus demonstrate no significant findings. Lungs/Pleura: Are minimal atelectatic changes in the bilateral lower lobes and lingula. There is no pleural effusion or pneumothorax. Musculoskeletal: No chest wall abnormality. No acute or significant osseous findings. Review of the MIP images confirms the above findings. CTA ABDOMEN AND PELVIS FINDINGS VASCULAR Aorta: Normal caliber aorta without aneurysm, dissection, vasculitis or significant stenosis. There is calcified atherosclerotic disease throughout the aorta. 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 Otherwise, the celiac artery and its branches appear within normal limits. SMA: Patent without evidence of aneurysm, dissection, vasculitis or significant stenosis. Renals: There is mild-to-moderate focal stenosis of the origin of the left renal artery secondary to atherosclerotic disease. Renal arteries are otherwise within normal limits. IMA: Patent without evidence of aneurysm, dissection, vasculitis or significant stenosis. Inflow: Patent without evidence of aneurysm, dissection, vasculitis or significant stenosis. Calcified atherosclerosis is present. Other: There is moderate focal stenosis in the proximal left superficial femoral artery. There severe focal stenosis and possible focal  thrombosis in the proximal right profunda femoral artery. Veins: No obvious venous abnormality within the limitations of this arterial phase study. Review of the MIP images confirms the above findings. NON-VASCULAR Hepatobiliary: Percutaneous drainage catheter has been removed. Ill-defined heterogeneous hypodensity is again seen in the central liver measuring proximally 5.9 x 3.8 cm similar to the prior examination. Rounded hypodensity in the dome of the liver is unchanged and favored as a cyst. Gallbladder is surgically absent. There stable common bile duct dilatation. Pancreas: Unremarkable. No pancreatic ductal dilatation or surrounding inflammatory changes. Spleen: Normal in size without focal abnormality. Adrenals/Urinary Tract: There some bilateral adrenal thickening which is new from prior. There is no hydronephrosis or perinephric stranding. There is a cyst in the superior pole the right kidney measuring 1.7 cm. Bladder is within normal limits. Stomach/Bowel: There is no evidence for bowel obstruction, pneumatosis or free air. There is diffuse colonic diverticulosis. There is a large amount of stool throughout the colon. The appendix is not seen. There is wall thickening of the mid sigmoid colon without surrounding inflammatory stranding. There is a new collection containing presumed fecal matter and air in the left lower quadrant measuring 6.8  x 6.2 by 6.9 cm. This is inferior to and abutting the sigmoid colon without surrounding inflammation. Small bowel and stomach are within normal limits. Lymphatic: No enlarged lymph nodes are identified. Reproductive: Small calcified fibroids are present. Adnexa are within normal limits. Other: No abdominal wall hernia or abnormality. No abdominopelvic ascites. Musculoskeletal: No acute osseous abnormality. There is soft tissue gas and edema within the abductor thigh musculature and anterior thigh musculature. Review of the MIP images confirms the above findings.  IMPRESSION: VASCULAR 1. No evidence for aortic dissection or aneurysm. 2. 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 3. Moderate focal stenosis in the proximal left superficial femoral artery. 4. Severe focal stenosis and possible focal thrombosis in the proximal right profunda femoral artery. Aortic Atherosclerosis (ICD10-I70.0). NON_VASCULAR 1. New 6.9 cm collection containing presumed fecal matter and air in the left lower quadrant abutting the sigmoid colon. Findings may represent a contained perforation of diverticulum with abscess formation. Please correlate clinically. 2. Wall thickening of the mid sigmoid colon without surrounding inflammatory stranding. Findings may be related to colitis/diverticulitis. Underlying lesion can not be excluded. 3. Large amount of stool throughout the colon. 4. Soft tissue gas and edema within the left abductor thigh musculature and anterior thigh musculature worrisome for infection/myositis/fasciitis including necrotizing fasciitis. 5. Stable ill-defined hypodensity in the central liver favored as abscess. 6. New bilateral adrenal thickening. These results were called by telephone at the time of interpretation on 10/24/2023 at 1:38 am to provider APRIL PALUMBO , who verbally acknowledged these results. Electronically Signed   By: Greig Pique M.D.   On: 10/24/2023 01:41   DG Chest Port 1 View Result Date: 10/23/2023 CLINICAL DATA:  Questionable sepsis EXAM: PORTABLE CHEST 1 VIEW COMPARISON:  Chest x-ray 09/14/2023 FINDINGS: The heart size and mediastinal contours are within normal limits. Both lungs are clear. The visualized skeletal structures are unremarkable. IMPRESSION: No active disease. Electronically Signed   By: Greig Pique M.D.   On: 10/23/2023 23:49   CT ABDOMEN W CONTRAST Result Date: 10/21/2023 CLINICAL DATA:  73 year old female with history of hepatic abscess status post percutaneous drain placement on 09/16/2023 presenting for initial  drain follow-up. EXAM: CT ABDOMEN WITH CONTRAST TECHNIQUE: Multidetector CT imaging of the abdomen was performed using the standard protocol following bolus administration of intravenous contrast. RADIATION DOSE REDUCTION: This exam was performed according to the departmental dose-optimization program which includes automated exposure control, adjustment of the mA and/or kV according to patient size and/or use of iterative reconstruction technique. CONTRAST:  75mL OMNIPAQUE  IOHEXOL  350 MG/ML SOLN COMPARISON:  09/14/2023, 09/15/2023 FINDINGS: Lower chest: No acute abnormality. Hepatobiliary: Significantly decreased size of previously visualized right lobe hepatic abscess, now with ill-defined borders measuring approximately 4.9 x 5.0 x 3.4 cm (AP by trans by CC, previously measuring up to approximately 12.5 cm. Percutaneous hepatic fluid collection pigtail drainage catheter in unchanged position. Unchanged right hepatic dome subcentimeter simple hepatic cyst. The liver is otherwise within normal limits. Postsurgical changes after cholecystectomy. Mild compensatory extrahepatic biliary ductal dilation. Pancreas: Unremarkable. No pancreatic ductal dilatation or surrounding inflammatory changes. Spleen: Normal in size without focal abnormality. Adrenals/Urinary Tract: Adrenal glands are unremarkable. Unchanged simple cyst in the right kidney, not requiring additional follow-up. Kidneys are normal, without renal calculi, focal lesion, or hydronephrosis. Bladder is unremarkable. Stomach/Bowel: Stomach is within normal limits. Scattered colonic diverticula, most prominent in the visualized proximal descending colon. Evidence of bowel wall thickening, distention, or inflammatory changes. Vascular/Lymphatic: Aortic atherosclerosis. No enlarged abdominal or  pelvic lymph nodes. Other: Similar appearing small, uncomplicated umbilical hernia. Musculoskeletal: No acute osseous abnormality. Similar appearing multilevel  degenerative changes of the visualized thoracolumbar spine. IMPRESSION: 1. Significantly decreased size and decreased conspicuity of previously visualized right hepatic abscess with unchanged position of indwelling percutaneous drainage catheter. 2. Diverticulosis. 3. Aortic atherosclerosis. Aortic Atherosclerosis (ICD10-I70.0). Ester Sides, MD [facility] Electronically Signed   By: Ester Sides M.D.   On: 10/21/2023 21:54      Subjective: Patient interviewed and examined along with her spouse and other family members in the room.  She is alert and oriented x 2.  Still appears somewhat confused.  States that she has a bad odor emanating from her left lower extremity but denied pain and denied any other complaints.  Family indicated that her left lower extremity dressing was changed very early this morning by the night nurse.  Discharge Exam:  Vitals:   11/03/23 1600 11/03/23 1753 11/03/23 2000 11/05/23 0528  BP:   (!) 107/55 (!) 112/54  Pulse:   98 97  Resp: (!) 23 (!) 26 18 16   Temp: 100 F (37.8 C) (!) 101.1 F (38.4 C) 98.9 F (37.2 C)   TempSrc:   Oral   SpO2:   99% 100%  Weight:      Height:        General exam: Elderly female, moderately built and frail lying comfortably propped up in bed without distress. Respiratory system: Clear to auscultation. Respiratory effort normal. Cardiovascular system: S1 & S2 heard, RRR. No JVD, murmurs, rubs, gallops or clicks. No pedal edema.  Not on telemetry. Gastrointestinal system: Abdomen is nondistended, soft and nontender. No organomegaly or masses felt. Normal bowel sounds heard. Central nervous system: Mental status as noted above. No focal neurological deficits. Extremities: Left lower extremity with extensive dressing which appears clean, dry and intact.  There is some swelling of the bedsheet underneath which may be old from previous dressing. Skin: No rashes, lesions or  ulcers Psychiatry: Judgement and insight impaired but better than yesterday. Mood & affect cannot be assessed    The results of significant diagnostics from this hospitalization (including imaging, microbiology, ancillary and laboratory) are listed below for reference.     Microbiology: Recent Results (from the past 240 hours)  Culture, blood (Routine X 2) w Reflex to ID Panel     Status: None   Collection Time: 10/30/23  9:27 AM   Specimen: BLOOD LEFT HAND  Result Value Ref Range Status   Specimen Description BLOOD LEFT HAND  Final   Special Requests   Final    BOTTLES DRAWN AEROBIC AND ANAEROBIC Blood Culture results may not be optimal due to an inadequate volume of blood received in culture bottles   Culture   Final    NO GROWTH 5 DAYS Performed at [facility], [address], [place], [place] [address]    Report Status 11/04/2023 FINAL  Final  Culture, blood (Routine X 2) w Reflex to ID Panel     Status: None   Collection Time: 10/30/23  9:28 AM   Specimen: BLOOD LEFT HAND  Result Value Ref Range Status   Specimen Description BLOOD LEFT HAND  Final   Special Requests   Final    BOTTLES DRAWN AEROBIC AND ANAEROBIC Blood Culture results may not be optimal due to an inadequate volume of blood received in culture bottles   Culture   Final    NO GROWTH 5 DAYS  Performed at [facility], [address], [place], [place] [address]    Report Status 11/04/2023 FINAL  Final  Aerobic/Anaerobic Culture w Gram Stain (surgical/deep wound)     Status: None   Collection Time: 10/30/23  2:05 PM   Specimen: Path Tissue  Result Value Ref Range Status   Specimen Description TISSUE  Final   Special Requests LEFT MEDIAL THIGH WOUND  Final   Gram Stain NO WBC SEEN FEW GRAM NEGATIVE RODS   Final   Culture   Final    MODERATE KLEBSIELLA OXYTOCA FEW ENTEROCOCCUS FAECALIS FEW PSEUDOMONAS AERUGINOSA NO ANAEROBES ISOLATED Performed at [facility], [address]  [place], [place] [address]    Report Status 11/04/2023 FINAL  Final   Organism ID, Bacteria KLEBSIELLA OXYTOCA  Final   Organism ID, Bacteria ENTEROCOCCUS FAECALIS  Final   Organism ID, Bacteria PSEUDOMONAS AERUGINOSA  Final      Susceptibility   Enterococcus faecalis - MIC*    AMPICILLIN <=2 SENSITIVE Sensitive     VANCOMYCIN  1 SENSITIVE Sensitive     GENTAMICIN SYNERGY SENSITIVE Sensitive     * FEW ENTEROCOCCUS FAECALIS   Klebsiella oxytoca - MIC*    AMPICILLIN >=32 RESISTANT Resistant     CEFEPIME  <=0.12 SENSITIVE Sensitive     CEFTAZIDIME <=1 SENSITIVE Sensitive     CEFTRIAXONE  <=0.25 SENSITIVE Sensitive     CIPROFLOXACIN <=0.25 SENSITIVE Sensitive     GENTAMICIN <=1 SENSITIVE Sensitive     IMIPENEM <=0.25 SENSITIVE Sensitive     TRIMETH/SULFA <=20 SENSITIVE Sensitive     AMPICILLIN/SULBACTAM 8 SENSITIVE Sensitive     PIP/TAZO 16 SENSITIVE Sensitive ug/mL    * MODERATE KLEBSIELLA OXYTOCA   Pseudomonas aeruginosa - MIC*    CEFTAZIDIME <=1 SENSITIVE Sensitive     CIPROFLOXACIN <=0.25 SENSITIVE Sensitive     GENTAMICIN <=1 SENSITIVE Sensitive     IMIPENEM >=16 RESISTANT Resistant     PIP/TAZO <=4 SENSITIVE Sensitive ug/mL    CEFEPIME  0.5 SENSITIVE Sensitive     * FEW PSEUDOMONAS AERUGINOSA     Labs: CBC: Recent Labs  Lab 10/30/23 0926 10/30/23 1429 10/30/23 1817 11/01/23 0258  WBC 6.7  --   --  7.0  HGB 8.9* 11.6* 9.9* 8.5*  HCT 26.3* 34.0* 29.0* 25.2*  MCV 84.3  --   --  85.7  PLT 200  --   --  126*    Basic Metabolic Panel: Recent Labs  Lab 10/29/23 2259 10/30/23 0426 10/30/23 0926 10/30/23 1429 10/30/23 1817 11/01/23 0258 11/01/23 1426 11/02/23 0429  NA  --   --  135 135 135 132*  --  131*  K  --   --  3.6 3.9 3.5 3.5  --  4.5  CL  --   --  100  --   --  101  --  97*  CO2  --   --  21*  --   --  21*  --  21*  GLUCOSE  --   --  100*  --   --  260*  --  141*  BUN  --   --  12  --   --  16  --  12  CREATININE  --   --  0.57  --   --  0.63  --  0.47   CALCIUM  --   --  8.1*  --   --  7.4*  --  7.5*  MG 2.0 2.0  --   --   --  1.9 2.0 2.0  PHOS 1.9* 1.8*  --   --   --   --  <1.0* 3.6    Liver Function Tests: Recent Labs  Lab 11/01/23 0258 11/02/23 0429  AST 28 31  ALT 18 20  ALKPHOS 105 154*  BILITOT 0.6 0.3  PROT 4.5* 4.9*  ALBUMIN  <1.5* <1.5*    CBG: Recent Labs  Lab 11/01/23 1510 11/01/23 1925 11/01/23 2314 11/02/23 0322 11/02/23 0724  GLUCAP 119* 93 134* 94 151*     Urinalysis    Component Value Date/Time   COLORURINE YELLOW 10/24/2023 0041   APPEARANCEUR HAZY (A) 10/24/2023 0041   LABSPEC 1.026 10/24/2023 0041   PHURINE 5.0 10/24/2023 0041   GLUCOSEU NEGATIVE 10/24/2023 0041   HGBUR NEGATIVE 10/24/2023 0041   BILIRUBINUR NEGATIVE 10/24/2023 0041   KETONESUR NEGATIVE 10/24/2023 0041   PROTEINUR 30 (A) 10/24/2023 0041   NITRITE NEGATIVE 10/24/2023 0041   LEUKOCYTESUR NEGATIVE 10/24/2023 0041      Time coordinating discharge: 35 minutes  SIGNED:  Trenda Mar, MD,  FACP, [facility], [facility], [facility]   [facility] Advisor [place]     To contact the attending provider between 7A-7P or the covering provider during after hours 7P-7A, please log into the web site [email or web address] and access using universal [place] password for that web site. If you do not have the password, please call the hospital operator.

## 2023-11-05 NOTE — Progress Notes (Signed)
 [facility] Liaison Note   Patients's husband contacted me and is interested in patient going to [facility] for care. Patient evaluated and approved for GIP level of care at [facility].   Our social worker will contact her husband to facilitate consent for treatment at [facility].   Inpatient care team notified and liaisons will follow.    Please do not hesitate to call with questions.    Elouise Husband, BSN, RN, [facility] Liaison [phone]

## 2023-11-10 ENCOUNTER — Encounter: Payer: Self-pay | Admitting: Internal Medicine

## 2023-11-13 ENCOUNTER — Telehealth: Payer: Self-pay | Admitting: Pulmonary Disease

## 2023-11-25 LAB — AEROBIC/ANAEROBIC CULTURE W GRAM STAIN (SURGICAL/DEEP WOUND): Gram Stain: NONE SEEN

## 2023-11-28 NOTE — Telephone Encounter (Signed)
Ashley Galloway notified I would pass this along to you

## 2023-11-28 NOTE — Telephone Encounter (Signed)
Thank you for letting me know.  Please update patient chart to reflect deceased status and cancel all upcoming appointments and referrals

## 2023-11-28 NOTE — Telephone Encounter (Signed)
Ashley Galloway states patient passed away 11/24/23.

## 2023-11-28 DEATH — deceased

## 2023-12-03 LAB — MISCELLANEOUS TEST
